# Patient Record
Sex: Male | Born: 1965
Health system: Southern US, Community
[De-identification: ages and names within clinical notes are randomized; demographics above are authoritative.]

## PROBLEM LIST (undated history)

## (undated) ENCOUNTER — Emergency Department (HOSPITAL_COMMUNITY): Admission: EM | Payer: Federal, State, Local not specified - PPO | Source: Home / Self Care

## (undated) DIAGNOSIS — E785 Hyperlipidemia, unspecified: Secondary | ICD-10-CM

## (undated) DIAGNOSIS — G4730 Sleep apnea, unspecified: Secondary | ICD-10-CM

## (undated) DIAGNOSIS — N529 Male erectile dysfunction, unspecified: Secondary | ICD-10-CM

## (undated) DIAGNOSIS — I10 Essential (primary) hypertension: Secondary | ICD-10-CM

## (undated) DIAGNOSIS — M1990 Unspecified osteoarthritis, unspecified site: Secondary | ICD-10-CM

## (undated) HISTORY — PX: BACK SURGERY: SHX140

## (undated) HISTORY — PX: HERNIA REPAIR: SHX51

## (undated) HISTORY — DX: Hyperlipidemia, unspecified: E78.5

## (undated) HISTORY — PX: OTHER SURGICAL HISTORY: SHX169

## (undated) HISTORY — PX: LUMBAR MICRODISCECTOMY: SHX99

## (undated) HISTORY — DX: Sleep apnea, unspecified: G47.30

## (undated) HISTORY — PX: POLYPECTOMY: SHX149

## (undated) HISTORY — PX: COLONOSCOPY: SHX174

## (undated) HISTORY — DX: Male erectile dysfunction, unspecified: N52.9

## (undated) HISTORY — PX: UMBILICAL HERNIA REPAIR: SHX196

## (undated) HISTORY — DX: Essential (primary) hypertension: I10

## (undated) HISTORY — PX: FRACTURE SURGERY: SHX138

---

## 1999-01-31 ENCOUNTER — Emergency Department (HOSPITAL_COMMUNITY): Admission: EM | Admit: 1999-01-31 | Discharge: 1999-01-31 | Payer: Self-pay | Admitting: Emergency Medicine

## 1999-05-27 ENCOUNTER — Encounter: Payer: Self-pay | Admitting: Neurosurgery

## 1999-05-27 ENCOUNTER — Ambulatory Visit (HOSPITAL_COMMUNITY): Admission: RE | Admit: 1999-05-27 | Discharge: 1999-05-27 | Payer: Self-pay | Admitting: Neurosurgery

## 2000-01-10 ENCOUNTER — Emergency Department (HOSPITAL_COMMUNITY): Admission: EM | Admit: 2000-01-10 | Discharge: 2000-01-10 | Payer: Self-pay | Admitting: Internal Medicine

## 2000-04-16 ENCOUNTER — Encounter (INDEPENDENT_AMBULATORY_CARE_PROVIDER_SITE_OTHER): Payer: Self-pay

## 2000-04-16 ENCOUNTER — Other Ambulatory Visit: Admission: RE | Admit: 2000-04-16 | Discharge: 2000-04-16 | Payer: Self-pay | Admitting: Urology

## 2000-05-11 ENCOUNTER — Emergency Department (HOSPITAL_COMMUNITY): Admission: EM | Admit: 2000-05-11 | Discharge: 2000-05-11 | Payer: Self-pay | Admitting: Emergency Medicine

## 2001-07-04 ENCOUNTER — Encounter: Admission: RE | Admit: 2001-07-04 | Discharge: 2001-07-04 | Payer: Self-pay | Admitting: Orthopaedic Surgery

## 2001-07-18 ENCOUNTER — Encounter: Admission: RE | Admit: 2001-07-18 | Discharge: 2001-07-18 | Payer: Self-pay | Admitting: Orthopaedic Surgery

## 2001-09-30 ENCOUNTER — Encounter: Payer: Self-pay | Admitting: Emergency Medicine

## 2001-09-30 ENCOUNTER — Emergency Department (HOSPITAL_COMMUNITY): Admission: EM | Admit: 2001-09-30 | Discharge: 2001-10-01 | Payer: Self-pay | Admitting: Emergency Medicine

## 2003-08-09 ENCOUNTER — Emergency Department (HOSPITAL_COMMUNITY): Admission: AD | Admit: 2003-08-09 | Discharge: 2003-08-09 | Payer: Self-pay | Admitting: Family Medicine

## 2003-08-23 ENCOUNTER — Emergency Department (HOSPITAL_COMMUNITY): Admission: AD | Admit: 2003-08-23 | Discharge: 2003-08-23 | Payer: Self-pay | Admitting: Family Medicine

## 2004-04-04 ENCOUNTER — Inpatient Hospital Stay (HOSPITAL_COMMUNITY): Admission: RE | Admit: 2004-04-04 | Discharge: 2004-04-07 | Payer: Self-pay | Admitting: Orthopaedic Surgery

## 2004-06-01 HISTORY — PX: KNEE ARTHROSCOPY: SHX127

## 2004-09-09 ENCOUNTER — Ambulatory Visit (HOSPITAL_BASED_OUTPATIENT_CLINIC_OR_DEPARTMENT_OTHER): Admission: RE | Admit: 2004-09-09 | Discharge: 2004-09-09 | Payer: Self-pay | Admitting: Otolaryngology

## 2004-09-14 ENCOUNTER — Ambulatory Visit: Payer: Self-pay | Admitting: Internal Medicine

## 2005-03-21 ENCOUNTER — Emergency Department (HOSPITAL_COMMUNITY): Admission: EM | Admit: 2005-03-21 | Discharge: 2005-03-22 | Payer: Self-pay | Admitting: Emergency Medicine

## 2008-10-01 ENCOUNTER — Encounter: Admission: RE | Admit: 2008-10-01 | Discharge: 2008-10-01 | Payer: Self-pay | Admitting: Family Medicine

## 2009-03-04 ENCOUNTER — Ambulatory Visit: Payer: Self-pay | Admitting: Family Medicine

## 2010-06-04 ENCOUNTER — Ambulatory Visit
Admission: RE | Admit: 2010-06-04 | Discharge: 2010-06-04 | Payer: Self-pay | Source: Home / Self Care | Attending: Family Medicine | Admitting: Family Medicine

## 2010-06-22 ENCOUNTER — Encounter: Payer: Self-pay | Admitting: Family Medicine

## 2010-10-17 NOTE — Op Note (Signed)
Corey [name], Corey [name]                ACCOUNT NO.:  [id number]   MEDICAL RECORD NO.:  [id number]          PATIENT TYPE:  INP   LOCATION:  5038                         FACILITY:  [facility]   PHYSICIAN:  Mark C. Ophelia Charter, M.D.    DATE OF BIRTH:  [birth year]   DATE OF PROCEDURE:  04/04/2004  DATE OF DISCHARGE:                                 OPERATIVE REPORT   PREOPERATIVE DIAGNOSIS:  Recurrent left L5-S1 herniated nucleus pulposus  with spondylosis.   POSTOPERATIVE DIAGNOSIS:  Recurrent left L5-S1 herniated nucleus pulposus  with spondylosis.   OPERATIVE PROCEDURES:  L5-S1 recurrent microdiskectomy, bilateral  foraminotomy, laminectomy, and decompression;  left transverse lumbar  interbody fusion with 10-mm spacer, autograft iliac crest, allograft, and  pedicle screw fixation.   SURGEON:  Mark C. Ophelia Charter, M.D.   ASSISTANT:  __________, P.A.-C.   ANESTHESIA:  GOT.   ESTIMATED BLOOD LOSS:  1000 cc.   PROCEDURE:  After the induction of general anesthesia, the patient was  placed prone on chest rolls.  Preoperative Ancef was given at the beginning  of the case and was redosed at the end of the case after five hours of  surgery.  The back was prepped with DuraPrep.  The area was covered with  towels.  A laminectomy sheet was applied after Betadine and a Vi-Drape.  A  midline incision was made incorporating the old scar from the previous HNP.  The patient had a disk space that was labeled S1-S2, and this was then disk  space below the iliac crest on the lateral where the patient had previous  disk herniation.  Subperiosteal dissection was used, and once x-rays were  taken, cross-table lateral, confirming the appropriate level, decompression  was performed. The dura was carefully protected.  There were moderate-to-  severe adhesions in the lateral gutter and anteriorly.  There were disk  fragments present which appeared old and were fibrosed to the dura.  The  dura was separated away.  Bone was  removed at the level of the pedicles  bilaterally.  Nerve root above was identified and followed out the foramina  excising a portion of the facet to expose the lateral disk.  The nerve root  retractor was used.  The operating microscope was used for the visualization  with use of the 8, 9, and then 10-mm spinners, angled curettes, straight  curettes, multiple size pituitaries, wide, with up and downbiters.  Anterior  annulus was intact.  Once the trial was able to be inserted, a 10, and  checked with the cross-table lateral fluoroscopy, the right iliac crest was  exposed through a separate incision, and 10-mm bone was harvested,  tricortical, trimmed down slightly, and then impacted placed anteriorly.  The spacer was then packed with some Symphony bone graft which had been  mixed with patient's autograft from the lamina and partial facet removal.  Once the bone was intermixed, it was packed into the spacer.  The spacer was  then inserted dorsal to the large piece of bone.  Patties were used to  carefully protect the dura, and nerve root was  intact after the spacer had  been introduced.  Hemostasis was maintained.  Pedicle screws were then  inserted checking AP and lateral fluoroscopy for appropriate position.  On  the right lateral gutter, transverse processes were freshened with the  rongeur. Bone on the upsweep coming up to the lateral aspect of the pedicle  was freshened up, and then the log of bone graft was packed and mixed, stuck  down laterally, with the bone chips then packed in behind it tightly with  bone impactor.  After repeat irrigation, care was taken to make sure there  was no bone graft in the midline on the dura.  Several small pieces were  removed.  Fascia was then closed with 0 Vicryl.  A Hemovac was placed  through a separate stab incision draining both the iliac crest wound and  also the midline wound subcutaneously.  Then ,2-0 Vicryl was used in the  subcutaneous  tissue.  Iliac crest fascia was closed with 0 Vicryl and 2-0  Vicryl, and the skin with stapled closure.  Marcaine was infiltrated into  the skin.  A postoperative dressing was applied.  The patient was  neurologically intact in the recovery room.  The patient was hemodynamically  stable.       MCY/MEDQ  D:  04/04/2004  T:  04/05/2004  Job:  [id number]

## 2010-10-17 NOTE — Discharge Summary (Signed)
Corey [name], Corey [name]                ACCOUNT NO.:  [id number]   MEDICAL RECORD NO.:  [id number]          PATIENT TYPE:  INP   LOCATION:  5038                         FACILITY:  [facility]   PHYSICIAN:  Sandrea Matte, P.A.    DATE OF BIRTH:  Feb 12, 1966   DATE OF ADMISSION:  04/04/2004  DATE OF DISCHARGE:  04/07/2004                                 DISCHARGE SUMMARY   ADMISSION DIAGNOSIS:  Recurrent L5-S1 HNP.   HISTORY OF PRESENT ILLNESS:  [name] is a 45 year old male with prolonged  significant history of lower back pain.  He underwent a left L5-S1  microdiscectomy back in July 1998.  He has been well after the surgery until  recently.  He started to experience increasing bilateral lower extremity  pain and paresthesias.  He is status post epidural steroid injections  without any significant relief.  Due to the recurrence of his H&P, an L5-S1  decompression left microdiscectomy and TLIF procedure were talked about and  agreed upon.   SPECIAL PROCEDURE:  The patient underwent an L5-S1 decompression left  microdiscectomy TLIF procedure with pedicle screws, iliac crest bone  grafting and allograft on April 04, 2004.  Surgeon Annell Greening, M.D.,  assistant Sandrea Matte, P.A.-C.  The patient did tolerated this procedure  well.  There were no complications.  Please see Op Note for findings.   HOSPITAL COURSE:  The patient was admitted April 04, 2004, and underwent  an L5-S1 decompression left microdiscectomy and TLIF procedure.  This was  tolerated well.  Please see Op Note for findings.  On April 05, 2004, the  patient denied any weakness down his legs and states that the radicular  symptoms had improved.  The patient was complaining of nausea and vomiting  overnight and was given Zofran.  Labs today were as follows:  Hemoglobin  11.3, hematocrit 33.5.  Physical therapy was started today.  The patient was  helped out of bed to chair.  On April 06, 2004, the patient's nausea had  improved.  The patient states he was feeling a lot more comfortable today  and had been ambulating some with assistance.  Labs were hemoglobin 10.6,  hematocrit 31.3.  Dressing has been changed.  He did have some minimal  drainage from his incision.  His Hemovac drain was discontinued.  The  patient's lumbar corset was applied.  On April 07, 2004, the patient had  progressed very well.  He had been walking in therapy, has done stairs, and  ambulates well with a walker.  Therapy has discharged him from service due  to his success.  The patient states his pain was controlled well.  His  nausea had improved.  His leg pain had also improved.  Last hemoglobin 9.6,  hematocrit 28.6.  The patient was discharged home today in stable condition.   DISCHARGE MEDICATIONS:  1.  OxyContin 20 mg p.o. q.12h.  2.  Tylox 1-2 tablets p.o. q.4-6h. p.r.n.  3.  Phenergan 25 mg p.o. q.6h. p.r.n. nausea.  4.  Colace 100 mg p.o. b.i.d.  5.  Robaxin 500 mg one tablet  p.o. q.6-8h. p.r.n. spasms.   DISCHARGE INSTRUCTIONS:  The patient will be going home with a rolling  walker with 5 inch wheels.  He is to wear his corset while at home, keep his  incision dry.  He is to keep walking daily, avoid any heavy lifting,  bending, stooping and prolonged periods of sitting.  He will follow up with  Dr. Ophelia Charter in one week.   CONDITION ON DISCHARGE:  Stable.   DISPOSITION:  To home.   DISCHARGE DIAGNOSES:  Status post L5-S1decompression left microdiscectomy  and TLIF procedure using pedicle screws, iliac crest bone grafting and  Allograft.       JH/MEDQ  D:  05/19/2004  T:  05/20/2004  Job:  [id number]

## 2010-10-17 NOTE — Procedures (Signed)
[name], Corey [name]                ACCOUNT NO.:  [id number]   MEDICAL RECORD NO.:  [id number]          PATIENT TYPE:  OUT   LOCATION:  SLEEP CENTER                 FACILITY:  [facility]   PHYSICIAN:  Clinton D. Maple Hudson, M.D. DATE OF BIRTH:  13-Dec-1965   DATE OF STUDY:  09/09/2004                              NOCTURNAL POLYSOMNOGRAM   REFERRING PHYSICIAN:  Cristal Deer E. Ezzard Standing, M.D.   INDICATIONS FOR STUDY:  Hypersomnia with sleep apnea. Epworth sleepiness  score 12/24, BMI 30, weight 205 pounds.   SLEEP ARCHITECTURE:  Total sleep time 356 minutes with sleep efficiency of  94%. Stage I was 8%, stage II was 67%, stages III and IV were 7%, REM was  18% of total sleep time. Sleep latency was 9 minutes. REM latency 53  minutes. Awake after sleep onset 14 minutes. Arousal index 21. Medications  at bedtime included Simply Sleep, ibuprofen, and Darvocet.   RESPIRATORY DATA:  Respiratory disturbance index (RDI, AHI) 10.8 obstructive  events per hour indicating mild obstructive sleep apnea/hypopnea syndrome.  This included 2 central apneas, 2 obstructive apneas, and 60 hypopneas.  Events were not positional. REM RDI 28.  He had insufficient early events to  prevent use of CPAP titration by protocol on this study night.   OXYGEN DATA:  Moderate snoring with oxygen desaturation to a nadir of 90%.  Mean oxygen saturation through the study was 98% on room air.   CARDIAC DATA:  Normal sinus rhythm.   MOVEMENT/PARASOMNIA:  A total of limb jerks were recorded of which 15 were  associated with arousal or awakening for periodic limb movement with arousal  index of 2.5 per hour which was increased.   IMPRESSION/RECOMMENDATIONS:  1.  Mild obstructive sleep apnea/hypopnea syndrome, RDI 10.2 per hour with      moderate snoring and oxygen desaturation to 90%.  2.  Mild periodic limb movement with arousal, 2.5 per hour.  3.  CPAP titration can be considered if there is clinical evidence of      daytime  hypersomnia, hypertension, or other symptoms directly      attributable to sleep apnea. Can also consider alternative therapeutic      options.      CDY/MEDQ  D:  09/14/2004 16:11:23  T:  09/14/2004 20:35:07  Job:  [id number]

## 2010-12-25 ENCOUNTER — Other Ambulatory Visit: Payer: Self-pay | Admitting: Family Medicine

## 2010-12-25 NOTE — Telephone Encounter (Signed)
DR.LALONDE ALREADY RENEWED THIS

## 2010-12-25 NOTE — Telephone Encounter (Signed)
Is this ok?

## 2011-01-19 ENCOUNTER — Encounter: Payer: Self-pay | Admitting: Family Medicine

## 2011-01-21 ENCOUNTER — Encounter: Payer: Federal, State, Local not specified - PPO | Admitting: Family Medicine

## 2011-06-25 ENCOUNTER — Other Ambulatory Visit: Payer: Self-pay | Admitting: Family Medicine

## 2011-06-25 NOTE — Telephone Encounter (Signed)
Pt needs to call office and schedule appt for further refills.

## 2011-07-27 ENCOUNTER — Other Ambulatory Visit: Payer: Self-pay | Admitting: Family Medicine

## 2011-07-27 NOTE — Telephone Encounter (Signed)
Is this ok?

## 2011-08-19 ENCOUNTER — Encounter: Payer: Self-pay | Admitting: Family Medicine

## 2011-08-19 ENCOUNTER — Ambulatory Visit (INDEPENDENT_AMBULATORY_CARE_PROVIDER_SITE_OTHER): Payer: Federal, State, Local not specified - PPO | Admitting: Family Medicine

## 2011-08-19 VITALS — BP 120/84 | HR 98 | Ht 69.0 in | Wt 201.0 lb

## 2011-08-19 DIAGNOSIS — Z0000 Encounter for general adult medical examination without abnormal findings: Secondary | ICD-10-CM

## 2011-08-19 DIAGNOSIS — F524 Premature ejaculation: Secondary | ICD-10-CM

## 2011-08-19 LAB — COMPREHENSIVE METABOLIC PANEL
ALT: 24 U/L (ref 0–53)
Alkaline Phosphatase: 58 U/L (ref 39–117)
BUN: 15 mg/dL (ref 6–23)
CO2: 25 mEq/L (ref 19–32)
Calcium: 9.2 mg/dL (ref 8.4–10.5)
Chloride: 103 mEq/L (ref 96–112)
Creat: 1.33 mg/dL (ref 0.50–1.35)
Total Bilirubin: 0.6 mg/dL (ref 0.3–1.2)

## 2011-08-19 LAB — CBC WITH DIFFERENTIAL/PLATELET
Basophils Relative: 0 % (ref 0–1)
Eosinophils Absolute: 0.2 10*3/uL (ref 0.0–0.7)
Eosinophils Relative: 3 % (ref 0–5)
Hemoglobin: 14 g/dL (ref 13.0–17.0)
Lymphocytes Relative: 48 % — ABNORMAL HIGH (ref 12–46)
Lymphs Abs: 3.3 10*3/uL (ref 0.7–4.0)
MCH: 27.7 pg (ref 26.0–34.0)
MCHC: 32 g/dL (ref 30.0–36.0)
MCV: 86.5 fL (ref 78.0–100.0)
Monocytes Absolute: 0.3 10*3/uL (ref 0.1–1.0)
Monocytes Relative: 4 % (ref 3–12)
Neutrophils Relative %: 45 % (ref 43–77)
Platelets: 287 10*3/uL (ref 150–400)
RBC: 5.05 MIL/uL (ref 4.22–5.81)
RDW: 14.2 % (ref 11.5–15.5)
WBC: 7 10*3/uL (ref 4.0–10.5)

## 2011-08-19 LAB — HEMOCCULT GUIAC POC 1CARD (OFFICE)

## 2011-08-19 LAB — LIPID PANEL
Cholesterol: 203 mg/dL — ABNORMAL HIGH (ref 0–200)
Total CHOL/HDL Ratio: 4.3 Ratio
Triglycerides: 81 mg/dL (ref <150)
VLDL: 16 mg/dL (ref 0–40)

## 2011-08-19 MED ORDER — PAROXETINE HCL 20 MG PO TABS: 10.0000 mg | ORAL_TABLET | ORAL | Status: DC

## 2011-08-19 NOTE — Progress Notes (Signed)
  Subjective:    Patient ID: Corey [name], male    DOB: [birth year], 46 y.o.   MRN: [id number]  HPI He is here for complete examination. He does have intermittent difficulty with knee pain but is able to continue to work. Occasionally does swell. He also continues to use Paxil for premature ejaculation. Otherwise he has no particular concerns or complaints. He is not married and presently is dating one person. His social and family history were reviewed.   Review of Systems  Constitutional: Negative.   HENT: Negative.   Eyes: Negative.   Respiratory: Negative.   Cardiovascular: Negative.   Gastrointestinal: Negative.   Genitourinary: Negative.   Musculoskeletal: Negative.   Skin: Negative.   Neurological: Negative.   Hematological: Negative.   Psychiatric/Behavioral: Negative.        Objective:   Physical Exam BP 120/84  Pulse 98  Ht 5\' 9"  (1.753 m)  Wt 201 lb (91.173 kg)  BMI 29.68 kg/m2  General Appearance:    Alert, cooperative, no distress, appears stated age  Head:    Normocephalic, without obvious abnormality, atraumatic  Eyes:    PERRL, conjunctiva/corneas clear, EOM's intact, fundi    benign  Ears:    Normal TM's and external ear canals  Nose:   Nares normal, mucosa normal, no drainage or sinus   tenderness  Throat:   Lips, mucosa, and tongue normal; teeth and gums normal  Neck:   Supple, no lymphadenopathy;  thyroid:  no   enlargement/tenderness/nodules; no carotid   bruit or JVD  Back:    Spine nontender, no curvature, ROM normal, no CVA     tenderness  Lungs:     Clear to auscultation bilaterally without wheezes, rales or     ronchi; respirations unlabored  Chest Wall:    No tenderness or deformity   Heart:    Regular rate and rhythm, S1 and S2 normal, no murmur, rub   or gallop  Breast Exam:    No chest wall tenderness, masses or gynecomastia  Abdomen:     Soft, non-tender, nondistended, normoactive bowel sounds,    no masses, no hepatosplenomegaly    Genitalia:    Normal male external genitalia without lesions.  Testicles without masses.  No inguinal hernias.  Rectal:    Normal sphincter tone, no masses or tenderness; guaiac negative stool.  Prostate smooth, no nodules, not enlarged.  Extremities:   No clubbing, cyanosis or edema  Pulses:   2+ and symmetric all extremities  Skin:   Skin color, texture, turgor normal, no rashes or lesions  Lymph nodes:   Cervical, supraclavicular, and axillary nodes normal  Neurologic:   CNII-XII intact, normal strength, sensation and gait; reflexes 2+ and symmetric throughout          Psych:   Normal mood, affect, hygiene and grooming.           Assessment & Plan:   1. Routine general medical examination at a health care facility  CBC with Differential, Comprehensive metabolic panel, Lipid panel, Hemoccult - 1 Card (office)  2. Premature ejaculation  PARoxetine (PAXIL) 20 MG tablet   Recommend he use Paxil on an as needed basis rather than on a daily basis.

## 2011-08-20 NOTE — Progress Notes (Signed)
Quick Note:  The blood work is normal ______ 

## 2012-08-22 ENCOUNTER — Other Ambulatory Visit: Payer: Self-pay | Admitting: Family Medicine

## 2012-08-26 ENCOUNTER — Ambulatory Visit (INDEPENDENT_AMBULATORY_CARE_PROVIDER_SITE_OTHER): Payer: Federal, State, Local not specified - PPO | Admitting: Family Medicine

## 2012-08-26 ENCOUNTER — Encounter: Payer: Self-pay | Admitting: Family Medicine

## 2012-08-26 VITALS — BP 110/70 | HR 60 | Ht 68.5 in | Wt 197.0 lb

## 2012-08-26 DIAGNOSIS — Z0000 Encounter for general adult medical examination without abnormal findings: Secondary | ICD-10-CM

## 2012-08-26 DIAGNOSIS — F524 Premature ejaculation: Secondary | ICD-10-CM

## 2012-08-26 LAB — POCT URINALYSIS DIPSTICK
Bilirubin, UA: NEGATIVE
Glucose, UA: NEGATIVE
Leukocytes, UA: NEGATIVE
Nitrite, UA: NEGATIVE

## 2012-08-26 LAB — HEMOCCULT GUIAC POC 1CARD (OFFICE)

## 2012-08-26 MED ORDER — PAROXETINE HCL 20 MG PO TABS: ORAL_TABLET | ORAL | Status: DC

## 2012-08-26 NOTE — Progress Notes (Signed)
  Subjective:    Patient ID: Corey [name], male    DOB: 29-Mar-1966, 47 y.o.   MRN: [id number]  HPI He is here for complete examination. He has no particular concerns or complaints. His past medical history is negative except for back surgery. He is having no difficulty with that. His work is going well. He is involved in a relationship but is not sexually active. He does have a history of premature ejaculation and uses Paxil for this.Social and family history were reviewed. He does exercise regularly.   Review of Systems  Constitutional: Negative.   HENT: Negative.   Respiratory: Negative.   Cardiovascular: Negative.   Endocrine: Negative.   Genitourinary: Negative.   Allergic/Immunologic: Negative.   Neurological: Negative.   Psychiatric/Behavioral: Negative.        Objective:   Physical Exam BP 110/70  Pulse 60  Ht 5' 8.5" (1.74 m)  Wt 197 lb (89.359 kg)  BMI 29.51 kg/m2  General Appearance:    Alert, cooperative, no distress, appears stated age  Head:    Normocephalic, without obvious abnormality, atraumatic  Eyes:    PERRL, conjunctiva/corneas clear, EOM's intact, fundi    benign  Ears:    Normal TM's and external ear canals  Nose:   Nares normal, mucosa normal, no drainage or sinus   tenderness  Throat:   Lips, mucosa, and tongue normal; teeth and gums normal  Neck:   Supple, no lymphadenopathy;  thyroid:  no   enlargement/tenderness/nodules; no carotid   bruit or JVD  Back:    Spine nontender, no curvature, ROM normal, no CVA     tenderness  Lungs:     Clear to auscultation bilaterally without wheezes, rales or     ronchi; respirations unlabored  Chest Wall:    No tenderness or deformity   Heart:    Regular rate and rhythm, S1 and S2 normal, no murmur, rub   or gallop  Breast Exam:    No chest wall tenderness, masses or gynecomastia  Abdomen:     Soft, non-tender, nondistended, normoactive bowel sounds,    no masses, no hepatosplenomegaly  Genitalia:    Normal  male external genitalia without lesions.  Testicles without masses.  No inguinal hernias.  Rectal:    Normal sphincter tone, no masses or tenderness; guaiac negative stool.  Prostate smooth, no nodules, not enlarged.  Extremities:   No clubbing, cyanosis or edema  Pulses:   2+ and symmetric all extremities  Skin:   Skin color, texture, turgor normal, no rashes or lesions.surgical scar noted in the lower lumbar area.  Lymph nodes:   Cervical, supraclavicular, and axillary nodes normal  Neurologic:   CNII-XII intact, normal strength, sensation and gait; reflexes 2+ and symmetric throughout          Psych:   Normal mood, affect, hygiene and grooming.          Assessment & Plan:  Routine general medical examination at a health care facility - Plan: POCT Urinalysis Dipstick, Lipid panel, Hemoccult - 1 Card (office)  Premature ejaculation - Plan: PARoxetine (PAXIL) 20 MG tablet encouraged him to continue to take good care of himself. Discussed his height and weight. Discussed the fact that it would probably be good for him to lose a couple of inches in his waist.

## 2012-08-26 NOTE — Progress Notes (Signed)
  Subjective:    Patient ID: Corey [name], male    DOB: 04-24-1966, 47 y.o.   MRN: [id number]  HPI    Review of Systems     Objective:   Physical Exam        Assessment & Plan:  Urine microscopic examination was negative

## 2012-08-27 LAB — LIPID PANEL
Cholesterol: 146 mg/dL (ref 0–200)
HDL: 40 mg/dL (ref >39)
LDL Cholesterol: 100 mg/dL — ABNORMAL HIGH (ref 0–99)
Total CHOL/HDL Ratio: 3.7 Ratio
Triglycerides: 31 mg/dL (ref <150)

## 2012-08-29 NOTE — Progress Notes (Signed)
Quick Note:  CALLED PT HOME # PT INFORMED LIPIDS LOOK GOOD ______

## 2013-08-28 ENCOUNTER — Ambulatory Visit (INDEPENDENT_AMBULATORY_CARE_PROVIDER_SITE_OTHER): Payer: Federal, State, Local not specified - PPO | Admitting: Family Medicine

## 2013-08-28 ENCOUNTER — Encounter: Payer: Self-pay | Admitting: Family Medicine

## 2013-08-28 VITALS — BP 124/80 | HR 68 | Ht 69.0 in | Wt 209.0 lb

## 2013-08-28 DIAGNOSIS — F524 Premature ejaculation: Secondary | ICD-10-CM

## 2013-08-28 DIAGNOSIS — Z209 Contact with and (suspected) exposure to unspecified communicable disease: Secondary | ICD-10-CM

## 2013-08-28 DIAGNOSIS — Z0000 Encounter for general adult medical examination without abnormal findings: Secondary | ICD-10-CM

## 2013-08-28 LAB — POCT URINALYSIS DIPSTICK
Bilirubin, UA: NEGATIVE
Blood, UA: NEGATIVE
GLUCOSE UA: NEGATIVE
KETONES UA: NEGATIVE
Leukocytes, UA: NEGATIVE
NITRITE UA: NEGATIVE
PH UA: 5
Protein, UA: NEGATIVE
Spec Grav, UA: 1.01
Urobilinogen, UA: NEGATIVE

## 2013-08-28 LAB — HEMOCCULT GUIAC POC 1CARD (OFFICE)

## 2013-08-28 LAB — CBC WITH DIFFERENTIAL/PLATELET
BASOS ABS: 0 10*3/uL (ref 0.0–0.1)
Basophils Relative: 1 % (ref 0–1)
Eosinophils Absolute: 0.2 10*3/uL (ref 0.0–0.7)
Eosinophils Relative: 5 % (ref 0–5)
HCT: 42.5 % (ref 39.0–52.0)
Hemoglobin: 13.8 g/dL (ref 13.0–17.0)
Lymphocytes Relative: 41 % (ref 12–46)
Lymphs Abs: 1.9 10*3/uL (ref 0.7–4.0)
MCH: 27.3 pg (ref 26.0–34.0)
MCHC: 32.5 g/dL (ref 30.0–36.0)
MCV: 84 fL (ref 78.0–100.0)
Monocytes Absolute: 0.2 10*3/uL (ref 0.1–1.0)
Monocytes Relative: 5 % (ref 3–12)
NEUTROS ABS: 2.2 10*3/uL (ref 1.7–7.7)
Neutrophils Relative %: 48 % (ref 43–77)
Platelets: 290 10*3/uL (ref 150–400)
RBC: 5.06 MIL/uL (ref 4.22–5.81)
RDW: 15 % (ref 11.5–15.5)
WBC: 4.6 10*3/uL (ref 4.0–10.5)

## 2013-08-28 MED ORDER — PAROXETINE HCL 20 MG PO TABS: ORAL_TABLET | ORAL | Status: DC

## 2013-08-28 NOTE — Progress Notes (Signed)
   Subjective:    Patient ID: Corey [name], male    DOB: 1965-12-22, 48 y.o.   MRN: [id number]  HPI He is here for complete examination. He has no particular concerns or complaints. He would like a refill on his piroxicam for treatment of premature ejaculation. He is dating someone and does use condoms but would like to be tested be safe. He works 6 or 7 days per week and does have a physically active job. He is fasting right now due to religious reasons. His weight was reviewed. Family and social history were reviewed.   Review of Systems  All other systems reviewed and are negative.       Objective:   Physical Exam BP 124/80  Pulse 68  Ht 5\' 9"  (1.753 m)  Wt 209 lb (94.802 kg)  BMI 30.85 kg/m2  General Appearance:    Alert, cooperative, no distress, appears stated age  Head:    Normocephalic, without obvious abnormality, atraumatic  Eyes:    PERRL, conjunctiva/corneas clear, EOM's intact, fundi    benign  Ears:    Normal TM's and external ear canals  Nose:   Nares normal, mucosa normal, no drainage or sinus   tenderness  Throat:   Lips, mucosa, and tongue normal; teeth and gums normal  Neck:   Supple, no lymphadenopathy;  thyroid:  no   enlargement/tenderness/nodules; no carotid   bruit or JVD  Back:    Spine nontender, no curvature, ROM normal, no CVA     tenderness  Lungs:     Clear to auscultation bilaterally without wheezes, rales or     ronchi; respirations unlabored  Chest Wall:    No tenderness or deformity   Heart:    Regular rate and rhythm, S1 and S2 normal, no murmur, rub   or gallop  Breast Exam:    No chest wall tenderness, masses or gynecomastia  Abdomen:     Soft, non-tender, nondistended, normoactive bowel sounds,    no masses, no hepatosplenomegaly  Genitalia:    Normal male external genitalia without lesions.  Testicles without masses.  No inguinal hernias.  Rectal:    Normal sphincter tone, no masses or tenderness; guaiac negative stool.  Prostate  smooth, no nodules, not enlarged.  Extremities:   No clubbing, cyanosis or edema  Pulses:   2+ and symmetric all extremities  Skin:   Skin color, texture, turgor normal, no rashes or lesions  Lymph nodes:   Cervical, supraclavicular, and axillary nodes normal  Neurologic:   CNII-XII intact, normal strength, sensation and gait; reflexes 2+ and symmetric throughout          Psych:   Normal mood, affect, hygiene and grooming.          Assessment & Plan:  Routine general medical examination at a health care facility - Plan: Urinalysis Dipstick, CBC with Differential, Comprehensive metabolic panel, Lipid panel, POCT occult blood stool  Premature ejaculation - Plan: PARoxetine (PAXIL) 20 MG tablet  Contact with or exposure to unspecified communicable disease - Plan: RPR, HIV antibody, GC/chlamydia probe amp, urine I encouraged him to cut back on carbohydrates. His job keeps him physically active.

## 2013-08-28 NOTE — Patient Instructions (Signed)
Cut back on carbs. The easiest way to remember it is if its white, you need to back off

## 2013-08-29 LAB — COMPREHENSIVE METABOLIC PANEL
ALBUMIN: 3.9 g/dL (ref 3.5–5.2)
ALT: 24 U/L (ref 0–53)
AST: 25 U/L (ref 0–37)
Alkaline Phosphatase: 59 U/L (ref 39–117)
BUN: 23 mg/dL (ref 6–23)
CALCIUM: 8.8 mg/dL (ref 8.4–10.5)
CO2: 24 mEq/L (ref 19–32)
Chloride: 104 mEq/L (ref 96–112)
Creat: 1.21 mg/dL (ref 0.50–1.35)
Glucose, Bld: 92 mg/dL (ref 70–99)
POTASSIUM: 4.4 meq/L (ref 3.5–5.3)
Sodium: 139 mEq/L (ref 135–145)
Total Bilirubin: 0.3 mg/dL (ref 0.2–1.2)
Total Protein: 7.1 g/dL (ref 6.0–8.3)

## 2013-08-29 LAB — LIPID PANEL
CHOLESTEROL: 178 mg/dL (ref 0–200)
HDL: 57 mg/dL (ref >39)
LDL Cholesterol: 108 mg/dL — ABNORMAL HIGH (ref 0–99)
Total CHOL/HDL Ratio: 3.1 Ratio
Triglycerides: 66 mg/dL (ref <150)
VLDL: 13 mg/dL (ref 0–40)

## 2013-08-29 LAB — GC/CHLAMYDIA PROBE AMP, URINE
CHLAMYDIA, SWAB/URINE, PCR: NEGATIVE
GC PROBE AMP, URINE: NEGATIVE

## 2013-08-29 LAB — HIV ANTIBODY (ROUTINE TESTING W REFLEX): HIV: NONREACTIVE

## 2013-08-29 LAB — RPR

## 2014-01-16 ENCOUNTER — Telehealth: Payer: Self-pay | Admitting: Internal Medicine

## 2014-01-16 NOTE — Telephone Encounter (Signed)
Faxed over medical records to [facility] on August 14th @ [phone]

## 2014-07-24 ENCOUNTER — Other Ambulatory Visit: Payer: Self-pay | Admitting: Family Medicine

## 2014-07-24 NOTE — Telephone Encounter (Signed)
Is this okay?

## 2014-08-30 ENCOUNTER — Encounter: Payer: Federal, State, Local not specified - PPO | Admitting: Family Medicine

## 2014-09-07 ENCOUNTER — Encounter: Payer: Self-pay | Admitting: Family Medicine

## 2014-09-07 ENCOUNTER — Ambulatory Visit (INDEPENDENT_AMBULATORY_CARE_PROVIDER_SITE_OTHER): Payer: 59 | Admitting: Family Medicine

## 2014-09-07 VITALS — BP 140/90 | HR 81 | Ht 69.0 in | Wt 225.6 lb

## 2014-09-07 DIAGNOSIS — Z0000 Encounter for general adult medical examination without abnormal findings: Secondary | ICD-10-CM | POA: Diagnosis not present

## 2014-09-07 DIAGNOSIS — F524 Premature ejaculation: Secondary | ICD-10-CM | POA: Diagnosis not present

## 2014-09-07 DIAGNOSIS — IMO0001 Reserved for inherently not codable concepts without codable children: Secondary | ICD-10-CM

## 2014-09-07 DIAGNOSIS — E669 Obesity, unspecified: Secondary | ICD-10-CM

## 2014-09-07 DIAGNOSIS — R030 Elevated blood-pressure reading, without diagnosis of hypertension: Secondary | ICD-10-CM

## 2014-09-07 LAB — CBC WITH DIFFERENTIAL/PLATELET
Basophils Absolute: 0.1 10*3/uL (ref 0.0–0.1)
Basophils Relative: 1 % (ref 0–1)
Eosinophils Absolute: 0.2 10*3/uL (ref 0.0–0.7)
Eosinophils Relative: 3 % (ref 0–5)
HEMATOCRIT: 42.5 % (ref 39.0–52.0)
Hemoglobin: 13.9 g/dL (ref 13.0–17.0)
Lymphocytes Relative: 46 % (ref 12–46)
Lymphs Abs: 2.8 10*3/uL (ref 0.7–4.0)
MCH: 27.5 pg (ref 26.0–34.0)
MCHC: 32.7 g/dL (ref 30.0–36.0)
MCV: 84.2 fL (ref 78.0–100.0)
MONO ABS: 0.4 10*3/uL (ref 0.1–1.0)
MPV: 9.1 fL (ref 8.6–12.4)
Monocytes Relative: 7 % (ref 3–12)
NEUTROS ABS: 2.6 10*3/uL (ref 1.7–7.7)
NEUTROS PCT: 43 % (ref 43–77)
PLATELETS: 283 10*3/uL (ref 150–400)
RBC: 5.05 MIL/uL (ref 4.22–5.81)
RDW: 14.5 % (ref 11.5–15.5)
WBC: 6.1 10*3/uL (ref 4.0–10.5)

## 2014-09-07 LAB — COMPREHENSIVE METABOLIC PANEL
ALT: 22 U/L (ref 0–53)
AST: 20 U/L (ref 0–37)
Albumin: 4.2 g/dL (ref 3.5–5.2)
Alkaline Phosphatase: 53 U/L (ref 39–117)
BUN: 12 mg/dL (ref 6–23)
CO2: 29 mEq/L (ref 19–32)
Calcium: 9.5 mg/dL (ref 8.4–10.5)
Chloride: 101 mEq/L (ref 96–112)
Creat: 1.34 mg/dL (ref 0.50–1.35)
Glucose, Bld: 85 mg/dL (ref 70–99)
Potassium: 4 mEq/L (ref 3.5–5.3)
Sodium: 139 mEq/L (ref 135–145)
TOTAL PROTEIN: 7.4 g/dL (ref 6.0–8.3)
Total Bilirubin: 0.5 mg/dL (ref 0.2–1.2)

## 2014-09-07 LAB — LIPID PANEL
Cholesterol: 226 mg/dL — ABNORMAL HIGH (ref 0–200)
HDL: 39 mg/dL — ABNORMAL LOW (ref >=40)
LDL CALC: 152 mg/dL — AB (ref 0–99)
Total CHOL/HDL Ratio: 5.8 Ratio
Triglycerides: 173 mg/dL — ABNORMAL HIGH (ref <150)
VLDL: 35 mg/dL (ref 0–40)

## 2014-09-07 LAB — POCT URINALYSIS DIPSTICK
Bilirubin, UA: NEGATIVE
Blood, UA: NEGATIVE
Glucose, UA: NEGATIVE
Ketones, UA: NEGATIVE
Leukocytes, UA: NEGATIVE
Nitrite, UA: NEGATIVE
Protein, UA: NEGATIVE
Urobilinogen, UA: NEGATIVE
pH, UA: 6

## 2014-09-07 MED ORDER — PAROXETINE HCL 20 MG PO TABS: ORAL_TABLET | ORAL | Status: DC

## 2014-09-07 NOTE — Patient Instructions (Addendum)
150 minutes a week of something physical. Cut back on white food Get down to at least a waist size 36

## 2014-09-07 NOTE — Progress Notes (Signed)
   Subjective:    Patient ID: Corey [name], male    DOB: 1966-04-05, 49 y.o.   MRN: [id number]  HPI He is here for complete examination. He continues to have difficulty with premature ejaculation and would like to go to the full 20 mg dosing of the Paxil. He has noted an increase in his weight. He is now taking care of his mother with the help of a brother. She apparently is dealing with Alzheimer's disease and requires regular care. His weight has suffered because of this. He has no other concerns or complaints. He has no chest pain, abdominal distress, allergy symptoms. Family and social history was reviewed. His immunizations and health maintenance was also reviewed.   Review of Systems  All other systems reviewed and are negative.      Objective:   Physical Exam BP 140/90 mmHg  Pulse 81  Ht 5\' 9"  (1.753 m)  Wt 225 lb 9.6 oz (102.331 kg)  BMI 33.30 kg/m2  General Appearance:    Alert, cooperative, no distress, appears stated age  Head:    Normocephalic, without obvious abnormality, atraumatic  Eyes:    PERRL, conjunctiva/corneas clear, EOM's intact, fundi    benign  Ears:    Normal TM's and external ear canals  Nose:   Nares normal, mucosa normal, no drainage or sinus   tenderness  Throat:   Lips, mucosa, and tongue normal; teeth and gums normal  Neck:   Supple, no lymphadenopathy;  thyroid:  no   enlargement/tenderness/nodules; no carotid   bruit or JVD  Back:    Spine nontender, no curvature, ROM normal, no CVA     tenderness  Lungs:     Clear to auscultation bilaterally without wheezes, rales or     ronchi; respirations unlabored  Chest Wall:    No tenderness or deformity   Heart:    Regular rate and rhythm, S1 and S2 normal, no murmur, rub   or gallop  Breast Exam:    No chest wall tenderness, masses or gynecomastia  Abdomen:     Soft, non-tender, nondistended, normoactive bowel sounds,    no masses, no hepatosplenomegaly     Rectal:  deferred  Extremities:   No  clubbing, cyanosis or edema  Pulses:   2+ and symmetric all extremities  Skin:   Skin color, texture, turgor normal, no rashes or lesions  Lymph nodes:   Cervical, supraclavicular, and axillary nodes normal  Neurologic:   CNII-XII intact, normal strength, sensation and gait; reflexes 2+ and symmetric throughout          Psych:   Normal mood, affect, hygiene and grooming.          Assessment & Plan:  Routine general medical examination at a health care facility - Plan: POCT urinalysis dipstick, CBC with Differential/Platelet, Comprehensive metabolic panel, Lipid panel  Premature ejaculation - Plan: PARoxetine (PAXIL) 20 MG tablet  Obesity  Elevated blood pressure - Plan: CBC with Differential/Platelet, Comprehensive metabolic panel, Lipid panel discussed diet and exercise with him in regard to his blood pressure and his weight. Encouraged him to make further diet and exercise changes. Discussed cutting back on carbohydrates and increasing his physical activity to 150 minutes per week. He will also be given stool cards.

## 2014-09-20 ENCOUNTER — Other Ambulatory Visit (INDEPENDENT_AMBULATORY_CARE_PROVIDER_SITE_OTHER): Payer: 59

## 2014-09-20 DIAGNOSIS — Z0000 Encounter for general adult medical examination without abnormal findings: Secondary | ICD-10-CM

## 2014-09-20 DIAGNOSIS — R195 Other fecal abnormalities: Secondary | ICD-10-CM

## 2014-09-20 LAB — POC HEMOCCULT BLD/STL (HOME/3-CARD/SCREEN)
Card #2 Fecal Occult Blod, POC: NEGATIVE
Card #3 Fecal Occult Blood, POC: NEGATIVE
Fecal Occult Blood, POC: POSITIVE

## 2014-09-21 ENCOUNTER — Encounter: Payer: Self-pay | Admitting: Internal Medicine

## 2014-11-15 ENCOUNTER — Encounter (INDEPENDENT_AMBULATORY_CARE_PROVIDER_SITE_OTHER): Payer: Self-pay

## 2014-11-15 ENCOUNTER — Encounter: Payer: Self-pay | Admitting: Internal Medicine

## 2014-11-15 ENCOUNTER — Ambulatory Visit (INDEPENDENT_AMBULATORY_CARE_PROVIDER_SITE_OTHER): Payer: 59 | Admitting: Internal Medicine

## 2014-11-15 VITALS — BP 110/78 | HR 88 | Ht 67.75 in | Wt 231.0 lb

## 2014-11-15 DIAGNOSIS — R195 Other fecal abnormalities: Secondary | ICD-10-CM | POA: Diagnosis not present

## 2014-11-15 MED ORDER — NA SULFATE-K SULFATE-MG SULF 17.5-3.13-1.6 GM/177ML PO SOLN: ORAL | Status: DC

## 2014-11-15 NOTE — Patient Instructions (Signed)

## 2014-11-15 NOTE — Progress Notes (Signed)
Patient ID: Corey [name], male   DOB: 1966/05/13, 49 y.o.   MRN: [id number] HPI: Corey [name] is a 49 year old male with little past medical history who is seen in consultation at the request of Dr. Redmond School to evaluate heme positive stool. He is here alone today. He reports he is feeling well and denies specific GI complaint. He reports his bowel movements have been regular without diarrhea or constipation. He denies seeing visible blood with wiping or in his stool. No melena. Stable weight. Good appetite. Denies heartburn, dysphagia and odynophagia. No early satiety. Weight is stable to slightly increased. No hepatobiliary complaint. He denies a family history of GI tract malignancy, IBD and liver disease. He works for the [employer].  He had a recent annual physical and FOBT cards were returned one of which was positive  Past Medical History  Diagnosis Date  . Hyperlipidemia   . ED (erectile dysfunction)   . Anxiety     Past Surgical History  Procedure Laterality Date  . Lumbar microdiscectomy    . Knee arthroscopy Left 2006  . Umbilical hernia repair      Outpatient Prescriptions Prior to Visit  Medication Sig Dispense Refill  . Multiple Vitamins-Minerals (MULTIVITAMIN WITH MINERALS) tablet Take 1 tablet by mouth daily.    Marland Kitchen PARoxetine (PAXIL) 20 MG tablet TAKE 0.5 TABLETS (10 MG TOTAL) BY MOUTH EVERY MORNING. 90 tablet 3   No facility-administered medications prior to visit.    Allergies  Allergen Reactions  . Penicillins Other (See Comments)    Makes pt black out    Family History  Problem Relation Age of Onset  . Alzheimer's disease Mother   . Autism Brother   . Stevens-Johnson syndrome Father     History  Substance Use Topics  . Smoking status: Never Smoker   . Smokeless tobacco: Never Used  . Alcohol Use: 1.8 oz/week    3 Glasses of wine per week    ROS: As per history of present illness, otherwise negative  BP 110/78 mmHg  Pulse 88  Ht 5' 7.75" (1.721 m)   Wt 231 lb (104.781 kg)  BMI 35.38 kg/m2 Constitutional: Well-developed and well-nourished. No distress. HEENT: Normocephalic and atraumatic.  Conjunctivae are normal.  No scleral icterus. Neck: Neck supple. Trachea midline. Cardiovascular: Normal rate, regular rhythm and intact distal pulses. No M/R/G Pulmonary/chest: Effort normal and breath sounds normal. No wheezing, rales or rhonchi. Abdominal: Soft, nontender, nondistended. Bowel sounds active throughout. There are no masses palpable. No hepatosplenomegaly. Extremities: no clubbing, cyanosis, or edema Lymphadenopathy: No cervical adenopathy noted. Neurological: Alert and oriented to person place and time. Skin: Skin is warm and dry. No rashes noted. Psychiatric: Normal mood and affect. Behavior is normal.  RELEVANT LABS AND IMAGING: CBC    Component Value Date/Time   WBC 6.1 09/07/2014 0001   RBC 5.05 09/07/2014 0001   HGB 13.9 09/07/2014 0001   HCT 42.5 09/07/2014 0001   PLT 283 09/07/2014 0001   MCV 84.2 09/07/2014 0001   MCH 27.5 09/07/2014 0001   MCHC 32.7 09/07/2014 0001   RDW 14.5 09/07/2014 0001   LYMPHSABS 2.8 09/07/2014 0001   MONOABS 0.4 09/07/2014 0001   EOSABS 0.2 09/07/2014 0001   BASOSABS 0.1 09/07/2014 0001    CMP     Component Value Date/Time   NA 139 09/07/2014 0001   K 4.0 09/07/2014 0001   CL 101 09/07/2014 0001   CO2 29 09/07/2014 0001   GLUCOSE 85 09/07/2014 0001  BUN 12 09/07/2014 0001   CREATININE 1.34 09/07/2014 0001   CALCIUM 9.5 09/07/2014 0001   PROT 7.4 09/07/2014 0001   ALBUMIN 4.2 09/07/2014 0001   AST 20 09/07/2014 0001   ALT 22 09/07/2014 0001   ALKPHOS 53 09/07/2014 0001   BILITOT 0.5 09/07/2014 0001   FOBT +  ASSESSMENT/PLAN: 49 year old male with little past medical history who is seen in consultation at the request of Dr. Redmond School to evaluate heme positive stool.  1. +FOBT -- we discussed the heme positive stool result and I have recommended colonoscopy. We discussed  the risks, benefits and alternatives and he is agreeable to proceed. There is no evidence for anemia, which is reassuring. Further recommendations thereafter    SW:NIOE C Corey [name], [address] [place], [place] [address]

## 2014-12-31 ENCOUNTER — Ambulatory Visit (AMBULATORY_SURGERY_CENTER): Payer: 59 | Admitting: Internal Medicine

## 2014-12-31 ENCOUNTER — Encounter: Payer: Self-pay | Admitting: Internal Medicine

## 2014-12-31 VITALS — BP 116/64 | HR 72 | Temp 98.0°F | Resp 16 | Ht 67.75 in | Wt 231.0 lb

## 2014-12-31 DIAGNOSIS — K635 Polyp of colon: Secondary | ICD-10-CM

## 2014-12-31 DIAGNOSIS — D122 Benign neoplasm of ascending colon: Secondary | ICD-10-CM

## 2014-12-31 DIAGNOSIS — K529 Noninfective gastroenteritis and colitis, unspecified: Secondary | ICD-10-CM

## 2014-12-31 DIAGNOSIS — D125 Benign neoplasm of sigmoid colon: Secondary | ICD-10-CM

## 2014-12-31 DIAGNOSIS — R195 Other fecal abnormalities: Secondary | ICD-10-CM

## 2014-12-31 MED ORDER — SODIUM CHLORIDE 0.9 % IV SOLN: 500.0000 mL | INTRAVENOUS | Status: DC

## 2014-12-31 NOTE — Progress Notes (Signed)
Called to room to assist during endoscopic procedure.  Patient ID and intended procedure confirmed with present staff. Received instructions for my participation in the procedure from the performing physician.  

## 2014-12-31 NOTE — Op Note (Signed)
[facility]  [address] [place], [address]   COLONOSCOPY PROCEDURE REPORT  PATIENT: Corey [name], Corey [name]  MR#: [id number] BIRTHDATE: September 04, 1965 , 48  yrs. old GENDER: male ENDOSCOPIST: Jerene Bears, MD REFERRED AJ:GOTL Redmond School, M.D. PROCEDURE DATE:  12/31/2014 PROCEDURE:   Colonoscopy, diagnostic, Colonoscopy with cold biopsy polypectomy, and Colonoscopy with snare polypectomy First Screening Colonoscopy - Avg.  risk and is 50 yrs.  old or older - No.  Prior Negative Screening - Now for repeat screening. N/A  History of Adenoma - Now for follow-up colonoscopy & has been > or = to 3 yrs.  N/A  Polyps removed today? Yes ASA CLASS:   Class II INDICATIONS:heme-positive stool. MEDICATIONS: Monitored anesthesia care and Propofol 300 mg IV  DESCRIPTION OF PROCEDURE:   After the risks benefits and alternatives of the procedure were thoroughly explained, informed consent was obtained.  The digital rectal exam revealed no rectal mass.   The LB [id number]  endoscope was introduced through the anus and advanced to the cecum, which was identified by both the appendix and ileocecal valve. No adverse events experienced. The quality of the prep was good.  (Suprep was used)  The instrument was then slowly withdrawn as the colon was fully examined. Estimated blood loss is zero unless otherwise noted in this procedure report.  COLON FINDINGS: A sessile polyp measuring 4 mm in size was found in the ascending colon.  A polypectomy was performed with cold forceps.  The resection was complete, the polyp tissue was completely retrieved and sent to histology.   A sessile polyp measuring 6 mm in size was found in the sigmoid colon.  A polypectomy was performed with a cold snare.  The resection was complete, the polyp tissue was completely retrieved and sent to histology.   The examination was otherwise normal.  Retroflexed views revealed internal hemorrhoids. The time to cecum =  2.9 Withdrawal time = 10.3   The scope was withdrawn and the procedure completed. COMPLICATIONS: There were no immediate complications.  ENDOSCOPIC IMPRESSION: 1.   Sessile polyp was found in the ascending colon; polypectomy was performed with cold forceps 2.   Sessile polyp was found in the sigmoid colon; polypectomy was performed with a cold snare 3.   The examination was otherwise normal  RECOMMENDATIONS: 1.  Await pathology results 2.  If the polyps removed today are proven to be adenomatous (pre-cancerous) polyps, you will need a repeat colonoscopy in 5 years.  Otherwise you should continue to follow colorectal cancer screening guidelines for "routine risk" patients with colonoscopy in 10 years.  You will receive a letter within 1-2 weeks with the results of your biopsy as well as final recommendations.  Please call my office if you have not received a letter after 3 weeks.  eSigned:  Jerene Bears, MD 12/31/2014 3:34 PM  cc: Jill Alexanders, MD and The Patient

## 2014-12-31 NOTE — Patient Instructions (Signed)
YOU HAD AN ENDOSCOPIC PROCEDURE TODAY AT THE  ENDOSCOPY CENTER:   Refer to the procedure report that was given to you for any specific questions about what was found during the examination.  If the procedure report does not answer your questions, please call your gastroenterologist to clarify.  If you requested that your care partner not be given the details of your procedure findings, then the procedure report has been included in a sealed envelope for you to review at your convenience later.  YOU SHOULD EXPECT: Some feelings of bloating in the abdomen. Passage of more gas than usual.  Walking can help get rid of the air that was put into your GI tract during the procedure and reduce the bloating. If you had a lower endoscopy (such as a colonoscopy or flexible sigmoidoscopy) you may notice spotting of blood in your stool or on the toilet paper. If you underwent a bowel prep for your procedure, you may not have a normal bowel movement for a few days.  Please Note:  You might notice some irritation and congestion in your nose or some drainage.  This is from the oxygen used during your procedure.  There is no need for concern and it should clear up in a day or so.  SYMPTOMS TO REPORT IMMEDIATELY:   Following lower endoscopy (colonoscopy or flexible sigmoidoscopy):  Excessive amounts of blood in the stool  Significant tenderness or worsening of abdominal pains  Swelling of the abdomen that is new, acute  Fever of 100F or higher   For urgent or emergent issues, a gastroenterologist can be reached at any hour by calling [phone].   DIET: Your first meal following the procedure should be a small meal and then it is ok to progress to your normal diet. Heavy or fried foods are harder to digest and may make you feel nauseous or bloated.  Likewise, meals heavy in dairy and vegetables can increase bloating.  Drink plenty of fluids but you should avoid alcoholic beverages for 24  hours.  ACTIVITY:  You should plan to take it easy for the rest of today and you should NOT DRIVE or use heavy machinery until tomorrow (because of the sedation medicines used during the test).    FOLLOW UP: Our staff will call the number listed on your records the next business day following your procedure to check on you and address any questions or concerns that you may have regarding the information given to you following your procedure. If we do not reach you, we will leave a message.  However, if you are feeling well and you are not experiencing any problems, there is no need to return our call.  We will assume that you have returned to your regular daily activities without incident.  If any biopsies were taken you will be contacted by phone or by letter within the next 1-3 weeks.  Please call us at [phone] if you have not heard about the biopsies in 3 weeks.    SIGNATURES/CONFIDENTIALITY: You and/or your care partner have signed paperwork which will be entered into your electronic medical record.  These signatures attest to the fact that that the information above on your After Visit Summary has been reviewed and is understood.  Full responsibility of the confidentiality of this discharge information lies with you and/or your care-partner.  Read all of the handouts given to you by your recovery room nurse. 

## 2014-12-31 NOTE — Progress Notes (Signed)
Report to PACU, RN, vss, BBS= Clear.  

## 2015-01-01 ENCOUNTER — Telehealth: Payer: Self-pay | Admitting: *Deleted

## 2015-01-01 NOTE — Telephone Encounter (Signed)
  Follow up Call-  Call back number 12/31/2014  Post procedure Call Back phone  # [phone]  Permission to leave phone message Yes     Patient questions:  Do you have a fever, pain , or abdominal swelling? No. Pain Score  0 *  Have you tolerated food without any problems? Yes.    Have you been able to return to your normal activities? Yes.    Do you have any questions about your discharge instructions: Diet   No. Medications  No. Follow up visit  No.  Do you have questions or concerns about your Care? No.  Actions: * If pain score is 4 or above: No action needed, pain <4.

## 2015-01-09 ENCOUNTER — Encounter: Payer: Self-pay | Admitting: Internal Medicine

## 2015-03-19 ENCOUNTER — Telehealth: Payer: Self-pay | Admitting: Family Medicine

## 2015-03-19 DIAGNOSIS — F524 Premature ejaculation: Secondary | ICD-10-CM

## 2015-03-19 MED ORDER — PAROXETINE HCL 20 MG PO TABS: ORAL_TABLET | ORAL | Status: DC

## 2015-03-19 NOTE — Telephone Encounter (Signed)
Pt called and stated that he would like to increase his dose of paxil. He stated that he was on a whole tablet and it was changed to 1/2. He thinks he does a lot better on a full dose and would like to go back to that dose. Pt can be reached at [phone] and uses [facility].

## 2015-03-19 NOTE — Telephone Encounter (Signed)
Let him know that I called in a prescription for a full tablet

## 2015-03-19 NOTE — Telephone Encounter (Signed)
PT INFORMED AND VERBALIZED UNDERSTANDING 

## 2015-09-09 ENCOUNTER — Encounter: Payer: Self-pay | Admitting: Family Medicine

## 2015-09-09 ENCOUNTER — Ambulatory Visit (INDEPENDENT_AMBULATORY_CARE_PROVIDER_SITE_OTHER): Payer: Federal, State, Local not specified - PPO | Admitting: Family Medicine

## 2015-09-09 ENCOUNTER — Other Ambulatory Visit: Payer: Self-pay | Admitting: Family Medicine

## 2015-09-09 VITALS — BP 138/92 | HR 67 | Ht 68.0 in | Wt 223.2 lb

## 2015-09-09 DIAGNOSIS — Z0000 Encounter for general adult medical examination without abnormal findings: Secondary | ICD-10-CM | POA: Diagnosis not present

## 2015-09-09 DIAGNOSIS — F524 Premature ejaculation: Secondary | ICD-10-CM

## 2015-09-09 DIAGNOSIS — E669 Obesity, unspecified: Secondary | ICD-10-CM

## 2015-09-09 DIAGNOSIS — E785 Hyperlipidemia, unspecified: Secondary | ICD-10-CM | POA: Diagnosis not present

## 2015-09-09 LAB — COMPREHENSIVE METABOLIC PANEL
ALT: 34 U/L (ref 9–46)
AST: 23 U/L (ref 10–40)
Albumin: 4.1 g/dL (ref 3.6–5.1)
Alkaline Phosphatase: 56 U/L (ref 40–115)
BILIRUBIN TOTAL: 0.3 mg/dL (ref 0.2–1.2)
BUN: 12 mg/dL (ref 7–25)
CALCIUM: 9.3 mg/dL (ref 8.6–10.3)
CO2: 29 mmol/L (ref 20–31)
Chloride: 101 mmol/L (ref 98–110)
Creat: 1.21 mg/dL (ref 0.60–1.35)
GLUCOSE: 107 mg/dL — AB (ref 65–99)
Potassium: 4.5 mmol/L (ref 3.5–5.3)
SODIUM: 138 mmol/L (ref 135–146)
Total Protein: 7.2 g/dL (ref 6.1–8.1)

## 2015-09-09 LAB — CBC WITH DIFFERENTIAL/PLATELET
Basophils Absolute: 42 cells/uL (ref 0–200)
Basophils Relative: 1 %
EOS PCT: 3 %
Eosinophils Absolute: 126 cells/uL (ref 15–500)
HCT: 43.2 % (ref 38.5–50.0)
Hemoglobin: 14 g/dL (ref 13.2–17.1)
LYMPHS PCT: 44 %
Lymphs Abs: 1848 cells/uL (ref 850–3900)
MCH: 27.8 pg (ref 27.0–33.0)
MCHC: 32.4 g/dL (ref 32.0–36.0)
MCV: 85.7 fL (ref 80.0–100.0)
MONOS PCT: 7 %
MPV: 9.5 fL (ref 7.5–12.5)
Monocytes Absolute: 294 cells/uL (ref 200–950)
NEUTROS ABS: 1890 {cells}/uL (ref 1500–7800)
Neutrophils Relative %: 45 %
PLATELETS: 262 10*3/uL (ref 140–400)
RBC: 5.04 MIL/uL (ref 4.20–5.80)
RDW: 15.2 % — AB (ref 11.0–15.0)
WBC: 4.2 10*3/uL (ref 4.0–10.5)

## 2015-09-09 LAB — LIPID PANEL
Cholesterol: 227 mg/dL — ABNORMAL HIGH (ref 125–200)
HDL: 56 mg/dL (ref >=40)
LDL CALC: 154 mg/dL — AB (ref <130)
Total CHOL/HDL Ratio: 4.1 Ratio (ref <=5.0)
Triglycerides: 86 mg/dL (ref <150)
VLDL: 17 mg/dL (ref <30)

## 2015-09-09 NOTE — Progress Notes (Signed)
   Subjective:    Patient ID: Corey [name], male    DOB: [birth year], 50 y.o.   MRN: [id number]  HPI He is here for complete examination.He does have a previous history of hyperlipidemia and presently is on Paxil for treatment of premature ejaculation. That medication is working well. He has no other concerns or complaints. No allergy symptoms, GI or cardiac complaints. His work keeps him quite physically active and he also works out twice per week. His eating habits are unchanged. He does have 3 grown children. He is single but dating someone regularly. Family and social history as well as health maintenance and immunizations were reviewed.   Review of Systems  All other systems reviewed and are negative.      Objective:   Physical Exam BP 138/92 mmHg  Pulse 67  Ht 5\' 8"  (1.727 m)  Wt 223 lb 3.2 oz (101.243 kg)  BMI 33.95 kg/m2  General Appearance:    Alert, cooperative, no distress, appears stated age  Head:    Normocephalic, without obvious abnormality, atraumatic  Eyes:    PERRL, conjunctiva/corneas clear, EOM's intact, fundi    benign  Ears:    Normal TM's and external ear canals  Nose:   Nares normal, mucosa normal, no drainage or sinus   tenderness  Throat:   Lips, mucosa, and tongue normal; teeth and gums normal  Neck:   Supple, no lymphadenopathy;  thyroid:  no   enlargement/tenderness/nodules; no carotid   bruit or JVD  Back:    Spine nontender, no curvature, ROM normal, no CVA     tenderness  Lungs:     Clear to auscultation bilaterally without wheezes, rales or     ronchi; respirations unlabored  Chest Wall:    No tenderness or deformity   Heart:    Regular rate and rhythm, S1 and S2 normal, no murmur, rub   or gallop  Breast Exam:    No chest wall tenderness, masses or gynecomastia  Abdomen:     Soft, non-tender, nondistended, normoactive bowel sounds,    no masses, no hepatosplenomegaly        Extremities:   No clubbing, cyanosis or edema  Pulses:   2+ and  symmetric all extremities  Skin:   Skin color, texture, turgor normal, no rashes or lesions  Lymph nodes:   Cervical, supraclavicular, and axillary nodes normal  Neurologic:   CNII-XII intact, normal strength, sensation and gait; reflexes 2+ and symmetric throughout          Psych:   Normal mood, affect, hygiene and grooming.          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 He does keep himself quite physically active. Routine blood screening ordered. I will renew his Paxil at his request.

## 2015-09-10 LAB — HEMOGLOBIN A1C
Hgb A1c MFr Bld: 6.1 % — ABNORMAL HIGH (ref <5.7)
Mean Plasma Glucose: 128 mg/dL

## 2016-02-25 ENCOUNTER — Other Ambulatory Visit: Payer: Self-pay | Admitting: Family Medicine

## 2016-02-25 DIAGNOSIS — F524 Premature ejaculation: Secondary | ICD-10-CM

## 2016-02-25 NOTE — Telephone Encounter (Signed)
Is this okay to refill? 

## 2016-05-22 ENCOUNTER — Other Ambulatory Visit: Payer: Self-pay | Admitting: Family Medicine

## 2016-05-22 DIAGNOSIS — F524 Premature ejaculation: Secondary | ICD-10-CM

## 2016-05-22 NOTE — Telephone Encounter (Signed)
Is this okay to refill? 

## 2016-05-22 NOTE — Telephone Encounter (Signed)
He needs an appointment. If he has not run out, keep him on it. Please check the record on that

## 2016-05-26 NOTE — Telephone Encounter (Signed)
Pt has appointment 06/08/16

## 2016-06-08 ENCOUNTER — Encounter: Payer: Self-pay | Admitting: Family Medicine

## 2016-06-08 ENCOUNTER — Ambulatory Visit (INDEPENDENT_AMBULATORY_CARE_PROVIDER_SITE_OTHER): Payer: Federal, State, Local not specified - PPO | Admitting: Family Medicine

## 2016-06-08 VITALS — BP 150/80 | HR 90 | Resp 16 | Ht 69.0 in | Wt 231.0 lb

## 2016-06-08 DIAGNOSIS — E6609 Other obesity due to excess calories: Secondary | ICD-10-CM

## 2016-06-08 DIAGNOSIS — Z6832 Body mass index (BMI) 32.0-32.9, adult: Secondary | ICD-10-CM | POA: Diagnosis not present

## 2016-06-08 DIAGNOSIS — I10 Essential (primary) hypertension: Secondary | ICD-10-CM | POA: Diagnosis not present

## 2016-06-08 NOTE — Progress Notes (Signed)
   Subjective:    Patient ID: Corey [name], male    DOB: 1966/03/25, 51 y.o.   MRN: [id number]  HPI He is here for medication check. He is not interested in the flu shot. He continues to use Paxil to help with premature ejaculation. His work and personal life keep him quite busy. He is also taking care of his mother who is in a nursing facility for Alzheimer's disease. He has no other concerns or complaints.   Review of Systems     Objective:   Physical Exam Alert and in no distress. Tympanic membranes and canals are normal. Pharyngeal area is normal. Neck is supple without adenopathy or thyromegaly. Cardiac exam shows a regular sinus rhythm without murmurs or gallops. Lungs are clear to auscultation.        Assessment & Plan:  Class 1 obesity due to excess calories without serious comorbidity with body mass index (BMI) of 32.0 to 32.9 in adult  Essential hypertension  I discussed his blood pressure with him and would like to put him on medication however he has refused. He would like to work further on making dietary and exercise changes. Recommended 20 minutes of something physical every day on top of his routine normal physical activities. Also discussed cutting back on carbohydrates specifically "white foods". Return here in 2 months.

## 2016-08-21 ENCOUNTER — Other Ambulatory Visit: Payer: Self-pay | Admitting: Family Medicine

## 2016-08-21 DIAGNOSIS — F524 Premature ejaculation: Secondary | ICD-10-CM

## 2016-08-21 NOTE — Telephone Encounter (Signed)
Is this okay to refill? 

## 2016-08-31 ENCOUNTER — Ambulatory Visit (INDEPENDENT_AMBULATORY_CARE_PROVIDER_SITE_OTHER): Payer: Federal, State, Local not specified - PPO | Admitting: Family Medicine

## 2016-08-31 ENCOUNTER — Encounter: Payer: Self-pay | Admitting: Family Medicine

## 2016-08-31 ENCOUNTER — Other Ambulatory Visit: Payer: Self-pay | Admitting: Family Medicine

## 2016-08-31 VITALS — BP 114/76 | HR 77 | Ht 68.0 in | Wt 223.0 lb

## 2016-08-31 DIAGNOSIS — F524 Premature ejaculation: Secondary | ICD-10-CM

## 2016-08-31 DIAGNOSIS — G4733 Obstructive sleep apnea (adult) (pediatric): Secondary | ICD-10-CM

## 2016-08-31 DIAGNOSIS — Z6832 Body mass index (BMI) 32.0-32.9, adult: Secondary | ICD-10-CM

## 2016-08-31 DIAGNOSIS — E785 Hyperlipidemia, unspecified: Secondary | ICD-10-CM | POA: Insufficient documentation

## 2016-08-31 DIAGNOSIS — Z0000 Encounter for general adult medical examination without abnormal findings: Secondary | ICD-10-CM | POA: Diagnosis not present

## 2016-08-31 DIAGNOSIS — E6609 Other obesity due to excess calories: Secondary | ICD-10-CM

## 2016-08-31 LAB — CBC WITH DIFFERENTIAL/PLATELET
BASOS ABS: 49 {cells}/uL (ref 0–200)
BASOS PCT: 1 %
EOS PCT: 4 %
Eosinophils Absolute: 196 cells/uL (ref 15–500)
HCT: 43.5 % (ref 38.5–50.0)
Hemoglobin: 14.1 g/dL (ref 13.2–17.1)
Lymphocytes Relative: 47 %
Lymphs Abs: 2303 cells/uL (ref 850–3900)
MCH: 27.9 pg (ref 27.0–33.0)
MCHC: 32.4 g/dL (ref 32.0–36.0)
MCV: 86 fL (ref 80.0–100.0)
MONOS PCT: 7 %
MPV: 9.2 fL (ref 7.5–12.5)
Monocytes Absolute: 343 cells/uL (ref 200–950)
NEUTROS ABS: 2009 {cells}/uL (ref 1500–7800)
Neutrophils Relative %: 41 %
PLATELETS: 286 10*3/uL (ref 140–400)
RBC: 5.06 MIL/uL (ref 4.20–5.80)
RDW: 14.7 % (ref 11.0–15.0)
WBC: 4.9 10*3/uL (ref 4.0–10.5)

## 2016-08-31 LAB — POCT URINALYSIS DIPSTICK
Bilirubin, UA: NEGATIVE
Blood, UA: NEGATIVE
Glucose, UA: NEGATIVE
Ketones, UA: NEGATIVE
LEUKOCYTES UA: NEGATIVE
NITRITE UA: NEGATIVE
PH UA: 6 (ref 5.0–8.0)
PROTEIN UA: NEGATIVE
Spec Grav, UA: 1.03 (ref 1.030–1.035)
UROBILINOGEN UA: NEGATIVE (ref ?–2.0)

## 2016-08-31 MED ORDER — PAROXETINE HCL 20 MG PO TABS: 20.0000 mg | ORAL_TABLET | Freq: Every morning | ORAL | 3 refills | Status: DC

## 2016-08-31 NOTE — Progress Notes (Signed)
Subjective:    Patient ID: Corey [name], male    DOB: Feb 02, 1966, 51 y.o.   MRN: [id number]  HPI He is here for a complete examination. He does have underlying OSA and has been on CPAP intermittently. He does complain of difficulty with the appliance. He has not had a readout recently. He continues to do well on paroxetine for treatment of his premature ejaculation. He is taking this on a daily basis to help with that. He is involved in a relationship. He also has a previous history of hyperlipidemia but is not on any medication. Also note record about hypertension however his blood pressure is adequate today. His weight is unchanged. Work and home life are going well. Family and social history as well as health maintenance and immunizations were reviewed. His work keeps him quite physically active. He also is working out 2 or 3 times per week. He has had a previous colonoscopy and presumed polyps however the pathology report showed no evidence of polyp but focal colitis. Presently he is having no GI symptoms.   Review of Systems  All other systems reviewed and are negative.      Objective:   Physical Exam BP 114/76   Pulse 77   Ht 5\' 8"  (1.727 m)   Wt 223 lb (101.2 kg)   SpO2 98%   BMI 33.91 kg/m   General Appearance:    Alert, cooperative, no distress, appears stated age  Head:    Normocephalic, without obvious abnormality, atraumatic  Eyes:    PERRL, conjunctiva/corneas clear, EOM's intact, fundi    benign  Ears:    Normal TM's and external ear canals  Nose:   Nares normal, mucosa normal, no drainage or sinus   tenderness  Throat:   Lips, mucosa, and tongue normal; teeth and gums normal  Neck:   Supple, no lymphadenopathy;  thyroid:  no   enlargement/tenderness/nodules; no carotid   bruit or JVD     Lungs:     Clear to auscultation bilaterally without wheezes, rales or     ronchi; respirations unlabored      Heart:    Regular rate and rhythm, S1 and S2 normal, no murmur,  rub   or gallop     Abdomen:     Soft, non-tender, nondistended, normoactive bowel sounds,    no masses, no hepatosplenomegaly  Genitalia:  Deferred   Rectal:   deferred  Extremities:   No clubbing, cyanosis or edema  Pulses:   2+ and symmetric all extremities  Skin:   Skin color, texture, turgor normal, no rashes or lesions  Lymph nodes:   Cervical, supraclavicular, and axillary nodes normal  Neurologic:   CNII-XII intact, normal strength, sensation and gait; reflexes 2+ and symmetric throughout          Psych:   Normal mood, affect, hygiene and grooming.          Assessment & Plan:  Routine general medical examination at a health care facility - Plan: POCT Urinalysis Dipstick, CBC with Differential/Platelet, Comprehensive metabolic panel, Lipid panel  Class 1 obesity due to excess calories without serious comorbidity with body mass index (BMI) of 32.0 to 32.9 in adult  Hyperlipidemia LDL goal <100 - Plan: Lipid panel  Premature ejaculation - Plan: PARoxetine (PAXIL) 20 MG tablet  OSA (obstructive sleep apnea)  cursed him to continue to work out. Also encouraged him to cut back on carbohydrates and make a goal waist size. Discussed actually taking food to  work with him rather than waiting 12 hours to eat. We'll continue him on his Paxil. He is to get a CPAP readout and send it to me. Discussed possibly using sleep meds to help with this. Explained that although the gastroenterologist thought he saw polyps PND and were not but more of a coastal irritation.

## 2016-08-31 NOTE — Patient Instructions (Signed)
Cut back on "white food'. Portion sizes. More frequent but smaller meals. Work on your waist size

## 2016-09-01 LAB — COMPREHENSIVE METABOLIC PANEL
ALT: 25 U/L (ref 9–46)
AST: 22 U/L (ref 10–40)
Albumin: 4.2 g/dL (ref 3.6–5.1)
Alkaline Phosphatase: 53 U/L (ref 40–115)
BUN: 13 mg/dL (ref 7–25)
CHLORIDE: 105 mmol/L (ref 98–110)
CO2: 27 mmol/L (ref 20–31)
CREATININE: 1.3 mg/dL (ref 0.60–1.35)
Calcium: 9.2 mg/dL (ref 8.6–10.3)
GLUCOSE: 118 mg/dL — AB (ref 65–99)
Potassium: 4.8 mmol/L (ref 3.5–5.3)
SODIUM: 142 mmol/L (ref 135–146)
TOTAL PROTEIN: 7.2 g/dL (ref 6.1–8.1)
Total Bilirubin: 0.4 mg/dL (ref 0.2–1.2)

## 2016-09-01 LAB — LIPID PANEL
CHOLESTEROL: 212 mg/dL — AB (ref <200)
HDL: 46 mg/dL (ref >40)
LDL CALC: 148 mg/dL — AB (ref <100)
Total CHOL/HDL Ratio: 4.6 Ratio (ref <5.0)
Triglycerides: 90 mg/dL (ref <150)
VLDL: 18 mg/dL (ref <30)

## 2016-09-02 LAB — HEMOGLOBIN A1C
HEMOGLOBIN A1C: 5.7 % — AB (ref <5.7)
MEAN PLASMA GLUCOSE: 117 mg/dL

## 2016-09-07 ENCOUNTER — Encounter: Payer: Federal, State, Local not specified - PPO | Admitting: Family Medicine

## 2016-11-02 DIAGNOSIS — K080 Exfoliation of teeth due to systemic causes: Secondary | ICD-10-CM | POA: Diagnosis not present

## 2017-09-06 ENCOUNTER — Ambulatory Visit: Payer: Federal, State, Local not specified - PPO | Admitting: Family Medicine

## 2017-09-06 ENCOUNTER — Encounter: Payer: Self-pay | Admitting: Family Medicine

## 2017-09-06 VITALS — BP 128/86 | HR 80 | Temp 98.2°F | Ht 68.0 in | Wt 212.2 lb

## 2017-09-06 DIAGNOSIS — E6609 Other obesity due to excess calories: Secondary | ICD-10-CM | POA: Insufficient documentation

## 2017-09-06 DIAGNOSIS — E785 Hyperlipidemia, unspecified: Secondary | ICD-10-CM | POA: Diagnosis not present

## 2017-09-06 DIAGNOSIS — Z0000 Encounter for general adult medical examination without abnormal findings: Secondary | ICD-10-CM

## 2017-09-06 DIAGNOSIS — G4733 Obstructive sleep apnea (adult) (pediatric): Secondary | ICD-10-CM | POA: Diagnosis not present

## 2017-09-06 DIAGNOSIS — Z6832 Body mass index (BMI) 32.0-32.9, adult: Secondary | ICD-10-CM

## 2017-09-06 DIAGNOSIS — F524 Premature ejaculation: Secondary | ICD-10-CM

## 2017-09-06 LAB — POCT URINALYSIS DIP (PROADVANTAGE DEVICE)
Bilirubin, UA: NEGATIVE
GLUCOSE UA: NEGATIVE mg/dL
Ketones, POC UA: NEGATIVE mg/dL
LEUKOCYTES UA: NEGATIVE
Nitrite, UA: NEGATIVE
PROTEIN UA: NEGATIVE mg/dL
Specific Gravity, Urine: 1.02
Urobilinogen, Ur: 3.5
pH, UA: 6 (ref 5.0–8.0)

## 2017-09-06 MED ORDER — PAROXETINE HCL 20 MG PO TABS: 20.0000 mg | ORAL_TABLET | Freq: Every morning | ORAL | 3 refills | Status: DC

## 2017-09-06 NOTE — Addendum Note (Signed)
Addended by: Elyse Jarvis on: 09/06/2017 09:38 AM   Modules accepted: Orders

## 2017-09-06 NOTE — Progress Notes (Signed)
Subjective:    Patient ID: Corey [name], male    DOB: 02/19/1966, 52 y.o.   MRN: [id number]  HPI He is here for complete examination.  He does have underlying OSA and has been intermittently using his CPAP stating sometimes it causes irritation.  He has not followed up with his DME company to see if they could help with that.  He continues on Paxil which helps his premature ejaculation.  He is on a church related fast through he started and apparently has lost several pounds due to this.  His job keeps him quite physically active and he does exercise on weekends.  Does have a history of hyperlipidemia.  He had a colonoscopy in 2016 for evaluation of GI bleeding and colitis is all they found.  He is to have this repeated in 2021.  Family and social history as well as health maintenance and immunizations was reviewed   Review of Systems  All other systems reviewed and are negative.      Objective:   Physical Exam BP 128/86 (BP Location: Left Arm, Patient Position: Sitting)   Pulse 80   Temp 98.2 F (36.8 C)   Ht 5\' 8"  (1.727 m)   Wt 212 lb 3.2 oz (96.3 kg)   SpO2 98%   BMI 32.26 kg/m   General Appearance:    Alert, cooperative, no distress, appears stated age  Head:    Normocephalic, without obvious abnormality, atraumatic  Eyes:    PERRL, conjunctiva/corneas clear, EOM's intact, fundi    benign  Ears:    Normal TM's and external ear canals  Nose:   Nares normal, mucosa normal, no drainage or sinus   tenderness  Throat:   Lips, mucosa, and tongue normal; teeth and gums normal  Neck:   Supple, no lymphadenopathy;  thyroid:  no   enlargement/tenderness/nodules; no carotid   bruit or JVD     Lungs:     Clear to auscultation bilaterally without wheezes, rales or     ronchi; respirations unlabored      Heart:    Regular rate and rhythm, S1 and S2 normal, no murmur, rub   or gallop     Abdomen:     Soft, non-tender, nondistended, normoactive bowel sounds,    no masses, no  hepatosplenomegaly  Genitalia:  deferred  Rectal:  deferred  Extremities:   No clubbing, cyanosis or edema  Pulses:   2+ and symmetric all extremities  Skin:   Skin color, texture, turgor normal, no rashes or lesions  Lymph nodes:   Cervical, supraclavicular, and axillary nodes normal  Neurologic:   CNII-XII intact, normal strength, sensation and gait; reflexes 2+ and symmetric throughout          Psych:   Normal mood, affect, hygiene and grooming.    Urine microscopic was negative      Assessment & Plan:  Routine general medical examination at a health care facility - Plan: CBC with Differential/Platelet, Comprehensive metabolic panel, Lipid panel  Class 1 obesity due to excess calories without serious comorbidity with body mass index (BMI) of 32.0 to 32.9 in adult  Hyperlipidemia LDL goal <100 - Plan: Lipid panel  Premature ejaculation - Plan: PARoxetine (PAXIL) 20 MG tablet  OSA (obstructive sleep apnea) He keeps himself quite physically active and I therefore discussed dietary modification especially cutting back on carbohydrates.  He remembers me mentioning this in the past.  Recommend he set a goal waist size of somewhere between 34-36 I  also encouraged him to call [facility] and have them work with him with getting a better appliance for his sleep apnea for better compliance.

## 2017-09-07 LAB — COMPREHENSIVE METABOLIC PANEL
ALK PHOS: 65 IU/L (ref 39–117)
ALT: 89 IU/L — ABNORMAL HIGH (ref 0–44)
AST: 43 IU/L — AB (ref 0–40)
Albumin/Globulin Ratio: 1.6 (ref 1.2–2.2)
Albumin: 4.5 g/dL (ref 3.5–5.5)
BUN / CREAT RATIO: 11 (ref 9–20)
BUN: 15 mg/dL (ref 6–24)
CHLORIDE: 100 mmol/L (ref 96–106)
CO2: 26 mmol/L (ref 20–29)
CREATININE: 1.39 mg/dL — AB (ref 0.76–1.27)
Calcium: 8.9 mg/dL (ref 8.7–10.2)
GFR calc Af Amer: 68 mL/min/{1.73_m2} (ref >59)
GFR calc non Af Amer: 59 mL/min/{1.73_m2} — ABNORMAL LOW (ref >59)
GLUCOSE: 104 mg/dL — AB (ref 65–99)
Globulin, Total: 2.8 g/dL (ref 1.5–4.5)
Potassium: 4.9 mmol/L (ref 3.5–5.2)
Sodium: 139 mmol/L (ref 134–144)
Total Protein: 7.3 g/dL (ref 6.0–8.5)

## 2017-09-07 LAB — CBC WITH DIFFERENTIAL/PLATELET
BASOS: 0 %
Basophils Absolute: 0 10*3/uL (ref 0.0–0.2)
EOS (ABSOLUTE): 0.1 10*3/uL (ref 0.0–0.4)
EOS: 3 %
Hematocrit: 42.5 % (ref 37.5–51.0)
Hemoglobin: 13.6 g/dL (ref 13.0–17.7)
IMMATURE GRANULOCYTES: 0 %
Immature Grans (Abs): 0 10*3/uL (ref 0.0–0.1)
LYMPHS: 47 %
Lymphocytes Absolute: 2.2 10*3/uL (ref 0.7–3.1)
MCH: 27.3 pg (ref 26.6–33.0)
MCHC: 32 g/dL (ref 31.5–35.7)
MCV: 85 fL (ref 79–97)
Monocytes Absolute: 0.2 10*3/uL (ref 0.1–0.9)
Monocytes: 5 %
NEUTROS PCT: 45 %
Neutrophils Absolute: 2.1 10*3/uL (ref 1.4–7.0)
PLATELETS: 292 10*3/uL (ref 150–379)
RBC: 4.98 x10E6/uL (ref 4.14–5.80)
RDW: 14.4 % (ref 12.3–15.4)
WBC: 4.7 10*3/uL (ref 3.4–10.8)

## 2017-09-07 LAB — LIPID PANEL
Chol/HDL Ratio: 4.1 ratio (ref 0.0–5.0)
Cholesterol, Total: 192 mg/dL (ref 100–199)
HDL: 47 mg/dL (ref >39)
LDL CALC: 127 mg/dL — AB (ref 0–99)
TRIGLYCERIDES: 88 mg/dL (ref 0–149)
VLDL CHOLESTEROL CAL: 18 mg/dL (ref 5–40)

## 2017-09-08 LAB — HEPATITIS C ANTIBODY: Hep C Virus Ab: <0.1 s/co ratio (ref 0.0–0.9)

## 2017-09-08 LAB — SPECIMEN STATUS REPORT

## 2017-12-23 ENCOUNTER — Ambulatory Visit: Payer: Federal, State, Local not specified - PPO | Admitting: Family Medicine

## 2017-12-23 ENCOUNTER — Encounter: Payer: Self-pay | Admitting: Family Medicine

## 2017-12-23 VITALS — BP 120/68 | HR 83 | Temp 98.1°F | Wt 229.2 lb

## 2017-12-23 DIAGNOSIS — R252 Cramp and spasm: Secondary | ICD-10-CM | POA: Diagnosis not present

## 2017-12-23 NOTE — Progress Notes (Signed)
   Subjective:    Patient ID: Corey [name], male    DOB: [birth year], 52 y.o.   MRN: [id number]  HPI  He complains of a one-month history of bilateral leg cramps.  He does workout regularly but has not changed his workout routine.  He does have these daily.  He has tried NSAIDs as well as did occasionally try codeine with no benefit.  He is on no new nutritional supplements.   Review of Systems     Objective:   Physical Exam Alert and in no distress.  No palpable tenderness to his thighs or calf muscles.  Skin is normal.  Pulses are normal.  Reflexes normal.       Assessment & Plan:  Leg cramps - Plan: CBC with Differential/Platelet, Comprehensive metabolic panel I will do blood work on him but probably end up having him drink more fluids, use Tylenol and may possibly be given a short course of muscle relaxer.

## 2017-12-24 LAB — CBC WITH DIFFERENTIAL/PLATELET
Basophils Absolute: 0 10*3/uL (ref 0.0–0.2)
Basos: 1 %
EOS (ABSOLUTE): 0.2 10*3/uL (ref 0.0–0.4)
EOS: 4 %
HEMATOCRIT: 41.9 % (ref 37.5–51.0)
Hemoglobin: 13.3 g/dL (ref 13.0–17.7)
IMMATURE GRANULOCYTES: 0 %
Immature Grans (Abs): 0 10*3/uL (ref 0.0–0.1)
LYMPHS: 47 %
Lymphocytes Absolute: 2.7 10*3/uL (ref 0.7–3.1)
MCH: 27.7 pg (ref 26.6–33.0)
MCHC: 31.7 g/dL (ref 31.5–35.7)
MCV: 87 fL (ref 79–97)
MONOCYTES: 5 %
Monocytes Absolute: 0.3 10*3/uL (ref 0.1–0.9)
NEUTROS PCT: 43 %
Neutrophils Absolute: 2.4 10*3/uL (ref 1.4–7.0)
Platelets: 290 10*3/uL (ref 150–450)
RBC: 4.81 x10E6/uL (ref 4.14–5.80)
RDW: 14.9 % (ref 12.3–15.4)
WBC: 5.6 10*3/uL (ref 3.4–10.8)

## 2017-12-24 LAB — COMPREHENSIVE METABOLIC PANEL
ALT: 24 IU/L (ref 0–44)
AST: 23 IU/L (ref 0–40)
Albumin/Globulin Ratio: 1.5 (ref 1.2–2.2)
Albumin: 4.3 g/dL (ref 3.5–5.5)
Alkaline Phosphatase: 55 IU/L (ref 39–117)
BUN/Creatinine Ratio: 9 (ref 9–20)
BUN: 12 mg/dL (ref 6–24)
Bilirubin Total: 0.2 mg/dL (ref 0.0–1.2)
CALCIUM: 9.4 mg/dL (ref 8.7–10.2)
CO2: 25 mmol/L (ref 20–29)
CREATININE: 1.4 mg/dL — AB (ref 0.76–1.27)
Chloride: 103 mmol/L (ref 96–106)
GFR calc Af Amer: 68 mL/min/{1.73_m2} (ref >59)
GFR calc non Af Amer: 59 mL/min/{1.73_m2} — ABNORMAL LOW (ref >59)
Globulin, Total: 2.8 g/dL (ref 1.5–4.5)
Glucose: 115 mg/dL — ABNORMAL HIGH (ref 65–99)
Potassium: 4.3 mmol/L (ref 3.5–5.2)
Sodium: 142 mmol/L (ref 134–144)
Total Protein: 7.1 g/dL (ref 6.0–8.5)

## 2018-06-08 DIAGNOSIS — L0203 Carbuncle of face: Secondary | ICD-10-CM | POA: Diagnosis not present

## 2018-08-21 ENCOUNTER — Other Ambulatory Visit: Payer: Self-pay

## 2018-08-21 ENCOUNTER — Ambulatory Visit (HOSPITAL_COMMUNITY)
Admission: EM | Admit: 2018-08-21 | Discharge: 2018-08-21 | Disposition: A | Discharge status: Home / Self Care | Payer: Federal, State, Local not specified - PPO | Attending: Emergency Medicine | Admitting: Emergency Medicine

## 2018-08-21 ENCOUNTER — Encounter (HOSPITAL_COMMUNITY): Payer: Self-pay | Admitting: *Deleted

## 2018-08-21 DIAGNOSIS — S39012A Strain of muscle, fascia and tendon of lower back, initial encounter: Secondary | ICD-10-CM | POA: Diagnosis not present

## 2018-08-21 MED ORDER — CYCLOBENZAPRINE HCL 5 MG PO TABS: 5.0000 mg | ORAL_TABLET | Freq: Two times a day (BID) | ORAL | 0 refills | Status: DC | PRN

## 2018-08-21 MED ORDER — KETOROLAC TROMETHAMINE 30 MG/ML IJ SOLN
INTRAMUSCULAR | Status: AC
  Filled 2018-08-21: qty 1

## 2018-08-21 MED ORDER — KETOROLAC TROMETHAMINE 30 MG/ML IJ SOLN
30.0000 mg | Freq: Once | INTRAMUSCULAR | Status: AC
Start: 2018-08-21 — End: 2018-08-21
  Administered 2018-08-21: 30 mg via INTRAMUSCULAR

## 2018-08-21 MED ORDER — IBUPROFEN 800 MG PO TABS: 800.0000 mg | ORAL_TABLET | Freq: Three times a day (TID) | ORAL | 0 refills | Status: DC | PRN

## 2018-08-21 NOTE — Discharge Instructions (Signed)
We gave you an injection of Toradol today, this should begin working in 30 to 40 minutes Use anti-inflammatories for pain/swelling. You may take up to 800 mg Ibuprofen every 8 hours with food. You may supplement Ibuprofen with Tylenol [id number] mg every 8 hours.  You may use flexeril as needed to help with pain. This is a muscle relaxer and causes sedation- please use only at bedtime or when you will be home and not have to drive/work-begin with 1 tablet, may increase to 2 if needed Alternate ice and heat to back Avoid heavy lifting, but also avoid complete bedrest, please move around and go about daily activities  Please follow-up if developing worsening pain, weakness in legs, issues controlling urination or bowel movements, numbness or tingling, persistent pain

## 2018-08-21 NOTE — ED Provider Notes (Signed)
Angie    CSN: [id number] Arrival date & time: 08/21/18  1248     History   Chief Complaint Chief Complaint  Patient presents with  . Back Pain    HPI Corey [name] is a 53 y.o. male history of sleep apnea, hyperlipidemia, presenting today for evaluation of back pain.  Patient states that he has had left lower back pain for the past 2 days.  States that he woke up with this.  Denies any specific injury.  Does note that he often is doing heavy lifting and pushing and pulling motions while at work, but denies any inciting incident.  Pain waking him at night with a spasming sensation.  Denies numbness or tingling.  Denies radiation into leg.  Denies saddle anesthesia.  Denies loss of bowel or bladder control.  She has tried Advil PM as well as icy hot without relief.  He has had 2 previous back surgeries in his lumbar region including discectomy as well as a "T lift".  Most recent surgery was in 2010.  Denies chest pain or shortness of breath.  HPI  Past Medical History:  Diagnosis Date  . ED (erectile dysfunction)   . Hyperlipidemia   . Sleep apnea    CPAP    Patient Active Problem List   Diagnosis Date Noted  . Class 1 obesity due to excess calories without serious comorbidity with body mass index (BMI) of 32.0 to 32.9 in adult 09/06/2017  . Hyperlipidemia LDL goal <100 08/31/2016  . OSA (obstructive sleep apnea) 08/31/2016  . Premature ejaculation 08/19/2011    Past Surgical History:  Procedure Laterality Date  . ANKLE BONE SURGERY    . BACK SURGERY    . COLLAR BONE SURGEY    . HERNIA REPAIR    . KNEE ARTHROSCOPY Left 2006  . LUMBAR MICRODISCECTOMY    . UMBILICAL HERNIA REPAIR         Home Medications    Prior to Admission medications   Medication Sig Start Date End Date Taking? Authorizing Provider  PARoxetine (PAXIL) 20 MG tablet Take 1 tablet (20 mg total) by mouth every morning. 09/06/17  Yes Denita Lung, MD  cyclobenzaprine (FLEXERIL) 5  MG tablet Take 1-2 tablets (5-10 mg total) by mouth 2 (two) times daily as needed for muscle spasms. 08/21/18   Wieters, Hallie C, PA-C  ibuprofen (ADVIL,MOTRIN) 800 MG tablet Take 1 tablet (800 mg total) by mouth every 8 (eight) hours as needed. 08/21/18   Wieters, Hallie C, PA-C  Multiple Vitamins-Minerals (MULTIVITAMIN WITH MINERALS) tablet Take 1 tablet by mouth daily.    [provider]    Family History Family History  Problem Relation Age of Onset  . Alzheimer's disease Mother   . Stevens-Johnson syndrome Father   . Autism Brother     Social History Social History   Tobacco Use  . Smoking status: Never Smoker  . Smokeless tobacco: Never Used  Substance Use Topics  . Alcohol use: Yes    Alcohol/week: 3.0 standard drinks    Types: 3 Glasses of wine per week  . Drug use: Not Currently    Types: Marijuana     Allergies   Penicillins   Review of Systems Review of Systems  Constitutional: Negative for fatigue and fever.  Eyes: Negative for redness, itching and visual disturbance.  Respiratory: Negative for shortness of breath.   Cardiovascular: Negative for chest pain and leg swelling.  Gastrointestinal: Negative for nausea and vomiting.  Genitourinary: Negative for decreased urine volume and difficulty urinating.  Musculoskeletal: Positive for back pain and myalgias. Negative for arthralgias.  Skin: Negative for color change, rash and wound.  Neurological: Negative for dizziness, syncope, weakness, light-headedness and headaches.     Physical Exam Triage Vital Signs ED Triage Vitals  Enc Vitals Group     BP 08/21/18 1315 (!) 156/93     Pulse Rate 08/21/18 1315 (!) 112     Resp 08/21/18 1315 18     Temp 08/21/18 1315 98.1 F (36.7 C)     Temp Source 08/21/18 1315 Oral     SpO2 08/21/18 1315 100 %     Weight --      Height --      Head Circumference --      Peak Flow --      Pain Score 08/21/18 1329 8     Pain Loc --      Pain Edu? --      Excl.  in [place]? --    No data found.  Updated Vital Signs BP (!) 156/93 (BP Location: Left Arm)   Pulse (!) 112   Temp 98.1 F (36.7 C) (Oral)   Resp 18   SpO2 100%   Visual Acuity Right Eye Distance:   Left Eye Distance:   Bilateral Distance:    Right Eye Near:   Left Eye Near:    Bilateral Near:     Physical Exam Vitals signs and nursing note reviewed.  Constitutional:      Appearance: He is well-developed.  HENT:     Head: Normocephalic and atraumatic.  Eyes:     Conjunctiva/sclera: Conjunctivae normal.  Neck:     Musculoskeletal: Neck supple.  Cardiovascular:     Rate and Rhythm: Normal rate and regular rhythm.     Heart sounds: No murmur.  Pulmonary:     Effort: Pulmonary effort is normal. No respiratory distress.     Breath sounds: Normal breath sounds.  Abdominal:     Palpations: Abdomen is soft.     Tenderness: There is no abdominal tenderness.  Musculoskeletal:     Comments: Nontender to palpation of cervical, thoracic and lumbar spine midline, well-healed surgical scar over lumbar region as well as right lumbar region Nontender to palpation throughout bilateral lumbar musculature Negative straight leg raise bilaterally Strength 5/5 and equal bilaterally at hips and knees, patellar reflex 2+ Pain triggered in left lower back with resisted left hip flexion  Skin:    General: Skin is warm and dry.  Neurological:     Mental Status: He is alert.      UC Treatments / Results  Labs (all labs ordered are listed, but only abnormal results are displayed) Labs Reviewed - No data to display  EKG None  Radiology No results found.  Procedures Procedures (including critical care time)  Medications Ordered in UC Medications  ketorolac (TORADOL) 30 MG/ML injection 30 mg (has no administration in time range)    Initial Impression / Assessment and Plan / UC Course  I have reviewed the triage vital signs and the nursing notes.  Pertinent labs & imaging results  that were available during my care of the patient were reviewed by me and considered in my medical decision making (see chart for details).     Patient likely with lumbar strain/spasm.  No red flags for cauda equina.  Will treat with anti-inflammatories and muscle relaxers.  Does not have any radiation, do not suspect radiculopathy  at this time.  Flexeril and ibuprofen provided to use at home.  Discussed drowsiness regarding Flexeril.  Toradol prior to discharge.  Discussed activity modification.Discussed strict return precautions. Patient verbalized understanding and is agreeable with plan.  Final Clinical Impressions(s) / UC Diagnoses   Final diagnoses:  Strain of lumbar region, initial encounter     Discharge Instructions     We gave you an injection of Toradol today, this should begin working in 30 to 40 minutes Use anti-inflammatories for pain/swelling. You may take up to 800 mg Ibuprofen every 8 hours with food. You may supplement Ibuprofen with Tylenol [phone] mg every 8 hours.  You may use flexeril as needed to help with pain. This is a muscle relaxer and causes sedation- please use only at bedtime or when you will be home and not have to drive/work-begin with 1 tablet, may increase to 2 if needed Alternate ice and heat to back Avoid heavy lifting, but also avoid complete bedrest, please move around and go about daily activities  Please follow-up if developing worsening pain, weakness in legs, issues controlling urination or bowel movements, numbness or tingling, persistent pain   ED Prescriptions    Medication Sig Dispense Auth. Provider   ibuprofen (ADVIL,MOTRIN) 800 MG tablet Take 1 tablet (800 mg total) by mouth every 8 (eight) hours as needed. 30 tablet Wieters, Hallie C, PA-C   cyclobenzaprine (FLEXERIL) 5 MG tablet Take 1-2 tablets (5-10 mg total) by mouth 2 (two) times daily as needed for muscle spasms. 24 tablet Wieters, [place] C, PA-C     Controlled Substance  Prescriptions [place] Controlled Substance Registry consulted? Not Applicable   Janith Lima, [place] 08/21/18 1349

## 2018-08-21 NOTE — ED Triage Notes (Signed)
Reports starting with lower left back pain yesterday morning upon waking.  Denies injury.  Denies any parasthesias or radiation of pain.

## 2018-09-26 ENCOUNTER — Encounter: Payer: Self-pay | Admitting: Family Medicine

## 2018-09-26 ENCOUNTER — Other Ambulatory Visit: Payer: Self-pay

## 2018-09-26 ENCOUNTER — Ambulatory Visit: Payer: Federal, State, Local not specified - PPO | Admitting: Family Medicine

## 2018-09-26 VITALS — BP 130/88 | HR 85 | Temp 98.4°F | Ht 69.0 in | Wt 219.0 lb

## 2018-09-26 DIAGNOSIS — M25462 Effusion, left knee: Secondary | ICD-10-CM

## 2018-09-26 DIAGNOSIS — G4733 Obstructive sleep apnea (adult) (pediatric): Secondary | ICD-10-CM | POA: Diagnosis not present

## 2018-09-26 DIAGNOSIS — Z0000 Encounter for general adult medical examination without abnormal findings: Secondary | ICD-10-CM

## 2018-09-26 DIAGNOSIS — E785 Hyperlipidemia, unspecified: Secondary | ICD-10-CM | POA: Diagnosis not present

## 2018-09-26 DIAGNOSIS — E6609 Other obesity due to excess calories: Secondary | ICD-10-CM

## 2018-09-26 DIAGNOSIS — Z6832 Body mass index (BMI) 32.0-32.9, adult: Secondary | ICD-10-CM | POA: Diagnosis not present

## 2018-09-26 DIAGNOSIS — G479 Sleep disorder, unspecified: Secondary | ICD-10-CM

## 2018-09-26 DIAGNOSIS — M1990 Unspecified osteoarthritis, unspecified site: Secondary | ICD-10-CM | POA: Insufficient documentation

## 2018-09-26 DIAGNOSIS — B351 Tinea unguium: Secondary | ICD-10-CM

## 2018-09-26 DIAGNOSIS — R252 Cramp and spasm: Secondary | ICD-10-CM

## 2018-09-26 DIAGNOSIS — F524 Premature ejaculation: Secondary | ICD-10-CM

## 2018-09-26 DIAGNOSIS — R030 Elevated blood-pressure reading, without diagnosis of hypertension: Secondary | ICD-10-CM

## 2018-09-26 LAB — POCT URINALYSIS DIP (PROADVANTAGE DEVICE)
Bilirubin, UA: NEGATIVE
Blood, UA: NEGATIVE
Glucose, UA: NEGATIVE mg/dL
Leukocytes, UA: NEGATIVE
Nitrite, UA: NEGATIVE
Protein Ur, POC: NEGATIVE mg/dL
Specific Gravity, Urine: 1.02
Urobilinogen, Ur: 3.5
pH, UA: 6 (ref 5.0–8.0)

## 2018-09-26 MED ORDER — PAROXETINE HCL 20 MG PO TABS: 20.0000 mg | ORAL_TABLET | Freq: Every morning | ORAL | 3 refills | Status: DC

## 2018-09-26 MED ORDER — ZOLPIDEM TARTRATE 10 MG PO TABS: 10.0000 mg | ORAL_TABLET | Freq: Every evening | ORAL | 1 refills | Status: DC | PRN

## 2018-09-26 NOTE — Patient Instructions (Signed)
For general pain relief always start with Tylenol which would be to change 4 times per day and you can also add to that either Advil or Aleve.. The maximum dosing for Advil would be 4 tablets 3 times per day or 2 Aleve twice per day

## 2018-09-26 NOTE — Progress Notes (Signed)
   Subjective:    Patient ID: Corey [name], male    DOB: May 23, 1966, 53 y.o.   MRN: [id number]  HPI He is here for complete examination.  He has been working extra hours through his job at [employer].  He did go to the urgent care center and was given Flexeril as well as ibuprofen because of difficulty with aches and pains.  He does complain of left knee effusion and states that he has had trouble with his left knee since a meniscal injury several years ago.  His main complaint is leg cramping as well as back and knee discomfort.  He is also had back surgery.  He did bring up getting an FMLA to help with this.  He continues on Paxil which he is using for premature ejaculation.  Review of his medical record indicates elevated blood sugars as well as lipids.  Presently he is not on any medications for that or for blood pressure.  He does have OSA but presently is not using a CPAP stating difficulty with the mask as well as with sleep.  He also has concerns about the discoloration of his toes.  Family and social history as well as health maintenance and immunizations was reviewed   Review of Systems  All other systems reviewed and are negative.      Objective:   Physical Exam Alert and in no distress. Tympanic membranes and canals are normal. Pharyngeal area is normal. Neck is supple without adenopathy or thyromegaly. Cardiac exam shows a regular sinus rhythm without murmurs or gallops. Lungs are clear to auscultation.  Abdominal exam shows no masses or tenderness.  Left knee exam does show evidence of an effusion.  McMurray's and anterior drawer testing was negative.  Medial and lateral collateral ligaments are intact. Exam of his feet does show thickening and discoloration of several of his nails.      Assessment & Plan:  Routine general medical examination at a health care facility - Plan: POCT Urinalysis DIP (Proadvantage Device), CBC with Differential/Platelet, Comprehensive  metabolic panel, Lipid panel  Hyperlipidemia LDL goal <100 - Plan: Lipid panel  Class 1 obesity due to excess calories without serious comorbidity with body mass index (BMI) of 32.0 to 32.9 in adult - Plan: CBC with Differential/Platelet, Comprehensive metabolic panel, Lipid panel  OSA (obstructive sleep apnea)  Premature ejaculation - Plan: PARoxetine (PAXIL) 20 MG tablet  Effusion of left knee  Leg cramps  Sleep disturbance - Plan: zolpidem (AMBIEN) 10 MG tablet  Elevated blood pressure reading I explained that the blood pressure reading as well as OSA are probably interrelated.  Encouraged him to use the Ambien as well as a CPAP and see what effect this will have on his blood pressure.  Recheck here in 1 month concerning that. Recommend fluids, stretching exercises and using Tylenol and/or ibuprofen for the aches and pains that he is having that are probably work-related.  Explained that I could not get an FMLA since none of this is truly a serious medical illness. I will follow-up in 1 month on his blood pressure as well as his CPAP. Discussed the onychomycosis with him and explained that under the present circumstances, I would not recommend treatment.  He was comfortable with that.

## 2018-09-27 LAB — CBC WITH DIFFERENTIAL/PLATELET
Basophils Absolute: 0.1 10*3/uL (ref 0.0–0.2)
Basos: 1 %
EOS (ABSOLUTE): 0.3 10*3/uL (ref 0.0–0.4)
Eos: 6 %
Hematocrit: 42.8 % (ref 37.5–51.0)
Hemoglobin: 13.8 g/dL (ref 13.0–17.7)
Immature Grans (Abs): 0 10*3/uL (ref 0.0–0.1)
Immature Granulocytes: 0 %
Lymphocytes Absolute: 1.8 10*3/uL (ref 0.7–3.1)
Lymphs: 42 %
MCH: 27.3 pg (ref 26.6–33.0)
MCHC: 32.2 g/dL (ref 31.5–35.7)
MCV: 85 fL (ref 79–97)
Monocytes Absolute: 0.3 10*3/uL (ref 0.1–0.9)
Monocytes: 7 %
Neutrophils Absolute: 1.9 10*3/uL (ref 1.4–7.0)
Neutrophils: 44 %
Platelets: 314 10*3/uL (ref 150–450)
RBC: 5.06 x10E6/uL (ref 4.14–5.80)
RDW: 13.2 % (ref 11.6–15.4)
WBC: 4.3 10*3/uL (ref 3.4–10.8)

## 2018-09-27 LAB — COMPREHENSIVE METABOLIC PANEL
ALT: 25 IU/L (ref 0–44)
AST: 24 IU/L (ref 0–40)
Albumin/Globulin Ratio: 1.3 (ref 1.2–2.2)
Albumin: 4.3 g/dL (ref 4.0–5.0)
Alkaline Phosphatase: 69 IU/L (ref 39–117)
BUN/Creatinine Ratio: 13 (ref 9–20)
BUN: 17 mg/dL (ref 6–24)
Bilirubin Total: 0.2 mg/dL (ref 0.0–1.2)
CO2: 24 mmol/L (ref 20–29)
Calcium: 9.5 mg/dL (ref 8.7–10.2)
Chloride: 101 mmol/L (ref 96–106)
Creatinine, Ser: 1.32 mg/dL — ABNORMAL HIGH (ref 0.76–1.27)
GFR calc Af Amer: 72 mL/min/{1.73_m2} (ref >59)
GFR calc non Af Amer: 62 mL/min/{1.73_m2} (ref >59)
Globulin, Total: 3.3 g/dL (ref 1.5–4.5)
Glucose: 108 mg/dL — ABNORMAL HIGH (ref 65–99)
Potassium: 4.7 mmol/L (ref 3.5–5.2)
Sodium: 141 mmol/L (ref 134–144)
Total Protein: 7.6 g/dL (ref 6.0–8.5)

## 2018-09-27 LAB — LIPID PANEL
Chol/HDL Ratio: 4 ratio (ref 0.0–5.0)
Cholesterol, Total: 206 mg/dL — ABNORMAL HIGH (ref 100–199)
HDL: 52 mg/dL (ref >39)
LDL Calculated: 137 mg/dL — ABNORMAL HIGH (ref 0–99)
Triglycerides: 86 mg/dL (ref 0–149)
VLDL Cholesterol Cal: 17 mg/dL (ref 5–40)

## 2018-10-31 ENCOUNTER — Other Ambulatory Visit: Payer: Self-pay

## 2018-10-31 ENCOUNTER — Ambulatory Visit
Admission: RE | Admit: 2018-10-31 | Discharge: 2018-10-31 | Disposition: A | Discharge status: Home / Self Care | Payer: Federal, State, Local not specified - PPO | Source: Ambulatory Visit | Attending: Family Medicine | Admitting: Family Medicine

## 2018-10-31 ENCOUNTER — Ambulatory Visit: Payer: Federal, State, Local not specified - PPO | Admitting: Family Medicine

## 2018-10-31 ENCOUNTER — Encounter: Payer: Self-pay | Admitting: Family Medicine

## 2018-10-31 VITALS — BP 128/88 | HR 78 | Temp 97.6°F | Wt 217.9 lb

## 2018-10-31 DIAGNOSIS — G4733 Obstructive sleep apnea (adult) (pediatric): Secondary | ICD-10-CM | POA: Diagnosis not present

## 2018-10-31 DIAGNOSIS — R030 Elevated blood-pressure reading, without diagnosis of hypertension: Secondary | ICD-10-CM | POA: Diagnosis not present

## 2018-10-31 DIAGNOSIS — M25462 Effusion, left knee: Secondary | ICD-10-CM | POA: Diagnosis not present

## 2018-10-31 DIAGNOSIS — M1712 Unilateral primary osteoarthritis, left knee: Secondary | ICD-10-CM | POA: Diagnosis not present

## 2018-10-31 MED ORDER — LIDOCAINE-EPINEPHRINE (PF) 1 %-1:200000 IJ SOLN
10.0000 mL | Freq: Once | INTRAMUSCULAR | Status: AC
  Administered 2018-10-31: 10 mL via INTRADERMAL

## 2018-10-31 MED ORDER — TRIAMCINOLONE ACETONIDE 40 MG/ML IJ SUSP
40.0000 mg | Freq: Once | INTRAMUSCULAR | Status: AC
  Administered 2018-10-31: 40 mg via INTRAMUSCULAR

## 2018-10-31 NOTE — Addendum Note (Signed)
Addended by: Elyse Jarvis on: 10/31/2018 01:20 PM   Modules accepted: Orders

## 2018-10-31 NOTE — Progress Notes (Signed)
   Subjective:    Patient ID: DECKARD STUBER, male    DOB: 01/04/1966, 53 y.o.   MRN: [id number]  HPI He is here for recheck.  He is now using his CPAP and getting good results with that with more energy and waking up refreshed. He has a long history of difficulty with left knee pain including surgery as well as apparently having injections in the past.  He has been using anti-inflammatory regularly without good results.  And he complains of swelling and pain.   Review of Systems     Objective:   Physical Exam Alert and in no distress.  Blood pressure is recorded.  Knee exam does show an effusion with pain with McMurray's testing but not localized.  Negative anterior drawer.  Medial and lateral collateral ligaments intact.       Assessment & Plan:  Effusion of left knee - Plan: DG Knee Complete 4 Views Left  OSA (obstructive sleep apnea)  Elevated blood pressure reading I discussed options concerning his knee.  We have decided to go ahead and give him an injection and also order x-rays.  Hopefully the injection will last for several months however explained that if it did not, referral to orthopedics would certainly be in order. Then discussed elevated blood pressure in regard to physical activity which is what is keeping quite physically active and also cutting back on carbohydrates to help with his weight.  Recheck blood pressure in 3 months.

## 2019-01-31 ENCOUNTER — Ambulatory Visit (INDEPENDENT_AMBULATORY_CARE_PROVIDER_SITE_OTHER): Payer: Federal, State, Local not specified - PPO | Admitting: Family Medicine

## 2019-01-31 ENCOUNTER — Encounter: Payer: Self-pay | Admitting: Family Medicine

## 2019-01-31 ENCOUNTER — Other Ambulatory Visit: Payer: Self-pay

## 2019-01-31 VITALS — BP 140/94 | HR 84 | Temp 98.4°F | Wt 213.8 lb

## 2019-01-31 DIAGNOSIS — M25462 Effusion, left knee: Secondary | ICD-10-CM

## 2019-01-31 DIAGNOSIS — I10 Essential (primary) hypertension: Secondary | ICD-10-CM

## 2019-01-31 MED ORDER — AMLODIPINE BESYLATE 5 MG PO TABS: 5.0000 mg | ORAL_TABLET | Freq: Every day | ORAL | 3 refills | Status: DC

## 2019-01-31 NOTE — Progress Notes (Signed)
   Subjective:    Patient ID: Corey [name], male    DOB: 23-Apr-1966, 53 y.o.   MRN: [id number]  HPI He is here for a recheck.  He did have elevated blood pressure in the past and today's visit is for that as well as for his knee pain.  He does continue to have difficulty with some discomfort in his knee as well as some swelling.  He is not at this point willing to have anything further done for his knee.   Review of Systems     Objective:   Physical Exam  Alert and in no distress.  Blood pressure is recorded.      Assessment & Plan:  Essential hypertension - Plan: amLODipine (NORVASC) 5 MG tablet  Effusion of left knee I will place him on amlodipine and have him return here in 1 month.  He gets a blood pressure cuff, he should bring it in.  Also discussed the need for him to make diet and exercise changes.  Discussed cutting back on carbohydrates as well as physical activity of 20 minutes daily or 150 minutes a week of something physical.  Did discuss the fact that if he does a good job with his weight loss and exercise, we can possibly consider stopping the blood pressure medication.

## 2019-02-27 ENCOUNTER — Ambulatory Visit: Payer: Federal, State, Local not specified - PPO | Admitting: Family Medicine

## 2019-02-27 ENCOUNTER — Other Ambulatory Visit: Payer: Self-pay

## 2019-02-27 ENCOUNTER — Encounter: Payer: Self-pay | Admitting: Family Medicine

## 2019-02-27 VITALS — BP 120/82 | HR 86 | Temp 97.5°F | Wt 207.0 lb

## 2019-02-27 DIAGNOSIS — Z6379 Other stressful life events affecting family and household: Secondary | ICD-10-CM | POA: Diagnosis not present

## 2019-02-27 DIAGNOSIS — L723 Sebaceous cyst: Secondary | ICD-10-CM

## 2019-02-27 DIAGNOSIS — R030 Elevated blood-pressure reading, without diagnosis of hypertension: Secondary | ICD-10-CM

## 2019-02-27 MED ORDER — LIDOCAINE-EPINEPHRINE 2 %-1:100000 IJ SOLN
1.7000 mL | Freq: Once | INTRAMUSCULAR | Status: AC
  Administered 2019-02-27: 1.7 mL via INTRADERMAL

## 2019-02-27 NOTE — Patient Instructions (Addendum)
Call family and children services at [phone] Maudie Mercury will work with you on getting your blood pressure cuff checked Check your blood pressure weekly for the next month and then send me the information

## 2019-02-27 NOTE — Progress Notes (Signed)
   Subjective:    Patient ID: Corey [name], male    DOB: [birth year], 53 y.o.   MRN: [id number]  HPI He is here for a blood pressure check.  He did not start taking the Norvasc and did make changes in his diet and exercise.  He also has a lesion on his anterior chest that is draining foul-smelling material if he would like further evaluated. He also has been under a lot of stress.  He is now taking care of his grandchildren.  Apparently the mother is in a drug rehab program and we gone for at least 45 days.   Review of Systems     Objective:   Physical Exam Alert and in no distress with appropriate affect.  Blood pressure is recorded.  He has a 3 cm round smooth lesion present on his left anterior chest.  No drainage is noted.  It is not erythematous or warm.      Assessment & Plan:  Elevated blood pressure reading  Sebaceous cyst - Plan: lidocaine-EPINEPHrine (XYLOCAINE W/EPI) 2 %-1:100000 (with pres) injection 1.7 mL  Stressful life event affecting family He would like the lesion on his chest removed since it is draining foul-smelling material. It was injected with xylocaine and epi. A 2 cm incision was made. The cyst material was expressed and the sac was removed in toto.  A Steri-Strip was applied. He is to bring in his blood pressure cuff to measure it against ours and keep me informed as to his blood pressure.    I then spent time discussing the children he needs taking care of.  They are 2, 12 and 15.  Recommended that he call family and children services to have them help with their care since he technically is not truly their grandfather nor does he have legal custody of them at the present time.  Apparently the daughter did give a letter to that effect to him.  Over 25%, greater than 50% spent in counseling and coordination of care.

## 2019-04-17 ENCOUNTER — Other Ambulatory Visit: Payer: Self-pay

## 2019-04-17 ENCOUNTER — Ambulatory Visit: Payer: Federal, State, Local not specified - PPO | Admitting: Podiatry

## 2019-04-17 ENCOUNTER — Encounter: Payer: Self-pay | Admitting: Podiatry

## 2019-04-17 VITALS — BP 142/80 | HR 99 | Resp 16

## 2019-04-17 DIAGNOSIS — B351 Tinea unguium: Secondary | ICD-10-CM

## 2019-04-17 NOTE — Patient Instructions (Addendum)
Onychomycosis/Fungal Toenails  WHAT IS IT? An infection that lies within the keratin of your nail plate that is caused by a fungus.  WHY ME? Fungal infections affect all ages, sexes, races, and creeds.  There may be many factors that predispose you to a fungal infection such as age, coexisting medical conditions such as diabetes, or an autoimmune disease; stress, medications, fatigue, genetics, etc.  Bottom line: fungus thrives in a warm, moist environment and your shoes offer such a location.  IS IT CONTAGIOUS? Theoretically, yes.  You do not want to share shoes, nail clippers or files with someone who has fungal toenails.  Walking around barefoot in the same room or sleeping in the same bed is unlikely to transfer the organism.  It is important to realize, however, that fungus can spread easily from one nail to the next on the same foot.  HOW DO WE TREAT THIS?  There are several ways to treat this condition.  Treatment may depend on many factors such as age, medications, pregnancy, liver and kidney conditions, etc.  It is best to ask your doctor which options are available to you.  1. No treatment.   Unlike many other medical concerns, you can live with this condition.  However for many people this can be a painful condition and may lead to ingrown toenails or a bacterial infection.  It is recommended that you keep the nails cut short to help reduce the amount of fungal nail. 2. Topical treatment.  These range from herbal remedies to prescription strength nail lacquers.  About 40-50% effective, topicals require twice daily application for approximately 9 to 12 months or until an entirely new nail has grown out.  The most effective topicals are medical grade medications available through physicians offices. 3. Oral antifungal medications.  With an 80-90% cure rate, the most common oral medication requires 3 to 4 months of therapy and stays in your system for a year as the new nail grows out.  Oral  antifungal medications do require blood work to make sure it is a safe drug for you.  A liver function panel will be performed prior to starting the medication and after the first month of treatment.  It is important to have the blood work performed to avoid any harmful side effects.  In general, this medication safe but blood work is required. 4. Laser Therapy.  This treatment is performed by applying a specialized laser to the affected nail plate.  This therapy is noninvasive, fast, and non-painful.  It is not covered by insurance and is therefore, out of pocket.  The results have been very good with a 80-95% cure rate.  The [facility] is the only practice in the area to offer this therapy. 5. Permanent Nail Avulsion.  Removing the entire nail so that a new nail will not grow back.  Terbinafine oral granules What is this medicine? TERBINAFINE (TER bin a feen) is an antifungal medicine. It is used to treat certain kinds of fungal or yeast infections. This medicine may be used for other purposes; ask your health care provider or pharmacist if you have questions. COMMON BRAND NAME(S): Lamisil What should I tell my health care provider before I take this medicine? They need to know if you have any of these conditions:  drink alcoholic beverages  kidney disease  liver disease  an unusual or allergic reaction to Terbinafine, other medicines, foods, dyes, or preservatives  pregnant or trying to get pregnant  breast-feeding How  should I use this medicine? Take this medicine by mouth. Follow the directions on the prescription label. Hold packet with cut line on top. Shake packet gently to settle contents. Tear packet open along cut line, or use scissors to cut across line. Carefully pour the entire contents of packet onto a spoonful of a soft food, such as pudding or other soft, non-acidic food such as mashed potatoes (do NOT use applesauce or a fruit-based food). If two packets are required  for each dose, you may either sprinkle the content of both packets on one spoonful of non-acidic food, or sprinkle the contents of both packets on two spoonfuls of non-acidic food. Make sure that no granules remain in the packet. Swallow the mxiture of the food and granules without chewing. Take your medicine at regular intervals. Do not take it more often than directed. Take all of your medicine as directed even if you think you are better. Do not skip doses or stop your medicine early. Contact your pediatrician or health care professional regarding the use of this medicine in children. While this medicine may be prescribed for children as young as 4 years for selected conditions, precautions do apply. Overdosage: If you think you have taken too much of this medicine contact a poison control center or emergency room at once. NOTE: This medicine is only for you. Do not share this medicine with others. What if I miss a dose? If you miss a dose, take it as soon as you can. If it is almost time for your next dose, take only that dose. Do not take double or extra doses. What may interact with this medicine? Do not take this medicine with any of the following medications:  thioridazine This medicine may also interact with the following medications:  beta-blockers  caffeine  cimetidine  cyclosporine  MAOIs like Carbex, Eldepryl, Marplan, Nardil, and Parnate  medicines for fungal infections like fluconazole and ketoconazole  medicines for irregular heartbeat like amiodarone, flecainide and propafenone  rifampin  SSRIs like citalopram, escitalopram, fluoxetine, fluvoxamine, paroxetine and sertraline  tricyclic antidepressants like amitriptyline, clomipramine, desipramine, imipramine, nortriptyline, and others  warfarin This list may not describe all possible interactions. Give your health care provider a list of all the medicines, herbs, non-prescription drugs, or dietary supplements you  use. Also tell them if you smoke, drink alcohol, or use illegal drugs. Some items may interact with your medicine. What should I watch for while using this medicine? Your doctor may monitor your liver function. Tell your doctor right away if you have nausea or vomiting, loss of appetite, stomach pain on your right upper side, yellow skin, dark urine, light stools, or are over tired. This medicine may cause serious skin reactions. They can happen weeks to months after starting the medicine. Contact your health care provider right away if you notice fevers or flu-like symptoms with a rash. The rash may be red or purple and then turn into blisters or peeling of the skin. Or, you might notice a red rash with swelling of the face, lips or lymph nodes in your neck or under your arms. You need to take this medicine for 6 weeks or longer to cure the fungal infection. Take your medicine regularly for as long as your doctor or health care provider tells you to. What side effects may I notice from receiving this medicine? Side effects that you should report to your doctor or health care professional as soon as possible:  allergic reactions like skin  rash or hives, swelling of the face, lips, or tongue  change in vision  dark urine  fever or infection  general ill feeling or flu-like symptoms  light-colored stools  loss of appetite, nausea  rash, fever, and swollen lymph nodes  redness, blistering, peeling or loosening of the skin, including inside the mouth  right upper belly pain  unusually weak or tired  yellowing of the eyes or skin Side effects that usually do not require medical attention (report to your doctor or health care professional if they continue or are bothersome):  changes in taste  diarrhea  hair loss  muscle or joint pain  stomach upset This list may not describe all possible side effects. Call your doctor for medical advice about side effects. You may report side  effects to FDA at 1-800-FDA-1088. Where should I keep my medicine? Keep out of the reach of children. Store at room temperature between 15 and 30 degrees C (59 and 86 degrees F). Throw away any unused medicine after the expiration date. NOTE: This sheet is a summary. It may not cover all possible information. If you have questions about this medicine, talk to your doctor, pharmacist, or health care provider.  2020 Elsevier/Gold Standard (2018-08-26 15:35:11)

## 2019-04-24 NOTE — Progress Notes (Signed)
Subjective:   Patient ID: Corey [name], male   DOB: 53 y.o.   MRN: [id number]   HPI 53 year old male presents the office with concerns of his toes becoming thickened discolored and has noticed dark discoloration to most of his toenails.  He has no pain in the nails he denies any redness or drainage or any swelling.  He said no recent treatment.  He has no other concerns today.   Review of Systems  All other systems reviewed and are negative.  Past Medical History:  Diagnosis Date  . ED (erectile dysfunction)   . Hyperlipidemia   . Sleep apnea    CPAP    Past Surgical History:  Procedure Laterality Date  . ANKLE BONE SURGERY    . BACK SURGERY    . COLLAR BONE SURGEY    . HERNIA REPAIR    . KNEE ARTHROSCOPY Left 2006  . LUMBAR MICRODISCECTOMY    . UMBILICAL HERNIA REPAIR       Current Outpatient Medications:  .  ibuprofen (ADVIL,MOTRIN) 800 MG tablet, Take 1 tablet (800 mg total) by mouth every 8 (eight) hours as needed. (Patient not taking: Reported on 09/26/2018), Disp: 30 tablet, Rfl: 0 .  Multiple Vitamins-Minerals (MULTIVITAMIN WITH MINERALS) tablet, Take 1 tablet by mouth daily., Disp: , Rfl:  .  PARoxetine (PAXIL) 20 MG tablet, Take 1 tablet (20 mg total) by mouth every morning., Disp: 90 tablet, Rfl: 3  Allergies  Allergen Reactions  . Penicillins Other (See Comments)    Makes pt black out         Objective:  Physical Exam  General: AAO x3, NAD  Dermatological: Nails appear to be hypertrophic, dystrophic with yellow-brown discoloration is also dark discoloration.  Most notable digits of bilateral first, fifth on the right fourth digit.  There is no open lesions.  Vascular: Dorsalis Pedis artery and Posterior Tibial artery pedal pulses are 2/4 bilateral with immedate capillary fill time. There is no pain with calf compression, swelling, warmth, erythema.   Neruologic: Grossly intact via light touch bilateral. Protective threshold with Semmes Wienstein  monofilament intact to all pedal sites bilateral.   Musculoskeletal: No gross boney pedal deformities bilateral. No pain, crepitus, or limitation noted with foot and ankle range of motion bilateral. Muscular strength 5/5 in all groups tested bilateral.  Gait: Unassisted, Nonantalgic.       Assessment:   Onychodystrophy, likely onychomycosis     Plan:  -Treatment options discussed including all alternatives, risks, and complications -Etiology of symptoms were discussed -Today debrided the nails and send it for culture, biopsy to [facility] labs.  We discussed treatment options for nail fungus most likely needs to do oral medication given the thickening the nails.  Discussed Lamisil discussed side effects medication.  He will consider his options and I will let him know when the results of the biopsy are returned.  Trula Slade DPM

## 2019-04-24 NOTE — Addendum Note (Signed)
Addended by: Cranford Mon R on: 04/24/2019 08:43 AM   Modules accepted: Orders

## 2019-05-22 ENCOUNTER — Ambulatory Visit: Payer: Federal, State, Local not specified - PPO | Admitting: Podiatry

## 2019-05-22 ENCOUNTER — Other Ambulatory Visit: Payer: Self-pay

## 2019-05-22 DIAGNOSIS — B351 Tinea unguium: Secondary | ICD-10-CM | POA: Diagnosis not present

## 2019-05-22 DIAGNOSIS — Z79899 Other long term (current) drug therapy: Secondary | ICD-10-CM | POA: Diagnosis not present

## 2019-05-22 NOTE — Patient Instructions (Signed)
Terbinafine oral granules What is this medicine? TERBINAFINE (TER bin a feen) is an antifungal medicine. It is used to treat certain kinds of fungal or yeast infections. This medicine may be used for other purposes; ask your health care provider or pharmacist if you have questions. COMMON BRAND NAME(S): Lamisil What should I tell my health care provider before I take this medicine? They need to know if you have any of these conditions:  drink alcoholic beverages  kidney disease  liver disease  an unusual or allergic reaction to Terbinafine, other medicines, foods, dyes, or preservatives  pregnant or trying to get pregnant  breast-feeding How should I use this medicine? Take this medicine by mouth. Follow the directions on the prescription label. Hold packet with cut line on top. Shake packet gently to settle contents. Tear packet open along cut line, or use scissors to cut across line. Carefully pour the entire contents of packet onto a spoonful of a soft food, such as pudding or other soft, non-acidic food such as mashed potatoes (do NOT use applesauce or a fruit-based food). If two packets are required for each dose, you may either sprinkle the content of both packets on one spoonful of non-acidic food, or sprinkle the contents of both packets on two spoonfuls of non-acidic food. Make sure that no granules remain in the packet. Swallow the mxiture of the food and granules without chewing. Take your medicine at regular intervals. Do not take it more often than directed. Take all of your medicine as directed even if you think you are better. Do not skip doses or stop your medicine early. Contact your pediatrician or health care professional regarding the use of this medicine in children. While this medicine may be prescribed for children as young as 4 years for selected conditions, precautions do apply. Overdosage: If you think you have taken too much of this medicine contact a poison control  center or emergency room at once. NOTE: This medicine is only for you. Do not share this medicine with others. What if I miss a dose? If you miss a dose, take it as soon as you can. If it is almost time for your next dose, take only that dose. Do not take double or extra doses. What may interact with this medicine? Do not take this medicine with any of the following medications:  thioridazine This medicine may also interact with the following medications:  beta-blockers  caffeine  cimetidine  cyclosporine  MAOIs like Carbex, Eldepryl, Marplan, Nardil, and Parnate  medicines for fungal infections like fluconazole and ketoconazole  medicines for irregular heartbeat like amiodarone, flecainide and propafenone  rifampin  SSRIs like citalopram, escitalopram, fluoxetine, fluvoxamine, paroxetine and sertraline  tricyclic antidepressants like amitriptyline, clomipramine, desipramine, imipramine, nortriptyline, and others  warfarin This list may not describe all possible interactions. Give your health care provider a list of all the medicines, herbs, non-prescription drugs, or dietary supplements you use. Also tell them if you smoke, drink alcohol, or use illegal drugs. Some items may interact with your medicine. What should I watch for while using this medicine? Your doctor may monitor your liver function. Tell your doctor right away if you have nausea or vomiting, loss of appetite, stomach pain on your right upper side, yellow skin, dark urine, light stools, or are over tired. This medicine may cause serious skin reactions. They can happen weeks to months after starting the medicine. Contact your health care provider right away if you notice fevers or flu-like symptoms   with a rash. The rash may be red or purple and then turn into blisters or peeling of the skin. Or, you might notice a red rash with swelling of the face, lips or lymph nodes in your neck or under your arms. You need to take  this medicine for 6 weeks or longer to cure the fungal infection. Take your medicine regularly for as long as your doctor or health care provider tells you to. What side effects may I notice from receiving this medicine? Side effects that you should report to your doctor or health care professional as soon as possible:  allergic reactions like skin rash or hives, swelling of the face, lips, or tongue  change in vision  dark urine  fever or infection  general ill feeling or flu-like symptoms  light-colored stools  loss of appetite, nausea  rash, fever, and swollen lymph nodes  redness, blistering, peeling or loosening of the skin, including inside the mouth  right upper belly pain  unusually weak or tired  yellowing of the eyes or skin Side effects that usually do not require medical attention (report to your doctor or health care professional if they continue or are bothersome):  changes in taste  diarrhea  hair loss  muscle or joint pain  stomach upset This list may not describe all possible side effects. Call your doctor for medical advice about side effects. You may report side effects to FDA at 1-800-FDA-1088. Where should I keep my medicine? Keep out of the reach of children. Store at room temperature between 15 and 30 degrees C (59 and 86 degrees F). Throw away any unused medicine after the expiration date. NOTE: This sheet is a summary. It may not cover all possible information. If you have questions about this medicine, talk to your doctor, pharmacist, or health care provider.  2020 Elsevier/Gold Standard (2018-08-26 15:35:11)  

## 2019-05-29 DIAGNOSIS — Z79899 Other long term (current) drug therapy: Secondary | ICD-10-CM | POA: Diagnosis not present

## 2019-05-29 NOTE — Progress Notes (Signed)
Subjective: 53 year old male presents the office for evaluation of nail fungus and discuss treatment options.  He states this is been ongoing for several years he has no pain.Denies any systemic complaints such as fevers, chills, nausea, vomiting. No acute changes since last appointment, and no other complaints at this time.   Objective: AAO x3, NAD DP/PT pulses palpable bilaterally, CRT less than 3 seconds Nails are hypertrophic, dystrophic with yellow to brown discoloration.  There is also dark discoloration on bilateral first, fifth digits on the right fourth digit.  There is no extension of hyperpigmentation surrounding skin.  No signs of bacterial infection. No open lesions or pre-ulcerative lesions.  No pain with calf compression, swelling, warmth, erythema  Assessment: Onychomycosis with hyperpigmented nails.  Plan: -All treatment options discussed with the patient including all alternatives, risks, complications.  -Discussed treatment for toenail fungus.  We will start with Lamisil after discussing options he wishes to proceed with this.  We will check a CBC and LFT.  Once this comes back I will order the medication for him peer discussed side effects monitor closely. -Melanin pigment he has a multiple nails.  The specimen previously had all the nails combined.  I did individually today send the nails for biopsy to see if the melanin pigment was more on unspecified toenail.  Discussed he may need further biopsy but will start with this. -Patient encouraged to call the office with any questions, concerns, change in symptoms.   Trula Slade DPM

## 2019-05-30 ENCOUNTER — Other Ambulatory Visit: Payer: Self-pay | Admitting: Podiatry

## 2019-05-30 LAB — CBC WITH DIFFERENTIAL/PLATELET
Absolute Monocytes: 343 cells/uL (ref 200–950)
Basophils Absolute: 42 cells/uL (ref 0–200)
Basophils Relative: 0.8 %
Eosinophils Absolute: 182 cells/uL (ref 15–500)
Eosinophils Relative: 3.5 %
HCT: 45.1 % (ref 38.5–50.0)
Hemoglobin: 14.6 g/dL (ref 13.2–17.1)
Lymphs Abs: 2205 cells/uL (ref 850–3900)
MCH: 27.3 pg (ref 27.0–33.0)
MCHC: 32.4 g/dL (ref 32.0–36.0)
MCV: 84.5 fL (ref 80.0–100.0)
MPV: 9.8 fL (ref 7.5–12.5)
Monocytes Relative: 6.6 %
Neutro Abs: 2428 cells/uL (ref 1500–7800)
Neutrophils Relative %: 46.7 %
Platelets: 299 10*3/uL (ref 140–400)
RBC: 5.34 10*6/uL (ref 4.20–5.80)
RDW: 13.2 % (ref 11.0–15.0)
Total Lymphocyte: 42.4 %
WBC: 5.2 10*3/uL (ref 3.8–10.8)

## 2019-05-30 LAB — HEPATIC FUNCTION PANEL
AG Ratio: 1.5 (calc) (ref 1.0–2.5)
ALT: 18 U/L (ref 9–46)
AST: 18 U/L (ref 10–35)
Albumin: 4.4 g/dL (ref 3.6–5.1)
Alkaline phosphatase (APISO): 55 U/L (ref 35–144)
Bilirubin, Direct: 0.1 mg/dL (ref 0.0–0.2)
Globulin: 2.9 g/dL (calc) (ref 1.9–3.7)
Indirect Bilirubin: 0.2 mg/dL (calc) (ref 0.2–1.2)
Total Bilirubin: 0.3 mg/dL (ref 0.2–1.2)
Total Protein: 7.3 g/dL (ref 6.1–8.1)

## 2019-05-30 MED ORDER — TERBINAFINE HCL 250 MG PO TABS: 250.0000 mg | ORAL_TABLET | Freq: Every day | ORAL | 0 refills | Status: DC

## 2019-05-30 NOTE — Progress Notes (Signed)
I called and spoke with the patient and relayed the message per Dr Jacqualyn Posey that the blood work is normal and to get the RX from the pharmacy and recheck the blood work in 6 weeks. Corey [name]

## 2019-06-21 DIAGNOSIS — G4733 Obstructive sleep apnea (adult) (pediatric): Secondary | ICD-10-CM | POA: Diagnosis not present

## 2019-06-29 DIAGNOSIS — G4733 Obstructive sleep apnea (adult) (pediatric): Secondary | ICD-10-CM | POA: Diagnosis not present

## 2019-07-14 DIAGNOSIS — G4733 Obstructive sleep apnea (adult) (pediatric): Secondary | ICD-10-CM | POA: Diagnosis not present

## 2019-08-07 ENCOUNTER — Other Ambulatory Visit: Payer: Self-pay

## 2019-08-07 ENCOUNTER — Ambulatory Visit: Payer: Federal, State, Local not specified - PPO | Admitting: Podiatry

## 2019-08-07 ENCOUNTER — Encounter: Payer: Self-pay | Admitting: Podiatry

## 2019-08-07 VITALS — Temp 96.8°F | Resp 16

## 2019-08-07 DIAGNOSIS — Z79899 Other long term (current) drug therapy: Secondary | ICD-10-CM

## 2019-08-07 DIAGNOSIS — B351 Tinea unguium: Secondary | ICD-10-CM | POA: Diagnosis not present

## 2019-08-07 MED ORDER — TERBINAFINE HCL 250 MG PO TABS: 250.0000 mg | ORAL_TABLET | Freq: Every day | ORAL | 0 refills | Status: DC

## 2019-08-07 NOTE — Progress Notes (Signed)
Subjective: 54 year old male presents the office today for follow-up evaluation of nail fungus.  He states he is doing well any side effects of the Lamisil.  Not seen drastic improvement but he has noticed the color to the nails have become lighter.  No pain to the nails currently.Denies any systemic complaints such as fevers, chills, nausea, vomiting. No acute changes since last appointment, and no other complaints at this time.   Objective: AAO x3, NAD DP/PT pulses palpable bilaterally, CRT less than 3 seconds Overall majority of nails are still hypertrophic, dystrophic with the discoloration.  There is clearing on the proximal nail borders however.  There is no extension of any hyperpigmentation into the surrounding skin. No pain with calf compression, swelling, warmth, erythema  Assessment: Onychomycosis, currently on Lamisil  Plan: -All treatment options discussed with the patient including all alternatives, risks, complications.  -There is mild improvement.  Continue Lamisil.  We will recheck a CBC and LFT.  If normal will continue Lamisil for additional 30 days.  Continue to monitor any side effects.  If no improvement discussed itraconazole. -Patient encouraged to call the office with any questions, concerns, change in symptoms.   Trula Slade DPM

## 2019-08-08 ENCOUNTER — Telehealth: Payer: Self-pay | Admitting: *Deleted

## 2019-08-08 DIAGNOSIS — Z79899 Other long term (current) drug therapy: Secondary | ICD-10-CM

## 2019-08-08 LAB — CBC WITH DIFFERENTIAL/PLATELET
Absolute Monocytes: 300 cells/uL (ref 200–950)
Basophils Absolute: 31 cells/uL (ref 0–200)
Basophils Relative: 0.8 %
Eosinophils Absolute: 148 cells/uL (ref 15–500)
Eosinophils Relative: 3.8 %
HCT: 40.7 % (ref 38.5–50.0)
Hemoglobin: 13.2 g/dL (ref 13.2–17.1)
Lymphs Abs: 1860 cells/uL (ref 850–3900)
MCH: 27.6 pg (ref 27.0–33.0)
MCHC: 32.4 g/dL (ref 32.0–36.0)
MCV: 85.1 fL (ref 80.0–100.0)
MPV: 9.1 fL (ref 7.5–12.5)
Monocytes Relative: 7.7 %
Neutro Abs: 1560 cells/uL (ref 1500–7800)
Neutrophils Relative %: 40 %
Platelets: 316 10*3/uL (ref 140–400)
RBC: 4.78 10*6/uL (ref 4.20–5.80)
RDW: 13 % (ref 11.0–15.0)
Total Lymphocyte: 47.7 %
WBC: 3.9 10*3/uL (ref 3.8–10.8)

## 2019-08-08 LAB — HEPATIC FUNCTION PANEL
AG Ratio: 1.5 (calc) (ref 1.0–2.5)
ALT: 38 U/L (ref 9–46)
AST: 36 U/L — ABNORMAL HIGH (ref 10–35)
Albumin: 4.2 g/dL (ref 3.6–5.1)
Alkaline phosphatase (APISO): 53 U/L (ref 35–144)
Bilirubin, Direct: 0.1 mg/dL (ref 0.0–0.2)
Globulin: 2.8 g/dL (calc) (ref 1.9–3.7)
Indirect Bilirubin: 0.2 mg/dL (calc) (ref 0.2–1.2)
Total Bilirubin: 0.3 mg/dL (ref 0.2–1.2)
Total Protein: 7 g/dL (ref 6.1–8.1)

## 2019-08-08 NOTE — Telephone Encounter (Signed)
I informed pt of Dr. Leigh Aurora review of results and orders. Mailed a copy of the LFT orders to pt as a reminder to perform in 2 weeks.

## 2019-08-08 NOTE — Telephone Encounter (Signed)
-----   Message from Trula Slade, DPM sent at 08/08/2019  8:05 AM EST ----- Val- let him know that one of the liver enzymes are very minimally elevated. I would stop the Lamisil for now and recheck the LFT in about 2 weeks. Can you please let him know and order the labs. Thank you.

## 2019-08-11 DIAGNOSIS — G4733 Obstructive sleep apnea (adult) (pediatric): Secondary | ICD-10-CM | POA: Diagnosis not present

## 2019-09-11 DIAGNOSIS — G4733 Obstructive sleep apnea (adult) (pediatric): Secondary | ICD-10-CM | POA: Diagnosis not present

## 2019-09-18 DIAGNOSIS — G4733 Obstructive sleep apnea (adult) (pediatric): Secondary | ICD-10-CM | POA: Diagnosis not present

## 2019-10-02 ENCOUNTER — Other Ambulatory Visit: Payer: Self-pay

## 2019-10-02 ENCOUNTER — Encounter: Payer: Self-pay | Admitting: Family Medicine

## 2019-10-02 ENCOUNTER — Ambulatory Visit: Payer: Federal, State, Local not specified - PPO | Admitting: Family Medicine

## 2019-10-02 VITALS — BP 140/94 | HR 85 | Temp 96.8°F | Wt 221.0 lb

## 2019-10-02 DIAGNOSIS — Z6832 Body mass index (BMI) 32.0-32.9, adult: Secondary | ICD-10-CM

## 2019-10-02 DIAGNOSIS — E785 Hyperlipidemia, unspecified: Secondary | ICD-10-CM

## 2019-10-02 DIAGNOSIS — F524 Premature ejaculation: Secondary | ICD-10-CM | POA: Diagnosis not present

## 2019-10-02 DIAGNOSIS — B351 Tinea unguium: Secondary | ICD-10-CM

## 2019-10-02 DIAGNOSIS — Z9989 Dependence on other enabling machines and devices: Secondary | ICD-10-CM

## 2019-10-02 DIAGNOSIS — R030 Elevated blood-pressure reading, without diagnosis of hypertension: Secondary | ICD-10-CM

## 2019-10-02 DIAGNOSIS — M1990 Unspecified osteoarthritis, unspecified site: Secondary | ICD-10-CM

## 2019-10-02 DIAGNOSIS — Z0000 Encounter for general adult medical examination without abnormal findings: Secondary | ICD-10-CM | POA: Diagnosis not present

## 2019-10-02 DIAGNOSIS — G4733 Obstructive sleep apnea (adult) (pediatric): Secondary | ICD-10-CM | POA: Diagnosis not present

## 2019-10-02 DIAGNOSIS — E6609 Other obesity due to excess calories: Secondary | ICD-10-CM

## 2019-10-02 LAB — CBC WITH DIFFERENTIAL/PLATELET
Basophils Absolute: 0 10*3/uL (ref 0.0–0.2)
Basos: 1 %
EOS (ABSOLUTE): 0.2 10*3/uL (ref 0.0–0.4)
Eos: 4 %
Hematocrit: 40.6 % (ref 37.5–51.0)
Hemoglobin: 13.5 g/dL (ref 13.0–17.7)
Immature Grans (Abs): 0 10*3/uL (ref 0.0–0.1)
Immature Granulocytes: 0 %
Lymphocytes Absolute: 2.2 10*3/uL (ref 0.7–3.1)
Lymphs: 47 %
MCH: 28.2 pg (ref 26.6–33.0)
MCHC: 33.3 g/dL (ref 31.5–35.7)
MCV: 85 fL (ref 79–97)
Monocytes Absolute: 0.3 10*3/uL (ref 0.1–0.9)
Monocytes: 7 %
Neutrophils Absolute: 1.9 10*3/uL (ref 1.4–7.0)
Neutrophils: 41 %
Platelets: 278 10*3/uL (ref 150–450)
RBC: 4.79 x10E6/uL (ref 4.14–5.80)
RDW: 13.2 % (ref 11.6–15.4)
WBC: 4.6 10*3/uL (ref 3.4–10.8)

## 2019-10-02 LAB — LIPID PANEL
Chol/HDL Ratio: 4.2 ratio (ref 0.0–5.0)
Cholesterol, Total: 205 mg/dL — ABNORMAL HIGH (ref 100–199)
HDL: 49 mg/dL (ref >39)
LDL Chol Calc (NIH): 141 mg/dL — ABNORMAL HIGH (ref 0–99)
Triglycerides: 83 mg/dL (ref 0–149)
VLDL Cholesterol Cal: 15 mg/dL (ref 5–40)

## 2019-10-02 LAB — POCT URINALYSIS DIP (PROADVANTAGE DEVICE)
Bilirubin, UA: NEGATIVE
Blood, UA: NEGATIVE
Glucose, UA: NEGATIVE mg/dL
Ketones, POC UA: NEGATIVE mg/dL
Leukocytes, UA: NEGATIVE
Nitrite, UA: NEGATIVE
Protein Ur, POC: NEGATIVE mg/dL
Specific Gravity, Urine: 1.02
Urobilinogen, Ur: 0.2
pH, UA: 6 (ref 5.0–8.0)

## 2019-10-02 LAB — COMPREHENSIVE METABOLIC PANEL
ALT: 24 IU/L (ref 0–44)
AST: 21 IU/L (ref 0–40)
Albumin/Globulin Ratio: 1.6 (ref 1.2–2.2)
Albumin: 4.2 g/dL (ref 3.8–4.9)
Alkaline Phosphatase: 66 IU/L (ref 39–117)
BUN/Creatinine Ratio: 9 (ref 9–20)
BUN: 12 mg/dL (ref 6–24)
Bilirubin Total: <0.2 mg/dL (ref 0.0–1.2)
CO2: 22 mmol/L (ref 20–29)
Calcium: 9.1 mg/dL (ref 8.7–10.2)
Chloride: 102 mmol/L (ref 96–106)
Creatinine, Ser: 1.33 mg/dL — ABNORMAL HIGH (ref 0.76–1.27)
GFR calc Af Amer: 71 mL/min/{1.73_m2} (ref >59)
GFR calc non Af Amer: 61 mL/min/{1.73_m2} (ref >59)
Globulin, Total: 2.7 g/dL (ref 1.5–4.5)
Glucose: 112 mg/dL — ABNORMAL HIGH (ref 65–99)
Potassium: 4.1 mmol/L (ref 3.5–5.2)
Sodium: 139 mmol/L (ref 134–144)
Total Protein: 6.9 g/dL (ref 6.0–8.5)

## 2019-10-02 MED ORDER — PAROXETINE HCL 20 MG PO TABS: 20.0000 mg | ORAL_TABLET | Freq: Every morning | ORAL | 3 refills | Status: DC

## 2019-10-02 NOTE — Progress Notes (Signed)
   Subjective:    Patient ID: Corey [name], male    DOB: [birth year], 54 y.o.   MRN: [id number]  HPI He is here for complete examination. He continues have difficulty with left knee pain but rarely uses medication for that. He continues on Paxil to help with premature ejaculation. He has a long history of OSA and recently started back on CPAP and seems to be tolerating this fairly well. He does have a history of onychomycosis and is seeing podiatry for this. He was given Lamisil but apparently ran into some trouble with that. Record also shows elevated lipid panel. He states that he plans to get more physically active. He has no other concerns or complaints. Family and social history as well as health maintenance and immunizations was reviewed   Review of Systems  All other systems reviewed and are negative.      Objective:   Physical Exam Alert and in no distress. Tympanic membranes and canals are normal. Pharyngeal area is normal. Neck is supple without adenopathy or thyromegaly. Cardiac exam shows a regular sinus rhythm without murmurs or gallops. Lungs are clear to auscultation. Abdominal exam shows no masses or tenderness.      Assessment & Plan:  Routine general medical examination at a health care facility - Plan: Lipid panel, CBC with Differential/Platelet, Comprehensive metabolic panel, POCT Urinalysis DIP (Proadvantage Device)  Premature ejaculation - Plan: PARoxetine (PAXIL) 20 MG tablet  OSA on CPAP  Onychomycosis  Hyperlipidemia LDL goal <100 - Plan: Lipid panel  Class 1 obesity due to excess calories without serious comorbidity with body mass index (BMI) of 32.0 to 32.9 in adult  Arthritis  Elevated blood pressure reading Discussed the blood pressure with him. Since he is now on CPAP and plans to get more physically active, we will continue to watch this. He states that he did have a reading recently about 130/80. He will continue on Paxil. I then discussed the  treatment of arthritis and reviewed his x-rays with him. Since he seems to be tolerating the rather significant arthritic changes, no intervention is really needed.

## 2019-10-09 ENCOUNTER — Ambulatory Visit: Payer: Federal, State, Local not specified - PPO | Admitting: Podiatry

## 2019-10-09 ENCOUNTER — Encounter: Payer: Self-pay | Admitting: Podiatry

## 2019-10-09 ENCOUNTER — Other Ambulatory Visit: Payer: Self-pay

## 2019-10-09 DIAGNOSIS — Z79899 Other long term (current) drug therapy: Secondary | ICD-10-CM | POA: Diagnosis not present

## 2019-10-09 MED ORDER — ITRACONAZOLE 100 MG PO CAPS: 200.0000 mg | ORAL_CAPSULE | Freq: Every day | ORAL | 2 refills | Status: DC

## 2019-10-09 NOTE — Patient Instructions (Signed)
Itraconazole capsules and tablets °What is this medicine? °ITRACONAZOLE (i tra KO na zole) is an antifungal medicine. It is used to treat certain kinds of fungal or yeast infections. °This medicine may be used for other purposes; ask your health care provider or pharmacist if you have questions. °COMMON BRAND NAME(S): ONMEL, Sporanox, TOLSURA °What should I tell my health care provider before I take this medicine? °They need to know if you have any of these conditions: °· heart disease °· history of irregular heartbeat °· immune system problems °· kidney disease °· liver disease °· lung or breathing disease °· an unusual or allergic reaction to itraconazole, or other antifungal medicines, foods, dyes or preservatives °· pregnant or trying to get pregnant °· breast-feeding °How should I use this medicine? °Take this medicine by mouth with a full glass of water. Follow the directions on the prescription label. Take this medicine with food. Avoid taking antacids, H2-blockers, or proton pump inhibitors within 2 hours of taking this medicine. It is best to separate these medicines by 2 hours. Take your medicine at regular intervals. Do not take your medicine more often than directed. Take all of your medicine as directed even if you think you are better. Do not skip doses or stop your medicine early. °Talk to your pediatrician regarding the use of this medicine in children. Special care may be needed. °Overdosage: If you think you have taken too much of this medicine contact a poison control center or emergency room at once. °NOTE: This medicine is only for you. Do not share this medicine with others. °What if I miss a dose? °If you miss a dose, take it as soon as you can. If it is almost time for your next dose, take only that dose. Do not take double or extra doses. °What may interact with this medicine? °Do not take this medicine with any of the following medications: °· alfuzosin °· alprazolam °· avanafil °· certain  medicines for blood pressure like felodipine, nisoldipine °· certain medicines for cholesterol like cerivastatin, lovastatin, simvastatin, lomitapide °· certain medicines for the heart like disopyramide, dofetilide, dronedarone, eplerenone, ivabradine, quinidine, ranolazine °· cisapride °· colchicine (if you have liver or kidney problems) °· conivaptan °· ergot alkaloids like dihydroergotamine, ergonovine, ergotamine, methylergonovine °· irinotecan °· isavuconazole °· lurasidone °· methadone °· midazolam °· naloxegol °· nevirapine °· other medicines that prolong the QT interval (cause an abnormal heart rhythm) °· pimozide °· red yeast rice °· sirolimus °· thioridazine °· ticagrelor °· triazolam °This medicine may also interact with the following medications: °· aliskiren °· amlodipine °· antacids °· antiviral medicines for HIV or AIDS °· aprepitant °· atorvastatin °· buprenorphine °· certain antibiotics like ciprofloxacin, clarithromycin, erythromycin °· certain medicines for bladder problems like fesoterodine, solifenacin, tolterodine °· certain medicines for cancer like axitinib, bortezomib, busulfan, dabrafenib, dasatinib, docetaxel, erlotinib, gefitinib, ibrutinib, imatinib, ixabepilone, lapatinib, nilotinib, ponatinib, sunitinib, trabectedin, trimetrexate, vinca alkaloids °· certain medicines for depression, anxiety, or psychotic disturbances like aripiprazole, buspirone, diazepam, haloperidol, perospirone, quetiapine, risperidone °· certain medicines for erectile dysfunction like vardenafil, sildenafil, tadalafil °· certain medicines for pain like alfentanil, fentanyl, oxycodone, sufentanil °· certain medicines for stomach problems like cimetidine, famotidine, omeprazole, lansoprazole °· certain medicines that treat or prevent blood clots like warfarin, dabigatran, rivaroxaban °· certain medicines for seizures like carbamazepine, phenytoin °· certain medicines for tuberculosis like isoniazid, INH, rifabutin,  rifampin, rifapentine °· cilostazol °· cinacalcet °· cyclosporine °· digoxin °· eletriptan °· everolimus °· halofantrine °· isradipine °· meloxicam °· nadolol °·   nifedipine °· other medicines for fungal infections °· praziquantel °· ramelteon °· repaglinide °· salmeterol °· saxagliptin °· steroid medicines like budesonide, ciclesonide, dexamethasone, fluticasone, methylprednisolone °· tacrolimus °· tamsulosin °· tolvaptan °· verapamil °· ziprasidone °This list may not describe all possible interactions. Give your health care provider a list of all the medicines, herbs, non-prescription drugs, or dietary supplements you use. Also tell them if you smoke, drink alcohol, or use illegal drugs. Some items may interact with your medicine. °What should I watch for while using this medicine? °Tell your doctor or healthcare professional if your symptoms do not start to get better or if they get worse. °You may get drowsy or dizzy. Do not drive, use machinery, or do anything that needs mental alertness until you know how the medicine affects you. Do not stand or sit up quickly, especially if you are an older patient. This reduces the risk of dizzy or fainting spells. °What side effects may I notice from receiving this medicine? °Side effects that you should report to your doctor or health care professional as soon as possible: °· allergic reactions like skin rash, itching or hives, swelling of the face, lips, or tongue °· changes in hearing °· pain, tingling, numbness in the hands or feet °· signs and symptoms of heart failure like breathing problems; fast, irregular heartbeat; sudden weight gain; swelling of the ankles, feet; unusually weak or tired °· signs and symptoms of liver injury like dark yellow or brown urine; general ill feeling or flu-like symptoms; light-colored stools; loss of appetite; right upper belly pain; yellowing of the eyes or skin °Side effects that usually do not require medical attention (report to  your doctor or health care professional if they continue or are bothersome): °· blurred vision °· diarrhea °· dizziness °· headache °· nausea, vomiting °This list may not describe all possible side effects. Call your doctor for medical advice about side effects. You may report side effects to FDA at 1-800-FDA-1088. °Where should I keep my medicine? °Keep out of the reach of children. °Store at room temperature between 15 and 25 degrees C (59 and 77 degrees F). Protect from light and moisture. Throw away any unused medicine after the expiration date. °NOTE: This sheet is a summary. It may not cover all possible information. If you have questions about this medicine, talk to your doctor, pharmacist, or health care provider. °© 2020 [employer] (2018-05-09 13:38:38) ° ° °

## 2019-10-10 NOTE — Progress Notes (Signed)
Subjective: 54 year old male presents the office for violation of nail fungus.  We stopped the Lamisil previously due to slight increase in liver enzymes.  These are returned to normal.  Overall the nails are getting somewhat better but they are still discolored but the color has improved. Denies any systemic complaints such as fevers, chills, nausea, vomiting. No acute changes since last appointment, and no other complaints at this time.   Last a1c 5.9 Objective: AAO x3, NAD DP/PT pulses palpable bilaterally, CRT less than 3 seconds There does appear to be clearing along the proximal nail folds of the hallux toenails but the nails are hypertrophic with yellow discoloration.  Distal dark discoloration majority the nails but overall the color does appear to be improved to all the nails.  There is no pain, redness or drainage or signs of infection. No edema, erythema, increase in warmth to bilateral lower extremities.  No open lesions or pre-ulcerative lesions.  No pain with calf compression, swelling, warmth, erythema  Assessment: Onychomycosis  Plan: -All treatment options discussed with the patient including all alternatives, risks, complications.  -Repeat blood work reveals normal liver enzymes.  His tentative switch to terconazole I discussed side effects with medication.  We will recheck a CBC, CMP in 1 month.  Lab requisitions given to him today.  Monitoring side effects and stop the medication should any occur. -Patient encouraged to call the office with any questions, concerns, change in symptoms.   Return in about 3 months (around 01/09/2020).  Trula Slade DPM

## 2019-10-11 DIAGNOSIS — G4733 Obstructive sleep apnea (adult) (pediatric): Secondary | ICD-10-CM | POA: Diagnosis not present

## 2019-10-12 DIAGNOSIS — G4733 Obstructive sleep apnea (adult) (pediatric): Secondary | ICD-10-CM | POA: Diagnosis not present

## 2019-10-18 LAB — SPECIMEN STATUS REPORT

## 2019-10-18 LAB — HGB A1C W/O EAG: Hgb A1c MFr Bld: 5.9 % — ABNORMAL HIGH (ref 4.8–5.6)

## 2019-11-04 ENCOUNTER — Other Ambulatory Visit: Payer: Self-pay | Admitting: Podiatry

## 2019-11-11 DIAGNOSIS — G4733 Obstructive sleep apnea (adult) (pediatric): Secondary | ICD-10-CM | POA: Diagnosis not present

## 2019-12-11 DIAGNOSIS — G4733 Obstructive sleep apnea (adult) (pediatric): Secondary | ICD-10-CM | POA: Diagnosis not present

## 2020-01-11 DIAGNOSIS — G4733 Obstructive sleep apnea (adult) (pediatric): Secondary | ICD-10-CM | POA: Diagnosis not present

## 2020-01-15 ENCOUNTER — Encounter: Payer: Self-pay | Admitting: Podiatry

## 2020-01-15 ENCOUNTER — Other Ambulatory Visit: Payer: Self-pay

## 2020-01-15 ENCOUNTER — Ambulatory Visit: Payer: Federal, State, Local not specified - PPO | Admitting: Podiatry

## 2020-01-15 DIAGNOSIS — B351 Tinea unguium: Secondary | ICD-10-CM | POA: Diagnosis not present

## 2020-01-15 NOTE — Progress Notes (Signed)
Subjective: 54 year old male presents the office today for Evaluation of nail fungus.  Since I last saw him he was not able to get the itraconazole due to insurance issues and therefore did not get repeat blood work.  He has been getting routine pedicures as well as using tea tree oil which she states been helpful.  Denies any pain in the nails denies any redness or drainage Denies any systemic complaints such as fevers, chills, nausea, vomiting. No acute changes since last appointment, and no other complaints at this time.   Objective: AAO x3, NAD DP/PT pulses palpable bilaterally, CRT less than 3 seconds Overall nails are much improved.  Nails are mildly hypertrophic and dystrophic with brown discoloration.  There is some hyperpigmentation to the majority of the toenails there is no extension into any surrounding skin.  Overall the color is much improved compared to what it has been previously.  No pain with calf compression, swelling, warmth, erythema  Assessment: Onychomycosis, improving   Plan: -All treatment options discussed with the patient including all alternatives, risks, complications.  -Overall doing better.  He wants to hold off any oral medications at this point.  Continue pedicures and we also discussed a biotin supplement for the nails.  Continue topical treatments. -Patient encouraged to call the office with any questions, concerns, change in symptoms.   Trula Slade DPM

## 2020-02-11 DIAGNOSIS — G4733 Obstructive sleep apnea (adult) (pediatric): Secondary | ICD-10-CM | POA: Diagnosis not present

## 2020-03-05 DIAGNOSIS — T6591XA Toxic effect of unspecified substance, accidental (unintentional), initial encounter: Secondary | ICD-10-CM | POA: Diagnosis not present

## 2020-03-12 DIAGNOSIS — G4733 Obstructive sleep apnea (adult) (pediatric): Secondary | ICD-10-CM | POA: Diagnosis not present

## 2020-04-04 DIAGNOSIS — G4733 Obstructive sleep apnea (adult) (pediatric): Secondary | ICD-10-CM | POA: Diagnosis not present

## 2020-07-22 ENCOUNTER — Ambulatory Visit (AMBULATORY_SURGERY_CENTER): Payer: Federal, State, Local not specified - PPO | Admitting: *Deleted

## 2020-07-22 ENCOUNTER — Other Ambulatory Visit: Payer: Self-pay

## 2020-07-22 VITALS — Ht 69.0 in | Wt 225.0 lb

## 2020-07-22 DIAGNOSIS — Z86010 Personal history of colonic polyps: Secondary | ICD-10-CM

## 2020-07-22 DIAGNOSIS — K5190 Ulcerative colitis, unspecified, without complications: Secondary | ICD-10-CM

## 2020-07-22 MED ORDER — SUPREP BOWEL PREP KIT 17.5-3.13-1.6 GM/177ML PO SOLN: 1.0000 | Freq: Once | ORAL | 0 refills | Status: AC

## 2020-07-22 NOTE — Progress Notes (Signed)
No egg or soy allergy known to patient  No issues with past sedation with any surgeries or procedures No intubation problems in the past  No FH of Malignant Hyperthermia No diet pills per patient No home 02 use per patient  No blood thinners per patient  Pt denies issues with constipation  No A fib or A flutter  EMMI video to pt or via [place] 19 guidelines implemented in PV today with Pt and RN  Pt is fully vaccinated  for [employer] given to pt in PV today , Code to Pharmacy and  NO PA's for preps discussed with pt In PV today  Discussed with pt there will be an out-of-pocket cost for prep and that varies from $0 to 70 dollars   Due to the COVID-19 pandemic we are asking patients to follow certain guidelines.  Pt aware of COVID protocols and [facility] guidelines   Pt verified name, DOB, address and insurance during PV today. Pt mailed instruction packet to included paper to complete and mail back to [facility] with addressed and stamped envelope, Emmi video, copy of consent form to read and not return, and instructions. Suprep coupon mailed in packet. PV completed over the phone. Pt encouraged to call with questions or issues  My chart instructions to pt as well

## 2020-08-05 ENCOUNTER — Encounter: Payer: Self-pay | Admitting: Internal Medicine

## 2020-08-05 ENCOUNTER — Other Ambulatory Visit: Payer: Self-pay

## 2020-08-05 ENCOUNTER — Ambulatory Visit (AMBULATORY_SURGERY_CENTER): Payer: Federal, State, Local not specified - PPO | Admitting: Internal Medicine

## 2020-08-05 VITALS — BP 121/77 | HR 75 | Temp 97.1°F | Resp 12 | Ht 69.0 in | Wt 225.0 lb

## 2020-08-05 DIAGNOSIS — D120 Benign neoplasm of cecum: Secondary | ICD-10-CM

## 2020-08-05 DIAGNOSIS — Z86010 Personal history of colonic polyps: Secondary | ICD-10-CM

## 2020-08-05 DIAGNOSIS — K635 Polyp of colon: Secondary | ICD-10-CM | POA: Diagnosis not present

## 2020-08-05 DIAGNOSIS — Z1211 Encounter for screening for malignant neoplasm of colon: Secondary | ICD-10-CM | POA: Diagnosis not present

## 2020-08-05 MED ORDER — SODIUM CHLORIDE 0.9 % IV SOLN: 500.0000 mL | Freq: Once | INTRAVENOUS | Status: DC

## 2020-08-05 NOTE — Progress Notes (Signed)
VS-SM  Pt's states no medical or surgical changes since previsit or office visit.  

## 2020-08-05 NOTE — Patient Instructions (Signed)
Handouts given:  Polyps, diverticulosis Resume previous diet Continue current medications Await pathology results YOU HAD AN ENDOSCOPIC PROCEDURE TODAY AT [place]:   Refer to the procedure report that was given to you for any specific questions about what was found during the examination.  If the procedure report does not answer your questions, please call your gastroenterologist to clarify.  If you requested that your care partner not be given the details of your procedure findings, then the procedure report has been included in a sealed envelope for you to review at your convenience later.  YOU SHOULD EXPECT: Some feelings of bloating in the abdomen. Passage of more gas than usual.  Walking can help get rid of the air that was put into your GI tract during the procedure and reduce the bloating. If you had a lower endoscopy (such as a colonoscopy or flexible sigmoidoscopy) you may notice spotting of blood in your stool or on the toilet paper. If you underwent a bowel prep for your procedure, you may not have a normal bowel movement for a few days.  Please Note:  You might notice some irritation and congestion in your nose or some drainage.  This is from the oxygen used during your procedure.  There is no need for concern and it should clear up in a day or so.  SYMPTOMS TO REPORT IMMEDIATELY:   Following lower endoscopy (colonoscopy or flexible sigmoidoscopy):  Excessive amounts of blood in the stool  Significant tenderness or worsening of abdominal pains  Swelling of the abdomen that is new, acute  Fever of 100F or higher  For urgent or emergent issues, a gastroenterologist can be reached at any hour by calling [phone]. Do not use MyChart messaging for urgent concerns.   DIET:  We do recommend a small meal at first, but then you may proceed to your regular diet.  Drink plenty of fluids but you should avoid alcoholic beverages for 24 hours.  ACTIVITY:  You should  plan to take it easy for the rest of today and you should NOT DRIVE or use heavy machinery until tomorrow (because of the sedation medicines used during the test).    FOLLOW UP: Our staff will call the number listed on your records 48-72 hours following your procedure to check on you and address any questions or concerns that you may have regarding the information given to you following your procedure. If we do not reach you, we will leave a message.  We will attempt to reach you two times.  During this call, we will ask if you have developed any symptoms of COVID 19. If you develop any symptoms (ie: fever, flu-like symptoms, shortness of breath, cough etc.) before then, please call [phone].  If you test positive for Covid 19 in the 2 weeks post procedure, please call and report this information to [place].    If any biopsies were taken you will be contacted by phone or by letter within the next 1-3 weeks.  Please call us at [phone] if you have not heard about the biopsies in 3 weeks.   SIGNATURES/CONFIDENTIALITY: You and/or your care partner have signed paperwork which will be entered into your electronic medical record.  These signatures attest to the fact that that the information above on your After Visit Summary has been reviewed and is understood.  Full responsibility of the confidentiality of this discharge information lies with you and/or your care-partner.

## 2020-08-05 NOTE — Op Note (Signed)
[facility] Patient Name: Corey [name] Procedure Date: 08/05/2020 1:57 PM MRN: [id number] Endoscopist: Jerene Bears , MD Age: 55 Referring MD:  Date of Birth: [birth year] Gender: Male Account #: [id number] Procedure:                Colonoscopy Indications:              High risk colon cancer surveillance: Personal                            history of colonic polyps, Last colonoscopy: August                            2016 Medicines:                Monitored Anesthesia Care Procedure:                Pre-Anesthesia Assessment:                           - Prior to the procedure, a History and Physical                            was performed, and patient medications and                            allergies were reviewed. The patient's tolerance of                            previous anesthesia was also reviewed. The risks                            and benefits of the procedure and the sedation                            options and risks were discussed with the patient.                            All questions were answered, and informed consent                            was obtained. Prior Anticoagulants: The patient has                            taken no previous anticoagulant or antiplatelet                            agents. ASA Grade Assessment: II - A patient with                            mild systemic disease. After reviewing the risks                            and benefits, the patient was deemed in  satisfactory condition to undergo the procedure.                           After obtaining informed consent, the colonoscope                            was passed under direct vision. Throughout the                            procedure, the patient's blood pressure, pulse, and                            oxygen saturations were monitored continuously. The                            Olympus CF-HQ190 [id number]) Colonoscope was                             introduced through the anus and advanced to the                            cecum, identified by the appendiceal orifice,                            ileocecal valve and palpation. The colonoscopy was                            performed without difficulty. The patient tolerated                            the procedure well. The quality of the bowel                            preparation was good. The ileocecal valve,                            appendiceal orifice, and rectum were photographed. Scope In: 2:11:57 PM Scope Out: 2:25:37 PM Scope Withdrawal Time: 0 hours 11 minutes 1 second  Total Procedure Duration: 0 hours 13 minutes 40 seconds  Findings:                 The digital rectal exam was normal.                           A 4 mm polyp was found in the cecum. The polyp was                            sessile. The polyp was removed with a cold biopsy                            forceps. Resection and retrieval were complete.                           A few small-mouthed diverticula were found in the  ascending colon.                           The exam was otherwise without abnormality on                            direct and retroflexion views. Complications:            No immediate complications. Estimated Blood Loss:     Estimated blood loss was minimal. Impression:               - One 4 mm polyp in the cecum, removed with a cold                            biopsy forceps. Resected and retrieved.                           - Diverticulosis in the ascending colon.                           - The examination was otherwise normal on direct                            and retroflexion views. Recommendation:           - Patient has a contact number available for                            emergencies. The signs and symptoms of potential                            delayed complications were discussed with the                            patient. Return to  normal activities tomorrow.                            Written discharge instructions were provided to the                            patient.                           - Resume previous diet.                           - Continue present medications.                           - Await pathology results.                           - Repeat colonoscopy is recommended. The                            colonoscopy date will be determined after pathology  results from today's exam become available for                            review. Jerene Bears, MD 08/05/2020 2:39:31 PM This report has been signed electronically.

## 2020-08-05 NOTE — Progress Notes (Signed)
Called to room to assist during endoscopic procedure.  Patient ID and intended procedure confirmed with present staff. Received instructions for my participation in the procedure from the performing physician.  

## 2020-08-07 ENCOUNTER — Telehealth: Payer: Self-pay | Admitting: *Deleted

## 2020-08-07 NOTE — Telephone Encounter (Signed)
  Follow up Call-  Call back number 08/05/2020  Post procedure Call Back phone  # [phone]  Permission to leave phone message Yes  Some recent data might be hidden     Patient questions:  Do you have a fever, pain , or abdominal swelling? No. Pain Score  0 *  Have you tolerated food without any problems? Yes.    Have you been able to return to your normal activities? Yes.    Do you have any questions about your discharge instructions: Diet   No. Medications  No. Follow up visit  No.  Do you have questions or concerns about your Care? No.  Actions: * If pain score is 4 or above: No action needed, pain <4.

## 2020-08-20 ENCOUNTER — Encounter: Payer: Self-pay | Admitting: Internal Medicine

## 2020-09-15 ENCOUNTER — Other Ambulatory Visit: Payer: Self-pay | Admitting: Family Medicine

## 2020-09-15 DIAGNOSIS — F524 Premature ejaculation: Secondary | ICD-10-CM

## 2020-09-16 NOTE — Telephone Encounter (Signed)
It is time for a follow-up appointment.

## 2020-09-16 NOTE — Telephone Encounter (Signed)
Is this okay to refill? 

## 2020-10-01 DIAGNOSIS — G4733 Obstructive sleep apnea (adult) (pediatric): Secondary | ICD-10-CM | POA: Diagnosis not present

## 2020-10-07 ENCOUNTER — Ambulatory Visit: Payer: Federal, State, Local not specified - PPO | Admitting: Family Medicine

## 2020-10-07 ENCOUNTER — Other Ambulatory Visit: Payer: Self-pay

## 2020-10-07 ENCOUNTER — Encounter: Payer: Self-pay | Admitting: Family Medicine

## 2020-10-07 VITALS — BP 128/82 | HR 92 | Temp 97.2°F | Ht 67.75 in | Wt 233.2 lb

## 2020-10-07 DIAGNOSIS — E785 Hyperlipidemia, unspecified: Secondary | ICD-10-CM

## 2020-10-07 DIAGNOSIS — G4733 Obstructive sleep apnea (adult) (pediatric): Secondary | ICD-10-CM

## 2020-10-07 DIAGNOSIS — Z6832 Body mass index (BMI) 32.0-32.9, adult: Secondary | ICD-10-CM

## 2020-10-07 DIAGNOSIS — Z23 Encounter for immunization: Secondary | ICD-10-CM | POA: Diagnosis not present

## 2020-10-07 DIAGNOSIS — Z0000 Encounter for general adult medical examination without abnormal findings: Secondary | ICD-10-CM | POA: Diagnosis not present

## 2020-10-07 DIAGNOSIS — E6609 Other obesity due to excess calories: Secondary | ICD-10-CM | POA: Diagnosis not present

## 2020-10-07 DIAGNOSIS — Z9989 Dependence on other enabling machines and devices: Secondary | ICD-10-CM

## 2020-10-07 DIAGNOSIS — F524 Premature ejaculation: Secondary | ICD-10-CM

## 2020-10-07 DIAGNOSIS — M1990 Unspecified osteoarthritis, unspecified site: Secondary | ICD-10-CM | POA: Diagnosis not present

## 2020-10-07 MED ORDER — PAROXETINE HCL 20 MG PO TABS: ORAL_TABLET | ORAL | 3 refills | Status: DC

## 2020-10-07 NOTE — Progress Notes (Signed)
   Subjective:    Patient ID: Corey [name], male    DOB: 1965/09/28, 55 y.o.   MRN: [id number]  HPI He is here for complete examination.  He has 2 main concerns.  He has left knee arthritis and continued difficulty with this.  He is now ready to have a replacement.  He has seen Dr. Lorin Mercy in the past.  He also has a lesion on his left elbow that when he leans on it it causes some difficulty.  He continues on paroxetine to help with premature ejaculation.  He does have OSA and continues to do quite nicely on CPAP and getting good readout on it.  He rarely uses a sleep med.  Review of medical record indicates the need for Tdap.  He has had a colonoscopy which was negative.  His work and home life are going well.  Family and social history as well as health maintenance immunization was otherwise reviewed.   Review of Systems  All other systems reviewed and are negative.      Objective:   Physical Exam Alert and in no distress. Tympanic membranes and canals are normal. Pharyngeal area is normal. Neck is supple without adenopathy or thyromegaly. Cardiac exam shows a regular sinus rhythm without murmurs or gallops. Lungs are clear to auscultation. Exam of the left elbow does show a 1 cm tender lesion that is in the skin.      Assessment & Plan:  Routine general medical examination at a health care facility - Plan: CBC with Differential/Platelet, Comprehensive metabolic panel, Lipid panel  Hyperlipidemia LDL goal <100 - Plan: Lipid panel  OSA on CPAP  Class 1 obesity due to excess calories without serious comorbidity with body mass index (BMI) of 32.0 to 32.9 in adult - Plan: CBC with Differential/Platelet, Comprehensive metabolic panel, Lipid panel  Arthritis - Plan: Ambulatory referral to Orthopedic Surgery  Premature ejaculation - Plan: PARoxetine (PAXIL) 20 MG tablet  Need for Tdap vaccination - Plan: Tdap vaccine greater than or equal to 7yo IM He will continue on his present  medication regimen.  Continue on CPAP.  Discussed weight loss with him in regard to potentially getting off the CPAP and also benefiting general health and wellbeing.  Recommend cutting back on carbohydrates.

## 2020-10-08 LAB — LIPID PANEL
Chol/HDL Ratio: 5.4 ratio — ABNORMAL HIGH (ref 0.0–5.0)
Cholesterol, Total: 284 mg/dL — ABNORMAL HIGH (ref 100–199)
HDL: 53 mg/dL (ref >39)
LDL Chol Calc (NIH): 205 mg/dL — ABNORMAL HIGH (ref 0–99)
Triglycerides: 142 mg/dL (ref 0–149)
VLDL Cholesterol Cal: 26 mg/dL (ref 5–40)

## 2020-10-08 LAB — COMPREHENSIVE METABOLIC PANEL
ALT: 28 IU/L (ref 0–44)
AST: 20 IU/L (ref 0–40)
Albumin/Globulin Ratio: 1.6 (ref 1.2–2.2)
Albumin: 4.6 g/dL (ref 3.8–4.9)
Alkaline Phosphatase: 79 IU/L (ref 44–121)
BUN/Creatinine Ratio: 8 — ABNORMAL LOW (ref 9–20)
BUN: 11 mg/dL (ref 6–24)
Bilirubin Total: 0.2 mg/dL (ref 0.0–1.2)
CO2: 24 mmol/L (ref 20–29)
Calcium: 9.5 mg/dL (ref 8.7–10.2)
Chloride: 102 mmol/L (ref 96–106)
Creatinine, Ser: 1.36 mg/dL — ABNORMAL HIGH (ref 0.76–1.27)
Globulin, Total: 2.8 g/dL (ref 1.5–4.5)
Glucose: 121 mg/dL — ABNORMAL HIGH (ref 65–99)
Potassium: 4.8 mmol/L (ref 3.5–5.2)
Sodium: 142 mmol/L (ref 134–144)
Total Protein: 7.4 g/dL (ref 6.0–8.5)
eGFR: 63 mL/min/{1.73_m2} (ref >59)

## 2020-10-08 LAB — CBC WITH DIFFERENTIAL/PLATELET
Basophils Absolute: 0 10*3/uL (ref 0.0–0.2)
Basos: 1 %
EOS (ABSOLUTE): 0.2 10*3/uL (ref 0.0–0.4)
Eos: 3 %
Hematocrit: 43.3 % (ref 37.5–51.0)
Hemoglobin: 14.4 g/dL (ref 13.0–17.7)
Immature Grans (Abs): 0 10*3/uL (ref 0.0–0.1)
Immature Granulocytes: 0 %
Lymphocytes Absolute: 2.3 10*3/uL (ref 0.7–3.1)
Lymphs: 42 %
MCH: 27.9 pg (ref 26.6–33.0)
MCHC: 33.3 g/dL (ref 31.5–35.7)
MCV: 84 fL (ref 79–97)
Monocytes Absolute: 0.4 10*3/uL (ref 0.1–0.9)
Monocytes: 7 %
Neutrophils Absolute: 2.6 10*3/uL (ref 1.4–7.0)
Neutrophils: 47 %
Platelets: 258 10*3/uL (ref 150–450)
RBC: 5.16 x10E6/uL (ref 4.14–5.80)
RDW: 13.1 % (ref 11.6–15.4)
WBC: 5.5 10*3/uL (ref 3.4–10.8)

## 2020-10-09 LAB — SPECIMEN STATUS REPORT

## 2020-10-09 LAB — HGB A1C W/O EAG: Hgb A1c MFr Bld: 6.2 % — ABNORMAL HIGH (ref 4.8–5.6)

## 2020-10-16 ENCOUNTER — Ambulatory Visit: Payer: Self-pay

## 2020-10-16 ENCOUNTER — Ambulatory Visit: Payer: Federal, State, Local not specified - PPO | Admitting: Orthopaedic Surgery

## 2020-10-16 ENCOUNTER — Other Ambulatory Visit: Payer: Self-pay

## 2020-10-16 ENCOUNTER — Encounter: Payer: Self-pay | Admitting: Orthopaedic Surgery

## 2020-10-16 VITALS — BP 133/89 | HR 85 | Ht 68.0 in | Wt 223.0 lb

## 2020-10-16 DIAGNOSIS — M1712 Unilateral primary osteoarthritis, left knee: Secondary | ICD-10-CM

## 2020-10-16 DIAGNOSIS — M25562 Pain in left knee: Secondary | ICD-10-CM | POA: Diagnosis not present

## 2020-10-16 DIAGNOSIS — G8929 Other chronic pain: Secondary | ICD-10-CM

## 2020-10-16 NOTE — Progress Notes (Signed)
Office Visit Note   Patient: Corey [name]           Date of Birth: [birth year]           MRN: [id number] Visit Date: 10/16/2020              Requested by: Denita Lung, MD [place],  [place] [address] PCP: Denita Lung, MD   Assessment & Plan: Visit Diagnoses:  1. Chronic pain of left knee   2. Unilateral primary osteoarthritis, left knee     Plan: Patient's had previous anti-inflammatories topical Voltaren gel rest activity modification, intra-articular cortisone injections without relief of his progressive left knee osteoarthritis with bone-on-bone changes.  He has some subluxation medially of the femur on the tibia with varus less than 10 degrees.  He like to proceed with total knee arthroplasty.  We discussed overnight stay, home health therapy, outpatient therapy, pain, spinal anesthesia, knee block, Exparel, Marcaine.  Questions were elicited and answered he understands and request to proceed.  Follow-Up Instructions: No follow-ups on file.   Orders:  Orders Placed This Encounter  Procedures  . XR KNEE 3 VIEW LEFT   No orders of the defined types were placed in this encounter.     Procedures: No procedures performed   Clinical Data: No additional findings.   Subjective: Chief Complaint  Patient presents with  . 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  Previous x-rays 10/31/2018 showed bone-on-bone changes medial compartment.  He has been using CBD ointment, ibuprofen regularly and therapeutic dosages and also Voltaren gel.  His knee has been grabbing catching swelling and giving out.  He states he is ready for knee arthroplasty.  He has no diabetes no hypertension.  Pain bothers him with community ambulation keeps him awake at night.  Review of Systems positive for obesity BMI 33, hyperlipidemia and previous knee injuries left hand when he was younger.   Objective: Vital  Signs: BP 133/89   Pulse 85   Ht 5\' 8"  (1.727 m)   Wt 223 lb (101.2 kg)   BMI 33.91 kg/m   Physical Exam Constitutional:      Appearance: He is well-developed.  HENT:     Head: Normocephalic and atraumatic.  Eyes:     Pupils: Pupils are equal, round, and reactive to light.  Neck:     Thyroid: No thyromegaly.     Trachea: No tracheal deviation.  Cardiovascular:     Rate and Rhythm: Normal rate.  Pulmonary:     Effort: Pulmonary effort is normal.     Breath sounds: No wheezing.  Abdominal:     General: Bowel sounds are normal.     Palpations: Abdomen is soft.  Skin:    General: Skin is warm and dry.     Capillary Refill: Capillary refill takes less than 2 seconds.  Neurological:     Mental Status: He is alert and oriented to person, place, and time.  Psychiatric:        Behavior: Behavior normal.        Thought Content: Thought content normal.        Judgment: Judgment normal.     Ortho Exam patient has 10 degrees lack full extension left knee flexion 100 degrees.  Mild varus deformity in standing position.  Palpable medial osteophytes crepitus 3+ knee effusion.  Distal pulses are palpable.  Negative logroll of the  hips.  Anterior tib EHL is intact no sciatic notch tenderness.  Specialty Comments:  No specialty comments available.  Imaging: Standing AP both knees lateral left knee sunrise patella x-rays demonstrates severe left knee tricompartmental arthritis with 8 degrees varus medial compartment bone-on-bone changes.  Mild changes right knee spurring and some joint narrowing.  Impression: Moderate to severe left knee osteoarthritis, mild right knee arthritis.   PMFS History: Patient Active Problem List   Diagnosis Date Noted  . Unilateral primary osteoarthritis, left knee 10/17/2020  . Arthritis 09/26/2018  . Onychomycosis 09/26/2018  . Class 1 obesity due to excess calories without serious comorbidity with body mass index (BMI) of 32.0 to 32.9 in adult  09/06/2017  . Hyperlipidemia LDL goal <100 08/31/2016  . OSA on CPAP 08/31/2016  . Premature ejaculation 08/19/2011   Past Medical History:  Diagnosis Date  . ED (erectile dysfunction)   . Hyperlipidemia   . Hypertension   . Sleep apnea    CPAP - not using lately due to recall     Family History  Problem Relation Age of Onset  . Alzheimer's disease Mother   . Stevens-Johnson syndrome Father   . Autism Brother   . Colon cancer Other   . Colon polyps Neg Hx   . Esophageal cancer Neg Hx   . Rectal cancer Neg Hx   . Stomach cancer Neg Hx     Past Surgical History:  Procedure Laterality Date  . ANKLE BONE SURGERY    . BACK SURGERY    . COLLAR BONE SURGEY    . COLONOSCOPY    . HERNIA REPAIR    . KNEE ARTHROSCOPY Left 2006  . LUMBAR MICRODISCECTOMY    . POLYPECTOMY    . UMBILICAL HERNIA REPAIR     Social History   Occupational History  . Occupation: Engineer, agricultural  Tobacco Use  . Smoking status: Never Smoker  . Smokeless tobacco: Never Used  Vaping Use  . Vaping Use: Never used  Substance and Sexual Activity  . Alcohol use: Yes    Alcohol/week: 3.0 standard drinks    Types: 3 Glasses of wine per week  . Drug use: Not Currently    Types: Marijuana  . Sexual activity: Yes

## 2020-10-17 DIAGNOSIS — M1712 Unilateral primary osteoarthritis, left knee: Secondary | ICD-10-CM | POA: Insufficient documentation

## 2020-10-18 ENCOUNTER — Other Ambulatory Visit: Payer: Self-pay

## 2020-10-21 ENCOUNTER — Ambulatory Visit: Payer: Federal, State, Local not specified - PPO | Admitting: Family Medicine

## 2020-10-21 ENCOUNTER — Other Ambulatory Visit: Payer: Self-pay | Admitting: Family Medicine

## 2020-10-21 ENCOUNTER — Encounter: Payer: Self-pay | Admitting: Family Medicine

## 2020-10-21 ENCOUNTER — Other Ambulatory Visit: Payer: Self-pay

## 2020-10-21 VITALS — BP 144/84 | HR 104 | Temp 97.0°F | Wt 231.8 lb

## 2020-10-21 DIAGNOSIS — D2112 Benign neoplasm of connective and other soft tissue of left upper limb, including shoulder: Secondary | ICD-10-CM | POA: Diagnosis not present

## 2020-10-21 DIAGNOSIS — M799 Soft tissue disorder, unspecified: Secondary | ICD-10-CM

## 2020-10-21 MED ORDER — LIDOCAINE-EPINEPHRINE 2 %-1:100000 IJ SOLN: 1.7000 mL | Freq: Once | INTRAMUSCULAR | Status: DC

## 2020-10-21 NOTE — Progress Notes (Signed)
   Subjective:    Patient ID: Corey [name], male    DOB: 04-08-1966, 55 y.o.   MRN: [id number]  HPI He is here for removal of a lesion on his left elbow.  He has been there for several years and is now causing more pain every time he leans on his elbow.   Review of Systems     Objective:   Physical Exam A 1 cm round smooth painful lesion is noted on the left elbow.       Assessment & Plan:  Soft tissue lesion of elbow region The lesion was injected with Xylocaine and epinephrine.  A vertical incision was made of approximately centimeter and a half.  The lesion was identified and extruded without difficulty.  The lesion was approximately half a centimeter in size round smooth and whitish in nature.  It was sent for pathology.  The lesion was closed with a Steri-Strip.

## 2020-10-30 ENCOUNTER — Telehealth: Payer: Self-pay | Admitting: Surgery

## 2020-10-30 NOTE — Telephone Encounter (Signed)
Received $25.00 cash,medical records release form and disability paperwork from patient/Forwarding to [employer] today  

## 2020-10-31 ENCOUNTER — Ambulatory Visit: Payer: Federal, State, Local not specified - PPO | Admitting: Surgery

## 2020-10-31 ENCOUNTER — Encounter: Payer: Self-pay | Admitting: Surgery

## 2020-10-31 VITALS — BP 149/94 | HR 94

## 2020-10-31 DIAGNOSIS — M1712 Unilateral primary osteoarthritis, left knee: Secondary | ICD-10-CM

## 2020-10-31 DIAGNOSIS — M25562 Pain in left knee: Secondary | ICD-10-CM

## 2020-10-31 DIAGNOSIS — G8929 Other chronic pain: Secondary | ICD-10-CM

## 2020-10-31 NOTE — Progress Notes (Signed)
55 year old black male with history of end-stage DJD left knee and pain comes in for preop evaluation.  States that knee symptoms unchanged from previous visit.  He is wanting to proceed with left total knee replacement as scheduled.  Today history and physical performed.  Review of systems positive for history of sleep apnea with CPAP use.  Surgical procedure discussed.  All questions answered and he wishes to proceed.  I did advise patient to bring his CPAP equipment with him to the hospital.

## 2020-11-05 NOTE — Pre-Procedure Instructions (Signed)
Surgical Instructions:    Your procedure is scheduled on Monday, June 13th (07:30 AM- 09:27 AM).  Report to [facility] Main Entrance "A" at 05:30 A.M., then check in with the Admitting office.  Call this number if you have any questions prior to, or have any problems the morning of surgery:  [phone]    Remember:  Do not eat after midnight the night before your surgery.  You may drink clear liquids until 04:30 AM the morning of your surgery.   Clear liquids allowed are: Water, Non-Citrus Juices (without pulp), Carbonated Beverages, Clear Tea, Black Coffee Only, and Gatorade.   Enhanced Recovery after Surgery for Orthopedics Enhanced Recovery after Surgery is a protocol used to improve the stress on your body and your recovery after surgery.  Patient Instructions  . The day of surgery (if you do NOT have diabetes):  o Drink ONE (1) Pre-Surgery Clear Ensure by 04:30 AM the morning of surgery.   o This drink was given to you during your hospital pre-op appointment visit. o Nothing else to drink after completing the Pre-Surgery Clear Ensure.         If you have questions, please contact your surgeon's office.      Take these medicines the morning of surgery with A SIP OF WATER: PARoxetine (PAXIL)   As of today, STOP taking any Aspirin (unless otherwise instructed by your surgeon), NSAIDs such as diclofenac Sodium (VOLTAREN) GEL, Aleve, Naproxen, Ibuprofen, Motrin, Advil, Goody's, BC's, all herbal medications, CBD Balm, fish oil, and all vitamins.             Special instructions:   [place]- Preparing For Surgery  Before surgery, you can play an important role. Because skin is not sterile, your skin needs to be as free of germs as possible. You can reduce the number of germs on your skin by washing with CHG (chlorahexidine gluconate) Soap before surgery.  CHG is an antiseptic cleaner which kills germs and bonds with the skin to continue killing germs even after washing.     Oral Hygiene is also important to reduce your risk of infection.  Remember - BRUSH YOUR TEETH THE MORNING OF SURGERY WITH YOUR REGULAR TOOTHPASTE  Please do not use if you have an allergy to CHG or antibacterial soaps. If your skin becomes reddened/irritated stop using the CHG.  Do not shave (including legs and underarms) for at least 48 hours prior to first CHG shower. It is OK to shave your face.  Please follow these instructions carefully.   1. Shower the NIGHT BEFORE SURGERY and the MORNING OF SURGERY  2. If you chose to wash your hair, wash your hair first as usual with your normal shampoo.  3. After you shampoo, rinse your hair and body thoroughly to remove the shampoo.  4. Wash Face and genitals (private parts) with your normal soap.   5. Use CHG Soap as you would any other liquid soap. You can apply CHG directly to the skin and wash gently with a scrungie or a clean washcloth.   6. Apply the CHG Soap to your body ONLY FROM THE NECK DOWN.  Do not use on open wounds or open sores. Avoid contact with your eyes, ears, mouth and genitals (private parts). Wash Face and genitals (private parts)  with your normal soap.   7. Wash thoroughly, paying special attention to the area where your surgery will be performed.  8. Thoroughly rinse your body with warm water from the neck  down.  9. DO NOT shower/wash with your normal soap after using and rinsing off the CHG Soap.  10. Pat yourself dry with a CLEAN TOWEL.  11. Wear CLEAN PAJAMAS to bed the night before surgery.  12. Place CLEAN SHEETS on your bed the night before your surgery.  13. DO NOT SLEEP WITH PETS.   Day of Surgery: SHOWER with CHG soap. Brush your teeth WITH YOUR REGULAR TOOTHPASTE. Wear Clean/Comfortable clothing the morning of surgery. Do not apply any deodorants/lotions.   Do not wear jewelry. Men may shave face and neck. Do not bring valuables to the hospital. [facility] is not responsible for any  belongings or valuables.   Do NOT Smoke (Tobacco/Vaping) or drink Alcohol 24 hours prior to your procedure.  If you use a CPAP at night, you may bring all equipment for your overnight stay.   Contacts, glasses, or dentures may not be worn into surgery, please bring cases for these belongings.   For patients admitted to the hospital, discharge time will be determined by your treatment team.   Patients discharged the day of surgery will not be allowed to drive home, and someone needs to stay with them for 24 hours.    Please read over the following fact sheets that you were given.

## 2020-11-06 ENCOUNTER — Encounter (HOSPITAL_COMMUNITY)
Admission: RE | Admit: 2020-11-06 | Discharge: 2020-11-06 | Disposition: A | Discharge status: Home / Self Care | Payer: Federal, State, Local not specified - PPO | Source: Ambulatory Visit | Attending: Orthopaedic Surgery | Admitting: Orthopaedic Surgery

## 2020-11-06 ENCOUNTER — Encounter (HOSPITAL_COMMUNITY): Payer: Self-pay

## 2020-11-06 ENCOUNTER — Other Ambulatory Visit: Payer: Self-pay

## 2020-11-06 DIAGNOSIS — Z01818 Encounter for other preprocedural examination: Secondary | ICD-10-CM | POA: Diagnosis not present

## 2020-11-06 HISTORY — DX: Unspecified osteoarthritis, unspecified site: M19.90

## 2020-11-06 LAB — URINALYSIS, COMPLETE (UACMP) WITH MICROSCOPIC
Bacteria, UA: NONE SEEN
Bilirubin Urine: NEGATIVE
Glucose, UA: NEGATIVE mg/dL
Hgb urine dipstick: NEGATIVE
Ketones, ur: NEGATIVE mg/dL
Leukocytes,Ua: NEGATIVE
Nitrite: NEGATIVE
Protein, ur: NEGATIVE mg/dL
Specific Gravity, Urine: 1.013 (ref 1.005–1.030)
pH: 6 (ref 5.0–8.0)

## 2020-11-06 LAB — COMPREHENSIVE METABOLIC PANEL
ALT: 27 U/L (ref 0–44)
AST: 30 U/L (ref 15–41)
Albumin: 4 g/dL (ref 3.5–5.0)
Alkaline Phosphatase: 53 U/L (ref 38–126)
Anion gap: 13 (ref 5–15)
BUN: 8 mg/dL (ref 6–20)
CO2: 23 mmol/L (ref 22–32)
Calcium: 9.1 mg/dL (ref 8.9–10.3)
Chloride: 101 mmol/L (ref 98–111)
Creatinine, Ser: 1.36 mg/dL — ABNORMAL HIGH (ref 0.61–1.24)
GFR, Estimated: >60 mL/min (ref >60)
Glucose, Bld: 89 mg/dL (ref 70–99)
Potassium: 4.2 mmol/L (ref 3.5–5.1)
Sodium: 137 mmol/L (ref 135–145)
Total Bilirubin: 1 mg/dL (ref 0.3–1.2)
Total Protein: 7.1 g/dL (ref 6.5–8.1)

## 2020-11-06 LAB — CBC
HCT: 42.2 % (ref 39.0–52.0)
Hemoglobin: 13.4 g/dL (ref 13.0–17.0)
MCH: 27.7 pg (ref 26.0–34.0)
MCHC: 31.8 g/dL (ref 30.0–36.0)
MCV: 87.2 fL (ref 80.0–100.0)
Platelets: 265 10*3/uL (ref 150–400)
RBC: 4.84 MIL/uL (ref 4.22–5.81)
RDW: 13.4 % (ref 11.5–15.5)
WBC: 5.5 10*3/uL (ref 4.0–10.5)
nRBC: 0 % (ref 0.0–0.2)

## 2020-11-06 LAB — SURGICAL PCR SCREEN
MRSA, PCR: NEGATIVE
Staphylococcus aureus: NEGATIVE

## 2020-11-06 NOTE — Progress Notes (Signed)
PCP - Denita Lung, MD Cardiologist - Denies  PPM/ICD - Denies  Chest x-ray - N/A EKG - 11/06/20 Stress Test - Denies ECHO - Denies Cardiac Cath - Denies  Sleep Study - Yes, positive for OSA CPAP - ~ Every other night  Pt denies being diabetic.  Blood Thinner Instructions: N/A Aspirin Instructions: N/A  ERAS Protcol - Yes PRE-SURGERY Ensure or G2- Ensure given  COVID TEST- 11/08/20 @ 1405   Anesthesia review: No  Patient denies shortness of breath, fever, cough and chest pain at PAT appointment   All instructions explained to the patient, with a verbal understanding of the material. Patient agrees to go over the instructions while at home for a better understanding. Patient also instructed to self quarantine after being tested for COVID-19. The opportunity to ask questions was provided.

## 2020-11-08 ENCOUNTER — Other Ambulatory Visit (HOSPITAL_COMMUNITY)
Admission: RE | Admit: 2020-11-08 | Discharge: 2020-11-08 | Disposition: A | Discharge status: Home / Self Care | Payer: Federal, State, Local not specified - PPO | Source: Ambulatory Visit | Attending: Orthopaedic Surgery | Admitting: Orthopaedic Surgery

## 2020-11-08 DIAGNOSIS — D219 Benign neoplasm of connective and other soft tissue, unspecified: Secondary | ICD-10-CM | POA: Insufficient documentation

## 2020-11-08 DIAGNOSIS — Z01812 Encounter for preprocedural laboratory examination: Secondary | ICD-10-CM | POA: Insufficient documentation

## 2020-11-08 DIAGNOSIS — Z20822 Contact with and (suspected) exposure to covid-19: Secondary | ICD-10-CM | POA: Diagnosis not present

## 2020-11-08 LAB — SARS CORONAVIRUS 2 (TAT 6-24 HRS): SARS Coronavirus 2: NEGATIVE

## 2020-11-10 NOTE — Anesthesia Preprocedure Evaluation (Addendum)
Anesthesia Evaluation  Patient identified by MRN, date of birth, ID band Patient awake    Reviewed: Allergy & Precautions, NPO status , Patient's Chart, lab work & pertinent test results  Airway Mallampati: II  TM Distance: >3 FB Neck ROM: Full    Dental no notable dental hx. (+) Teeth Intact, Dental Advisory Given   Pulmonary sleep apnea and Continuous Positive Airway Pressure Ventilation ,    Pulmonary exam normal breath sounds clear to auscultation       Cardiovascular Exercise Tolerance: Good hypertension, Normal cardiovascular exam Rhythm:Regular Rate:Normal  11/06/2020 EKG SR R 87    Neuro/Psych negative neurological ROS  negative psych ROS   GI/Hepatic negative GI ROS, Neg liver ROS,   Endo/Other  negative endocrine ROS  Renal/GU negative Renal ROSK+ 4.2 Cr 1.36     Musculoskeletal  (+) Arthritis , Osteoarthritis,    Abdominal (+) + obese,   Peds  Hematology Hgb 13.4 Plt 265   Anesthesia Other Findings All: PCN  Reproductive/Obstetrics                            Anesthesia Physical Anesthesia Plan  ASA: 2  Anesthesia Plan: Spinal   Post-op Pain Management:  Regional for Post-op pain   Induction:   PONV Risk Score and Plan: Treatment may vary due to age or medical condition, Ondansetron and Midazolam  Airway Management Planned: Natural Airway and Nasal Cannula  Additional Equipment: None  Intra-op Plan:   Post-operative Plan:   Informed Consent: I have reviewed the patients History and Physical, chart, labs and discussed the procedure including the risks, benefits and alternatives for the proposed anesthesia with the patient or authorized representative who has indicated his/her understanding and acceptance.     Dental advisory given  Plan Discussed with:   Anesthesia Plan Comments: (Spinal w L adductor canal)       Anesthesia Quick Evaluation

## 2020-11-10 NOTE — H&P (Signed)
TOTAL KNEE ADMISSION H&P  Patient is being admitted for left total knee arthroplasty.  Subjective:  Chief Complaint:left knee pain.   55 year old black male with history of end-stage DJD left knee and pain comes in for preop evaluation.  States that knee symptoms unchanged from previous visit.  He is wanting to proceed with left total knee replacement as scheduled.  Today history and physical performed.  Review of systems positive for history of sleep apnea with CPAP use.  Surgical procedure discussed.  All questions answered and he wishes to proceed.  I did advise patient to bring his CPAP equipment with him to the hospital. HPI: Corey [name], 55 y.o. male, has a history of pain and functional disability in the left knee due to arthritis and has failed non-surgical conservative treatments for greater than 12 weeks to includeNSAID's and/or analgesics, corticosteriod injections, weight reduction as appropriate, and activity modification.  Onset of symptoms was gradual, starting 10 years ago with gradually worsening course since that time. Patient currently rates pain in the left knee(s) at 10 out of 10 with activity. Patient has night pain, worsening of pain with activity and weight bearing, pain that interferes with activities of daily living, pain with passive range of motion, crepitus, and joint swelling.  Patient has evidence of subchondral cysts, subchondral sclerosis, periarticular osteophytes, and joint space narrowing by imaging studies. There is no active infection.  Patient Active Problem List   Diagnosis Date Noted   Leiomyoma 11/08/2020   Unilateral primary osteoarthritis, left knee 10/17/2020   Arthritis 09/26/2018   Onychomycosis 09/26/2018   Class 1 obesity due to excess calories without serious comorbidity with body mass index (BMI) of 32.0 to 32.9 in adult 09/06/2017   Hyperlipidemia LDL goal <100 08/31/2016   OSA on CPAP 08/31/2016   Premature ejaculation 08/19/2011   Past  Medical History:  Diagnosis Date   Arthritis    knee, left   ED (erectile dysfunction)    Hyperlipidemia    Hypertension    Sleep apnea    CPAP - not using lately due to recall     Past Surgical History:  Procedure Laterality Date   ANKLE BONE SURGERY     BACK SURGERY     COLLAR BONE SURGEY     COLONOSCOPY     FRACTURE SURGERY     HERNIA REPAIR     KNEE ARTHROSCOPY Left 2006   LUMBAR MICRODISCECTOMY     POLYPECTOMY     UMBILICAL HERNIA REPAIR      Current Facility-Administered Medications  Medication Dose Route Frequency Provider Last Rate Last Admin   lidocaine-EPINEPHrine (XYLOCAINE W/EPI) 2 %-1:100000 (with pres) injection 1.7 mL  1.7 mL Intradermal Once Denita Lung, MD       Current Outpatient Medications  Medication Sig Dispense Refill Last Dose   diclofenac Sodium (VOLTAREN) 1 % GEL Apply 1 application topically 4 (four) times daily as needed (pain).      Multiple Vitamins-Minerals (MULTIVITAMIN WITH MINERALS) tablet Take 2 tablets by mouth daily.      OVER THE COUNTER MEDICATION Apply 1 application topically daily as needed (pain). CBD Balm      PARoxetine (PAXIL) 20 MG tablet TAKE 1 TABLET BY MOUTH EVERY DAY IN THE MORNING (Patient taking differently: Take 20 mg by mouth daily. TAKE 1 TABLET BY MOUTH EVERY DAY IN THE MORNING) 90 tablet 3    Allergies  Allergen Reactions   Penicillins Other (See Comments)    Makes pt black out  Social History   Tobacco Use   Smoking status: Never   Smokeless tobacco: Never  Substance Use Topics   Alcohol use: Yes    Alcohol/week: 3.0 standard drinks    Types: 3 Glasses of wine per week    Comment: occasional    Family History  Problem Relation Age of Onset   Alzheimer's disease Mother    Stevens-Johnson syndrome Father    Autism Brother    Colon cancer Other    Colon polyps Neg Hx    Esophageal cancer Neg Hx    Rectal cancer Neg Hx    Stomach cancer Neg Hx      Review of Systems  Constitutional:  Positive  for activity change.  HENT: Negative.    Respiratory:  Positive for apnea.   Cardiovascular: Negative.   Gastrointestinal: Negative.   Genitourinary: Negative.   Musculoskeletal:  Positive for gait problem and joint swelling.   Objective:  Physical Exam Constitutional:      Appearance: Normal appearance.  HENT:     Head: Normocephalic and atraumatic.  Eyes:     Extraocular Movements: Extraocular movements intact.     Pupils: Pupils are equal, round, and reactive to light.  Cardiovascular:     Rate and Rhythm: Regular rhythm.     Heart sounds: Normal heart sounds.  Pulmonary:     Effort: Pulmonary effort is normal.     Breath sounds: Normal breath sounds.  Abdominal:     General: Bowel sounds are normal. There is no distension.  Musculoskeletal:        General: Tenderness present.     Cervical back: Normal range of motion.  Neurological:     General: No focal deficit present.     Mental Status: He is alert and oriented to person, place, and time.  Psychiatric:        Mood and Affect: Mood normal.    Vital signs in last 24 hours:    Labs:   Estimated body mass index is 34.17 kg/m as calculated from the following:   Height as of 11/06/20: 5\' 9"  (1.753 m).   Weight as of 11/06/20: 105 kg.   Imaging Review Plain radiographs demonstrate moderate degenerative joint disease of the left knee(s). The overall alignment ismild varus. The bone quality appears to be good for age and reported activity level.      Assessment/Plan:  End stage arthritis, left knee   The patient history, physical examination, clinical judgment of the provider and imaging studies are consistent with end stage degenerative joint disease of the left knee(s) and total knee arthroplasty is deemed medically necessary. The treatment options including medical management, injection therapy arthroscopy and arthroplasty were discussed at length. The risks and benefits of total knee arthroplasty were  presented and reviewed. The risks due to aseptic loosening, infection, stiffness, patella tracking problems, thromboembolic complications and other imponderables were discussed. The patient acknowledged the explanation, agreed to proceed with the plan and consent was signed. Patient is being admitted for inpatient treatment for surgery, pain control, PT, OT, prophylactic antibiotics, VTE prophylaxis, progressive ambulation and ADL's and discharge planning. The patient is planning to be discharged home with home health services     Patient's anticipated LOS is less than 2 midnights, meeting these requirements: - Younger than 12 - Lives within 1 hour of care - Has a competent adult at home to recover with post-op recover - NO history of  - Chronic pain requiring opiods  - Diabetes  -  Coronary Artery Disease  - Heart failure  - Heart attack  - Stroke  - DVT/VTE  - Cardiac arrhythmia  - Respiratory Failure/COPD  - Renal failure  - Anemia  - Advanced Liver disease

## 2020-11-11 ENCOUNTER — Observation Stay (HOSPITAL_COMMUNITY): Payer: Federal, State, Local not specified - PPO

## 2020-11-11 ENCOUNTER — Ambulatory Visit (HOSPITAL_COMMUNITY): Payer: Federal, State, Local not specified - PPO | Admitting: Anesthesiology

## 2020-11-11 ENCOUNTER — Other Ambulatory Visit: Payer: Self-pay

## 2020-11-11 ENCOUNTER — Observation Stay (HOSPITAL_COMMUNITY)
Admission: RE | Admit: 2020-11-11 | Discharge: 2020-11-13 | Disposition: A | Discharge status: Home / Self Care | Payer: Federal, State, Local not specified - PPO | Attending: Orthopaedic Surgery | Admitting: Orthopaedic Surgery

## 2020-11-11 ENCOUNTER — Ambulatory Visit (HOSPITAL_COMMUNITY): Payer: Federal, State, Local not specified - PPO | Admitting: Physician Assistant

## 2020-11-11 ENCOUNTER — Encounter (HOSPITAL_COMMUNITY): Payer: Self-pay | Admitting: Orthopaedic Surgery

## 2020-11-11 ENCOUNTER — Encounter (HOSPITAL_COMMUNITY)
Admission: RE | Disposition: A | Discharge status: Home / Self Care | Payer: Self-pay | Source: Home / Self Care | Attending: Orthopaedic Surgery

## 2020-11-11 DIAGNOSIS — Z09 Encounter for follow-up examination after completed treatment for conditions other than malignant neoplasm: Secondary | ICD-10-CM

## 2020-11-11 DIAGNOSIS — G4733 Obstructive sleep apnea (adult) (pediatric): Secondary | ICD-10-CM | POA: Diagnosis not present

## 2020-11-11 DIAGNOSIS — E785 Hyperlipidemia, unspecified: Secondary | ICD-10-CM | POA: Diagnosis not present

## 2020-11-11 DIAGNOSIS — Z96652 Presence of left artificial knee joint: Secondary | ICD-10-CM

## 2020-11-11 DIAGNOSIS — I10 Essential (primary) hypertension: Secondary | ICD-10-CM | POA: Diagnosis not present

## 2020-11-11 DIAGNOSIS — F524 Premature ejaculation: Secondary | ICD-10-CM

## 2020-11-11 DIAGNOSIS — M1712 Unilateral primary osteoarthritis, left knee: Secondary | ICD-10-CM | POA: Diagnosis not present

## 2020-11-11 DIAGNOSIS — G8918 Other acute postprocedural pain: Secondary | ICD-10-CM | POA: Diagnosis not present

## 2020-11-11 DIAGNOSIS — Z9989 Dependence on other enabling machines and devices: Secondary | ICD-10-CM | POA: Diagnosis not present

## 2020-11-11 HISTORY — PX: TOTAL KNEE ARTHROPLASTY: SHX125

## 2020-11-11 SURGERY — ARTHROPLASTY, KNEE, TOTAL
Anesthesia: Spinal | Site: Knee | Laterality: Left

## 2020-11-11 MED ORDER — METHOCARBAMOL 1000 MG/10ML IJ SOLN
500.0000 mg | Freq: Four times a day (QID) | INTRAVENOUS | Status: DC | PRN
  Filled 2020-11-11: qty 5

## 2020-11-11 MED ORDER — MIDAZOLAM HCL 2 MG/2ML IJ SOLN
INTRAMUSCULAR | Status: AC
  Filled 2020-11-11: qty 2

## 2020-11-11 MED ORDER — VANCOMYCIN HCL IN DEXTROSE 1-5 GM/200ML-% IV SOLN
1000.0000 mg | INTRAVENOUS | Status: AC
  Administered 2020-11-11: 1000 mg via INTRAVENOUS
  Filled 2020-11-11: qty 200

## 2020-11-11 MED ORDER — ACETAMINOPHEN 10 MG/ML IV SOLN
1000.0000 mg | Freq: Once | INTRAVENOUS | Status: DC | PRN
  Administered 2020-11-11: 1000 mg via INTRAVENOUS

## 2020-11-11 MED ORDER — OXYCODONE HCL 5 MG PO TABS: 5.0000 mg | ORAL_TABLET | Freq: Once | ORAL | Status: DC | PRN

## 2020-11-11 MED ORDER — HYDROMORPHONE HCL 1 MG/ML IJ SOLN
0.5000 mg | INTRAMUSCULAR | Status: DC | PRN
Start: 2020-11-11 — End: 2020-11-13
  Administered 2020-11-11 – 2020-11-12 (×3): 0.5 mg via INTRAVENOUS
  Filled 2020-11-11 (×3): qty 0.5

## 2020-11-11 MED ORDER — SODIUM CHLORIDE 0.9 % IV SOLN: INTRAVENOUS | Status: DC

## 2020-11-11 MED ORDER — BUPIVACAINE HCL (PF) 0.25 % IJ SOLN
INTRAMUSCULAR | Status: DC | PRN
  Administered 2020-11-11: 20 mL

## 2020-11-11 MED ORDER — CHLORHEXIDINE GLUCONATE 0.12 % MT SOLN
OROMUCOSAL | Status: AC
  Administered 2020-11-11: 15 mL via OROMUCOSAL
  Filled 2020-11-11: qty 15

## 2020-11-11 MED ORDER — PHENYLEPHRINE 40 MCG/ML (10ML) SYRINGE FOR IV PUSH (FOR BLOOD PRESSURE SUPPORT)
PREFILLED_SYRINGE | INTRAVENOUS | Status: AC
  Filled 2020-11-11: qty 10

## 2020-11-11 MED ORDER — LACTATED RINGERS IV SOLN: INTRAVENOUS | Status: DC

## 2020-11-11 MED ORDER — OXYCODONE HCL 5 MG PO TABS
5.0000 mg | ORAL_TABLET | ORAL | Status: DC | PRN
  Administered 2020-11-11 – 2020-11-13 (×7): 5 mg via ORAL
  Filled 2020-11-11 (×7): qty 1

## 2020-11-11 MED ORDER — ORAL CARE MOUTH RINSE: 15.0000 mL | Freq: Once | OROMUCOSAL | Status: AC

## 2020-11-11 MED ORDER — ACETAMINOPHEN 10 MG/ML IV SOLN
INTRAVENOUS | Status: AC
  Filled 2020-11-11: qty 100

## 2020-11-11 MED ORDER — HYDROMORPHONE HCL 1 MG/ML IJ SOLN
INTRAMUSCULAR | Status: AC
  Filled 2020-11-11: qty 1

## 2020-11-11 MED ORDER — ASPIRIN EC 325 MG PO TBEC
325.0000 mg | DELAYED_RELEASE_TABLET | Freq: Every day | ORAL | Status: DC
  Administered 2020-11-12 – 2020-11-13 (×2): 325 mg via ORAL
  Filled 2020-11-11 (×2): qty 1

## 2020-11-11 MED ORDER — PHENOL 1.4 % MT LIQD: 1.0000 | OROMUCOSAL | Status: DC | PRN

## 2020-11-11 MED ORDER — HYDROMORPHONE HCL 1 MG/ML IJ SOLN
0.2500 mg | INTRAMUSCULAR | Status: DC | PRN
  Administered 2020-11-11: 0.25 mg via INTRAVENOUS

## 2020-11-11 MED ORDER — BUPIVACAINE LIPOSOME 1.3 % IJ SUSP
INTRAMUSCULAR | Status: AC
  Filled 2020-11-11: qty 20

## 2020-11-11 MED ORDER — ONDANSETRON HCL 4 MG/2ML IJ SOLN: 4.0000 mg | Freq: Once | INTRAMUSCULAR | Status: DC | PRN

## 2020-11-11 MED ORDER — FENTANYL CITRATE (PF) 250 MCG/5ML IJ SOLN
INTRAMUSCULAR | Status: AC
  Filled 2020-11-11: qty 5

## 2020-11-11 MED ORDER — PHENYLEPHRINE 40 MCG/ML (10ML) SYRINGE FOR IV PUSH (FOR BLOOD PRESSURE SUPPORT)
PREFILLED_SYRINGE | INTRAVENOUS | Status: DC | PRN
  Administered 2020-11-11 (×5): 80 ug via INTRAVENOUS

## 2020-11-11 MED ORDER — CLONIDINE HCL (ANALGESIA) 100 MCG/ML EP SOLN
EPIDURAL | Status: DC | PRN
  Administered 2020-11-11: 100 ug

## 2020-11-11 MED ORDER — ACETAMINOPHEN 325 MG PO TABS
325.0000 mg | ORAL_TABLET | Freq: Four times a day (QID) | ORAL | Status: DC | PRN
  Administered 2020-11-12: 650 mg via ORAL
  Filled 2020-11-11: qty 2

## 2020-11-11 MED ORDER — CHLORHEXIDINE GLUCONATE 0.12 % MT SOLN: 15.0000 mL | Freq: Once | OROMUCOSAL | Status: AC

## 2020-11-11 MED ORDER — ALBUMIN HUMAN 5 % IV SOLN
INTRAVENOUS | Status: AC
  Filled 2020-11-11: qty 250

## 2020-11-11 MED ORDER — POLYETHYLENE GLYCOL 3350 17 G PO PACK: 17.0000 g | PACK | Freq: Every day | ORAL | Status: DC | PRN

## 2020-11-11 MED ORDER — PROPOFOL 500 MG/50ML IV EMUL
INTRAVENOUS | Status: DC | PRN
  Administered 2020-11-11: 100 ug/kg/min via INTRAVENOUS

## 2020-11-11 MED ORDER — MIDAZOLAM HCL 5 MG/5ML IJ SOLN
INTRAMUSCULAR | Status: DC | PRN
  Administered 2020-11-11: 2 mg via INTRAVENOUS

## 2020-11-11 MED ORDER — OXYCODONE HCL 5 MG/5ML PO SOLN: 5.0000 mg | Freq: Once | ORAL | Status: DC | PRN

## 2020-11-11 MED ORDER — BUPIVACAINE IN DEXTROSE 0.75-8.25 % IT SOLN
INTRATHECAL | Status: DC | PRN
  Administered 2020-11-11: 15 mg via INTRATHECAL

## 2020-11-11 MED ORDER — MENTHOL 3 MG MT LOZG: 1.0000 | LOZENGE | OROMUCOSAL | Status: DC | PRN

## 2020-11-11 MED ORDER — METOCLOPRAMIDE HCL 5 MG/ML IJ SOLN
5.0000 mg | Freq: Three times a day (TID) | INTRAMUSCULAR | Status: DC | PRN
Start: 2020-11-11 — End: 2020-11-13

## 2020-11-11 MED ORDER — LACTATED RINGERS IV BOLUS
500.0000 mL | Freq: Once | INTRAVENOUS | Status: AC
  Administered 2020-11-11: 500 mL via INTRAVENOUS

## 2020-11-11 MED ORDER — PAROXETINE HCL 20 MG PO TABS
20.0000 mg | ORAL_TABLET | Freq: Every day | ORAL | Status: DC
  Administered 2020-11-11 – 2020-11-13 (×3): 20 mg via ORAL
  Filled 2020-11-11 (×3): qty 1

## 2020-11-11 MED ORDER — BUPIVACAINE LIPOSOME 1.3 % IJ SUSP
INTRAMUSCULAR | Status: DC | PRN
  Administered 2020-11-11: 20 mL

## 2020-11-11 MED ORDER — METOCLOPRAMIDE HCL 5 MG PO TABS: 5.0000 mg | ORAL_TABLET | Freq: Three times a day (TID) | ORAL | Status: DC | PRN

## 2020-11-11 MED ORDER — ALBUMIN HUMAN 5 % IV SOLN
12.5000 g | Freq: Once | INTRAVENOUS | Status: AC
  Administered 2020-11-11: 12.5 g via INTRAVENOUS

## 2020-11-11 MED ORDER — METHOCARBAMOL 500 MG PO TABS
500.0000 mg | ORAL_TABLET | Freq: Four times a day (QID) | ORAL | Status: DC | PRN
  Administered 2020-11-11: 500 mg via ORAL
  Filled 2020-11-11: qty 1

## 2020-11-11 MED ORDER — DOCUSATE SODIUM 100 MG PO CAPS
100.0000 mg | ORAL_CAPSULE | Freq: Two times a day (BID) | ORAL | Status: DC
  Administered 2020-11-11 – 2020-11-13 (×4): 100 mg via ORAL
  Filled 2020-11-11 (×4): qty 1

## 2020-11-11 MED ORDER — SODIUM CHLORIDE 0.9 % IR SOLN
Status: DC | PRN
  Administered 2020-11-11: 3000 mL

## 2020-11-11 MED ORDER — ROPIVACAINE HCL 7.5 MG/ML IJ SOLN
INTRAMUSCULAR | Status: DC | PRN
  Administered 2020-11-11: 20 mL via PERINEURAL

## 2020-11-11 MED ORDER — AMISULPRIDE (ANTIEMETIC) 5 MG/2ML IV SOLN: 10.0000 mg | Freq: Once | INTRAVENOUS | Status: DC | PRN

## 2020-11-11 MED ORDER — FENTANYL CITRATE (PF) 250 MCG/5ML IJ SOLN
INTRAMUSCULAR | Status: DC | PRN
  Administered 2020-11-11: 100 ug via INTRAVENOUS

## 2020-11-11 MED ORDER — ONDANSETRON HCL 4 MG PO TABS: 4.0000 mg | ORAL_TABLET | Freq: Four times a day (QID) | ORAL | Status: DC | PRN

## 2020-11-11 MED ORDER — BUPIVACAINE HCL (PF) 0.25 % IJ SOLN
INTRAMUSCULAR | Status: AC
  Filled 2020-11-11: qty 30

## 2020-11-11 MED ORDER — ONDANSETRON HCL 4 MG/2ML IJ SOLN: 4.0000 mg | Freq: Four times a day (QID) | INTRAMUSCULAR | Status: DC | PRN

## 2020-11-11 SURGICAL SUPPLY — 81 items
APL SKNCLS STERI-STRIP NONHPOA (GAUZE/BANDAGES/DRESSINGS) ×1
ATTUNE MED DOME PAT 41 KNEE (Knees) ×1 IMPLANT
ATTUNE PS FEM LT SZ 7 CEM KNEE (Femur) ×1 IMPLANT
ATTUNE PSRP INSR SZ7 5 KNEE (Insert) ×1 IMPLANT
BANDAGE ESMARK 6X9 LF (GAUZE/BANDAGES/DRESSINGS) ×1 IMPLANT
BASE TIBIAL ROT PLAT SZ 7 KNEE (Knees) IMPLANT
BENZOIN TINCTURE PRP APPL 2/3 (GAUZE/BANDAGES/DRESSINGS) ×2 IMPLANT
BLADE SAGITTAL 25.0X1.19X90 (BLADE) ×2 IMPLANT
BLADE SAW SGTL 13X75X1.27 (BLADE) ×2 IMPLANT
BNDG CMPR 9X6 STRL LF SNTH (GAUZE/BANDAGES/DRESSINGS) ×1
BNDG CMPR MED 10X6 ELC LF (GAUZE/BANDAGES/DRESSINGS) ×1
BNDG ELASTIC 4X5.8 VLCR STR LF (GAUZE/BANDAGES/DRESSINGS) ×2 IMPLANT
BNDG ELASTIC 6X10 VLCR STRL LF (GAUZE/BANDAGES/DRESSINGS) ×1 IMPLANT
BNDG ESMARK 6X9 LF (GAUZE/BANDAGES/DRESSINGS) ×2
BOWL SMART MIX CTS (DISPOSABLE) ×2 IMPLANT
BSPLAT TIB 7 CMNT ROT PLAT STR (Knees) ×1 IMPLANT
CEMENT HV SMART SET (Cement) ×4 IMPLANT
COVER SURGICAL LIGHT HANDLE (MISCELLANEOUS) ×2 IMPLANT
COVER WAND RF STERILE (DRAPES) ×2 IMPLANT
CUFF TOURN SGL QUICK 34 (TOURNIQUET CUFF) ×2
CUFF TOURN SGL QUICK 42 (TOURNIQUET CUFF) IMPLANT
CUFF TRNQT CYL 34X4.125X (TOURNIQUET CUFF) ×1 IMPLANT
DRAPE ORTHO SPLIT 77X108 STRL (DRAPES) ×4
DRAPE SURG ORHT 6 SPLT 77X108 (DRAPES) ×2 IMPLANT
DRAPE U-SHAPE 47X51 STRL (DRAPES) ×2 IMPLANT
DRSG MEPILEX BORDER 4X12 (GAUZE/BANDAGES/DRESSINGS) ×1 IMPLANT
DRSG PAD ABDOMINAL 8X10 ST (GAUZE/BANDAGES/DRESSINGS) ×2 IMPLANT
DURAPREP 26ML APPLICATOR (WOUND CARE) ×4 IMPLANT
ELECT REM PT RETURN 9FT ADLT (ELECTROSURGICAL) ×2
ELECTRODE REM PT RTRN 9FT ADLT (ELECTROSURGICAL) ×1 IMPLANT
EVACUATOR 1/8 PVC DRAIN (DRAIN) IMPLANT
FACESHIELD WRAPAROUND (MASK) ×4 IMPLANT
FACESHIELD WRAPAROUND OR TEAM (MASK) ×2 IMPLANT
GAUZE SPONGE 4X4 12PLY STRL (GAUZE/BANDAGES/DRESSINGS) ×2 IMPLANT
GAUZE XEROFORM 5X9 LF (GAUZE/BANDAGES/DRESSINGS) ×2 IMPLANT
GLOVE ORTHO TXT STRL SZ7.5 (GLOVE) ×4 IMPLANT
GLOVE SRG 8 PF TXTR STRL LF DI (GLOVE) ×2 IMPLANT
GLOVE SURG UNDER POLY LF SZ8 (GLOVE) ×4
GOWN STRL REUS W/ TWL LRG LVL3 (GOWN DISPOSABLE) ×1 IMPLANT
GOWN STRL REUS W/ TWL XL LVL3 (GOWN DISPOSABLE) ×1 IMPLANT
GOWN STRL REUS W/TWL 2XL LVL3 (GOWN DISPOSABLE) ×2 IMPLANT
GOWN STRL REUS W/TWL LRG LVL3 (GOWN DISPOSABLE) ×2
GOWN STRL REUS W/TWL XL LVL3 (GOWN DISPOSABLE) ×2
HANDPIECE INTERPULSE COAX TIP (DISPOSABLE) ×2
IMMOBILIZER KNEE 22 UNIV (SOFTGOODS) ×2 IMPLANT
KIT BASIN OR (CUSTOM PROCEDURE TRAY) ×2 IMPLANT
KIT TURNOVER KIT B (KITS) ×2 IMPLANT
MANIFOLD NEPTUNE II (INSTRUMENTS) ×2 IMPLANT
MARKER SKIN DUAL TIP RULER LAB (MISCELLANEOUS) ×2 IMPLANT
NDL 18GX1X1/2 (RX/OR ONLY) (NEEDLE) ×1 IMPLANT
NDL HYPO 25GX1X1/2 BEV (NEEDLE) ×1 IMPLANT
NEEDLE 18GX1X1/2 (RX/OR ONLY) (NEEDLE) ×2 IMPLANT
NEEDLE HYPO 25GX1X1/2 BEV (NEEDLE) ×2 IMPLANT
NS IRRIG 1000ML POUR BTL (IV SOLUTION) ×2 IMPLANT
PACK TOTAL JOINT (CUSTOM PROCEDURE TRAY) ×2 IMPLANT
PAD ABD 8X10 STRL (GAUZE/BANDAGES/DRESSINGS) ×1 IMPLANT
PAD ARMBOARD 7.5X6 YLW CONV (MISCELLANEOUS) ×4 IMPLANT
PAD CAST 4YDX4 CTTN HI CHSV (CAST SUPPLIES) ×1 IMPLANT
PADDING CAST ABS 6INX4YD NS (CAST SUPPLIES) ×1
PADDING CAST ABS COTTON 6X4 NS (CAST SUPPLIES) IMPLANT
PADDING CAST COTTON 4X4 STRL (CAST SUPPLIES) ×2
PADDING CAST COTTON 6X4 STRL (CAST SUPPLIES) ×2 IMPLANT
PIN DRILL FIX HALF THREAD (BIT) ×1 IMPLANT
PIN STEINMAN FIXATION KNEE (PIN) ×1 IMPLANT
SET HNDPC FAN SPRY TIP SCT (DISPOSABLE) ×1 IMPLANT
STAPLER VISISTAT 35W (STAPLE) IMPLANT
SUCTION FRAZIER HANDLE 10FR (MISCELLANEOUS) ×2
SUCTION TUBE FRAZIER 10FR DISP (MISCELLANEOUS) ×1 IMPLANT
SUT VIC AB 0 CT1 27 (SUTURE) ×2
SUT VIC AB 0 CT1 27XBRD ANBCTR (SUTURE) ×1 IMPLANT
SUT VIC AB 1 CTX 36 (SUTURE) ×4
SUT VIC AB 1 CTX36XBRD ANBCTR (SUTURE) ×2 IMPLANT
SUT VIC AB 2-0 CT1 27 (SUTURE) ×4
SUT VIC AB 2-0 CT1 TAPERPNT 27 (SUTURE) ×2 IMPLANT
SUT VIC AB 3-0 X1 27 (SUTURE) ×2 IMPLANT
SYR 50ML LL SCALE MARK (SYRINGE) ×2 IMPLANT
SYR CONTROL 10ML LL (SYRINGE) ×2 IMPLANT
TIBIAL BASE ROT PLAT SZ 7 KNEE (Knees) ×2 IMPLANT
TOWEL GREEN STERILE (TOWEL DISPOSABLE) ×2 IMPLANT
TOWEL GREEN STERILE FF (TOWEL DISPOSABLE) ×2 IMPLANT
TRAY CATH 16FR W/PLASTIC CATH (SET/KITS/TRAYS/PACK) IMPLANT

## 2020-11-11 NOTE — Evaluation (Signed)
Physical Therapy Evaluation Patient Details Name: Corey [name] MRN: [id number] DOB: 1965-09-30 Today's Date: 11/11/2020   History of Present Illness  Pt adm 6/13 for Lt TKR. PMH - osteoarthritis, HTN, back surgery.  Clinical Impression  Pt admitted with above diagnosis and presents to PT with functional limitations due to deficits listed below (See PT problem list). Pt needs skilled PT to maximize independence. Pt plans to return home with assist of significant other.      Follow Up Recommendations Follow surgeon's recommendation for DC plan and follow-up therapies    Equipment Recommendations  3in1 (PT)    Recommendations for Other Services       Precautions / Restrictions Precautions Precautions: Knee      Mobility  Bed Mobility Overal bed mobility: Needs Assistance Bed Mobility: Supine to Sit     Supine to sit: Min assist     General bed mobility comments: Assist to bring LLE off of bed    Transfers Overall transfer level: Needs assistance Equipment used: Rolling walker (2 wheeled) Transfers: Sit to/from Stand Sit to Stand: Min assist         General transfer comment: Assist for stability to rise. Verbal cues for hand placement  Ambulation/Gait Ambulation/Gait assistance: Min guard Gait Distance (Feet): 40 Feet Assistive device: Rolling walker (2 wheeled) Gait Pattern/deviations: Step-through pattern;Decreased stance time - right Gait velocity: decr Gait velocity interpretation: <1.31 ft/sec, indicative of household ambulator General Gait Details: Assist for safety.  Stairs            Wheelchair Mobility    Modified Rankin (Stroke Patients Only)       Balance Overall balance assessment: No apparent balance deficits (not formally assessed)                                           Pertinent Vitals/Pain Pain Assessment: 0-10 Pain Score: 8  Pain Location: lt knee Pain Descriptors / Indicators:  Grimacing;Guarding;Aching Pain Intervention(s): Monitored during session;Repositioned    Home Living Family/patient expects to be discharged to:: Private residence Living Arrangements: Alone Available Help at Discharge: Family (significant other) Type of Home: House Home Access: Stairs to enter Entrance Stairs-Rails: None Entrance Stairs-Number of Steps: 2 Home Layout: One level Home Equipment: Environmental consultant - 2 wheels;Cane - single point      Prior Function Level of Independence: Independent         Comments: Works in a Education officer, community        Extremity/Trunk Assessment   Upper Extremity Assessment Upper Extremity Assessment: Overall WFL for tasks assessed    Lower Extremity Assessment Lower Extremity Assessment: LLE deficits/detail LLE Deficits / Details: Poor quad. Knee ROM 0-65 active assist in sitting       Communication   Communication: No difficulties  Cognition Arousal/Alertness: Awake/alert Behavior During Therapy: WFL for tasks assessed/performed Overall Cognitive Status: Within Functional Limits for tasks assessed                                        General Comments General comments (skin integrity, edema, etc.): VSS on RA    Exercises Total Joint Exercises Quad Sets: AAROM;Left;5 reps;Supine Long Arc Quad: AAROM;Left;5 reps;Seated Knee Flexion: AAROM;Left;5 reps;Seated Goniometric ROM: 0-65   Assessment/Plan    PT Assessment  Patient needs continued PT services  PT Problem List Decreased strength;Decreased range of motion;Decreased mobility;Pain       PT Treatment Interventions DME instruction;Gait training;Stair training;Functional mobility training;Therapeutic activities;Therapeutic exercise;Patient/family education    PT Goals (Current goals can be found in the Care Plan section)  Acute Rehab PT Goals Patient Stated Goal: return home PT Goal Formulation: With patient Time For Goal Achievement:  11/15/20 Potential to Achieve Goals: Good    Frequency 7X/week   Barriers to discharge Inaccessible home environment stairs to enter    Co-evaluation               AM-PAC PT "6 Clicks" Mobility  Outcome Measure Help needed turning from your back to your side while in a flat bed without using bedrails?: A Little Help needed moving from lying on your back to sitting on the side of a flat bed without using bedrails?: A Little Help needed moving to and from a bed to a chair (including a wheelchair)?: A Little Help needed standing up from a chair using your arms (e.g., wheelchair or bedside chair)?: A Little Help needed to walk in hospital room?: A Little Help needed climbing 3-5 steps with a railing? : A Little 6 Click Score: 18    End of Session Equipment Utilized During Treatment: Gait belt Activity Tolerance: Patient tolerated treatment well Patient left: in chair;with call bell/phone within reach;with chair alarm set;with family/visitor present Nurse Communication: Mobility status PT Visit Diagnosis: Other abnormalities of gait and mobility (R26.89);Pain Pain - Right/Left: Left Pain - part of body: Knee    Time: 1701-1733 PT Time Calculation (min) (ACUTE ONLY): 32 min   Charges:   PT Evaluation $PT Eval Low Complexity: 1 Low PT Treatments $Gait Training: 8-22 mins        [place] Pager [phone] [place] 11/11/2020, 5:54 PM

## 2020-11-11 NOTE — Discharge Instructions (Addendum)
INSTRUCTIONS AFTER JOINT REPLACEMENT   Remove items at home which could result in a fall. This includes throw rugs or furniture in walking pathways ICE to the affected joint every three hours while awake for 30 minutes at a time, for at least the first 3-5 days, and then as needed for pain and swelling.  Continue to use ice for pain and swelling. You may notice swelling that will progress down to the foot and ankle.  This is normal after surgery.  Elevate your leg when you are not up walking on it.   Continue to use the breathing machine you got in the hospital (incentive spirometer) which will help keep your temperature down.  It is common for your temperature to cycle up and down following surgery, especially at night when you are not up moving around and exerting yourself.  The breathing machine keeps your lungs expanded and your temperature down.   DIET:  As you were doing prior to hospitalization, we recommend a well-balanced diet.  DRESSING / WOUND CARE / SHOWERING  Okay to shower 1 days postop and get incision wet.  No tub soaking.  Do not apply any creams or ointments to incision.  Your dressing is water proof and you can leave it on until you see Dr. Lorin Mercy next week.   ACTIVITY  Increase activity slowly as tolerated, but follow the weight bearing instructions below.   No driving for 6 weeks or until further direction given by your physician.  You cannot drive while taking narcotics.  No lifting or carrying greater than 10 lbs. until further directed by your surgeon. Avoid periods of inactivity such as sitting longer than an hour when not asleep. This helps prevent blood clots.  You may return to work once you are authorized by your doctor.     WEIGHT BEARING   Weight bearing as tolerated with assist device (walker, cane, etc) as directed, use it as long as suggested by your surgeon or therapist, typically at least 4-6 weeks.   EXERCISES  Results after joint replacement surgery  are often greatly improved when you follow the exercise, range of motion and muscle strengthening exercises prescribed by your doctor. Safety measures are also important to protect the joint from further injury. Any time any of these exercises cause you to have increased pain or swelling, decrease what you are doing until you are comfortable again and then slowly increase them. If you have problems or questions, call your caregiver or physical therapist for advice.   Rehabilitation is important following a joint replacement. After just a few days of immobilization, the muscles of the leg can become weakened and shrink (atrophy).  These exercises are designed to build up the tone and strength of the thigh and leg muscles and to improve motion. Often times heat used for twenty to thirty minutes before working out will loosen up your tissues and help with improving the range of motion but do not use heat for the first two weeks following surgery (sometimes heat can increase post-operative swelling).   These exercises can be done on a training (exercise) mat, on the floor, on a table or on a bed. Use whatever works the best and is most comfortable for you.    Use music or television while you are exercising so that the exercises are a pleasant break in your day. This will make your life better with the exercises acting as a break in your routine that you can look forward to.  Perform all exercises about fifteen times, three times per day or as directed.  You should exercise both the operative leg and the other leg as well.  Exercises include:   Quad Sets - Tighten up the muscle on the front of the thigh (Quad) and hold for 5-10 seconds.   Straight Leg Raises - With your knee straight (if you were given a brace, keep it on), lift the leg to 60 degrees, hold for 3 seconds, and slowly lower the leg.  Perform this exercise against resistance later as your leg gets stronger.  Leg Slides: Lying on your back, slowly  slide your foot toward your buttocks, bending your knee up off the floor (only go as far as is comfortable). Then slowly slide your foot back down until your leg is flat on the floor again.  Angel Wings: Lying on your back spread your legs to the side as far apart as you can without causing discomfort.  Hamstring Strength:  Lying on your back, push your heel against the floor with your leg straight by tightening up the muscles of your buttocks.  Repeat, but this time bend your knee to a comfortable angle, and push your heel against the floor.  You may put a pillow under the heel to make it more comfortable if necessary.   A rehabilitation program following joint replacement surgery can speed recovery and prevent re-injury in the future due to weakened muscles. Contact your doctor or a physical therapist for more information on knee rehabilitation.    CONSTIPATION  Constipation is defined medically as fewer than three stools per week and severe constipation as less than one stool per week.  Even if you have a regular bowel pattern at home, your normal regimen is likely to be disrupted due to multiple reasons following surgery.  Combination of anesthesia, postoperative narcotics, change in appetite and fluid intake all can affect your bowels.   YOU MUST use at least one of the following options; they are listed in order of increasing strength to get the job done.  They are all available over the counter, and you may need to use some, POSSIBLY even all of these options:    Drink plenty of fluids (prune juice may be helpful) and high fiber foods Colace 100 mg by mouth twice a day  Senokot for constipation as directed and as needed Dulcolax (bisacodyl), take with full glass of water  Miralax (polyethylene glycol) once or twice a day as needed.  If you have tried all these things and are unable to have a bowel movement in the first 3-4 days after surgery call either your surgeon or your primary doctor.     If you experience loose stools or diarrhea, hold the medications until you stool forms back up.  If your symptoms do not get better within 1 week or if they get worse, check with your doctor.  If you experience "the worst abdominal pain ever" or develop nausea or vomiting, please contact the office immediately for further recommendations for treatment.   ITCHING:  If you experience itching with your medications, try taking only a single pain pill, or even half a pain pill at a time.  You can also use Benadryl over the counter for itching or also to help with sleep.   TED HOSE STOCKINGS:  Use stockings on both legs until for at least 2 weeks or as directed by physician office. They may be removed at night for sleeping.  MEDICATIONS:  See  your medication summary on the "After Visit Summary" that nursing will review with you.  You may have some home medications which will be placed on hold until you complete the course of blood thinner medication.  It is important for you to complete the blood thinner medication as prescribed.  PRECAUTIONS:  If you experience chest pain or shortness of breath - call 911 immediately for transfer to the hospital emergency department.   If you develop a fever greater that 101 F, purulent drainage from wound, increased redness or drainage from wound, foul odor from the wound/dressing, or calf pain - CONTACT YOUR SURGEON.                                                   FOLLOW-UP APPOINTMENTS:  If you do not already have a post-op appointment, please call the office for an appointment to be seen by your surgeon.  Guidelines for how soon to be seen are listed in your "After Visit Summary", but are typically between 1-4 weeks after surgery.  OTHER INSTRUCTIONS:   Knee Replacement:  Do not place pillow under knee, focus on keeping the knee straight while resting. CPM instructions: 0-90 degrees, 2 hours in the morning, 2 hours in the afternoon, and 2 hours in the evening.  Place foam block, curve side up under heel at all times except when in CPM or when walking.  DO NOT modify, tear, cut, or change the foam block in any way.  POST-OPERATIVE OPIOID TAPER INSTRUCTIONS: It is important to wean off of your opioid medication as soon as possible. If you do not need pain medication after your surgery it is ok to stop day one. Opioids include: Codeine, Hydrocodone(Norco, Vicodin), Oxycodone(Percocet, oxycontin) and hydromorphone amongst others.  Long term and even short term use of opiods can cause: Increased pain response Dependence Constipation Depression Respiratory depression And more.  Withdrawal symptoms can include Flu like symptoms Nausea, vomiting And more Techniques to manage these symptoms Hydrate well Eat regular healthy meals Stay active Use relaxation techniques(deep breathing, meditating, yoga) Do Not substitute Alcohol to help with tapering If you have been on opioids for less than two weeks and do not have pain than it is ok to stop all together.  Plan to wean off of opioids This plan should start within one week post op of your joint replacement. Maintain the same interval or time between taking each dose and first decrease the dose.  Cut the total daily intake of opioids by one tablet each day Next start to increase the time between doses. The last dose that should be eliminated is the evening dose.   MAKE SURE YOU:  Understand these instructions.  Get help right away if you are not doing well or get worse.    Thank you for letting us be a part of your medical care team.  It is a privilege we respect greatly.  We hope these instructions will help you stay on track for a fast and full recovery!     Dental Antibiotics:  In most cases prophylactic antibiotics for Dental procdeures after total joint surgery are not necessary.  Exceptions are as follows:  1. History of prior total joint infection  2. Severely immunocompromised  (Organ Transplant, cancer chemotherapy, Rheumatoid biologic meds such as [place])  3. Poorly controlled diabetes (A1C &gt; 8.0,  blood glucose over 200)  If you have one of these conditions, contact your surgeon for an antibiotic prescription, prior to your dental procedure.

## 2020-11-11 NOTE — Anesthesia Procedure Notes (Addendum)
Spinal  Patient location during procedure: OR Start time: 11/11/2020 7:33 AM End time: 11/11/2020 7:38 AM Reason for block: surgical anesthesia Staffing Anesthesiologist: Barnet Glasgow, MD Preanesthetic Checklist Completed: patient identified, IV checked, site marked, risks and benefits discussed, surgical consent, monitors and equipment checked, pre-op evaluation and timeout performed Spinal Block Patient position: sitting Prep: DuraPrep Patient monitoring: heart rate, cardiac monitor, continuous pulse ox and blood pressure Approach: midline Location: L2-3 Injection technique: single-shot Needle Needle type: Sprotte  Needle gauge: 24 G Needle length: 9 cm Needle insertion depth: 7 cm Assessment Sensory level: T4 Events: CSF return Additional Notes 1 Atempt patient tolerated procedure well

## 2020-11-11 NOTE — Op Note (Signed)
Preop diagnosis: Left knee primary osteoarthritis  Postop diagnosis: Same  Procedure: Left total knee arthroplasty, cemented.  Anesthesia: Preoperative abductor block plus spinal plus Exparel and Marcaine 20+20 = 40 cc.  Surgeon: Rodell Perna MD  Assistant: Benjiman Core, PA-C medically necessary and present for all all bone cuts cementing and position of the prosthesis as well as closure.  Tourniquet time 59 minutes x 300.  ImplantsImplants   CEMENT HV SMART SET - [id number]  Inventory Item: CEMENT HV SMART SET Serial no.:  Model/Cat no.: [id number]  Implant name: CEMENT HV SMART SET - [id number] Laterality: Left Area: Knee  Manufacturer: [employer] Date of Manufacture:    Action: Implanted Number Used: 2   Device Identifier:  Device Identifier Type:     ATTUNE PS FEM LT SZ 7 CEM KNEE - [id number]  Inventory Item: ATTUNE PS FEM LT SZ 7 CEM KNEE Serial no.:  Model/Cat no.: [id number]  Implant name: ATTUNE PS FEM LT SZ 7 CEM KNEE - [id number] Laterality: Left Area: Knee  Manufacturer: [employer] Date of Manufacture:    Action: Implanted Number Used: 1   Device Identifier:  Device Identifier Type:     TIBIAL BASE ROT PLAT SZ 7 KNEE - [id number]  Inventory Item: TIBIAL BASE ROT PLAT SZ 7 KNEE Serial no.:  Model/Cat no.: [id number]  Implant name: TIBIAL BASE ROT PLAT SZ 7 KNEE - [id number] Laterality: Left Area: Knee  Manufacturer: [employer] Date of Manufacture:    Action: Implanted Number Used: 1   Device Identifier:  Device Identifier Type:     ATTUNE PSRP INSR SZ7 5MM KNEE - [id number]  Inventory Item: ATTUNE PSRP INSR SZ7 5MM KNEE Serial no.:  Model/Cat no.: [id number]  Implant name: ATTUNE PSRP INSR SZ7 5MM KNEE - [id number] Laterality: Left Area: Knee  Manufacturer: [employer] Date of Manufacture:    Action: Implanted Number Used: 1   Device Identifier:  Device Identifier Type:     ATTUNE MED DOME PAT 41MM KNEE - [id number]  Inventory Item: ATTUNE  MED DOME PAT 41MM KNEE Serial no.:  Model/Cat no.: [id number]  Implant name: ATTUNE MED DOME PAT 41MM KNEE - [id number] Laterality: Left Area: Knee  Manufacturer: [employer] Date of Manufacture:    Action: Implanted Number Used: 1   Device Identifier:  Device Identifier Type:    Procedure: After induction of spinal anesthesia lateral post heel bump proximal thigh tourniquet prepping with DuraPrep preoperative vancomycin due to patient's penicillin allergy.  He was prepped from the tourniquet the tip of toes usual extremity sheets drapes impervious stockinette Coban sterile skin marker Betadine Steri-Drape sealing the skin was applied.  Timeout procedure was completed.  Leg with wrapped with Esmarch tourniquet inflated.  Midline incision was made.  Medial parapatellar incision was made.  Prepatellar fat pad was thinned.  Large spurs were removed there is tricompartmental degenerative arthritis with bone-on-bone changes worse in the medial compartment and patellofemoral compartment.  10 mm resected off the patella intramedullary hole drilled in the femur and initially 10 was cut we came back and took an additional 2 mm after the first past with the saw showed an adequate bone resection.  We pulled out the angled pin backed up 2 mm and then made repeat cuts.  10 was taken off of the tibia.  Patient had old ACL tear with close down notch PCL was still present.  This had increased the slope of the tibia and resection was made on the tibia taken 10 to make sure  we took bone posteriorly in the tibial plateau.  Spacer block 6 allowed full extension.  Chamfer cuts were made on the femur tibial cuts was made with the sizing for a #7.  Box cut made on the femur trials with full extension with a 5 mm Attune rotating poly-.  Patella was a 41 and 3 holes were drilled for 3 peg patella as well as lug knots in the femur.  Trials removed pulse lavage.  Vacuum mixing of the cement.  Tibia was cemented followed by femur  placement of poly and then the patella held with a clamp till cement was hardened.  While cement was setting up Exparel Marcaine was injected.  Cement was hardened 15 minutes tourniquet deflated hemostasis obtained tourniquet time was 59 minutes.  Standard layered closure was performed with #1 interrupted Vicryl 2 on subtenons tissue skin staple closure postop dressing knee immobilizer.  Patient tolerated procedure well transferred recovery in stable condition.

## 2020-11-11 NOTE — Progress Notes (Signed)
Orthopedic Tech Progress Note Patient Details:  Corey [name] [birth year] [id number]  CPM Left Knee CPM Left Knee: On Left Knee Flexion (Degrees): 0 Left Knee Extension (Degrees): 90 Additional Comments: fresh ice  Post Interventions Patient Tolerated: Well Instructions Provided: Care of device  Janit Pagan 11/11/2020, 11:11 AM

## 2020-11-11 NOTE — Transfer of Care (Signed)
Immediate Anesthesia Transfer of Care Note  Patient: Corey [name]  Procedure(s) Performed: LEFT TOTAL KNEE ARTHROPLASTY (Left: Knee)  Patient Location: PACU  Anesthesia Type:MAC and Spinal  Level of Consciousness: awake and alert   Airway & Oxygen Therapy: Patient Spontanous Breathing and Patient connected to nasal cannula oxygen  Post-op Assessment: Report given to RN and Post -op Vital signs reviewed and stable  Post vital signs: Reviewed and stable  Last Vitals:  Vitals Value Taken Time  BP 101/70 11/11/20 0949  Temp    Pulse 80 11/11/20 0951  Resp 10 11/11/20 0951  SpO2 98 % 11/11/20 0951  Vitals shown include unvalidated device data.  Last Pain:  Vitals:   11/11/20 0628  TempSrc:   PainSc: 5       Patients Stated Pain Goal: 5 (89/38/10 1751)  Complications: No notable events documented.

## 2020-11-11 NOTE — Progress Notes (Signed)
Called and notified Jeneen Rinks, [place] about vagal episodes-HR dropping to the 30s-40s BP 70s, patient quickly recovered from both episodes and anesthesia was made aware, order for cardiac telemetry for 24 hrs.  Rowe Pavy, RN

## 2020-11-11 NOTE — Anesthesia Procedure Notes (Addendum)
Procedure Name: MAC Date/Time: 11/11/2020 7:49 AM Performed by: Lieutenant Diego, CRNA Pre-anesthesia Checklist: Patient identified, Emergency Drugs available, Suction available, Patient being monitored and Timeout performed Patient Re-evaluated:Patient Re-evaluated prior to induction Oxygen Delivery Method: Nasal cannula Preoxygenation: Pre-oxygenation with 100% oxygen Induction Type: IV induction

## 2020-11-11 NOTE — Addendum Note (Signed)
Addendum  created 11/11/20 1253 by Barnet Glasgow, MD   Intraprocedure Meds edited

## 2020-11-11 NOTE — Interval H&P Note (Signed)
History and Physical Interval Note:  11/11/2020 7:25 AM  Corey [name]  has presented today for surgery, with the diagnosis of left knee osteoarthritis.  The various methods of treatment have been discussed with the patient and family. After consideration of risks, benefits and other options for treatment, the patient has consented to  Procedure(s): LEFT TOTAL KNEE ARTHROPLASTY (Left) as a surgical intervention.  The patient's history has been reviewed, patient examined, no change in status, stable for surgery.  I have reviewed the patient's chart and labs.  Questions were answered to the patient's satisfaction.     Marybelle Killings

## 2020-11-11 NOTE — Anesthesia Postprocedure Evaluation (Signed)
Anesthesia Post Note  Patient: Corey [name]  Procedure(s) Performed: LEFT TOTAL KNEE ARTHROPLASTY (Left: Knee)     Patient location during evaluation: Nursing Unit Anesthesia Type: Spinal Level of consciousness: oriented and awake and alert Pain management: pain level controlled Vital Signs Assessment: post-procedure vital signs reviewed and stable Respiratory status: spontaneous breathing and respiratory function stable Cardiovascular status: blood pressure returned to baseline and stable Postop Assessment: no headache, no backache, no apparent nausea or vomiting and patient able to bend at knees Anesthetic complications: no   No notable events documented.  Last Vitals:  Vitals:   11/11/20 1205 11/11/20 1220  BP: 104/77 134/88  Pulse: 66 70  Resp: 15 13  Temp:    SpO2: 100% 100%    Last Pain:  Vitals:   11/11/20 1220  TempSrc:   PainSc: 0-No pain    LLE Motor Response: No movement due to regional block (11/11/20 1220) LLE Sensation: Decreased (11/11/20 1220) RLE Motor Response: No movement to painful stimulus;Responds to commands (11/11/20 1220) RLE Sensation: Decreased (11/11/20 1220) L Sensory Level: L4-Anterior knee, lower leg (11/11/20 1220) R Sensory Level: S1-Sole of foot, small toes (11/11/20 1220)  Barnet Glasgow

## 2020-11-11 NOTE — Anesthesia Procedure Notes (Addendum)
Anesthesia Regional Block: Adductor canal block   Pre-Anesthetic Checklist: , timeout performed,  Correct Patient, Correct Site, Correct Laterality,  Correct Procedure, Correct Position, site marked,  Risks and benefits discussed,  Surgical consent,  Pre-op evaluation,  At surgeon's request and post-op pain management  Laterality: Lower and Left  Prep: chloraprep       Needles:  Injection technique: Single-shot  Needle Type: Echogenic Needle     Needle Length: 9cm  Needle Gauge: 22     Additional Needles:   Procedures:,,,, ultrasound used (permanent image in chart),,    Narrative:  Start time: 11/11/2020 6:48 AM End time: 11/11/2020 6:55 AM Injection made incrementally with aspirations every 5 mL.  Performed by: Personally  Anesthesiologist: Barnet Glasgow, MD  Additional Notes: Block assessed prior to surgery. Pt tolerated procedure well.

## 2020-11-11 NOTE — Progress Notes (Signed)
Patient has home CPAP set up at bedside.Patient states he does not need assistance from RT at this time. Family member at bedside.

## 2020-11-12 ENCOUNTER — Telehealth: Payer: Self-pay | Admitting: Radiology

## 2020-11-12 ENCOUNTER — Encounter (HOSPITAL_COMMUNITY): Payer: Self-pay | Admitting: Orthopaedic Surgery

## 2020-11-12 DIAGNOSIS — I10 Essential (primary) hypertension: Secondary | ICD-10-CM | POA: Diagnosis not present

## 2020-11-12 DIAGNOSIS — R531 Weakness: Secondary | ICD-10-CM | POA: Diagnosis not present

## 2020-11-12 DIAGNOSIS — G4733 Obstructive sleep apnea (adult) (pediatric): Secondary | ICD-10-CM | POA: Diagnosis not present

## 2020-11-12 DIAGNOSIS — M1712 Unilateral primary osteoarthritis, left knee: Secondary | ICD-10-CM | POA: Diagnosis not present

## 2020-11-12 LAB — BASIC METABOLIC PANEL
Anion gap: 9 (ref 5–15)
BUN: 10 mg/dL (ref 6–20)
CO2: 25 mmol/L (ref 22–32)
Calcium: 8.4 mg/dL — ABNORMAL LOW (ref 8.9–10.3)
Chloride: 99 mmol/L (ref 98–111)
Creatinine, Ser: 1.24 mg/dL (ref 0.61–1.24)
GFR, Estimated: >60 mL/min (ref >60)
Glucose, Bld: 159 mg/dL — ABNORMAL HIGH (ref 70–99)
Potassium: 3.8 mmol/L (ref 3.5–5.1)
Sodium: 133 mmol/L — ABNORMAL LOW (ref 135–145)

## 2020-11-12 LAB — CBC
HCT: 34.2 % — ABNORMAL LOW (ref 39.0–52.0)
Hemoglobin: 11 g/dL — ABNORMAL LOW (ref 13.0–17.0)
MCH: 27.7 pg (ref 26.0–34.0)
MCHC: 32.2 g/dL (ref 30.0–36.0)
MCV: 86.1 fL (ref 80.0–100.0)
Platelets: 248 10*3/uL (ref 150–400)
RBC: 3.97 MIL/uL — ABNORMAL LOW (ref 4.22–5.81)
RDW: 14 % (ref 11.5–15.5)
WBC: 10.9 10*3/uL — ABNORMAL HIGH (ref 4.0–10.5)
nRBC: 0 % (ref 0.0–0.2)

## 2020-11-12 MED ORDER — ASPIRIN EC 325 MG PO TBEC: 325.0000 mg | DELAYED_RELEASE_TABLET | Freq: Every day | ORAL | 0 refills | Status: DC

## 2020-11-12 MED ORDER — METHOCARBAMOL 500 MG PO TABS: 500.0000 mg | ORAL_TABLET | Freq: Four times a day (QID) | ORAL | 0 refills | Status: DC | PRN

## 2020-11-12 MED ORDER — PAROXETINE HCL 20 MG PO TABS: 20.0000 mg | ORAL_TABLET | Freq: Every day | ORAL | Status: DC

## 2020-11-12 MED ORDER — OXYCODONE-ACETAMINOPHEN 5-325 MG PO TABS: 1.0000 | ORAL_TABLET | ORAL | 0 refills | Status: DC | PRN

## 2020-11-12 NOTE — Progress Notes (Addendum)
Patient called pharmacy to see if he could fill his pain medication. Pharmacy said that insurance needed pre authorization before they could fill the prescription. Patient does not want to go home without pain medicine. Tired to call doctor but could not get him on the phone. Reported this to oncoming night nurse.

## 2020-11-12 NOTE — Telephone Encounter (Signed)
Received request for prior authorization for Oxycodone 5/[facility] from [facility].  Information entered on Cover My Meds  Corey [name] (Key: [id number]) - [id number] oxyCODONE-Acetaminophen 5-325MG  tablets     Status: PA Request  Created: June 14th, 2022 [phone]  Sent: June 14th, 2022  Awaiting response.

## 2020-11-12 NOTE — Progress Notes (Signed)
Physical Therapy Treatment Patient Details Name: Corey [name] MRN: [id number] DOB: [birth year] Today's Date: 11/12/2020    History of Present Illness Pt adm 6/13 for Lt TKR. PMH - osteoarthritis, HTN, back surgery.    PT Comments    Pt with slow progress with mobility. Quad set fair and pt with limited knee flexion. Will see again later today and try stairs with pt as he has a couple of stairs to enter his home.    Follow Up Recommendations  Follow surgeon's recommendation for DC plan and follow-up therapies     Equipment Recommendations  3in1 (PT)    Recommendations for Other Services       Precautions / Restrictions Precautions Precautions: Knee    Mobility  Bed Mobility Overal bed mobility: Needs Assistance Bed Mobility: Supine to Sit     Supine to sit: Min assist     General bed mobility comments: Assist to bring LLE off of bed    Transfers Overall transfer level: Needs assistance Equipment used: Rolling walker (2 wheeled) Transfers: Sit to/from Stand Sit to Stand: Min assist         General transfer comment: Assist for stability to rise. Verbal cues for hand placement  Ambulation/Gait Ambulation/Gait assistance: Min guard Gait Distance (Feet): 150 Feet Assistive device: Rolling walker (2 wheeled) Gait Pattern/deviations: Step-through pattern;Decreased stance time - left;Antalgic Gait velocity: decr Gait velocity interpretation: <1.31 ft/sec, indicative of household ambulator General Gait Details: Assist for safety. Pt with heavy reliance on UE's   Stairs             Wheelchair Mobility    Modified Rankin (Stroke Patients Only)       Balance Overall balance assessment: No apparent balance deficits (not formally assessed)                                          Cognition Arousal/Alertness: Awake/alert Behavior During Therapy: WFL for tasks assessed/performed Overall Cognitive Status: Within Functional Limits for  tasks assessed                                        Exercises Total Joint Exercises Ankle Circles/Pumps: AROM;Left;10 reps;Supine Quad Sets: Left;Supine;AROM;10 reps Heel Slides: AAROM;Left;10 reps;Supine Straight Leg Raises: AAROM;Left;10 reps;Supine Knee Flexion: AAROM;Left;5 reps;Seated Goniometric ROM: 0-60    General Comments        Pertinent Vitals/Pain Pain Assessment: 0-10 Pain Score: 7  Pain Location: lt knee Pain Descriptors / Indicators: Grimacing;Guarding;Aching Pain Intervention(s): Monitored during session;Limited activity within patient's tolerance;Premedicated before session    Home Living                      Prior Function            PT Goals (current goals can now be found in the care plan section) Acute Rehab PT Goals Patient Stated Goal: return home Progress towards PT goals: Progressing toward goals    Frequency    7X/week      PT Plan Current plan remains appropriate    Co-evaluation              AM-PAC PT "6 Clicks" Mobility   Outcome Measure  Help needed turning from your back to your side while in a flat bed without using bedrails?:  A Little Help needed moving from lying on your back to sitting on the side of a flat bed without using bedrails?: A Little Help needed moving to and from a bed to a chair (including a wheelchair)?: A Little Help needed standing up from a chair using your arms (e.g., wheelchair or bedside chair)?: A Little Help needed to walk in hospital room?: A Little Help needed climbing 3-5 steps with a railing? : A Little 6 Click Score: 18    End of Session Equipment Utilized During Treatment: Gait belt Activity Tolerance: Patient limited by pain Patient left: in chair;with call bell/phone within reach Nurse Communication: Mobility status PT Visit Diagnosis: Other abnormalities of gait and mobility (R26.89);Pain Pain - Right/Left: Left Pain - part of body: Knee     Time:  0901-0929 PT Time Calculation (min) (ACUTE ONLY): 28 min  Charges:  $Gait Training: 8-22 mins $Therapeutic Exercise: 8-22 mins                     [place] Pager [phone] Office [place] 11/12/2020, 9:38 AM

## 2020-11-12 NOTE — TOC Transition Note (Signed)
Transition of Care [facility]) - CM/SW Discharge Note   Patient Details  Name: MARKEZ DOWLAND MRN: [id number] Date of Birth: [birth year]  [facility] CM/SW Contact:  Milinda Antis, [place] Phone Number: 11/12/2020, 5:25 PM   Clinical Narrative:     Patient will DC to: home with home health Anticipated DC date: 11/12/2020    Per MD patient ready for DC to home with home health.  CSW spoke with the patient and verified address, phone number, and PCP.  The patient reported that he has [employer] and does not have an issue with affording medications.  CSW inquired about agency choice for DME and HH.  The patient reported that he did not have an agency choice, just whoever could provide the service.  The patient reported that he has a rolling walker and will only need a 3n1.  CSW spoke with Ramond Marrow at [facility] and they have agreed to provide [facility] services.  CSW spoke with Mardene Celeste at [place] and they will be providing the DME.   CSW called and informed patient of the above information.  The equipment was being delivered as CSW was speaking with the patient.    CSW will sign off for now as social work intervention is no longer needed. Please consult [place] again if new needs arise.    Final next level of care: [place] Barriers to Discharge: No Barriers Identified   Patient Goals and CMS Choice     Choice offered to / list presented to : Patient  Discharge Placement                       Discharge Plan and Services                DME Arranged: 3-N-1 DME Agency: [employer] Date DME Agency Contacted: 11/12/20 Time DME Agency Contacted: [id number] Representative spoke with at DME Agency: [place]: PT [place]: [place] ([place]) Date [place]: 11/12/20 Time [place]: [place] Representative spoke with at [place]: [place] ([place]) Interventions     Readmission Risk  Interventions No flowsheet data found.

## 2020-11-12 NOTE — Progress Notes (Signed)
Physical Therapy Treatment Patient Details Name: Corey [name] MRN: [id number] DOB: [birth year] Today's Date: 11/12/2020    History of Present Illness Pt adm 6/13 for Lt TKR. PMH - osteoarthritis, HTN, back surgery.    PT Comments    Pt making steady progress. Still requiring supervision to min guard assist for mobility. Reports significant other will be able to assist. Pt with limited knee flexion. Placed back in CPM when returned to bed.    Follow Up Recommendations  Follow surgeon's recommendation for DC plan and follow-up therapies     Equipment Recommendations  3in1 (PT)    Recommendations for Other Services       Precautions / Restrictions Precautions Precautions: Knee    Mobility  Bed Mobility Overal bed mobility: Needs Assistance Bed Mobility: Sit to Supine       Sit to supine: Supervision   General bed mobility comments: Verbal cues to use RLE to assist LLE back up into the bed    Transfers Overall transfer level: Needs assistance Equipment used: Rolling walker (2 wheeled) Transfers: Sit to/from Stand Sit to Stand: Min guard         General transfer comment: Verbal cues for hand placement  Ambulation/Gait Ambulation/Gait assistance: Supervision Gait Distance (Feet): 200 Feet Assistive device: Rolling walker (2 wheeled) Gait Pattern/deviations: Step-through pattern;Decreased stance time - left;Antalgic Gait velocity: decr Gait velocity interpretation: <1.31 ft/sec, indicative of household ambulator General Gait Details: Assist for safety.   Stairs Stairs: Yes Stairs assistance: +2 safety/equipment;Min assist Stair Management: With walker;Step to pattern;Backwards;Forwards Number of Stairs: 2 (2 up and down x 2) General stair comments: Assisted up/down frontwards with walker on first trial. On second trial assisted up backwards and down forwards. Pt felt better going up backwards. Verbal cues for technique. Handout given.   Wheelchair  Mobility    Modified Rankin (Stroke Patients Only)       Balance Overall balance assessment: No apparent balance deficits (not formally assessed)                                          Cognition Arousal/Alertness: Awake/alert Behavior During Therapy: WFL for tasks assessed/performed Overall Cognitive Status: Within Functional Limits for tasks assessed                                        Exercises      General Comments        Pertinent Vitals/Pain Pain Assessment: Faces Faces Pain Scale: Hurts even more Pain Location: lt knee Pain Descriptors / Indicators: Grimacing;Guarding;Aching Pain Intervention(s): Monitored during session;Limited activity within patient's tolerance;Repositioned    Home Living                      Prior Function            PT Goals (current goals can now be found in the care plan section) Acute Rehab PT Goals Patient Stated Goal: return home Progress towards PT goals: Progressing toward goals    Frequency    7X/week      PT Plan Current plan remains appropriate    Co-evaluation              AM-PAC PT "6 Clicks" Mobility   Outcome Measure  Help needed turning from your back  to your side while in a flat bed without using bedrails?: A Little Help needed moving from lying on your back to sitting on the side of a flat bed without using bedrails?: A Little Help needed moving to and from a bed to a chair (including a wheelchair)?: A Little Help needed standing up from a chair using your arms (e.g., wheelchair or bedside chair)?: A Little Help needed to walk in hospital room?: A Little Help needed climbing 3-5 steps with a railing? : A Little 6 Click Score: 18    End of Session Equipment Utilized During Treatment: Gait belt Activity Tolerance: Patient limited by pain Patient left: in chair;with call bell/phone within reach Nurse Communication: Mobility status PT Visit Diagnosis:  Other abnormalities of gait and mobility (R26.89);Pain Pain - Right/Left: Left Pain - part of body: Knee     Time: 1415-1450 PT Time Calculation (min) (ACUTE ONLY): 35 min  Charges:  $Gait Training: 23-37 mins                     [place] Pager [phone] Office [place] 11/12/2020, 3:07 PM

## 2020-11-12 NOTE — Progress Notes (Addendum)
Subjective: 1 Day Post-Op Procedure(s) (LRB): LEFT TOTAL KNEE ARTHROPLASTY (Left) Patient reports pain as moderate and severe.  Patient reports pain is 10 out of 10.  He is listening to radio and watching TV and conversant.  We discussed the pain scale.  He states he had to take 1 pain pill and states maybe his pain is only a 7.  He has been out of bed to the chair walked in the room.  Objective: Vital signs in last 24 hours: Temp:  [97 F (36.1 C)-99.2 F (37.3 C)] 98.5 F (36.9 C) (06/14 0522) Pulse Rate:  [40-112] 107 (06/14 0523) Resp:  [9-22] 16 (06/14 0523) BP: (77-146)/(50-91) 146/75 (06/14 0522) SpO2:  [98 %-100 %] 98 % (06/14 0522)  Intake/Output from previous day: 06/13 0701 - 06/14 0700 In: 2218.5 [I.V.:1219.8; IV Piggyback:998.7] Out: 1600 [Urine:1550; Blood:50] Intake/Output this shift: No intake/output data recorded.  Recent Labs    11/12/20 0251  HGB 11.0*   Recent Labs    11/12/20 0251  WBC 10.9*  RBC 3.97*  HCT 34.2*  PLT 248   Recent Labs    11/12/20 0251  NA 133*  K 3.8  CL 99  CO2 25  BUN 10  CREATININE 1.24  GLUCOSE 159*  CALCIUM 8.4*   No results for input(s): LABPT, INR in the last 72 hours.  Neurologically intact   Assessment/Plan: 1 Day Post-Op Procedure(s) (LRB): LEFT TOTAL KNEE ARTHROPLASTY (Left) Up with therapy, we will see how he does today with therapy hopefully might be able to be discharged this evening.  Physical therapy this AM.    Patient's anticipated LOS is less than 2 midnights, meeting these requirements: - Younger than 31 - Lives within 1 hour of care - Has a competent adult at home to recover with post-op recover - NO history of  - Chronic pain requiring opiods  - Diabetes  - Coronary Artery Disease  - Heart failure  - Heart attack  - Stroke  - DVT/VTE  - Cardiac arrhythmia  - Respiratory Failure/COPD  - Renal failure  - Anemia  - Advanced Liver disease     Marybelle Killings 11/12/2020, 7:34 AM

## 2020-11-12 NOTE — Progress Notes (Signed)
Pharmacy pain medication was pre-authorized this afternoon and discharge was filled out for patient to be discharged this afternoon. I have confirmed that Rx is available and at the pharmacy by the PharmD at his pharmacy and can pick it up anytime .  Patient was not discharged.    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  Likely hospital will not be reimbursed for procedure with procedure and dx not matching. Hospital Epic team will have to fix tomorrow.  I discussed pain medication with pt by phone and he can get it in AM. I did not discussed someone had improperly placed admission diagnosis.

## 2020-11-12 NOTE — Progress Notes (Signed)
Patient has home CPAP at bedside. No assistance needed from RT at this time.

## 2020-11-13 ENCOUNTER — Other Ambulatory Visit (HOSPITAL_COMMUNITY): Payer: Self-pay

## 2020-11-13 DIAGNOSIS — I10 Essential (primary) hypertension: Secondary | ICD-10-CM | POA: Diagnosis not present

## 2020-11-13 DIAGNOSIS — M1712 Unilateral primary osteoarthritis, left knee: Secondary | ICD-10-CM | POA: Diagnosis not present

## 2020-11-13 LAB — CBC
HCT: 33.4 % — ABNORMAL LOW (ref 39.0–52.0)
Hemoglobin: 10.8 g/dL — ABNORMAL LOW (ref 13.0–17.0)
MCH: 27.6 pg (ref 26.0–34.0)
MCHC: 32.3 g/dL (ref 30.0–36.0)
MCV: 85.2 fL (ref 80.0–100.0)
Platelets: 232 10*3/uL (ref 150–400)
RBC: 3.92 MIL/uL — ABNORMAL LOW (ref 4.22–5.81)
RDW: 14 % (ref 11.5–15.5)
WBC: 11.1 10*3/uL — ABNORMAL HIGH (ref 4.0–10.5)
nRBC: 0 % (ref 0.0–0.2)

## 2020-11-13 NOTE — Progress Notes (Signed)
Physical Therapy Treatment Patient Details Name: Corey [name] MRN: [id number] DOB: 14-Jan-1966 Today's Date: 11/13/2020    History of Present Illness Pt adm 6/13 for Lt TKR. PMH - osteoarthritis, HTN, back surgery.    PT Comments    Pt was seen for mobility today and was able to walk a hallway length as well as review exercises.  Pt is still struggling with getting LLE postured to sit with his flexion limitation, and will recommend he continue on as MD orders for follow up of therapy and assistance.  Has apparently been home with HHPT to follow him, to use ice and to complete his HEP as instructed.  Follow with acute PT goals as stay requires.  Follow Up Recommendations  Follow surgeon's recommendation for DC plan and follow-up therapies     Equipment Recommendations  3in1 (PT)    Recommendations for Other Services Rehab consult     Precautions / Restrictions Precautions Precautions: Knee Precaution Booklet Issued: Yes (comment) Restrictions Weight Bearing Restrictions: Yes Other Position/Activity Restrictions: WBAT RLE    Mobility  Bed Mobility               General bed mobility comments: up in chair when PT arrives    Transfers Overall transfer level: Needs assistance Equipment used: Rolling walker (2 wheeled) Transfers: Sit to/from Stand Sit to Stand: Min assist         General transfer comment: reminded of sequence including hand placement and posture of L knee both to sit and stand  Ambulation/Gait Ambulation/Gait assistance: Supervision Gait Distance (Feet): 150 Feet Assistive device: Rolling walker (2 wheeled) Gait Pattern/deviations: Step-through pattern;Decreased stance time - left Gait velocity: decr Gait velocity interpretation: <1.31 ft/sec, indicative of household ambulator General Gait Details: Pt was assisted to walk on the hall with one standing rest, and then back to room   Stairs             Wheelchair Mobility    Modified  Rankin (Stroke Patients Only)       Balance Overall balance assessment: Needs assistance Sitting-balance support: Feet supported Sitting balance-Leahy Scale: Good     Standing balance support: Bilateral upper extremity supported;During functional activity Standing balance-Leahy Scale: Fair                              Cognition Arousal/Alertness: Awake/alert Behavior During Therapy: WFL for tasks assessed/performed Overall Cognitive Status: Within Functional Limits for tasks assessed                                        Exercises Total Joint Exercises Ankle Circles/Pumps: AROM;Left Quad Sets: AROM;Both;10 reps Heel Slides: AAROM;10 reps Hip ABduction/ADduction: AAROM;10 reps Straight Leg Raises: AAROM;10 reps Goniometric ROM: 0/66    General Comments General comments (skin integrity, edema, etc.): pt was assisted to get to the chair from walk, able to assist with ex's and with safety of gait in choosing limits      Pertinent Vitals/Pain Pain Assessment: Faces Faces Pain Scale: Hurts even more Pain Location: L knee Pain Descriptors / Indicators: Guarding;Grimacing Pain Intervention(s): Limited activity within patient's tolerance;Monitored during session;Premedicated before session;Repositioned    Home Living                      Prior Function  PT Goals (current goals can now be found in the care plan section) Acute Rehab PT Goals Patient Stated Goal: return home Progress towards PT goals: Progressing toward goals    Frequency    7X/week      PT Plan Current plan remains appropriate    Co-evaluation              AM-PAC PT "6 Clicks" Mobility   Outcome Measure  Help needed turning from your back to your side while in a flat bed without using bedrails?: A Little Help needed moving from lying on your back to sitting on the side of a flat bed without using bedrails?: A Little Help needed moving to  and from a bed to a chair (including a wheelchair)?: A Little       6 Click Score: 9    End of Session Equipment Utilized During Treatment: Gait belt Activity Tolerance: Patient limited by pain Patient left: in chair;with call bell/phone within reach Nurse Communication: Mobility status PT Visit Diagnosis: Other abnormalities of gait and mobility (R26.89);Pain Pain - Right/Left: Left Pain - part of body: Knee     Time: 6789-3810 PT Time Calculation (min) (ACUTE ONLY): 28 min  Charges:  $Gait Training: 8-22 mins $Therapeutic Exercise: 8-22 mins   Ramond Dial 11/13/2020, 3:47 PM  Mee Hives, PT MS Acute Rehab Dept. Number: [place]

## 2020-11-13 NOTE — Progress Notes (Addendum)
Patient ID: Corey [name], male   DOB: 07/08/1965, 55 y.o.   MRN: [id number] Patient's postop medications were sent to [facility]  He is not getting his medications from the transition of care pharmacy or from the main pharmacy in the hospital since  if he ever needed a renewal he would have to come back to the hospital rather than use the local pharmacy that he always uses..  Reviewing the AVS ,it list the pharmacy and where the medications are present and I have specifically told the patient his [facility] are [facility] and I last night confirmed with the pharmacist  that they are ready.  Patient is ready for discharge as he was last night.

## 2020-11-13 NOTE — Telephone Encounter (Signed)
I received denial for medication through Cover My Meds. I called and spoke with Nunzio Cory at [employer] [phone].  She states that it was denied due to patient not having prior 7 day fill of Oxycodone/Acet.  She also explained that the medication has been "paid out" due to #50 still being within the limits that they require. Insurance approved the initial rx, however, a prior Josem Kaufmann will have to be done if patient requires refill. Per Nunzio Cory, on this prior auth, question regarding prior 7 day fill of Oxycodone/Acet will be answered "yes" and authorization should be approved.

## 2020-11-13 NOTE — Discharge Planning (Signed)
Discharge instructions given to patient. Patient verbalizes understanding. No complaints at the current time. Awaiting pick up.

## 2020-11-13 NOTE — Progress Notes (Signed)
Subjective: 2 Days Post-Op Procedure(s) (LRB): LEFT TOTAL KNEE ARTHROPLASTY (Left) Patient reports pain as mild.    Objective: Vital signs in last 24 hours: Temp:  [98.4 F (36.9 C)-99.6 F (37.6 C)] 98.4 F (36.9 C) (06/15 0600) Pulse Rate:  [90-100] 94 (06/15 0600) Resp:  [14-17] 14 (06/15 0600) BP: (146-152)/(69-92) 146/92 (06/15 0600) SpO2:  [97 %-100 %] 97 % (06/15 0600)  Intake/Output from previous day: 06/14 0701 - 06/15 0700 In: 480 [P.O.:480] Out: 250 [Urine:250] Intake/Output this shift: No intake/output data recorded.  Recent Labs    11/12/20 0251 11/13/20 0458  HGB 11.0* 10.8*   Recent Labs    11/12/20 0251 11/13/20 0458  WBC 10.9* 11.1*  RBC 3.97* 3.92*  HCT 34.2* 33.4*  PLT 248 232   Recent Labs    11/12/20 0251  NA 133*  K 3.8  CL 99  CO2 25  BUN 10  CREATININE 1.24  GLUCOSE 159*  CALCIUM 8.4*   No results for input(s): LABPT, INR in the last 72 hours.  Neurologically intact   Assessment/Plan: 2 Days Post-Op Procedure(s) (LRB): LEFT TOTAL KNEE ARTHROPLASTY (Left) Discharge home with home health    Patient's anticipated LOS is less than 2 midnights, meeting these requirements: - Younger than 26 - Lives within 1 hour of care - Has a competent adult at home to recover with post-op recover - NO history of  - Chronic pain requiring opiods  - Diabetes  - Coronary Artery Disease  - Heart failure  - Heart attack  - Stroke  - DVT/VTE  - Cardiac arrhythmia  - Respiratory Failure/COPD  - Renal failure  - Anemia  - Advanced Liver disease     Corey [name] 11/13/2020, 7:42 AM

## 2020-11-14 ENCOUNTER — Telehealth: Payer: Self-pay

## 2020-11-14 DIAGNOSIS — Z9181 History of falling: Secondary | ICD-10-CM | POA: Diagnosis not present

## 2020-11-14 DIAGNOSIS — Z471 Aftercare following joint replacement surgery: Secondary | ICD-10-CM | POA: Diagnosis not present

## 2020-11-14 DIAGNOSIS — E785 Hyperlipidemia, unspecified: Secondary | ICD-10-CM | POA: Diagnosis not present

## 2020-11-14 DIAGNOSIS — Z6833 Body mass index (BMI) 33.0-33.9, adult: Secondary | ICD-10-CM | POA: Diagnosis not present

## 2020-11-14 DIAGNOSIS — Z96652 Presence of left artificial knee joint: Secondary | ICD-10-CM | POA: Diagnosis not present

## 2020-11-14 DIAGNOSIS — Z7982 Long term (current) use of aspirin: Secondary | ICD-10-CM | POA: Diagnosis not present

## 2020-11-14 DIAGNOSIS — Z79891 Long term (current) use of opiate analgesic: Secondary | ICD-10-CM | POA: Diagnosis not present

## 2020-11-14 DIAGNOSIS — G4733 Obstructive sleep apnea (adult) (pediatric): Secondary | ICD-10-CM | POA: Diagnosis not present

## 2020-11-14 DIAGNOSIS — E669 Obesity, unspecified: Secondary | ICD-10-CM | POA: Diagnosis not present

## 2020-11-14 DIAGNOSIS — I10 Essential (primary) hypertension: Secondary | ICD-10-CM | POA: Diagnosis not present

## 2020-11-14 NOTE — Telephone Encounter (Signed)
Ok for orders? 

## 2020-11-14 NOTE — Telephone Encounter (Signed)
Home health physical therapy called requesting verbal orders for pt  2 times a week for 1 week 3 times a week for 1 week  2 times a week for 1 week   Surgical dressing got wet in the shower today. Cecile Hearing took it off and he has a dry dressing she stated everything looks fine

## 2020-11-15 NOTE — Telephone Encounter (Signed)
I left voicemail for [place] advising.

## 2020-11-16 DIAGNOSIS — Z471 Aftercare following joint replacement surgery: Secondary | ICD-10-CM | POA: Diagnosis not present

## 2020-11-16 DIAGNOSIS — G4733 Obstructive sleep apnea (adult) (pediatric): Secondary | ICD-10-CM | POA: Diagnosis not present

## 2020-11-16 DIAGNOSIS — E669 Obesity, unspecified: Secondary | ICD-10-CM | POA: Diagnosis not present

## 2020-11-16 DIAGNOSIS — Z6833 Body mass index (BMI) 33.0-33.9, adult: Secondary | ICD-10-CM | POA: Diagnosis not present

## 2020-11-16 DIAGNOSIS — I10 Essential (primary) hypertension: Secondary | ICD-10-CM | POA: Diagnosis not present

## 2020-11-16 DIAGNOSIS — Z9181 History of falling: Secondary | ICD-10-CM | POA: Diagnosis not present

## 2020-11-16 DIAGNOSIS — E785 Hyperlipidemia, unspecified: Secondary | ICD-10-CM | POA: Diagnosis not present

## 2020-11-16 DIAGNOSIS — Z79891 Long term (current) use of opiate analgesic: Secondary | ICD-10-CM | POA: Diagnosis not present

## 2020-11-16 DIAGNOSIS — Z7982 Long term (current) use of aspirin: Secondary | ICD-10-CM | POA: Diagnosis not present

## 2020-11-16 DIAGNOSIS — Z96652 Presence of left artificial knee joint: Secondary | ICD-10-CM | POA: Diagnosis not present

## 2020-11-18 DIAGNOSIS — G4733 Obstructive sleep apnea (adult) (pediatric): Secondary | ICD-10-CM | POA: Diagnosis not present

## 2020-11-18 DIAGNOSIS — Z9181 History of falling: Secondary | ICD-10-CM | POA: Diagnosis not present

## 2020-11-18 DIAGNOSIS — E669 Obesity, unspecified: Secondary | ICD-10-CM | POA: Diagnosis not present

## 2020-11-18 DIAGNOSIS — Z96652 Presence of left artificial knee joint: Secondary | ICD-10-CM | POA: Diagnosis not present

## 2020-11-18 DIAGNOSIS — I10 Essential (primary) hypertension: Secondary | ICD-10-CM | POA: Diagnosis not present

## 2020-11-18 DIAGNOSIS — Z7982 Long term (current) use of aspirin: Secondary | ICD-10-CM | POA: Diagnosis not present

## 2020-11-18 DIAGNOSIS — E785 Hyperlipidemia, unspecified: Secondary | ICD-10-CM | POA: Diagnosis not present

## 2020-11-18 DIAGNOSIS — Z6833 Body mass index (BMI) 33.0-33.9, adult: Secondary | ICD-10-CM | POA: Diagnosis not present

## 2020-11-18 DIAGNOSIS — Z79891 Long term (current) use of opiate analgesic: Secondary | ICD-10-CM | POA: Diagnosis not present

## 2020-11-18 DIAGNOSIS — Z471 Aftercare following joint replacement surgery: Secondary | ICD-10-CM | POA: Diagnosis not present

## 2020-11-18 NOTE — Discharge Summary (Signed)
Patient ID: Corey [name] MRN: [id number] DOB/AGE: June 23, 1965 55 y.o.  Admit date: 11/11/2020 Discharge date: 6/15/022  Admission Diagnoses:  Active Problems:   H/O total knee replacement, left   Discharge Diagnoses:  Active Problems:   H/O total knee replacement, left  status post Procedure(s): LEFT TOTAL KNEE ARTHROPLASTY  Past Medical History:  Diagnosis Date   Arthritis    knee, left   ED (erectile dysfunction)    Hyperlipidemia    Hypertension    Sleep apnea    CPAP - not using lately due to recall     Surgeries: Procedure(s): LEFT TOTAL KNEE ARTHROPLASTY on 11/11/2020   Consultants:   Discharged Condition: Improved  Hospital Course: Corey [name] is an 55 y.o. male who was admitted 11/11/2020 for operative treatment of left knee djd. Patient failed conservative treatments (please see the history and physical for the specifics) and had severe unremitting pain that affects sleep, daily activities and work/hobbies. After pre-op clearance, the patient was taken to the operating room on 11/11/2020 and underwent  Procedure(s): LEFT TOTAL KNEE ARTHROPLASTY.    Patient was given perioperative antibiotics:  Anti-infectives (From admission, onward)    Start     Dose/Rate Route Frequency Ordered Stop   11/11/20 0600  vancomycin (VANCOCIN) IVPB 1000 mg/200 mL premix        1,000 mg 200 mL/hr over 60 Minutes Intravenous On call to O.R. 11/11/20 0543 11/11/20 0800        Patient was given sequential compression devices and early ambulation to prevent DVT.   Patient benefited maximally from hospital stay and there were no complications. At the time of discharge, the patient was urinating/moving their bowels without difficulty, tolerating a regular diet, pain is controlled with oral pain medications and they have been cleared by PT/OT.   Recent vital signs: No data found.   Recent laboratory studies: No results for input(s): WBC, HGB, HCT, PLT, NA, K, CL, CO2, BUN,  CREATININE, GLUCOSE, INR, CALCIUM in the last 72 hours.  Invalid input(s): PT, 2   Discharge Medications:   Allergies as of 11/13/2020       Reactions   Penicillins Other (See Comments)   Makes pt black out        Medication List     STOP taking these medications    diclofenac Sodium 1 % Gel Commonly known as: VOLTAREN   OVER THE COUNTER MEDICATION       TAKE these medications    aspirin EC 325 MG tablet Take 1 tablet (325 mg total) by mouth daily. MUST TAKE AT LEAST 4 WEEKS POSTOP FOR DVT PROPHYLAXIS   aspirin EC 325 MG tablet Take 1 tablet (325 mg total) by mouth daily. Must take at least 4 weeks postop for DVT prophylaxis   methocarbamol 500 MG tablet Commonly known as: Robaxin Take 1 tablet (500 mg total) by mouth every 6 (six) hours as needed for muscle spasms.   methocarbamol 500 MG tablet Commonly known as: Robaxin Take 1 tablet (500 mg total) by mouth every 6 (six) hours as needed for muscle spasms.   multivitamin with minerals tablet Take 2 tablets by mouth daily.   oxyCODONE-acetaminophen 5-325 MG tablet Commonly known as: PERCOCET/ROXICET Take 1 tablet by mouth every 4 (four) hours as needed for severe pain.   PARoxetine 20 MG tablet Commonly known as: PAXIL Take 1 tablet (20 mg total) by mouth daily. TAKE 1 TABLET BY MOUTH EVERY DAY IN THE MORNING  Diagnostic Studies: DG Knee Left Port  Result Date: 11/11/2020 CLINICAL DATA:  Status post left knee replacement. EXAM: PORTABLE LEFT KNEE - 1-2 VIEW COMPARISON:  Plain films left knee 10/16/2020. FINDINGS: New left total knee arthroplasty is in place. No hardware complication or acute bony abnormality is seen. Surgical staples noted. IMPRESSION: Status post left knee replacement.  No acute abnormality. Electronically Signed   By: Inge Rise M.D.   On: 11/11/2020 10:40       Follow-up Information     Marybelle Killings, MD Follow up in 1 week(s).   Specialty: Orthopedic Surgery Contact  information: [place] [address] [place], [place] [facility] Follow up.   Why: [employer] will be providing the durable medical equipment.  Contact [employer] at the number above with any questions regarding equipment. Contact information: [address] [place] [address] [place], [place] [facility] Follow up.   Why: [facility] will be providing Home health services.  The agency will contact you within 48 hours of discharge to schedule. Contact information: [place] [address] [phone]                 Discharge Plan:  discharge to home  Disposition:     Signed: Benjiman Core  11/18/2020, 4:56 PM

## 2020-11-20 DIAGNOSIS — E785 Hyperlipidemia, unspecified: Secondary | ICD-10-CM | POA: Diagnosis not present

## 2020-11-20 DIAGNOSIS — Z6833 Body mass index (BMI) 33.0-33.9, adult: Secondary | ICD-10-CM | POA: Diagnosis not present

## 2020-11-20 DIAGNOSIS — Z79891 Long term (current) use of opiate analgesic: Secondary | ICD-10-CM | POA: Diagnosis not present

## 2020-11-20 DIAGNOSIS — Z7982 Long term (current) use of aspirin: Secondary | ICD-10-CM | POA: Diagnosis not present

## 2020-11-20 DIAGNOSIS — Z96652 Presence of left artificial knee joint: Secondary | ICD-10-CM | POA: Diagnosis not present

## 2020-11-20 DIAGNOSIS — G4733 Obstructive sleep apnea (adult) (pediatric): Secondary | ICD-10-CM | POA: Diagnosis not present

## 2020-11-20 DIAGNOSIS — Z471 Aftercare following joint replacement surgery: Secondary | ICD-10-CM | POA: Diagnosis not present

## 2020-11-20 DIAGNOSIS — Z9181 History of falling: Secondary | ICD-10-CM | POA: Diagnosis not present

## 2020-11-20 DIAGNOSIS — E669 Obesity, unspecified: Secondary | ICD-10-CM | POA: Diagnosis not present

## 2020-11-20 DIAGNOSIS — I10 Essential (primary) hypertension: Secondary | ICD-10-CM | POA: Diagnosis not present

## 2020-11-21 ENCOUNTER — Ambulatory Visit (INDEPENDENT_AMBULATORY_CARE_PROVIDER_SITE_OTHER): Payer: Federal, State, Local not specified - PPO | Admitting: Surgery

## 2020-11-21 ENCOUNTER — Encounter: Payer: Self-pay | Admitting: Surgery

## 2020-11-21 ENCOUNTER — Other Ambulatory Visit: Payer: Self-pay

## 2020-11-21 DIAGNOSIS — Z96652 Presence of left artificial knee joint: Secondary | ICD-10-CM

## 2020-11-22 ENCOUNTER — Encounter: Payer: Self-pay | Admitting: Surgery

## 2020-11-22 DIAGNOSIS — G4733 Obstructive sleep apnea (adult) (pediatric): Secondary | ICD-10-CM | POA: Diagnosis not present

## 2020-11-22 DIAGNOSIS — E785 Hyperlipidemia, unspecified: Secondary | ICD-10-CM | POA: Diagnosis not present

## 2020-11-22 DIAGNOSIS — I10 Essential (primary) hypertension: Secondary | ICD-10-CM | POA: Diagnosis not present

## 2020-11-22 DIAGNOSIS — Z79891 Long term (current) use of opiate analgesic: Secondary | ICD-10-CM | POA: Diagnosis not present

## 2020-11-22 DIAGNOSIS — Z471 Aftercare following joint replacement surgery: Secondary | ICD-10-CM | POA: Diagnosis not present

## 2020-11-22 DIAGNOSIS — E669 Obesity, unspecified: Secondary | ICD-10-CM | POA: Diagnosis not present

## 2020-11-22 DIAGNOSIS — Z96652 Presence of left artificial knee joint: Secondary | ICD-10-CM | POA: Diagnosis not present

## 2020-11-22 DIAGNOSIS — Z6833 Body mass index (BMI) 33.0-33.9, adult: Secondary | ICD-10-CM | POA: Diagnosis not present

## 2020-11-22 DIAGNOSIS — Z9181 History of falling: Secondary | ICD-10-CM | POA: Diagnosis not present

## 2020-11-22 DIAGNOSIS — Z7982 Long term (current) use of aspirin: Secondary | ICD-10-CM | POA: Diagnosis not present

## 2020-11-22 NOTE — Progress Notes (Signed)
55 year old black male who is 10 days status post left total knee replacement returns.  States that he is doing well.  Has swelling in the knee which she feels is making it harder for him to bend.  Continues work with home health PT.  Does not need refill of medications.   Exam Very pleasant black male alert and oriented in no acute distress.  Knee range of motion about 0 to 75 degrees.  Does have knee swelling.  No drainage or signs of infection.  Staples intact.  Calf nontender.   Plan I gave patient a prescription to get TED hose at [facility].  Advised him to elevate his foot is much as possible to help decrease pain and swelling.  Stressed him the importance of working on his knee flexion.  If he is slow moving we did discuss the possibility of manipulation when he is around 8 to 10 weeks postop.

## 2020-11-25 DIAGNOSIS — Z471 Aftercare following joint replacement surgery: Secondary | ICD-10-CM | POA: Diagnosis not present

## 2020-11-25 DIAGNOSIS — Z9181 History of falling: Secondary | ICD-10-CM | POA: Diagnosis not present

## 2020-11-25 DIAGNOSIS — Z79891 Long term (current) use of opiate analgesic: Secondary | ICD-10-CM | POA: Diagnosis not present

## 2020-11-25 DIAGNOSIS — I10 Essential (primary) hypertension: Secondary | ICD-10-CM | POA: Diagnosis not present

## 2020-11-25 DIAGNOSIS — Z7982 Long term (current) use of aspirin: Secondary | ICD-10-CM | POA: Diagnosis not present

## 2020-11-25 DIAGNOSIS — G4733 Obstructive sleep apnea (adult) (pediatric): Secondary | ICD-10-CM | POA: Diagnosis not present

## 2020-11-25 DIAGNOSIS — Z96652 Presence of left artificial knee joint: Secondary | ICD-10-CM | POA: Diagnosis not present

## 2020-11-25 DIAGNOSIS — E785 Hyperlipidemia, unspecified: Secondary | ICD-10-CM | POA: Diagnosis not present

## 2020-11-25 DIAGNOSIS — Z6833 Body mass index (BMI) 33.0-33.9, adult: Secondary | ICD-10-CM | POA: Diagnosis not present

## 2020-11-25 DIAGNOSIS — E669 Obesity, unspecified: Secondary | ICD-10-CM | POA: Diagnosis not present

## 2020-11-26 ENCOUNTER — Encounter: Payer: Self-pay | Admitting: Family Medicine

## 2020-11-28 ENCOUNTER — Ambulatory Visit (INDEPENDENT_AMBULATORY_CARE_PROVIDER_SITE_OTHER): Payer: Federal, State, Local not specified - PPO | Admitting: Surgery

## 2020-11-28 ENCOUNTER — Encounter: Payer: Self-pay | Admitting: Surgery

## 2020-11-28 ENCOUNTER — Telehealth: Payer: Self-pay | Admitting: Orthopaedic Surgery

## 2020-11-28 VITALS — BP 133/88 | HR 98 | Ht 69.0 in | Wt 225.0 lb

## 2020-11-28 DIAGNOSIS — E785 Hyperlipidemia, unspecified: Secondary | ICD-10-CM | POA: Diagnosis not present

## 2020-11-28 DIAGNOSIS — G4733 Obstructive sleep apnea (adult) (pediatric): Secondary | ICD-10-CM | POA: Diagnosis not present

## 2020-11-28 DIAGNOSIS — Z6833 Body mass index (BMI) 33.0-33.9, adult: Secondary | ICD-10-CM | POA: Diagnosis not present

## 2020-11-28 DIAGNOSIS — Z79891 Long term (current) use of opiate analgesic: Secondary | ICD-10-CM | POA: Diagnosis not present

## 2020-11-28 DIAGNOSIS — Z96652 Presence of left artificial knee joint: Secondary | ICD-10-CM | POA: Diagnosis not present

## 2020-11-28 DIAGNOSIS — I10 Essential (primary) hypertension: Secondary | ICD-10-CM | POA: Diagnosis not present

## 2020-11-28 DIAGNOSIS — E669 Obesity, unspecified: Secondary | ICD-10-CM | POA: Diagnosis not present

## 2020-11-28 DIAGNOSIS — Z7982 Long term (current) use of aspirin: Secondary | ICD-10-CM | POA: Diagnosis not present

## 2020-11-28 DIAGNOSIS — Z471 Aftercare following joint replacement surgery: Secondary | ICD-10-CM | POA: Diagnosis not present

## 2020-11-28 DIAGNOSIS — Z9181 History of falling: Secondary | ICD-10-CM | POA: Diagnosis not present

## 2020-11-28 NOTE — Telephone Encounter (Signed)
FYI  James-sending to you as patient is following up with you this afternoon.

## 2020-11-28 NOTE — Telephone Encounter (Signed)
Corey [name] with home health PT called just wanting to make Dr. Lorin Mercy aware the pt has been discharged and is clear to start out therapy.   If any questionsRachel Moulds CB# [phone]

## 2020-11-29 NOTE — Progress Notes (Signed)
54 year old black male who is 2-week status post left total knee replacement returns for wound check and staple removal.  States that his knee is doing well.  He finished home health PT and is ready to start outpatient therapy.  States that he has been driving and did so to the clinic today.  Exam Pleasant black male alert and oriented in no acute distress.  Wound looks good.  Staples removed and Steri-Strips applied.  Incision line well without signs infection.  Range of motion about 5 to 70 degrees.  Calf is nontender.  Plan Did referral for outpatient therapy here in our office.  I stressed to patient the importance of working hard with physical therapy and home exercise program.  I do have some concern and where his range of motion is that currently.  I advised him that if he is moving slow at 8 to 10 weeks and not making any progress that it may come down to him needing left knee manipulation under anesthesia.  He will follow-up in 4 weeks with Dr. Lorin Mercy for recheck.  I advised patient that he absolutely should not be driving until he is cleared possibly at 6 weeks postop.  Patient is also taking narcotic medication and I advised that he could be charged for DUI/DWI.  States that he now voices understanding.  Patient's wife also present today for this visit.

## 2020-12-03 ENCOUNTER — Ambulatory Visit: Payer: Federal, State, Local not specified - PPO | Admitting: Physical Therapy

## 2020-12-03 ENCOUNTER — Other Ambulatory Visit: Payer: Self-pay

## 2020-12-03 ENCOUNTER — Encounter: Payer: Self-pay | Admitting: Physical Therapy

## 2020-12-03 DIAGNOSIS — M25562 Pain in left knee: Secondary | ICD-10-CM | POA: Diagnosis not present

## 2020-12-03 DIAGNOSIS — M25662 Stiffness of left knee, not elsewhere classified: Secondary | ICD-10-CM | POA: Diagnosis not present

## 2020-12-03 DIAGNOSIS — R262 Difficulty in walking, not elsewhere classified: Secondary | ICD-10-CM | POA: Diagnosis not present

## 2020-12-03 DIAGNOSIS — R600 Localized edema: Secondary | ICD-10-CM | POA: Diagnosis not present

## 2020-12-03 NOTE — Patient Instructions (Signed)
Access Code: [email or web address] URL: [email or web address] Date: 12/03/2020 Prepared by: Kearney Hard  Exercises Sit to Stand - 3-4 x daily - 7 x weekly - 2 sets - 10 reps Supine Active Straight Leg Raise - 3-4 x daily - 7 x weekly - 2 sets - 10 reps Supine Heel Slide with Strap - 3-4 x daily - 7 x weekly - 2 sets - 10 reps Seated Knee Flexion AAROM - 3-4 x daily - 7 x weekly - 2 sets - 10 reps - 5-10 second hold Prone Knee Extension Hang - 3-4 x daily - 7 x weekly - 5 minutes, progressing to 10 minutes hold

## 2020-12-03 NOTE — Therapy (Signed)
[facility] Therapy [address] [place], [place], [address] Phone: [phone]   Fax:  [phone]  Physical Therapy Treatment  Patient Details  Name: Corey [name] MRN: [id number] Date of Birth: September 18, 1965 Referring Provider (PT): Benjiman Core, PA-C   Encounter Date: 12/03/2020   PT End of Session - 12/03/20 1029     Visit Number 1    Number of Visits 24    Date for PT Re-Evaluation 02/28/21    Authorization Type [employer]    Progress Note Due on Visit 10    PT Start Time 1018    PT Stop Time 1058    PT Time Calculation (min) 40 min    Activity Tolerance Patient limited by pain    Behavior During Therapy [facility] for tasks assessed/performed             Past Medical History:  Diagnosis Date   Arthritis    knee, left   ED (erectile dysfunction)    Hyperlipidemia    Hypertension    Sleep apnea    CPAP - not using lately due to recall     Past Surgical History:  Procedure Laterality Date   ANKLE [place] ARTHROSCOPY Left 2006   LUMBAR MICRODISCECTOMY     POLYPECTOMY     TOTAL KNEE ARTHROPLASTY Left 11/11/2020   Procedure: LEFT TOTAL KNEE ARTHROPLASTY;  Surgeon: Marybelle Killings, MD;  Location: [facility];  Service: Orthopedics;  Laterality: Left;   UMBILICAL HERNIA REPAIR      There were no vitals filed for this visit.   Subjective Assessment - 12/03/20 1024     Subjective Pt arriving s/p left TKA on 11/11/2020. Pt arriving today reporting 3/10 pain in left knee. Pt wearing thigh high compression stocking. Pt amb with straight cane with antalgic gait.    Pertinent History back surgery, HTN, OA,    Limitations Walking;House hold activities;Lifting    Diagnostic tests X-ray    Patient Stated Goals I want to get back to 100%, work out again    Currently in Pain? Yes    Pain Score 3     Pain Location Knee    Pain Orientation Left     Pain Descriptors / Indicators Aching;Sore;Tightness    Pain Type Surgical pain;Acute pain    Pain Onset 1 to 4 weeks ago    Pain Frequency Constant    Aggravating Factors  bending, sleeping    Pain Relieving Factors resting, propping leg, ice, pain meds    Effect of Pain on Daily Activities difficulty sleeping, difficulty walking and household chorese and ADL's.                [facility] PT Assessment - 12/03/20 0001       Assessment   Medical Diagnosis Z96.652 left TKA    Referring Provider (PT) Benjiman Core, PA-C    Onset Date/Surgical Date 11/11/20    Hand Dominance Left    Prior Therapy HHPT      Precautions   Precautions None      Restrictions   Weight Bearing Restrictions No      Balance Screen   Has the patient fallen in the past 6 months No    Is the patient reluctant to leave their home because of a fear of  falling?  No      Home Environment   Living Environment Private residence    Living Arrangements Alone    Type of [place] to enter    [place] Two level      Prior Function   Level of Independence Independent    Vocation Full time employment    Vocation Requirements [employer]    Leisure work out, read      Cognition   Overall Cognitive Status Within Functional Limits for tasks assessed      Observation/Other Assessments   Focus on Therapeutic Outcomes (FOTO)  33 (predicted 55)      Posture/Postural Control   Posture/Postural Control Postural limitations    Postural Limitations Rounded Shoulders;Forward head      ROM / Strength   AROM / PROM / Strength AROM;PROM;Strength      AROM   Overall AROM  Deficits    AROM Assessment Site Knee    Right/Left Knee Right;Left    Right Knee Extension 0    Right Knee Flexion 130    Left Knee Extension -10    Left Knee Flexion 65      PROM   PROM Assessment Site Knee    Right/Left Knee Left    Left Knee Extension -8    Left Knee Flexion 70      Strength   Overall Strength  Comments bilateral hips grossly 5/5    Strength Assessment Site Knee    Right/Left Knee Right;Left    Right Knee Flexion 5/5    Right Knee Extension 5/5    Left Knee Flexion 4-/5    Left Knee Extension 3/5      Palpation   Palpation comment TTP: lateral knee numbness noted, tenderness along medial and lateral joint lines      Transfers   Five time sit to stand comments  19 seconds UE support      Ambulation/Gait   Gait Comments amb with st cane with antalgic gait with decreased knee flexion in left LE, decreased heel strike and decreased full weight shifting to left                           [facility] Adult PT Treatment/Exercise - 12/03/20 0001       Exercises   Exercises Knee/Hip      Knee/Hip Exercises: Seated   Other Seated Knee/Hip Exercises tailgait position AAROM using right LE to push knee into flexion x 5 holding 5-10 seconds      Knee/Hip Exercises: Supine   Quad Sets Strengthening;Left;10 reps    Heel Slides AROM;Left;10 reps    Bridges Both;5 reps    Straight Leg Raises Strengthening;Left;5 reps    Straight Leg Raises Limitations extensor lag noted      Modalities   Modalities Vasopneumatic      Vasopneumatic   Number Minutes Vasopneumatic  10 minutes    Vasopnuematic Location  Knee    Vasopneumatic Pressure Low    Vasopneumatic Temperature  34                    PT Education - 12/03/20 1028     Education Details PT POC, HEP    Person(s) Educated Patient    Methods Explanation;Demonstration;Handout;Verbal cues;Tactile cues    Comprehension Verbalized understanding;Returned demonstration              PT Short Term Goals -  12/03/20 1031       PT SHORT TERM GOAL #1   Title Pt will be independent in his initial HEP.    Time 4    Period Weeks    Status New    Target Date 01/03/21      PT SHORT TERM GOAL #2   Title pt will improve his 5 time sit to stand to </= 14 seconds with no UE support    Time 4    Period Weeks     Status New    Target Date 01/03/21               PT Long Term Goals - 12/03/20 1259       PT LONG TERM GOAL #1   Title Pt will be independent in his advanced HEP.    Time 12    Period Weeks    Status New    Target Date 02/28/21      PT LONG TERM GOAL #2   Title Pt will be able to amb community surfaces with no device safely for >/= 1000 feet.    Time 12    Period Weeks    Status New    Target Date 02/28/21      PT LONG TERM GOAL #3   Title Pt will improve his left LE strength to >/= 4+/5 to improve function    Time 12    Period Weeks    Status New    Target Date 02/28/21      PT LONG TERM GOAL #4   Title Pt will improve his left knee ROM 0-120 degrees actively.    Time 12    Period Weeks    Status New    Target Date 02/28/21      PT LONG TERM GOAL #5   Title Pt will be able to navigate 1 flight of steps with single hand rail with pain </= 2/10.    Time 12    Period Weeks    Status New    Target Date 02/28/21      Additional Long Term Goals   Additional Long Term Goals Yes      PT LONG TERM GOAL #6   Title Pt will improve his FOTO to >/= 55.    Time 12    Period Weeks    Status New    Target Date 02/28/21                   Plan - 12/03/20 1424     Clinical Impression Statement Pt presenting today s/p left TKA on 11/11/2020. Pt reporting 3/10 left knee pain and stiffness. Pt stated he received HHPT and has been doing his exercises several times daily. PT was instructed in new HEP and instructed to ice and elevate to help with swelling. Pt wearing thigh high compression stocking.  Pt still presenting with limited AROM and PROM and weakness noted. Pt with extensor lag with left straight leg raise. Skilled PT needed to address pt's impairments with below interventions.    Personal Factors and Comorbidities Comorbidity 3+    Comorbidities back surgery, HTN, OA    Examination-Activity Limitations Lift;Transfers;Stairs;Stand;Squat     Examination-Participation Restrictions Other;Community Activity    Stability/Clinical Decision Making Stable/Uncomplicated    Clinical Decision Making Low    Rehab Potential Excellent    PT Frequency 2x / week    PT Duration 12 weeks    PT Treatment/Interventions ADLs/Self Care Home Management;Cryotherapy;Electrical Stimulation;Gait  training;Stair training;Functional mobility training;Therapeutic activities;Therapeutic exercise;Balance training;Neuromuscular re-education;Patient/family education;Manual techniques;Passive range of motion;Taping;Vasopneumatic Device    PT Next Visit Plan bike vs Nustep, Leg Press, funcitonal squats/sit to stand, LE ROM/strengthening    PT Home Exercise Plan Access Code: [email or web address]    [email or web address] and Agree with Plan of Care Patient             Patient will benefit from skilled therapeutic intervention in order to improve the following deficits and impairments:  Pain, Difficulty walking, Decreased range of motion, Decreased mobility, Decreased strength, Increased edema, Decreased balance, Impaired flexibility, Decreased activity tolerance  Visit Diagnosis: Acute pain of left knee  Stiffness of left knee, not elsewhere classified  Difficulty in walking, not elsewhere classified  Localized edema     Problem List Patient Active Problem List   Diagnosis Date Noted   H/O total knee replacement, left 11/11/2020   Leiomyoma 11/08/2020   Unilateral primary osteoarthritis, left knee 10/17/2020   Arthritis 09/26/2018   Onychomycosis 09/26/2018   Class 1 obesity due to excess calories without serious comorbidity with body mass index (BMI) of 32.0 to 32.9 in adult 09/06/2017   Hyperlipidemia LDL goal <100 08/31/2016   OSA on CPAP 08/31/2016   Premature ejaculation 08/19/2011    Oretha Caprice, PT, MPT 12/03/2020, 2:40 PM  [facility] [address] [place], [place], [address] Phone: [phone]   Fax:   [phone]  Name: Corey [name] MRN: [id number] Date of Birth: 1965/10/31

## 2020-12-05 ENCOUNTER — Other Ambulatory Visit: Payer: Self-pay | Admitting: Surgery

## 2020-12-05 NOTE — Telephone Encounter (Signed)
Please advise 

## 2020-12-06 ENCOUNTER — Telehealth: Payer: Self-pay | Admitting: Orthopaedic Surgery

## 2020-12-06 ENCOUNTER — Other Ambulatory Visit: Payer: Self-pay | Admitting: Orthopaedic Surgery

## 2020-12-06 MED ORDER — TRAMADOL HCL 50 MG PO TABS: 50.0000 mg | ORAL_TABLET | Freq: Four times a day (QID) | ORAL | 0 refills | Status: DC | PRN

## 2020-12-06 NOTE — Telephone Encounter (Signed)
Pt called asking for a refill of his percocet 5-325 mg rx; and he stated if Dr.Yates can't send that in, anything for pain will be fine. Pt would also like a CB when something has been called in.   [phone]

## 2020-12-06 NOTE — Telephone Encounter (Signed)
Please advise 

## 2020-12-09 DIAGNOSIS — G4733 Obstructive sleep apnea (adult) (pediatric): Secondary | ICD-10-CM | POA: Diagnosis not present

## 2020-12-11 ENCOUNTER — Encounter: Payer: Self-pay | Admitting: Rehabilitative and Restorative Service Providers"

## 2020-12-11 ENCOUNTER — Other Ambulatory Visit: Payer: Self-pay

## 2020-12-11 ENCOUNTER — Ambulatory Visit: Payer: Federal, State, Local not specified - PPO | Admitting: Rehabilitative and Restorative Service Providers"

## 2020-12-11 DIAGNOSIS — R262 Difficulty in walking, not elsewhere classified: Secondary | ICD-10-CM

## 2020-12-11 DIAGNOSIS — M6281 Muscle weakness (generalized): Secondary | ICD-10-CM | POA: Diagnosis not present

## 2020-12-11 DIAGNOSIS — R600 Localized edema: Secondary | ICD-10-CM | POA: Diagnosis not present

## 2020-12-11 DIAGNOSIS — M25662 Stiffness of left knee, not elsewhere classified: Secondary | ICD-10-CM

## 2020-12-11 NOTE — Therapy (Signed)
[facility] [address] [place], [place], [address] Phone: [phone]   Fax:  [phone]  Physical Therapy Treatment  Patient Details  Name: Corey [name] MRN: [id number] Date of Birth: [birth year] Referring Provider (PT): Corey Core, PA-C   Encounter Date: 12/11/2020   PT End of Session - 12/11/20 1417     Visit Number 2    Number of Visits 24    Date for PT Re-Evaluation 02/28/21    Authorization Type BCBS    Progress Note Due on Visit 10    PT Start Time 1100    PT Stop Time 1149    PT Time Calculation (min) 49 min    Activity Tolerance Patient limited by pain;Patient tolerated treatment well    Behavior During Therapy WFL for tasks assessed/performed             Past Medical History:  Diagnosis Date   Arthritis    knee, left   ED (erectile dysfunction)    Hyperlipidemia    Hypertension    Sleep apnea    CPAP - not using lately due to recall     Past Surgical History:  Procedure Laterality Date   ANKLE BONE SURGERY     BACK SURGERY     COLLAR BONE SURGEY     COLONOSCOPY     FRACTURE SURGERY     HERNIA REPAIR     KNEE ARTHROSCOPY Left 2006   LUMBAR MICRODISCECTOMY     POLYPECTOMY     TOTAL KNEE ARTHROPLASTY Left 11/11/2020   Procedure: LEFT TOTAL KNEE ARTHROPLASTY;  Surgeon: Corey Killings, MD;  Location: [place];  Service: Orthopedics;  Laterality: Left;   UMBILICAL HERNIA REPAIR      There were no vitals filed for this visit.   Subjective Assessment - 12/11/20 1113     Subjective Corey [name] is using Tramadol PRN.  He is sleeping about 2 hours uninterrupted before his knee wakes him up.  He reports good early HEP compliance.    Pertinent History back surgery, HTN, OA,    Limitations Walking;House hold activities;Lifting    How long can you stand comfortably? 30 minutes    Diagnostic tests X-ray    Patient Stated Goals I want to get back to 100%, work out again    Currently in Pain? Yes    Pain Score 3     Pain  Location Knee    Pain Orientation Left    Pain Descriptors / Indicators Aching;Sore;Tightness    Pain Type Surgical pain;Chronic pain    Pain Radiating Towards NA    Pain Onset More than a month ago    Pain Frequency Intermittent    Aggravating Factors  Limited knee AROM.  Sleep and stairs are difficult.    Pain Relieving Factors Exercises and Tramadol PRN.    Effect of Pain on Daily Activities Affects sleep and WB function.  AROM limited.    Multiple Pain Sites No                [facility] PT Assessment - 12/11/20 0001       ROM / Strength   AROM / PROM / Strength AROM      AROM   Overall AROM  Deficits    AROM Assessment Site Knee    Right/Left Knee Left    Left Knee Extension -10    Left Knee Flexion 73  [place] Adult PT Treatment/Exercise - 12/11/20 0001       Exercises   Exercises Knee/Hip      Knee/Hip Exercises: Aerobic   Recumbent Bike Seat 8 for 8 minutes AAROM      Knee/Hip Exercises: Machines for Strengthening   Total Gym Leg Press 75# L leg only 25X full extension to as much flexion as possible      Knee/Hip Exercises: Seated   Long Arc Quad --    Long Arc [employer] Limitations --    Other Seated Knee/Hip Exercises Knee flexion AAROM (R pushes L into flexion) 10X 10 seconds and tailgate knee flexion 3 minutes      Knee/Hip Exercises: Supine   Quad Sets Strengthening;Left;10 reps    Quad Sets Limitations 2 sets 5 second hold (toes back, press knee down, tighten thigh)    Heel Slides AROM;Left;10 reps    Heel Slides Limitations 10 seconds with a belt      Modalities   Modalities Vasopneumatic      Vasopneumatic   Number Minutes Vasopneumatic  10 minutes    Vasopnuematic Location  Knee    Vasopneumatic Pressure Low    Vasopneumatic Temperature  34                    PT Education - 12/11/20 1416     Education Details Review HEP with emphasis on AROM (particularly extension).    Person(s) Educated  Patient    Methods Explanation;Demonstration;Tactile cues;Verbal cues    Comprehension Verbalized understanding;Tactile cues required;Returned demonstration;Need further instruction;Verbal cues required              PT Short Term Goals - 12/11/20 1416       PT SHORT TERM GOAL #1   Title Pt will be independent in his initial HEP.    Time 4    Period Weeks    Status On-going    Target Date 01/03/21      PT SHORT TERM GOAL #2   Title pt will improve his 5 time sit to stand to </= 14 seconds with no UE support    Time 4    Period Weeks    Status On-going    Target Date 01/03/21               PT Long Term Goals - 12/03/20 1259       PT LONG TERM GOAL #1   Title Pt will be independent in his advanced HEP.    Time 12    Period Weeks    Status New    Target Date 02/28/21      PT LONG TERM GOAL #2   Title Pt will be able to amb community surfaces with no device safely for >/= 1000 feet.    Time 12    Period Weeks    Status New    Target Date 02/28/21      PT LONG TERM GOAL #3   Title Pt will improve his left LE strength to >/= 4+/5 to improve function    Time 12    Period Weeks    Status New    Target Date 02/28/21      PT LONG TERM GOAL #4   Title Pt will improve his left knee ROM 0-120 degrees actively.    Time 12    Period Weeks    Status New    Target Date 02/28/21      PT LONG TERM GOAL #5  Title Pt will be able to navigate 1 flight of steps with single hand rail with pain </= 2/10.    Time 12    Period Weeks    Status New    Target Date 02/28/21      Additional Long Term Goals   Additional Long Term Goals Yes      PT LONG TERM GOAL #6   Title Pt will improve his FOTO to >/= 55.    Time 12    Period Weeks    Status New    Target Date 02/28/21                   Plan - 12/11/20 1417     Clinical Impression Statement Less is giving excellent effort with his post-surgical PT.  We had a discussion on the importance of getting  AROM early, particularly extension.  Recommended at least 100 quadriceps sets/day along with his current flexion AROM and quadriceps strength activities.  Continue AROM, strength and edema control work to meet LTGs.    Personal Factors and Comorbidities Comorbidity 3+    Comorbidities back surgery, HTN, OA    Examination-Activity Limitations Lift;Transfers;Stairs;Stand;Squat    Examination-Participation Restrictions Other;Community Activity    Stability/Clinical Decision Making Stable/Uncomplicated    Rehab Potential Excellent    PT Frequency 2x / week    PT Duration 12 weeks    PT Treatment/Interventions ADLs/Self Care Home Management;Cryotherapy;Electrical Stimulation;Gait training;Stair training;Functional mobility training;Therapeutic activities;Therapeutic exercise;Balance training;Neuromuscular re-education;Patient/family education;Manual techniques;Passive range of motion;Taping;Vasopneumatic Device    PT Next Visit Plan bike vs Nustep, Leg Press, funcitonal squats/sit to stand, LE ROM/strengthening    PT Home Exercise Plan Access Code: [email or web address]    [email or web address] and Agree with Plan of Care Patient             Patient will benefit from skilled therapeutic intervention in order to improve the following deficits and impairments:  Pain, Difficulty walking, Decreased range of motion, Decreased mobility, Decreased strength, Increased edema, Decreased balance, Impaired flexibility, Decreased activity tolerance  Visit Diagnosis: Difficulty walking  Muscle weakness (generalized)  Localized edema  Stiffness of left knee, not elsewhere classified     Problem List Patient Active Problem List   Diagnosis Date Noted   H/O total knee replacement, left 11/11/2020   Leiomyoma 11/08/2020   Unilateral primary osteoarthritis, left knee 10/17/2020   Arthritis 09/26/2018   Onychomycosis 09/26/2018   Class 1 obesity due to excess calories without serious comorbidity with body mass index (BMI)  of 32.0 to 32.9 in adult 09/06/2017   Hyperlipidemia LDL goal <100 08/31/2016   OSA on CPAP 08/31/2016   Premature ejaculation 08/19/2011    Farley Ly PT, MPT 12/11/2020, 2:20 PM  [facility] Therapy [address] [place], [place], [address] Phone: [phone]   Fax:  [phone]  Name: Corey [name] MRN: [id number] Date of Birth: [birth year]

## 2020-12-11 NOTE — Patient Instructions (Signed)
Increase quadriceps sets to at least 100/day to reduce edema, improve extension AROM and strength.

## 2020-12-17 ENCOUNTER — Encounter: Payer: Self-pay | Admitting: Physical Therapy

## 2020-12-17 ENCOUNTER — Ambulatory Visit: Payer: Federal, State, Local not specified - PPO | Admitting: Physical Therapy

## 2020-12-17 ENCOUNTER — Other Ambulatory Visit: Payer: Self-pay

## 2020-12-17 DIAGNOSIS — R600 Localized edema: Secondary | ICD-10-CM

## 2020-12-17 DIAGNOSIS — R262 Difficulty in walking, not elsewhere classified: Secondary | ICD-10-CM | POA: Diagnosis not present

## 2020-12-17 DIAGNOSIS — M6281 Muscle weakness (generalized): Secondary | ICD-10-CM | POA: Diagnosis not present

## 2020-12-17 DIAGNOSIS — M25562 Pain in left knee: Secondary | ICD-10-CM

## 2020-12-17 DIAGNOSIS — M25662 Stiffness of left knee, not elsewhere classified: Secondary | ICD-10-CM | POA: Diagnosis not present

## 2020-12-17 NOTE — Therapy (Signed)
[facility] [address] [place], [place], [address] Phone: [phone]   Fax:  [phone]  Physical Therapy Treatment  Patient Details  Name: Corey [name] MRN: [id number] Date of Birth: [birth year] Referring Provider (PT): Benjiman Core, PA-C   Encounter Date: 12/17/2020   PT End of Session - 12/17/20 0852     Visit Number 3    Number of Visits 24    Date for PT Re-Evaluation 02/28/21    Authorization Type BCBS    Progress Note Due on Visit 10    PT Start Time 0840    PT Stop Time 0925    PT Time Calculation (min) 45 min    Activity Tolerance Patient limited by pain;Patient tolerated treatment well    Behavior During Therapy WFL for tasks assessed/performed             Past Medical History:  Diagnosis Date   Arthritis    knee, left   ED (erectile dysfunction)    Hyperlipidemia    Hypertension    Sleep apnea    CPAP - not using lately due to recall     Past Surgical History:  Procedure Laterality Date   ANKLE BONE SURGERY     BACK SURGERY     COLLAR BONE SURGEY     COLONOSCOPY     FRACTURE SURGERY     HERNIA REPAIR     KNEE ARTHROSCOPY Left 2006   LUMBAR MICRODISCECTOMY     POLYPECTOMY     TOTAL KNEE ARTHROPLASTY Left 11/11/2020   Procedure: LEFT TOTAL KNEE ARTHROPLASTY;  Surgeon: Marybelle Killings, MD;  Location: [place];  Service: Orthopedics;  Laterality: Left;   UMBILICAL HERNIA REPAIR      There were no vitals filed for this visit.   Subjective Assessment - 12/17/20 0850     Subjective Pt arriving today reporting 4/10 pain in his left knee. Pt reporting compliance in his HEP. Pt still reporting not sleeping well due to pain and unable to get comfortable.    Pertinent History back surgery, HTN, OA,    How long can you stand comfortably? 30 minutes    Diagnostic tests X-ray    Patient Stated Goals I want to get back to 100%, work out again    Currently in Pain? Yes    Pain Score 4     Pain Location Knee    Pain  Orientation Left    Pain Descriptors / Indicators Aching;Sore;Tightness    Pain Type Surgical pain    Pain Onset More than a month ago    Pain Frequency Constant                [facility] PT Assessment - 12/17/20 0001       Assessment   Medical Diagnosis Z96.652 left TKA    Referring Provider (PT) Benjiman Core, PA-C    Onset Date/Surgical Date 11/11/20      ROM / Strength   AROM / PROM / Strength AROM      AROM   Overall AROM  Deficits    Overall AROM Comments in supine    AROM Assessment Site Knee    Right/Left Knee Left    Left Knee Extension -12    Left Knee Flexion 80      PROM   Overall PROM Comments in supine    PROM Assessment Site Knee    Right/Left Knee Left    Left Knee Extension -10    Left Knee  Flexion 85                           [facility] Adult PT Treatment/Exercise - 12/17/20 0001       Exercises   Exercises Knee/Hip      Knee/Hip Exercises: Aerobic   Recumbent Bike seat 8 x 8 minutes, rocking back and forth unable to complete full revolutions      Knee/Hip Exercises: Machines for Strengthening   Total Gym Leg Press 75# L LE only 3x10      Knee/Hip Exercises: Standing   Other Standing Knee Exercises TRX squats 2x10      Knee/Hip Exercises: Seated   Long Arc Quad Strengthening;Left;2 sets;10 reps;Weights    Long Arc Quad Weight 3 lbs.      Knee/Hip Exercises: Supine   Quad Sets Strengthening;Left;10 reps    Quad Sets Limitations 2 sets holding 5 seconds    Heel Slides AROM;Left;10 reps    Heel Slides Limitations 10 seconds with a belt    Straight Leg Raises Strengthening;Left;2 sets;10 reps      Modalities   Modalities Vasopneumatic      Vasopneumatic   Number Minutes Vasopneumatic  10 minutes    Vasopnuematic Location  Knee    Vasopneumatic Pressure Low    Vasopneumatic Temperature  34      Manual Therapy   Manual therapy comments Contract/Relax x 5, PROM knee flexion/extension                      PT  Short Term Goals - 12/17/20 0855       PT SHORT TERM GOAL #1   Title Pt will be independent in his initial HEP.    Status On-going      PT SHORT TERM GOAL #2   Title pt will improve his 5 time sit to stand to </= 14 seconds with no UE support    Status On-going               PT Long Term Goals - 12/03/20 1259       PT LONG TERM GOAL #1   Title Pt will be independent in his advanced HEP.    Time 12    Period Weeks    Status New    Target Date 02/28/21      PT LONG TERM GOAL #2   Title Pt will be able to amb community surfaces with no device safely for >/= 1000 feet.    Time 12    Period Weeks    Status New    Target Date 02/28/21      PT LONG TERM GOAL #3   Title Pt will improve his left LE strength to >/= 4+/5 to improve function    Time 12    Period Weeks    Status New    Target Date 02/28/21      PT LONG TERM GOAL #4   Title Pt will improve his left knee ROM 0-120 degrees actively.    Time 12    Period Weeks    Status New    Target Date 02/28/21      PT LONG TERM GOAL #5   Title Pt will be able to navigate 1 flight of steps with single hand rail with pain </= 2/10.    Time 12    Period Weeks    Status New    Target Date 02/28/21  Additional Long Term Goals   Additional Long Term Goals Yes      PT LONG TERM GOAL #6   Title Pt will improve his FOTO to >/= 55.    Time 12    Period Weeks    Status New    Target Date 02/28/21                   Plan - 12/17/20 0853     Clinical Impression Statement Pt arriving today reproting 4/10 pain in his left knee. Pt tolerating exercises well, increased pain with end range knee flexion. Focusing on ROM and quad strengthening. Pt's active left knee ROM today 12-80 degrees. Passive ROM measured 10-85 degrees. Pt able to perform left SLR without extensor lag noted when performing quad set prior to lift.  Continue skilled PT to maximize function.    Personal Factors and Comorbidities Comorbidity 3+     Comorbidities back surgery, HTN, OA    Examination-Activity Limitations Lift;Transfers;Stairs;Stand;Squat    Examination-Participation Restrictions Other;Community Activity    Stability/Clinical Decision Making Stable/Uncomplicated    Rehab Potential Excellent    PT Frequency 2x / week    PT Duration 12 weeks    PT Treatment/Interventions ADLs/Self Care Home Management;Cryotherapy;Electrical Stimulation;Gait training;Stair training;Functional mobility training;Therapeutic activities;Therapeutic exercise;Balance training;Neuromuscular re-education;Patient/family education;Manual techniques;Passive range of motion;Taping;Vasopneumatic Device    PT Next Visit Plan bike, Leg Press, funcitonal squats/sit to stand, LE ROM/strengthening    PT Home Exercise Plan Access Code: [email or web address]    [email or web address] and Agree with Plan of Care Patient             Patient will benefit from skilled therapeutic intervention in order to improve the following deficits and impairments:  Pain, Difficulty walking, Decreased range of motion, Decreased mobility, Decreased strength, Increased edema, Decreased balance, Impaired flexibility, Decreased activity tolerance  Visit Diagnosis: Difficulty walking  Stiffness of left knee, not elsewhere classified  Muscle weakness (generalized)  Acute pain of left knee  Difficulty in walking, not elsewhere classified  Localized edema     Problem List Patient Active Problem List   Diagnosis Date Noted   H/O total knee replacement, left 11/11/2020   Leiomyoma 11/08/2020   Unilateral primary osteoarthritis, left knee 10/17/2020   Arthritis 09/26/2018   Onychomycosis 09/26/2018   Class 1 obesity due to excess calories without serious comorbidity with body mass index (BMI) of 32.0 to 32.9 in adult 09/06/2017   Hyperlipidemia LDL goal <100 08/31/2016   OSA on CPAP 08/31/2016   Premature ejaculation 08/19/2011    Oretha Caprice, PT, MPT 12/17/2020, 9:23 AM  [facility] [address] [place], [place], [address] Phone: [phone]   Fax:  [phone]  Name: Corey [name] MRN: [id number] Date of Birth: 11-09-1965

## 2020-12-20 ENCOUNTER — Other Ambulatory Visit: Payer: Self-pay

## 2020-12-20 ENCOUNTER — Ambulatory Visit: Payer: Federal, State, Local not specified - PPO | Admitting: Rehabilitative and Restorative Service Providers"

## 2020-12-20 ENCOUNTER — Encounter: Payer: Self-pay | Admitting: Rehabilitative and Restorative Service Providers"

## 2020-12-20 DIAGNOSIS — M6281 Muscle weakness (generalized): Secondary | ICD-10-CM

## 2020-12-20 DIAGNOSIS — R262 Difficulty in walking, not elsewhere classified: Secondary | ICD-10-CM | POA: Diagnosis not present

## 2020-12-20 DIAGNOSIS — M25662 Stiffness of left knee, not elsewhere classified: Secondary | ICD-10-CM | POA: Diagnosis not present

## 2020-12-20 DIAGNOSIS — R600 Localized edema: Secondary | ICD-10-CM | POA: Diagnosis not present

## 2020-12-20 NOTE — Patient Instructions (Signed)
Access Code: [email or web address] URL: [email or web address] Date: 12/20/2020 Prepared by: Vista Mink  Exercises Sit to Stand - 3-4 x daily - 7 x weekly - 2 sets - 10 reps Supine Active Straight Leg Raise - 3-4 x daily - 7 x weekly - 2 sets - 10 reps Supine Heel Slide with Strap - 3-4 x daily - 7 x weekly - 2 sets - 10 reps Seated Knee Flexion AAROM - 3-4 x daily - 7 x weekly - 2 sets - 10 reps - 5-10 second hold Prone Knee Extension Hang - 3-4 x daily - 7 x weekly - 3-5 minutes, progressing to 10 minutes hold Supine Quadricep Sets - 2-3 x daily - 7 x weekly - 2-3 sets - 10 reps - 5 second hold

## 2020-12-20 NOTE — Therapy (Signed)
[facility] [address] [place], [place], [address] Phone: [phone]   Fax:  [phone]  Physical Therapy Treatment  Patient Details  Name: Corey [name] MRN: [id number] Date of Birth: [birth year] Referring Provider (PT): Corey Core, PA-C   Encounter Date: 12/20/2020   PT End of Session - 12/20/20 1116     Visit Number 4    Number of Visits 24    Date for PT Re-Evaluation 02/28/21    Authorization Type BCBS    Progress Note Due on Visit 10    PT Start Time 0805    PT Stop Time 0900    PT Time Calculation (min) 55 min    Activity Tolerance Patient tolerated treatment well    Behavior During Therapy [name]             Past Medical History:  Diagnosis Date   Arthritis    knee, left   ED (erectile dysfunction)    Hyperlipidemia    Hypertension    Sleep apnea    CPAP - not using lately due to recall     Past Surgical History:  Procedure Laterality Date   ANKLE [place] ARTHROSCOPY Left 2006   LUMBAR MICRODISCECTOMY     POLYPECTOMY     TOTAL KNEE ARTHROPLASTY Left 11/11/2020   Procedure: LEFT TOTAL KNEE ARTHROPLASTY;  Surgeon: Corey Killings, MD;  Location: [place];  Service: Orthopedics;  Laterality: Left;   UMBILICAL HERNIA REPAIR      There were no vitals filed for this visit.   Subjective Assessment - 12/20/20 0809     Subjective Corey [name] reports 1X/day HEP compliance.  I reminded him 3-5X/day HEP compliance is recommended.    Pertinent History back surgery, HTN, OA,    Limitations Walking;House hold activities;Lifting    How long can you stand comfortably? 1 hour (was 30 minutes)    Diagnostic tests X-ray    Patient Stated Goals I want to get back to 100%, work out again    Currently in Pain? Yes    Pain Score 3     Pain Location Knee    Pain Orientation Left    Pain  Descriptors / Indicators Aching;Sore;Tightness    Pain Type Surgical pain    Pain Radiating Towards NA    Pain Onset More than a month ago    Pain Frequency Constant    Aggravating Factors  Limited knee AROM.  Sleep is interrupted and stairs are difficult.    Pain Relieving Factors Exercises and Tramadol before bed.    Effect of Pain on Daily Activities Affects sleep and WB function.  AROM limited.    Multiple Pain Sites No                [facility] PT Assessment - 12/20/20 0001       ROM / Strength   AROM / PROM / Strength AROM      AROM   Overall AROM  Deficits    AROM Assessment Site Knee    Right/Left Knee Left    Left Knee Extension -10    Left Knee Flexion 80  [place] [facility] PT Treatment/Exercise - 12/20/20 0001       Exercises   Exercises Knee/Hip      Knee/Hip Exercises: Stretches   Other Knee/Hip Stretches Prone knee extension stretch with rolled up towels above knees and 1# weight on L heel 3 minutes    Other Knee/Hip Stretches Seated knee extension stretch with self-overpressure above knee joint 10X 10 seconds      Knee/Hip Exercises: Aerobic   Recumbent Bike Seat 8 for 8 minutes AAROM      Knee/Hip Exercises: Machines for Strengthening   Total Gym Leg Press 75# L leg only 25X full extension to as much flexion as possible      Knee/Hip Exercises: Seated   Other Seated Knee/Hip Exercises Knee flexion AAROM (R pushes L into flexion) 10X 10 seconds and tailgate knee flexion 3 minutes      Knee/Hip Exercises: Supine   Quad Sets Strengthening;Left;10 reps    Quad Sets Limitations 2 sets 5 seconds (toes back, press down and tighten thigh)    Heel Slides AROM;Left;10 reps    Heel Slides Limitations 10 seconds with a belt      Modalities   Modalities Vasopneumatic      Vasopneumatic   Number Minutes Vasopneumatic  10 minutes    Vasopnuematic Location  Knee    Vasopneumatic Pressure Medium    Vasopneumatic Temperature   34                    PT Education - 12/20/20 1114     Education Details Review HEP with emphasis on AROM (extension and flexion).  Encouraged 3-5X/day HEP compliance (currently 1X/day).    Person(s) Educated Patient    Methods Explanation;Tactile cues;Demonstration;Verbal cues;Handout    Comprehension Verbal cues required;Returned demonstration;Need further instruction;Tactile cues required;Verbalized understanding              PT Short Term Goals - 12/20/20 1115       PT SHORT TERM GOAL #1   Title Pt will be independent in his initial HEP.    Status Achieved      PT SHORT TERM GOAL #2   Title pt will improve his 5 time sit to stand to </= 14 seconds with no UE support    Status On-going               PT Long Term Goals - 12/20/20 1115       PT LONG TERM GOAL #1   Title Pt will be independent in his advanced HEP.    Time 12    Period Weeks    Status On-going    Target Date 02/28/21      PT LONG TERM GOAL #2   Title Pt will be able to amb community surfaces with no device safely for >/= 1000 feet.    Time 12    Period Weeks    Status Achieved      PT LONG TERM GOAL #3   Title Pt will improve his left LE strength to >/= 4+/5 to improve function    Time 12    Period Weeks    Status On-going    Target Date 02/28/21      PT LONG TERM GOAL #4   Title Pt will improve his left knee ROM 0-120 degrees actively.    Baseline -10 to 80 degrees on 12/20/2020    Time 12    Period Weeks    Status On-going    Target  Date 02/28/21      PT LONG TERM GOAL #5   Title Pt will be able to navigate 1 flight of steps with single hand rail with pain </= 2/10.    Time 12    Period Weeks    Status On-going    Target Date 02/28/21      PT LONG TERM GOAL #6   Title Pt will improve his FOTO to >/= 55.    Time 12    Period Weeks    Status On-going    Target Date 02/28/21                   Plan - 12/20/20 1117     Clinical Impression Statement  Corey [name] reports 1X/day HEP compliance.  I strongly recommended 3-5X/day HEP compliance due to his -10 to 80 degree L knee AROM.  He reports he will increase his HEP compliance.  AROM is a big early emphasis along with quadriceps strength.  Corey [name] still has time to meet LTGs in regards to AROM but will need to increase HEP compliance to get there along with consistent attendance in PT.    Personal Factors and Comorbidities Comorbidity 3+    Comorbidities back surgery, HTN, OA    Examination-Activity Limitations Lift;Transfers;Stairs;Stand;Squat    Examination-Participation Restrictions Other;Community Activity    Stability/Clinical Decision Making Stable/Uncomplicated    Rehab Potential Excellent    PT Frequency 2x / week    PT Duration 12 weeks    PT Treatment/Interventions ADLs/Self Care Home Management;Cryotherapy;Electrical Stimulation;Gait training;Stair training;Functional mobility training;Therapeutic activities;Therapeutic exercise;Balance training;Neuromuscular re-education;Patient/family education;Manual techniques;Passive range of motion;Taping;Vasopneumatic Device    PT Next Visit Plan AROM emphasis    PT Home Exercise Plan Access Code: [email or web address]    Consulted and Agree with Plan of Care Patient             Patient will benefit from skilled therapeutic intervention in order to improve the following deficits and impairments:  Pain, Difficulty walking, Decreased range of motion, Decreased mobility, Decreased strength, Increased edema, Decreased balance, Impaired flexibility, Decreased activity tolerance  Visit Diagnosis: Difficulty in walking, not elsewhere classified  Muscle weakness (generalized)  Localized edema  Stiffness of left knee, not elsewhere classified     Problem List Patient Active Problem List   Diagnosis Date Noted   H/O total knee replacement, left 11/11/2020   Leiomyoma 11/08/2020   Unilateral primary osteoarthritis, left knee 10/17/2020   Arthritis  09/26/2018   Onychomycosis 09/26/2018   Class 1 obesity due to excess calories without serious comorbidity with body mass index (BMI) of 32.0 to 32.9 in adult 09/06/2017   Hyperlipidemia LDL goal <100 08/31/2016   OSA on CPAP 08/31/2016   Premature ejaculation 08/19/2011    Farley Ly PT, MPT 12/20/2020, 11:21 AM  [place] [address] [place], [place], [address] Phone: [phone]   Fax:  [phone]  Name: Corey [name] MRN: [id number] Date of Birth: December 20, 1965

## 2020-12-24 ENCOUNTER — Encounter: Payer: Self-pay | Admitting: Physical Therapy

## 2020-12-24 ENCOUNTER — Other Ambulatory Visit: Payer: Self-pay

## 2020-12-24 ENCOUNTER — Ambulatory Visit: Payer: Federal, State, Local not specified - PPO | Admitting: Physical Therapy

## 2020-12-24 DIAGNOSIS — M25662 Stiffness of left knee, not elsewhere classified: Secondary | ICD-10-CM | POA: Diagnosis not present

## 2020-12-24 DIAGNOSIS — R262 Difficulty in walking, not elsewhere classified: Secondary | ICD-10-CM | POA: Diagnosis not present

## 2020-12-24 DIAGNOSIS — R600 Localized edema: Secondary | ICD-10-CM

## 2020-12-24 DIAGNOSIS — M6281 Muscle weakness (generalized): Secondary | ICD-10-CM | POA: Diagnosis not present

## 2020-12-24 DIAGNOSIS — M25562 Pain in left knee: Secondary | ICD-10-CM

## 2020-12-24 NOTE — Therapy (Signed)
[facility] [address] [place], [place], [address] Phone: [phone]   Fax:  [phone]  Physical Therapy Treatment  Patient Details  Name: CONSUELO BODKINS MRN: [id number] Date of Birth: 01/28/1966 Referring Provider (PT): Benjiman Core PA-C   Encounter Date: 12/24/2020   PT End of Session - 12/24/20 0912     Visit Number 5    Number of Visits 24    Date for PT Re-Evaluation 02/28/21    Authorization Type BCBS    Progress Note Due on Visit 10    PT Start Time 0845    PT Stop Time 0935    PT Time Calculation (min) 50 min    Activity Tolerance Patient tolerated treatment well    Behavior During Therapy [facility] for tasks assessed/performed             Past Medical History:  Diagnosis Date   Arthritis    knee, left   ED (erectile dysfunction)    Hyperlipidemia    Hypertension    Sleep apnea    CPAP - not using lately due to recall     Past Surgical History:  Procedure Laterality Date   ANKLE [place] ARTHROSCOPY Left 2006   LUMBAR MICRODISCECTOMY     POLYPECTOMY     TOTAL KNEE ARTHROPLASTY Left 11/11/2020   Procedure: LEFT TOTAL KNEE ARTHROPLASTY;  Surgeon: Marybelle Killings, MD;  Location: [place];  Service: Orthopedics;  Laterality: Left;   UMBILICAL HERNIA REPAIR      There were no vitals filed for this visit.   Subjective Assessment - 12/24/20 0911     Subjective Pt arriving to therapy reporting 4-5/10 pain in left knee.    Patient Stated Goals I want to get back to 100%, work out again    Currently in Pain? Yes    Pain Score 5     Pain Location Knee    Pain Orientation Left    Pain Descriptors / Indicators Aching;Sore;Tightness    Pain Type Surgical pain    Pain Onset More than a month ago                [facility] PT Assessment - 12/24/20 0001       Assessment   Medical Diagnosis Z96.652 Left TKA     Referring Provider (PT) Benjiman Core PA-C    Onset Date/Surgical Date 11/11/20      ROM / Strength   AROM / PROM / Strength AROM      AROM   Overall AROM  Deficits    AROM Assessment Site Knee    Right/Left Knee Left    Left Knee Extension -8    Left Knee Flexion 85      PROM   Overall PROM Comments in supine    PROM Assessment Site Knee    Right/Left Knee Left    Left Knee Extension -6    Left Knee Flexion 90                           [facility] Adult PT Treatment/Exercise - 12/24/20 0001       Exercises   Exercises Knee/Hip      Knee/Hip Exercises: Aerobic   Recumbent Bike UBE Beckey Rutter combo: seat at  13: pt able to make full revolutions after beginning with rocking back and forth x 8 minutes      Knee/Hip Exercises: Machines for Strengthening   Total Gym Leg Press bilateral LE's: 125# 3x10 Left LE only: 81# 3x10      Knee/Hip Exercises: Standing   Lateral Step Up Both;20 reps;Hand Hold: 0;Step Height: 6"    Forward Step Up 20 reps;Hand Hold: 1;Step Height: 6"    Other Standing Knee Exercises TRX squats 2x10      Knee/Hip Exercises: Seated   Long Arc Quad Strengthening;Left;2 sets;10 reps;Weights    Long Arc Quad Weight 4 lbs.    Other Seated Knee/Hip Exercises seated SLR 2x10      Modalities   Modalities Vasopneumatic      Vasopneumatic   Number Minutes Vasopneumatic  10 minutes    Vasopnuematic Location  Knee    Vasopneumatic Pressure Medium    Vasopneumatic Temperature  34      Manual Therapy   Manual therapy comments PROM flexion and extension                      PT Short Term Goals - 12/24/20 0933       PT SHORT TERM GOAL #1   Title Pt will be independent in his initial HEP.    Status Achieved      PT SHORT TERM GOAL #2   Title pt will improve his 5 time sit to stand to </= 14 seconds with no UE support    Status On-going               PT Long Term Goals - 12/24/20 0934       PT LONG TERM GOAL #1   Title Pt will  be independent in his advanced HEP.    Status On-going      PT LONG TERM GOAL #2   Title Pt will be able to amb community surfaces with no device safely for >/= 1000 feet.    Status Achieved      PT LONG TERM GOAL #3   Title Pt will improve his left LE strength to >/= 4+/5 to improve function    Status On-going                   Plan - 12/24/20 0913     Clinical Impression Statement Pt arriving reporting 4-5/10 pain in his left knee today. Pt reporting compliance and knowlege of his HEP.   Skilled PT interventions performed to progress pt's functional mobility toward goals set. Recommending strength progression, ROM, and functional mobility. Pt's AROM arc today was 8-85 degrees, passive ROM arc 6-90 degrees.    Personal Factors and Comorbidities Comorbidity 3+    Comorbidities back surgery, HTN, OA    Examination-Activity Limitations Lift;Transfers;Stairs;Stand;Squat    Examination-Participation Restrictions Other;Community Activity    Stability/Clinical Decision Making Stable/Uncomplicated    Rehab Potential Excellent    PT Frequency 2x / week    PT Duration 12 weeks    PT Treatment/Interventions ADLs/Self Care Home Management;Cryotherapy;Electrical Stimulation;Gait training;Stair training;Functional mobility training;Therapeutic activities;Therapeutic exercise;Balance training;Neuromuscular re-education;Patient/family education;Manual techniques;Passive range of motion;Taping;Vasopneumatic Device    PT Next Visit Plan progres AROM, strengtheing and overall functional mobillty/balance    PT Home Exercise Plan Access Code: [email or web address]    Consulted and Agree with Plan of Care Patient             Patient will benefit from skilled therapeutic intervention in order  to improve the following deficits and impairments:  Pain, Difficulty walking, Decreased range of motion, Decreased mobility, Decreased strength, Increased edema, Decreased balance, Impaired flexibility, Decreased  activity tolerance  Visit Diagnosis: Difficulty in walking, not elsewhere classified  Muscle weakness (generalized)  Localized edema  Stiffness of left knee, not elsewhere classified  Difficulty walking  Acute pain of left knee     Problem List Patient Active Problem List   Diagnosis Date Noted   H/O total knee replacement, left 11/11/2020   Leiomyoma 11/08/2020   Unilateral primary osteoarthritis, left knee 10/17/2020   Arthritis 09/26/2018   Onychomycosis 09/26/2018   Class 1 obesity due to excess calories without serious comorbidity with body mass index (BMI) of 32.0 to 32.9 in adult 09/06/2017   Hyperlipidemia LDL goal <100 08/31/2016   OSA on CPAP 08/31/2016   Premature ejaculation 08/19/2011    Oretha Caprice, PT, MPT 12/24/2020, 9:40 AM  [facility] [address] [place], [place], [address] Phone: [phone]   Fax:  [phone]  Name: EDSEL BURUCA MRN: [id number] Date of Birth: 06/06/1965

## 2020-12-25 ENCOUNTER — Ambulatory Visit (INDEPENDENT_AMBULATORY_CARE_PROVIDER_SITE_OTHER): Payer: Federal, State, Local not specified - PPO

## 2020-12-25 ENCOUNTER — Encounter: Payer: Self-pay | Admitting: Orthopaedic Surgery

## 2020-12-25 ENCOUNTER — Ambulatory Visit (INDEPENDENT_AMBULATORY_CARE_PROVIDER_SITE_OTHER): Payer: Federal, State, Local not specified - PPO | Admitting: Orthopaedic Surgery

## 2020-12-25 VITALS — BP 125/80 | HR 105 | Ht 68.75 in | Wt 220.0 lb

## 2020-12-25 DIAGNOSIS — Z96652 Presence of left artificial knee joint: Secondary | ICD-10-CM | POA: Diagnosis not present

## 2020-12-25 NOTE — Progress Notes (Signed)
Post-Op Visit Note   Patient: Corey [name]           Date of Birth: Jul 27, 1965           MRN: [id number] Visit Date: 12/25/2020 PCP: Denita Lung, MD   Assessment & Plan: Postop total knee arthroplasty left knee.  He has had continued supracondylar fusion noted.  He is walking with a slight knee limp.  He lacks 3 degrees to 5 degrees reaching full extension but has good flexion and good quad strength. Has not had a fall since surgery and x-rays comparison to 11/11/2020 images showed nondisplaced supracondylar fracture line which is filling in with callus formation nondisplaced.  We discussed he may have a little bit extra swelling for period of time till this resolves.  He will work on prone positioning getting the last few degrees of extension.  His flexion is good his quad strength is good he is making good progress.  Work slip given no work x2 months recheck 7 weeks. Chief Complaint:  Chief Complaint  Patient presents with   Left Knee - Follow-up    11/11/2020 Left TKA   Visit Diagnoses:  1. S/P TKR (total knee replacement), left     Plan: Return in 7 weeks repeat AP lateral left knee x-rays on return. Follow-Up Instructions: Return in about 7 weeks (around 02/12/2021).   Orders:  Orders Placed This Encounter  Procedures   XR Knee 1-2 Views Left   No orders of the defined types were placed in this encounter.   Imaging: XR Knee 1-2 Views Left  Result Date: 12/25/2020 AP lateral left knee x-rays are obtained and reviewed.  This shows well-positioned total knee arthroplasty without loosening or subsidence.  Nondisplaced medial femoral condyle nondisplaced fracture noted with some slight early callus formation supracondylar region medially.  Review of 11/11/2020 images showed a nondisplaced fracture. Impression: Post on the arthroplasty with nondisplaced medial supracondylar fracture with interval healing.   PMFS History: Patient Active Problem List   Diagnosis Date Noted    H/O total knee replacement, left 11/11/2020   Leiomyoma 11/08/2020   Unilateral primary osteoarthritis, left knee 10/17/2020   Arthritis 09/26/2018   Onychomycosis 09/26/2018   Class 1 obesity due to excess calories without serious comorbidity with body mass index (BMI) of 32.0 to 32.9 in adult 09/06/2017   Hyperlipidemia LDL goal <100 08/31/2016   OSA on CPAP 08/31/2016   Premature ejaculation 08/19/2011   Past Medical History:  Diagnosis Date   Arthritis    knee, left   ED (erectile dysfunction)    Hyperlipidemia    Hypertension    Sleep apnea    CPAP - not using lately due to recall     Family History  Problem Relation Age of Onset   Alzheimer's disease Mother    Stevens-Johnson syndrome Father    Autism Brother    Colon cancer Other    Colon polyps Neg Hx    Esophageal cancer Neg Hx    Rectal cancer Neg Hx    Stomach cancer Neg Hx     Past Surgical History:  Procedure Laterality Date   ANKLE BONE SURGERY     BACK SURGERY     COLLAR BONE SURGEY     COLONOSCOPY     FRACTURE SURGERY     HERNIA REPAIR     KNEE ARTHROSCOPY Left 2006   LUMBAR MICRODISCECTOMY     POLYPECTOMY     TOTAL KNEE ARTHROPLASTY Left 11/11/2020  Procedure: LEFT TOTAL KNEE ARTHROPLASTY;  Surgeon: Marybelle Killings, MD;  Location: [place];  Service: Orthopedics;  Laterality: Left;   UMBILICAL HERNIA REPAIR     Social History   Occupational History   Occupation: Engineer, agricultural  Tobacco Use   Smoking status: Never   Smokeless tobacco: Never  Vaping Use   Vaping Use: Never used  Substance and Sexual Activity   Alcohol use: Yes    Alcohol/week: 3.0 standard drinks    Types: 3 Glasses of wine per week    Comment: occasional   Drug use: Not Currently    Types: Marijuana   Sexual activity: Yes

## 2020-12-26 ENCOUNTER — Other Ambulatory Visit: Payer: Self-pay

## 2020-12-26 ENCOUNTER — Ambulatory Visit: Payer: Federal, State, Local not specified - PPO | Admitting: Rehabilitative and Restorative Service Providers"

## 2020-12-26 ENCOUNTER — Encounter: Payer: Self-pay | Admitting: Rehabilitative and Restorative Service Providers"

## 2020-12-26 DIAGNOSIS — R262 Difficulty in walking, not elsewhere classified: Secondary | ICD-10-CM

## 2020-12-26 DIAGNOSIS — M25662 Stiffness of left knee, not elsewhere classified: Secondary | ICD-10-CM | POA: Diagnosis not present

## 2020-12-26 DIAGNOSIS — R600 Localized edema: Secondary | ICD-10-CM | POA: Diagnosis not present

## 2020-12-26 DIAGNOSIS — M6281 Muscle weakness (generalized): Secondary | ICD-10-CM

## 2020-12-26 NOTE — Patient Instructions (Signed)
Continued emphasis on extension AROM and strength along with flexion AROM 2X/Day

## 2020-12-26 NOTE — Therapy (Signed)
[facility] Physical Therapy [address] [place], [place], [address] Phone: [phone]   Fax:  [phone]  Physical Therapy Treatment  Patient Details  Name: Corey [name] MRN: [id number] Date of Birth: 1965-11-16 Referring Provider (PT): Benjiman Core PA-C   Encounter Date: 12/26/2020   PT End of Session - 12/26/20 0923     Visit Number 6    Number of Visits 24    Date for PT Re-Evaluation 02/28/21    Authorization Type BCBS    Progress Note Due on Visit 10    PT Start Time 0847    PT Stop Time 0938    PT Time Calculation (min) 51 min    Activity Tolerance Patient tolerated treatment well    Behavior During Therapy [facility] for tasks assessed/performed             Past Medical History:  Diagnosis Date   Arthritis    knee, left   ED (erectile dysfunction)    Hyperlipidemia    Hypertension    Sleep apnea    CPAP - not using lately due to recall     Past Surgical History:  Procedure Laterality Date   ANKLE [place] ARTHROSCOPY Left 2006   LUMBAR MICRODISCECTOMY     POLYPECTOMY     TOTAL KNEE ARTHROPLASTY Left 11/11/2020   Procedure: LEFT TOTAL KNEE ARTHROPLASTY;  Surgeon: Marybelle Killings, MD;  Location: [facility];  Service: Orthopedics;  Laterality: Left;   UMBILICAL HERNIA REPAIR      There were no vitals filed for this visit.   Subjective Assessment - 12/26/20 0853     Subjective Treyvin is taking Tramadol 2X/day.  He is sleeping about 4 hours uninterrupted.  2X/day HEP compliance.    Pertinent History Back surgery, HTN, OA,    Limitations Walking;House hold activities;Lifting    How long can you stand comfortably? 1-1.5 hour (was 30 minutes)    Diagnostic tests X-ray    Patient Stated Goals I want to get back to 100%, work out again    Currently in Pain? Yes    Pain Score 1     Pain Location Knee    Pain Orientation Left     Pain Descriptors / Indicators Tightness    Pain Type Surgical pain;Chronic pain    Pain Radiating Towards NA    Pain Onset More than a month ago    Pain Frequency Rarely    Aggravating Factors  Limited knee AROM affects most ADLs.    Pain Relieving Factors Exercises, change of position and ice.    Effect of Pain on Daily Activities Affects all WB function.    Multiple Pain Sites No                [facility] PT Assessment - 12/26/20 0001       ROM / Strength   AROM / PROM / Strength AROM      AROM   Overall AROM  Deficits    AROM Assessment Site Knee    Right/Left Knee Left    Left Knee Extension -8    Left Knee Flexion 86                           [facility] Adult PT  Treatment/Exercise - 12/26/20 0001       Exercises   Exercises Knee/Hip      Knee/Hip Exercises: Stretches   Other Knee/Hip Stretches Prone knee extension stretch with rolled up towels above knees and 2# weight on L heel 2 minutes    Other Knee/Hip Stretches Seated knee extension stretch with self-overpressure above knee joint 10X 10 seconds      Knee/Hip Exercises: Aerobic   Recumbent Bike Seat 9 for 8 minutes AAROM      Knee/Hip Exercises: Machines for Strengthening   Total Gym Leg Press 75# L leg only 25X full extension to as much flexion as possible with slow eccentrics and 5 second pause at extension and flexion      Knee/Hip Exercises: Seated   Other Seated Knee/Hip Exercises Knee flexion AAROM (R pushes L into flexion) 10X 10 seconds and tailgate knee flexion 1 minute      Knee/Hip Exercises: Supine   Quad Sets Strengthening;Left;10 reps    Quad Sets Limitations 2 sets 5 seconds    Heel Slides AROM;Left;10 reps    Heel Slides Limitations 10 seconds with a belt      Modalities   Modalities Vasopneumatic      Vasopneumatic   Number Minutes Vasopneumatic  10 minutes    Vasopnuematic Location  Knee    Vasopneumatic Pressure Medium    Vasopneumatic Temperature  34                     PT Education - 12/26/20 [id number]     Education Details Reviewed HEP with emphasis on extension and flexion AROM (minimum 2X/day compliance).    Person(s) Educated Patient    Methods Explanation;Demonstration;Tactile cues;Verbal cues    Comprehension Verbalized understanding;Need further instruction;Returned demonstration;Tactile cues required;Verbal cues required              PT Short Term Goals - 12/24/20 0933       PT SHORT TERM GOAL #1   Title Pt will be independent in his initial HEP.    Status Achieved      PT SHORT TERM GOAL #2   Title pt will improve his 5 time sit to stand to </= 14 seconds with no UE support    Status On-going               PT Long Term Goals - 12/24/20 0934       PT LONG TERM GOAL #1   Title Pt will be independent in his advanced HEP.    Status On-going      PT LONG TERM GOAL #2   Title Pt will be able to amb community surfaces with no device safely for >/= 1000 feet.    Status Achieved      PT LONG TERM GOAL #3   Title Pt will improve his left LE strength to >/= 4+/5 to improve function    Status On-going                   Plan - 12/26/20 0924     Clinical Impression Statement Viraaj is doing well as far as pain, stiffness is most limiting.  Although AROM has improved from evaluation, extension and flexion AROM remain limited and remain the focus of continued HEP compliance (2X/day minimum recommended).  Continue AROM emphasis with quadriceps strength, balance and gait activities as time and progress allow.    Personal Factors and Comorbidities Comorbidity 3+    Comorbidities back surgery, HTN,  OA    Examination-Activity Limitations Lift;Transfers;Stairs;Stand;Squat    Examination-Participation Restrictions Other;Community Activity    Stability/Clinical Decision Making Stable/Uncomplicated    Rehab Potential Excellent    PT Frequency 2x / week    PT Duration 12 weeks    PT Treatment/Interventions ADLs/Self  Care Home Management;Cryotherapy;Electrical Stimulation;Gait training;Stair training;Functional mobility training;Therapeutic activities;Therapeutic exercise;Balance training;Neuromuscular re-education;Patient/family education;Manual techniques;Passive range of motion;Taping;Vasopneumatic Device    PT Next Visit Plan Extension and flexion AROM emphasis    PT Home Exercise Plan Access Code: [email or web address]    Consulted and Agree with Plan of Care Patient             Patient will benefit from skilled therapeutic intervention in order to improve the following deficits and impairments:  Pain, Difficulty walking, Decreased range of motion, Decreased mobility, Decreased strength, Increased edema, Decreased balance, Impaired flexibility, Decreased activity tolerance  Visit Diagnosis: Difficulty in walking, not elsewhere classified  Muscle weakness (generalized)  Localized edema  Stiffness of left knee, not elsewhere classified     Problem List Patient Active Problem List   Diagnosis Date Noted   H/O total knee replacement, left 11/11/2020   Leiomyoma 11/08/2020   Unilateral primary osteoarthritis, left knee 10/17/2020   Arthritis 09/26/2018   Onychomycosis 09/26/2018   Class 1 obesity due to excess calories without serious comorbidity with body mass index (BMI) of 32.0 to 32.9 in adult 09/06/2017   Hyperlipidemia LDL goal <100 08/31/2016   OSA on CPAP 08/31/2016   Premature ejaculation 08/19/2011    Farley Ly PT, MPT 12/26/2020, 11:45 AM  [facility] Physical Therapy [address] [place], [place], [address] Phone: [phone]   Fax:  [phone]  Name: KAIRO RITTER MRN: [id number] Date of Birth: 14-Jan-1966

## 2020-12-30 ENCOUNTER — Telehealth: Payer: Federal, State, Local not specified - PPO | Admitting: Family Medicine

## 2020-12-30 ENCOUNTER — Encounter: Payer: Self-pay | Admitting: Family Medicine

## 2020-12-30 ENCOUNTER — Other Ambulatory Visit: Payer: Self-pay

## 2020-12-30 VITALS — Temp 98.9°F | Wt 220.0 lb

## 2020-12-30 DIAGNOSIS — U071 COVID-19: Secondary | ICD-10-CM

## 2020-12-30 DIAGNOSIS — G4733 Obstructive sleep apnea (adult) (pediatric): Secondary | ICD-10-CM | POA: Diagnosis not present

## 2020-12-30 NOTE — Progress Notes (Signed)
   Subjective:    Patient ID: Corey [name], male    DOB: Mar 08, 1966, 55 y.o.   MRN: [id number]  HPI Documentation for virtual audio and video telecommunications through [place] encounter: The patient was located at home. 2 patient identifiers used.  The provider was located in the office. The patient did consent to this visit and is aware of possible charges through their insurance for this visit. The other persons participating in this telemedicine service were none. Time spent on call was 5 minutes and in review of previous records >15 minutes total for counseling and coordination of care. This virtual service is not related to other E/M service within previous 7 days.  He states that last Monday he developed dizziness and a slight sore throat followed by a slight cough and did have 1 episode of chills but since then he has started to feel much better.  He did test himself yesterday and was positive for COVID.  Review of Systems     Objective:   Physical Exam Alert and in no distress otherwise not examined.  He does not appear toxic or tachypneic.       Assessment & Plan:  COVID-19 I explained that since he is now past the 5 days of major concern and getting better, no further intervention needed.  Recommend that he wear a mask for the next 5 days.  Explained that doing further testing at the present time would be counterproductive.  I explained that the test can sometimes remain positive for a long period of time but does not indicate infection.  He was comfortable with that.

## 2020-12-31 ENCOUNTER — Encounter: Payer: Federal, State, Local not specified - PPO | Admitting: Physical Therapy

## 2020-12-31 ENCOUNTER — Other Ambulatory Visit: Payer: Self-pay | Admitting: Orthopaedic Surgery

## 2020-12-31 NOTE — Telephone Encounter (Signed)
Please advise 

## 2020-12-31 NOTE — Telephone Encounter (Signed)
Aspirin for one month. finished

## 2021-01-02 ENCOUNTER — Encounter: Payer: Federal, State, Local not specified - PPO | Admitting: Rehabilitative and Restorative Service Providers"

## 2021-01-07 ENCOUNTER — Encounter: Payer: Self-pay | Admitting: Physical Therapy

## 2021-01-07 ENCOUNTER — Other Ambulatory Visit: Payer: Self-pay

## 2021-01-07 ENCOUNTER — Ambulatory Visit: Payer: Federal, State, Local not specified - PPO | Admitting: Physical Therapy

## 2021-01-07 DIAGNOSIS — M25562 Pain in left knee: Secondary | ICD-10-CM

## 2021-01-07 DIAGNOSIS — R262 Difficulty in walking, not elsewhere classified: Secondary | ICD-10-CM | POA: Diagnosis not present

## 2021-01-07 DIAGNOSIS — R600 Localized edema: Secondary | ICD-10-CM

## 2021-01-07 DIAGNOSIS — M25662 Stiffness of left knee, not elsewhere classified: Secondary | ICD-10-CM

## 2021-01-07 DIAGNOSIS — M6281 Muscle weakness (generalized): Secondary | ICD-10-CM | POA: Diagnosis not present

## 2021-01-07 NOTE — Therapy (Signed)
[facility] Physical Therapy [address] [place], [place], [address] Phone: [phone]   Fax:  [phone]  Physical Therapy Treatment  Patient Details  Name: Corey [name] MRN: [id number] Date of Birth: [birth year] Referring Provider (PT): Benjiman Core PA-C   Encounter Date: 01/07/2021   PT End of Session - 01/07/21 0847     Visit Number 7    Number of Visits 24    Date for PT Re-Evaluation 02/28/21    Authorization Type BCBS    Progress Note Due on Visit 10    PT Start Time 0842    PT Stop Time 0925    PT Time Calculation (min) 43 min    Activity Tolerance Patient tolerated treatment well    Behavior During Therapy [facility] for tasks assessed/performed             Past Medical History:  Diagnosis Date   Arthritis    knee, left   ED (erectile dysfunction)    Hyperlipidemia    Hypertension    Sleep apnea    CPAP - not using lately due to recall     Past Surgical History:  Procedure Laterality Date   ANKLE [place] ARTHROSCOPY Left 2006   LUMBAR MICRODISCECTOMY     POLYPECTOMY     TOTAL KNEE ARTHROPLASTY Left 11/11/2020   Procedure: LEFT TOTAL KNEE ARTHROPLASTY;  Surgeon: Marybelle Killings, MD;  Location: [facility];  Service: Orthopedics;  Laterality: Left;   UMBILICAL HERNIA REPAIR      There were no vitals filed for this visit.   Subjective Assessment - 01/07/21 0840     Subjective Pt arriving to visit today frustrated with continued swelling. Pt reporting sleeping has improved some.    Pertinent History Back surgery, HTN, OA,    Limitations Walking;House hold activities;Lifting    How long can you stand comfortably? 1-1.5 hour (was 30 minutes)    Diagnostic tests X-ray    Patient Stated Goals I want to get back to 100%, work out again    Currently in Pain? Yes    Pain Score 1     Pain Location Knee    Pain Orientation Left     Pain Descriptors / Indicators Tightness;Sore    Pain Type Surgical pain    Pain Onset More than a month ago                [facility] PT Assessment - 01/07/21 0001       ROM / Strength   AROM / PROM / Strength AROM      AROM   AROM Assessment Site Knee    Right/Left Knee Left    Left Knee Extension -6    Left Knee Flexion 90                           [facility] Adult PT Treatment/Exercise - 01/07/21 0001       Therapeutic Activites    Therapeutic Activities Other Therapeutic Activities    Other Therapeutic Activities up and down clinic steps x 3 first time with single hand rail and progressing to no rail with step over step pattern (6 inch step)      Exercises   Exercises Knee/Hip  Knee/Hip Exercises: Aerobic   Recumbent Bike Seat 9 for 7 minutes AAROM      Knee/Hip Exercises: Machines for Strengthening   Cybex Knee Extension Left LE only: 15# 3x10    Cybex Knee Flexion Left LE only: 35# 2x15    Total Gym Leg Press left LE only 100# 2x10, bilateral LE"s 150# x 20    Other Machine TM: 2.0-2.3 mph x 6 minutes with no UE support, progressing with heel to toe gait pattern.      Knee/Hip Exercises: Standing   Other Standing Knee Exercises TRX squats 3x10      Manual Therapy   Manual therapy comments PROM: flexion/extension                      PT Short Term Goals - 01/07/21 0859       PT SHORT TERM GOAL #1   Title Pt will be independent in his initial HEP.    Status Achieved      PT SHORT TERM GOAL #2   Title pt will improve his 5 time sit to stand to </= 14 seconds with no UE support    Baseline 11 seconds no UE support    Status Achieved               PT Long Term Goals - 01/07/21 0906       PT LONG TERM GOAL #1   Title Pt will be independent in his advanced HEP.    Status On-going      PT LONG TERM GOAL #2   Title Pt will be able to amb community surfaces with no device safely for >/= 1000 feet.    Status Achieved       PT LONG TERM GOAL #3   Title Pt will improve his left LE strength to >/= 4+/5 to improve function    Status On-going      PT LONG TERM GOAL #4   Title Pt will improve his left knee ROM 0-120 degrees actively.    Status On-going      PT LONG TERM GOAL #5   Title Pt will be able to navigate 1 flight of steps with single hand rail with pain </= 2/10.    Baseline up and down clinic steps without hand rail with step over step pattern slowly with pain at 1/10    Status Achieved      PT LONG TERM GOAL #6   Title Pt will improve his FOTO to >/= 55.    Status On-going                   Plan - 01/07/21 0850     Clinical Impression Statement Pt tolerating strengthening and functional exercises today as well as dynamic single leg stance. Pt is making progress with overall function however still concerned with swelling. Pt instructed to continue to elevate and ice as needed. Pt able to amb up and down steps in clinic without handrail with step over step gait pattern slowly for caution with little pain reported. Next visit progress to 8 inch steps.  Pt reporting compliance in his HEP. Continue skilled PT to maximize function.    Personal Factors and Comorbidities Comorbidity 3+    Comorbidities back surgery, HTN, OA    Examination-Activity Limitations Lift;Transfers;Stairs;Stand;Squat    Examination-Participation Restrictions Other;Community Activity    Stability/Clinical Decision Making Stable/Uncomplicated    Rehab Potential Excellent    PT Frequency 2x / week  PT Duration 12 weeks    PT Treatment/Interventions ADLs/Self Care Home Management;Cryotherapy;Electrical Stimulation;Gait training;Stair training;Functional mobility training;Therapeutic activities;Therapeutic exercise;Balance training;Neuromuscular re-education;Patient/family education;Manual techniques;Passive range of motion;Taping;Vasopneumatic Device    PT Next Visit Plan 8 inch steps, sinle leg stance activities,  machines for strengthening, functional mobility.    PT Home Exercise Plan Access Code: [email or web address] and Agree with Plan of Care Patient             Patient will benefit from skilled therapeutic intervention in order to improve the following deficits and impairments:  Pain, Difficulty walking, Decreased range of motion, Decreased mobility, Decreased strength, Increased edema, Decreased balance, Impaired flexibility, Decreased activity tolerance  Visit Diagnosis: Difficulty in walking, not elsewhere classified  Muscle weakness (generalized)  Localized edema  Stiffness of left knee, not elsewhere classified  Difficulty walking  Acute pain of left knee     Problem List Patient Active Problem List   Diagnosis Date Noted   H/O total knee replacement, left 11/11/2020   Leiomyoma 11/08/2020   Unilateral primary osteoarthritis, left knee 10/17/2020   Arthritis 09/26/2018   Onychomycosis 09/26/2018   Class 1 obesity due to excess calories without serious comorbidity with body mass index (BMI) of 32.0 to 32.9 in adult 09/06/2017   Hyperlipidemia LDL goal <100 08/31/2016   OSA on CPAP 08/31/2016   Premature ejaculation 08/19/2011    Oretha Caprice, PT, MPT 01/07/2021, 9:30 [facility] [address] [place], [place], [address] Phone: [phone]   Fax:  [phone]  Name: KANDEN WILMOTT MRN: [id number] Date of Birth: 03-26-1966

## 2021-01-09 ENCOUNTER — Other Ambulatory Visit: Payer: Self-pay

## 2021-01-09 ENCOUNTER — Encounter: Payer: Self-pay | Admitting: Rehabilitative and Restorative Service Providers"

## 2021-01-09 ENCOUNTER — Ambulatory Visit: Payer: Federal, State, Local not specified - PPO | Admitting: Rehabilitative and Restorative Service Providers"

## 2021-01-09 DIAGNOSIS — R600 Localized edema: Secondary | ICD-10-CM | POA: Diagnosis not present

## 2021-01-09 DIAGNOSIS — M6281 Muscle weakness (generalized): Secondary | ICD-10-CM

## 2021-01-09 DIAGNOSIS — R262 Difficulty in walking, not elsewhere classified: Secondary | ICD-10-CM

## 2021-01-09 DIAGNOSIS — M25662 Stiffness of left knee, not elsewhere classified: Secondary | ICD-10-CM

## 2021-01-09 NOTE — Patient Instructions (Signed)
Focus on 100 quadriceps sets/day, prone knee extension stretch and AAROM knee flexion (R pushes L into flexion)

## 2021-01-09 NOTE — Therapy (Signed)
[facility] Physical Therapy [address] [place], [place], [address] Phone: [phone]   Fax:  [phone]  Physical Therapy Treatment  Patient Details  Name: Corey [name] MRN: [id number] Date of Birth: 01-25-1966 Referring Provider (PT): Benjiman Core PA-C   Encounter Date: 01/09/2021   PT End of Session - 01/09/21 0928     Visit Number 8    Number of Visits 24    Date for PT Re-Evaluation 02/28/21    Authorization Type BCBS    Progress Note Due on Visit 10    PT Start Time 0845    PT Stop Time 0935    PT Time Calculation (min) 50 min    Activity Tolerance Patient tolerated treatment well;No increased pain    Behavior During Therapy [facility] for tasks assessed/performed             Past Medical History:  Diagnosis Date   Arthritis    knee, left   ED (erectile dysfunction)    Hyperlipidemia    Hypertension    Sleep apnea    CPAP - not using lately due to recall     Past Surgical History:  Procedure Laterality Date   ANKLE BONE SURGERY     BACK SURGERY     COLLAR BONE SURGEY     COLONOSCOPY     FRACTURE SURGERY     HERNIA REPAIR     KNEE ARTHROSCOPY Left 2006   LUMBAR MICRODISCECTOMY     POLYPECTOMY     TOTAL KNEE ARTHROPLASTY Left 11/11/2020   Procedure: LEFT TOTAL KNEE ARTHROPLASTY;  Surgeon: Marybelle Killings, MD;  Location: [facility];  Service: Orthopedics;  Laterality: Left;   UMBILICAL HERNIA REPAIR      There were no vitals filed for this visit.   Subjective Assessment - 01/09/21 0848     Subjective Corey [name] is down to 2 Tramadol a day for pain management.  He is now sleeping 5-6 hours uninterrupted (was 4 hours last week).    Pertinent History Back surgery, HTN, OA,    Limitations Walking;House hold activities;Lifting    How long can you sit comfortably? Stiffness when he changes position    How long can you stand comfortably? 1-1.5 hour (was 30 minutes)    How long can you walk comfortably? Grocery store, short errands.    Diagnostic  tests X-ray    Patient Stated Goals I want to get back to 100%, work out again    Currently in Pain? No/denies    Pain Score 0-No pain    Pain Location Knee    Pain Orientation Left    Pain Descriptors / Indicators Tightness;Sore    Pain Type Surgical pain    Pain Radiating Towards NA    Pain Onset More than a month ago    Pain Frequency Rarely    Aggravating Factors  Too much WB results in increased edema.  Stiffness limits end range AROM.    Pain Relieving Factors Exercises, change of position and ice.    Effect of Pain on Daily Activities Out of work and limited WB function.    Multiple Pain Sites No                [facility] PT Assessment - 01/09/21 0001       ROM / Strength   AROM / PROM / Strength AROM      AROM   AROM Assessment Site Knee    Right/Left Knee Left    Left Knee  Extension -4    Left Knee Flexion 97                           [facility] Adult PT Treatment/Exercise - 01/09/21 0001       Neuro Re-ed    Neuro Re-ed Details  Tandem balance, eyes open/head turn/eyes closed and dynamic heel to toe eyes open/head turning/eyes closed      Exercises   Exercises Knee/Hip      Knee/Hip Exercises: Stretches   Other Knee/Hip Stretches Prone knee extension stretch with rolled up towels above knees and 4# weight on L heel 3 minutes      Knee/Hip Exercises: Aerobic   Recumbent Bike Seat 11 for 8 minutes      Knee/Hip Exercises: Machines for Strengthening   Total Gym Leg Press 87# L leg only 25X full extension to as much flexion as possible with slow eccentrics and 5 second pause at extension and flexion      Knee/Hip Exercises: Seated   Other Seated Knee/Hip Exercises Knee flexion AAROM (R pushes L into flexion) 10X 10 seconds and tailgate knee flexion 1 minute      Knee/Hip Exercises: Supine   Quad Sets Strengthening;Left;10 reps    Quad Sets Limitations 2 sets 5 seconds      Modalities   Modalities Vasopneumatic      Vasopneumatic   Number  Minutes Vasopneumatic  10 minutes    Vasopnuematic Location  Knee    Vasopneumatic Pressure Medium    Vasopneumatic Temperature  34                    PT Education - 01/09/21 0927     Education Details Reviewed HEP and progressed balance (static and dynamic)    Person(s) Educated Patient    Methods Explanation;Demonstration;Tactile cues;Verbal cues    Comprehension Verbal cues required;Returned demonstration;Need further instruction;Verbalized understanding;Tactile cues required              PT Short Term Goals - 01/07/21 0859       PT SHORT TERM GOAL #1   Title Pt will be independent in his initial HEP.    Status Achieved      PT SHORT TERM GOAL #2   Title pt will improve his 5 time sit to stand to </= 14 seconds with no UE support    Baseline 11 seconds no UE support    Status Achieved               PT Long Term Goals - 01/07/21 0906       PT LONG TERM GOAL #1   Title Pt will be independent in his advanced HEP.    Status On-going      PT LONG TERM GOAL #2   Title Pt will be able to amb community surfaces with no device safely for >/= 1000 feet.    Status Achieved      PT LONG TERM GOAL #3   Title Pt will improve his left LE strength to >/= 4+/5 to improve function    Status On-going      PT LONG TERM GOAL #4   Title Pt will improve his left knee ROM 0-120 degrees actively.    Status On-going      PT LONG TERM GOAL #5   Title Pt will be able to navigate 1 flight of steps with single hand rail with pain </= 2/10.  Baseline up and down clinic steps without hand rail with step over step pattern slowly with pain at 1/10    Status Achieved      PT LONG TERM GOAL #6   Title Pt will improve his FOTO to >/= 55.    Status On-going                   Plan - 01/09/21 0930     Clinical Impression Statement Kincaid is giving great effort and making rapid gains with his AROM over the past week.  Returning full knee extension AROM,  progressing flexion AROM, managing edema and improving quadriceps strength remain high priorities.    Personal Factors and Comorbidities Comorbidity 3+    Comorbidities back surgery, HTN, OA    Examination-Activity Limitations Lift;Transfers;Stairs;Stand;Squat    Examination-Participation Restrictions Other;Community Activity    Stability/Clinical Decision Making Stable/Uncomplicated    Rehab Potential Excellent    PT Frequency 2x / week    PT Duration 12 weeks    PT Treatment/Interventions ADLs/Self Care Home Management;Cryotherapy;Electrical Stimulation;Gait training;Stair training;Functional mobility training;Therapeutic activities;Therapeutic exercise;Balance training;Neuromuscular re-education;Patient/family education;Manual techniques;Passive range of motion;Taping;Vasopneumatic Device    PT Next Visit Plan Continue to work on full AROM (particularly extension), balance and stairs    PT Home Exercise Plan Access Code: [email or web address]    Consulted and Agree with Plan of Care Patient             Patient will benefit from skilled therapeutic intervention in order to improve the following deficits and impairments:  Pain, Difficulty walking, Decreased range of motion, Decreased mobility, Decreased strength, Increased edema, Decreased balance, Impaired flexibility, Decreased activity tolerance  Visit Diagnosis: Difficulty in walking, not elsewhere classified  Muscle weakness (generalized)  Localized edema  Stiffness of left knee, not elsewhere classified     Problem List Patient Active Problem List   Diagnosis Date Noted   H/O total knee replacement, left 11/11/2020   Leiomyoma 11/08/2020   Unilateral primary osteoarthritis, left knee 10/17/2020   Arthritis 09/26/2018   Onychomycosis 09/26/2018   Class 1 obesity due to excess calories without serious comorbidity with body mass index (BMI) of 32.0 to 32.9 in adult 09/06/2017   Hyperlipidemia LDL goal <100 08/31/2016   OSA on CPAP  08/31/2016   Premature ejaculation 08/19/2011    Farley Ly PT, MPT 01/09/2021, 9:33 AM  [facility] [address] [place], [place], [address] Phone: [phone]   Fax:  [phone]  Name: CHAYAN DUSKEY MRN: [id number] Date of Birth: 03/06/1966

## 2021-01-12 ENCOUNTER — Other Ambulatory Visit: Payer: Self-pay

## 2021-01-12 ENCOUNTER — Inpatient Hospital Stay (HOSPITAL_COMMUNITY)
Admission: EM | Admit: 2021-01-12 | Discharge: 2021-01-14 | DRG: 331 | Disposition: A | Discharge status: Home / Self Care | Payer: Federal, State, Local not specified - PPO | Attending: Surgery | Admitting: Surgery

## 2021-01-12 ENCOUNTER — Encounter (HOSPITAL_COMMUNITY): Payer: Self-pay

## 2021-01-12 ENCOUNTER — Emergency Department (HOSPITAL_COMMUNITY): Payer: Federal, State, Local not specified - PPO

## 2021-01-12 DIAGNOSIS — K3532 Acute appendicitis with perforation and localized peritonitis, without abscess: Principal | ICD-10-CM | POA: Diagnosis present

## 2021-01-12 DIAGNOSIS — E669 Obesity, unspecified: Secondary | ICD-10-CM | POA: Diagnosis present

## 2021-01-12 DIAGNOSIS — K3580 Unspecified acute appendicitis: Secondary | ICD-10-CM | POA: Diagnosis present

## 2021-01-12 DIAGNOSIS — Z96652 Presence of left artificial knee joint: Secondary | ICD-10-CM | POA: Diagnosis present

## 2021-01-12 DIAGNOSIS — E785 Hyperlipidemia, unspecified: Secondary | ICD-10-CM | POA: Diagnosis not present

## 2021-01-12 DIAGNOSIS — I10 Essential (primary) hypertension: Secondary | ICD-10-CM | POA: Diagnosis not present

## 2021-01-12 DIAGNOSIS — Z20822 Contact with and (suspected) exposure to covid-19: Secondary | ICD-10-CM | POA: Diagnosis present

## 2021-01-12 DIAGNOSIS — Z6833 Body mass index (BMI) 33.0-33.9, adult: Secondary | ICD-10-CM | POA: Diagnosis not present

## 2021-01-12 DIAGNOSIS — R109 Unspecified abdominal pain: Secondary | ICD-10-CM | POA: Diagnosis not present

## 2021-01-12 DIAGNOSIS — Z79899 Other long term (current) drug therapy: Secondary | ICD-10-CM | POA: Diagnosis not present

## 2021-01-12 DIAGNOSIS — E6609 Other obesity due to excess calories: Secondary | ICD-10-CM

## 2021-01-12 DIAGNOSIS — G4733 Obstructive sleep apnea (adult) (pediatric): Secondary | ICD-10-CM

## 2021-01-12 DIAGNOSIS — Z9119 Patient's noncompliance with other medical treatment and regimen: Secondary | ICD-10-CM | POA: Diagnosis not present

## 2021-01-12 DIAGNOSIS — Z9049 Acquired absence of other specified parts of digestive tract: Secondary | ICD-10-CM

## 2021-01-12 DIAGNOSIS — Z800 Family history of malignant neoplasm of digestive organs: Secondary | ICD-10-CM | POA: Diagnosis not present

## 2021-01-12 DIAGNOSIS — K760 Fatty (change of) liver, not elsewhere classified: Secondary | ICD-10-CM | POA: Diagnosis not present

## 2021-01-12 DIAGNOSIS — K353 Acute appendicitis with localized peritonitis, without perforation or gangrene: Secondary | ICD-10-CM | POA: Diagnosis present

## 2021-01-12 DIAGNOSIS — G4730 Sleep apnea, unspecified: Secondary | ICD-10-CM | POA: Diagnosis not present

## 2021-01-12 DIAGNOSIS — Z7982 Long term (current) use of aspirin: Secondary | ICD-10-CM | POA: Diagnosis not present

## 2021-01-12 DIAGNOSIS — Z6832 Body mass index (BMI) 32.0-32.9, adult: Secondary | ICD-10-CM

## 2021-01-12 LAB — COMPREHENSIVE METABOLIC PANEL
ALT: 25 U/L (ref 0–44)
AST: 22 U/L (ref 15–41)
Albumin: 3.9 g/dL (ref 3.5–5.0)
Alkaline Phosphatase: 66 U/L (ref 38–126)
Anion gap: 13 (ref 5–15)
BUN: 12 mg/dL (ref 6–20)
CO2: 26 mmol/L (ref 22–32)
Calcium: 9.2 mg/dL (ref 8.9–10.3)
Chloride: 98 mmol/L (ref 98–111)
Creatinine, Ser: 1.17 mg/dL (ref 0.61–1.24)
GFR, Estimated: >60 mL/min (ref >60)
Glucose, Bld: 142 mg/dL — ABNORMAL HIGH (ref 70–99)
Potassium: 4 mmol/L (ref 3.5–5.1)
Sodium: 137 mmol/L (ref 135–145)
Total Bilirubin: 0.7 mg/dL (ref 0.3–1.2)
Total Protein: 7.8 g/dL (ref 6.5–8.1)

## 2021-01-12 LAB — URINALYSIS, ROUTINE W REFLEX MICROSCOPIC
Bilirubin Urine: NEGATIVE
Glucose, UA: NEGATIVE mg/dL
Ketones, ur: NEGATIVE mg/dL
Leukocytes,Ua: NEGATIVE
Nitrite: NEGATIVE
Specific Gravity, Urine: 1.02 (ref 1.005–1.030)
pH: 7 (ref 5.0–8.0)

## 2021-01-12 LAB — CBC WITH DIFFERENTIAL/PLATELET
Abs Immature Granulocytes: 0.13 10*3/uL — ABNORMAL HIGH (ref 0.00–0.07)
Basophils Absolute: 0 10*3/uL (ref 0.0–0.1)
Basophils Relative: 0 %
Eosinophils Absolute: 0 10*3/uL (ref 0.0–0.5)
Eosinophils Relative: 0 %
HCT: 39.7 % (ref 39.0–52.0)
Hemoglobin: 12.6 g/dL — ABNORMAL LOW (ref 13.0–17.0)
Immature Granulocytes: 1 %
Lymphocytes Relative: 6 %
Lymphs Abs: 1.1 10*3/uL (ref 0.7–4.0)
MCH: 27.7 pg (ref 26.0–34.0)
MCHC: 31.7 g/dL (ref 30.0–36.0)
MCV: 87.3 fL (ref 80.0–100.0)
Monocytes Absolute: 0.9 10*3/uL (ref 0.1–1.0)
Monocytes Relative: 6 %
Neutro Abs: 14.7 10*3/uL — ABNORMAL HIGH (ref 1.7–7.7)
Neutrophils Relative %: 87 %
Platelets: 279 10*3/uL (ref 150–400)
RBC: 4.55 MIL/uL (ref 4.22–5.81)
RDW: 14.6 % (ref 11.5–15.5)
WBC: 16.8 10*3/uL — ABNORMAL HIGH (ref 4.0–10.5)
nRBC: 0 % (ref 0.0–0.2)

## 2021-01-12 LAB — LIPASE, BLOOD: Lipase: 31 U/L (ref 11–51)

## 2021-01-12 LAB — URINALYSIS, MICROSCOPIC (REFLEX)

## 2021-01-12 LAB — RESP PANEL BY RT-PCR (FLU A&B, COVID) ARPGX2
Influenza A by PCR: NEGATIVE
Influenza B by PCR: NEGATIVE
SARS Coronavirus 2 by RT PCR: NEGATIVE

## 2021-01-12 MED ORDER — SODIUM CHLORIDE 0.9 % IV SOLN
2.0000 g | Freq: Once | INTRAVENOUS | Status: AC
  Administered 2021-01-12: 2 g via INTRAVENOUS
  Filled 2021-01-12: qty 2

## 2021-01-12 MED ORDER — FENTANYL CITRATE (PF) 100 MCG/2ML IJ SOLN
50.0000 ug | Freq: Once | INTRAMUSCULAR | Status: AC
  Administered 2021-01-12: 50 ug via INTRAVENOUS
  Filled 2021-01-12: qty 2

## 2021-01-12 MED ORDER — METRONIDAZOLE 500 MG/100ML IV SOLN
500.0000 mg | Freq: Once | INTRAVENOUS | Status: AC
  Administered 2021-01-12: 500 mg via INTRAVENOUS
  Filled 2021-01-12: qty 100

## 2021-01-12 MED ORDER — SODIUM CHLORIDE 0.9 % IV BOLUS
1000.0000 mL | Freq: Once | INTRAVENOUS | Status: AC
  Administered 2021-01-12: 1000 mL via INTRAVENOUS

## 2021-01-12 MED ORDER — IOHEXOL 350 MG/ML SOLN
80.0000 mL | Freq: Once | INTRAVENOUS | Status: AC | PRN
  Administered 2021-01-12: 80 mL via INTRAVENOUS

## 2021-01-12 NOTE — ED Notes (Signed)
PA-C at the bedside.  ?

## 2021-01-12 NOTE — ED Notes (Signed)
Patient transported to CT 

## 2021-01-12 NOTE — ED Triage Notes (Signed)
Pt presents to the ED for right flank pain radiating into his right lower abdomen which began yesterday. Pt reports accompanying sx of urinary frequency and nausea. Pt reports constipation as well and has not had a bowel movement in one week. He denies any known hx of kidney stones.

## 2021-01-12 NOTE — ED Provider Notes (Signed)
Emergency Medicine Provider Triage Evaluation Note  Corey [name] , a 55 y.o. male  was evaluated in triage.  Pt complains of right-sided abdominal pain onset yesterday, associated with nausea and dry heaves.  Additionally patient reports no bowel movement in the past 1 week.  Review of Systems  Positive: Abdominal pain, nausea, constipation Negative: Fever, chills, hematuria, dysuria, testicular pain/swelling, fall/injury  Physical Exam  BP (!) 151/94 (BP Location: Left Arm)   Pulse (!) 120   Temp 99.4 F (37.4 C) (Oral)   Ht '5\' 9"'$  (1.753 m)   Wt 102.1 kg   SpO2 98%   BMI 33.23 kg/m  Gen:   Awake, no distress   Resp:  Normal effort  MSK:   Moves extremities without difficulty  Other:  Abdomen soft, RUQ and RLQ TTP  Medical Decision Making  Medically screening exam initiated at 9:03 AM.  Appropriate orders placed.  Corey [name] was informed that the remainder of the evaluation will be completed by another provider, this initial triage assessment does not replace that evaluation, and the importance of remaining in the ED until their evaluation is complete.  Abdominal pain labs, COVID test, fluids, pain medication and CT abdomen pelvis ordered.  Note: Portions of this report may have been transcribed using voice recognition software. Every effort was made to ensure accuracy; however, inadvertent computerized transcription errors may still be present.    Corey [name] 01/12/21 [id number]    Corey Fuse, MD 01/12/21 [phone]

## 2021-01-12 NOTE — H&P (Addendum)
Corey [name] is an 55 y.o. male.   Chief Complaint:  Right-sided abdominal pain HPI: This is a 55 year old male who presents with one day of right-sided abdominal pain, nausea, anorexia.  Afebrile.  No shortness of breath.  He presented to the ED for evaluation.  He was found to have appendicitis with no sign of perforation or abscess.  Past Medical History:  Diagnosis Date   Arthritis    knee, left   ED (erectile dysfunction)    Hyperlipidemia    Hypertension    Sleep apnea    CPAP - not using lately due to recall     Past Surgical History:  Procedure Laterality Date   ANKLE BONE SURGERY     BACK SURGERY     COLLAR BONE SURGEY     COLONOSCOPY     FRACTURE SURGERY     HERNIA REPAIR     KNEE ARTHROSCOPY Left 2006   LUMBAR MICRODISCECTOMY     POLYPECTOMY     TOTAL KNEE ARTHROPLASTY Left 11/11/2020   Procedure: LEFT TOTAL KNEE ARTHROPLASTY;  Surgeon: Marybelle Killings, MD;  Location: [facility];  Service: Orthopedics;  Laterality: Left;   UMBILICAL HERNIA REPAIR      Family History  Problem Relation Age of Onset   Alzheimer's disease Mother    Stevens-Johnson syndrome Father    Autism Brother    Colon cancer Other    Colon polyps Neg Hx    Esophageal cancer Neg Hx    Rectal cancer Neg Hx    Stomach cancer Neg Hx    Social History:  reports that he has never smoked. He has never used smokeless tobacco. He reports current alcohol use of about 3.0 standard drinks per week. He reports that he does not currently use drugs after having used the following drugs: Marijuana.  Allergies:  Allergies  Allergen Reactions   Penicillins Other (See Comments)    Makes pt black out   Prior to Admission medications   Medication Sig Start Date End Date Taking? Authorizing Provider  aspirin EC 325 MG tablet Take 1 tablet (325 mg total) by mouth daily. MUST TAKE AT LEAST 4 WEEKS POSTOP FOR DVT PROPHYLAXIS 11/12/20  Yes Marybelle Killings, MD  Multiple Vitamins-Minerals (MULTIVITAMIN WITH MINERALS)  tablet Take 2 tablets by mouth daily.   Yes [provider]  PARoxetine (PAXIL) 20 MG tablet Take 1 tablet (20 mg total) by mouth daily. TAKE 1 TABLET BY MOUTH EVERY DAY IN THE MORNING 11/12/20  Yes Lanae Crumbly, PA-C  traMADol (ULTRAM) 50 MG tablet Take 1 tablet (50 mg total) by mouth every 6 (six) hours as needed. Patient taking differently: Take 50 mg by mouth every 6 (six) hours as needed for moderate pain. 12/06/20  Yes Marybelle Killings, MD  methocarbamol (ROBAXIN) 500 MG tablet Take 1 tablet (500 mg total) by mouth every 6 (six) hours as needed for muscle spasms. Patient not taking: No sig reported 11/12/20   Lanae Crumbly, PA-C  oxyCODONE-acetaminophen (PERCOCET/ROXICET) 5-325 MG tablet Take 1 tablet by mouth every 4 (four) hours as needed for severe pain. Patient not taking: No sig reported 11/12/20   Marybelle Killings, MD     Results for orders placed or performed during the hospital encounter of 01/12/21 (from the past 48 hour(s))  CBC with Differential     Status: Abnormal   Collection Time: 01/12/21  1:07 PM  Result Value Ref Range   WBC 16.8 (H)  4.0 - 10.5 K/uL   RBC 4.55 4.22 - 5.81 MIL/uL   Hemoglobin 12.6 (L) 13.0 - 17.0 g/dL   HCT 39.7 39.0 - 52.0 %   MCV 87.3 80.0 - 100.0 fL   MCH 27.7 26.0 - 34.0 pg   MCHC 31.7 30.0 - 36.0 g/dL   RDW 14.6 11.5 - 15.5 %   Platelets 279 150 - 400 K/uL   nRBC 0.0 0.0 - 0.2 %   Neutrophils Relative % 87 %   Neutro Abs 14.7 (H) 1.7 - 7.7 K/uL   Lymphocytes Relative 6 %   Lymphs Abs 1.1 0.7 - 4.0 K/uL   Monocytes Relative 6 %   Monocytes Absolute 0.9 0.1 - 1.0 K/uL   Eosinophils Relative 0 %   Eosinophils Absolute 0.0 0.0 - 0.5 K/uL   Basophils Relative 0 %   Basophils Absolute 0.0 0.0 - 0.1 K/uL   Immature Granulocytes 1 %   Abs Immature Granulocytes 0.13 (H) 0.00 - 0.07 K/uL    Comment: Performed at [facility], [place] [address] [place], [place] [address]  Comprehensive metabolic panel     Status: Abnormal    Collection Time: 01/12/21  1:07 PM  Result Value Ref Range   Sodium 137 135 - 145 mmol/L   Potassium 4.0 3.5 - 5.1 mmol/L   Chloride 98 98 - 111 mmol/L   CO2 26 22 - 32 mmol/L   Glucose, Bld 142 (H) 70 - 99 mg/dL    Comment: Glucose reference range applies only to samples taken after fasting for at least 8 hours.   BUN 12 6 - 20 mg/dL   Creatinine, Ser 1.17 0.61 - 1.24 mg/dL   Calcium 9.2 8.9 - 10.3 mg/dL   Total Protein 7.8 6.5 - 8.1 g/dL   Albumin 3.9 3.5 - 5.0 g/dL   AST 22 15 - 41 U/L   ALT 25 0 - 44 U/L   Alkaline Phosphatase 66 38 - 126 U/L   Total Bilirubin 0.7 0.3 - 1.2 mg/dL   GFR, Estimated >60 >60 mL/min    Comment: (NOTE) Calculated using the CKD-EPI Creatinine Equation (2021)    Anion gap 13 5 - 15    Comment: Performed at [facility], [place] [address], [place], [place] [address]  Lipase, blood     Status: None   Collection Time: 01/12/21  1:07 PM  Result Value Ref Range   Lipase 31 11 - 51 U/L    Comment: Performed at [facility], [place] [address], [place], [place] [address]  Urinalysis, Routine w reflex microscopic     Status: Abnormal   Collection Time: 01/12/21  1:29 PM  Result Value Ref Range   Color, Urine ORANGE (A) YELLOW    Comment: BIOCHEMICALS MAY BE AFFECTED BY COLOR   APPearance CLEAR CLEAR   Specific Gravity, Urine 1.020 1.005 - 1.030   pH 7.0 5.0 - 8.0   Glucose, UA NEGATIVE NEGATIVE mg/dL   Hgb urine dipstick TRACE (A) NEGATIVE   Bilirubin Urine NEGATIVE NEGATIVE   Ketones, ur NEGATIVE NEGATIVE mg/dL   Protein, ur TRACE (A) NEGATIVE mg/dL   Nitrite NEGATIVE NEGATIVE   Leukocytes,Ua NEGATIVE NEGATIVE    Comment: Performed at [place] [address], [place], [place] [address]  Urinalysis, Microscopic (reflex)     Status: Abnormal   Collection Time: 01/12/21  1:29 PM  Result Value Ref Range   RBC / HPF 6-10 0 - 5 RBC/hpf  WBC, UA 0-5 0 - 5 WBC/hpf   Bacteria, UA RARE (A) NONE SEEN    Squamous Epithelial / LPF 0-5 0 - 5    Comment: Performed at [facility], [place] [address], [place], [place] [address]   CT ABDOMEN PELVIS W CONTRAST  Result Date: 01/12/2021 CLINICAL DATA:  Right lower quadrant abdominal pain EXAM: CT ABDOMEN AND PELVIS WITH CONTRAST TECHNIQUE: Multidetector CT imaging of the abdomen and pelvis was performed using the standard protocol following bolus administration of intravenous contrast. CONTRAST:  102m OMNIPAQUE IOHEXOL 350 MG/ML SOLN COMPARISON:  None. FINDINGS: Lower chest: Mild bibasilar atelectasis. Visualized heart and pericardium are unremarkable peer Hepatobiliary: Mild hepatic steatosis. No intra or extrahepatic biliary ductal dilation. Gallbladder unremarkable. Pancreas: Unremarkable Spleen: Unremarkable Adrenals/Urinary Tract: Adrenal glands are unremarkable. Kidneys are normal, without renal calculi, focal lesion, or hydronephrosis. Bladder is unremarkable. Stomach/Bowel: The appendix is hyperemic and demonstrates extensive periappendiceal inflammatory stranding in keeping with changes of acute appendicitis. The appendix measures up to 15 mm in diameter. The extensive inflammatory change within the right lower quadrant medial to the cecum surrounding the appendix. There is no appendicoliths identified. No loculated periappendiceal fluid collection or extraluminal gas identified. Small free fluid noted layering along the right pericolic gutter and within the right retroperitoneum. The stomach, small bowel, and large bowel are otherwise unremarkable. No evidence of obstruction. No free intraperitoneal gas. Vascular/Lymphatic: Mild aortoiliac atherosclerotic calcification. No aortic aneurysm. No pathologic adenopathy within the abdomen and pelvis. Reproductive: Prostate is unremarkable. Other: No abdominal wall hernia.  Rectum unremarkable. Musculoskeletal: L4-5 lumbar fusion with instrumentation has been performed as well as resection of the  spinous process of L4. No acute bone abnormality. No lytic or blastic bone lesion identified. IMPRESSION: Acute unruptured appendicitis. Appendix: Location: Medial to cecum Diameter: 15 mm Appendicolith: None Mucosal hyper-enhancement: Present Extraluminal gas: None Periappendiceal collection: None Electronically Signed   By: AFidela SalisburyM.D.   On: 01/12/2021 16:53    Review of Systems  Constitutional:  Positive for appetite change.  HENT:  Negative for ear discharge, ear pain, hearing loss and tinnitus.   Eyes:  Negative for photophobia and pain.  Respiratory:  Negative for cough and shortness of breath.   Cardiovascular:  Negative for chest pain.  Gastrointestinal:  Positive for abdominal pain and nausea. Negative for vomiting.  Genitourinary:  Negative for dysuria, flank pain, frequency and urgency.  Musculoskeletal:  Negative for back pain, myalgias and neck pain.  Neurological:  Negative for dizziness and headaches.  Hematological:  Does not bruise/bleed easily.  Psychiatric/Behavioral:  The patient is not nervous/anxious.    Blood pressure 133/75, pulse 99, temperature 99.4 F (37.4 C), temperature source Oral, resp. rate 18, height '5\' 9"'$  (1.753 m), weight 102.1 kg, SpO2 97 %. Physical Exam   Constitutional:  WDWN in NAD, conversant, no obvious deformities; lying in bed comfortably Eyes:  Pupils equal, round; sclera anicteric; moist conjunctiva; no lid lag HENT:  Oral mucosa moist; good dentition  Neck:  No masses palpated, trachea midline; no thyromegaly Lungs:  CTA bilaterally; normal respiratory effort CV:  Regular rate and rhythm; no murmurs; extremities well-perfused with no edema Abd:  Tender to palpation in mid-abdomen and RLQ; some guarding; no rebound tenderness Musc:  Unable to assess gait; no apparent clubbing or cyanosis in extremities Lymphatic:  No palpable cervical or axillary lymphadenopathy Skin:  Warm, dry; no sign of jaundice Psychiatric - alert and oriented x  4; calm mood and affect  Assessment/Plan Acute  appendicitis - no sign of perforation or abscess  Recommend laparoscopic appendectomy.  After discussion with the OR staff, the earliest we could get to the operating room tonight would be after midnight. Since he is not septic and has no sign of perforation, it is safe to wait until morning for appendectomy.  Dr. Johney Maine will discuss the procedure with him in detail in the morning.  NPO p MN IV antibiotics PRN pain meds IV hydration  Maia Petties, MD 01/12/2021, 5:39 PM

## 2021-01-12 NOTE — ED Provider Notes (Addendum)
**Corey Corey** Corey Corey   CSN: [id number] Arrival date & time: 01/12/21  0847     History Chief Complaint  Patient presents with   Abdominal Pain    Corey Corey is a 55 y.o. male.  Patient with history of umbilical hernia repair as a child, no other abdominal surgical history --presents the emergency department for evaluation of right-sided abdominal pain starting in the mid abdomen yesterday evening and then moving towards the right flank.  Patient with associated nausea and decreased appetite.  He has tried to make himself vomit without success.  He denies fever, chest pain or shortness of breath.  No urinary symptoms.  He has taken ibuprofen last night which helped temporarily.  Symptoms gradually worsening.  Nothing make symptoms better or worse.      Past Medical History:  Diagnosis Date   Arthritis    knee, left   ED (erectile dysfunction)    Hyperlipidemia    Hypertension    Sleep apnea    CPAP - not using lately due to recall     Patient Active Problem List   Diagnosis Date Noted   H/O total knee replacement, left 11/11/2020   Leiomyoma 11/08/2020   Unilateral primary osteoarthritis, left knee 10/17/2020   Arthritis 09/26/2018   Onychomycosis 09/26/2018   Class 1 obesity due to excess calories without serious comorbidity with body mass index (BMI) of 32.0 to 32.9 in adult 09/06/2017   Hyperlipidemia LDL goal <100 08/31/2016   OSA on CPAP 08/31/2016   Premature ejaculation 08/19/2011    Past Surgical History:  Procedure Laterality Date   ANKLE BONE SURGERY     BACK SURGERY     COLLAR BONE SURGEY     COLONOSCOPY     FRACTURE SURGERY     HERNIA REPAIR     KNEE ARTHROSCOPY Left 2006   LUMBAR MICRODISCECTOMY     POLYPECTOMY     TOTAL KNEE ARTHROPLASTY Left 11/11/2020   Procedure: LEFT TOTAL KNEE ARTHROPLASTY;  Surgeon: Marybelle Killings, MD;  Location: [facility];  Service: Orthopedics;  Laterality: Left;   UMBILICAL HERNIA  REPAIR         Family History  Problem Relation Age of Onset   Alzheimer's disease Mother    Stevens-Johnson syndrome Father    Autism Brother    Colon cancer Other    Colon polyps Neg Hx    Esophageal cancer Neg Hx    Rectal cancer Neg Hx    Stomach cancer Neg Hx     Social History   Tobacco Use   Smoking status: Never   Smokeless tobacco: Never  Vaping Use   Vaping Use: Never used  Substance Use Topics   Alcohol use: Yes    Alcohol/week: 3.0 standard drinks    Types: 3 Glasses of wine per week    Comment: occasional   Drug use: Not Currently    Types: Marijuana    Home Medications Prior to Admission medications   Medication Sig Start Date End Date Taking? Authorizing Provider  aspirin EC 325 MG tablet Take 1 tablet (325 mg total) by mouth daily. MUST TAKE AT LEAST 4 WEEKS POSTOP FOR DVT PROPHYLAXIS 11/12/20   Marybelle Killings, MD  methocarbamol (ROBAXIN) 500 MG tablet Take 1 tablet (500 mg total) by mouth every 6 (six) hours as needed for muscle spasms. Patient not taking: No sig reported 11/12/20   Lanae Crumbly, PA-C  Multiple Vitamins-Minerals (MULTIVITAMIN WITH MINERALS) tablet  Take 2 tablets by mouth daily.    [provider]  oxyCODONE-acetaminophen (PERCOCET/ROXICET) 5-325 MG tablet Take 1 tablet by mouth every 4 (four) hours as needed for severe pain. Patient not taking: No sig reported 11/12/20   Marybelle Killings, MD  PARoxetine (PAXIL) 20 MG tablet Take 1 tablet (20 mg total) by mouth daily. TAKE 1 TABLET BY MOUTH EVERY DAY IN THE MORNING 11/12/20   Lanae Crumbly, PA-C  traMADol (ULTRAM) 50 MG tablet Take 1 tablet (50 mg total) by mouth every 6 (six) hours as needed. 12/06/20   Marybelle Killings, MD    Allergies    Penicillins  Review of Systems   Review of Systems  Constitutional:  Negative for fever.  HENT:  Negative for rhinorrhea and sore throat.   Eyes:  Negative for redness.  Respiratory:  Negative for cough.   Cardiovascular:  Negative for chest  pain.  Gastrointestinal:  Positive for abdominal pain and nausea. Negative for diarrhea and vomiting.  Genitourinary:  Positive for flank pain. Negative for dysuria and hematuria.  Musculoskeletal:  Negative for myalgias.  Skin:  Negative for rash.  Neurological:  Negative for headaches.   Physical Exam Updated Vital Signs BP (!) 151/94 (BP Location: Left Arm)   Pulse (!) 120   Temp 99.4 F (37.4 C) (Oral)   Ht '5\' 9"'$  (1.753 m)   Wt 102.1 kg   SpO2 98%   BMI 33.23 kg/m   Physical Exam Vitals and nursing Corey reviewed.  Constitutional:      General: He is not in acute distress.    Appearance: He is well-developed.  HENT:     Head: Normocephalic and atraumatic.  Eyes:     General:        Right eye: No discharge.        Left eye: No discharge.     Conjunctiva/sclera: Conjunctivae normal.  Cardiovascular:     Rate and Rhythm: Regular rhythm. Tachycardia present.     Heart sounds: Normal heart sounds.  Pulmonary:     Effort: Pulmonary effort is normal.     Breath sounds: Normal breath sounds.  Abdominal:     Palpations: Abdomen is soft.     Tenderness: There is no abdominal tenderness (Mild to moderate tenderness, generally, worse in the right lower quadrant and mid abdominal area). There is guarding. There is no rebound. Negative signs include Murphy's sign and McBurney's sign.  Musculoskeletal:     Cervical back: Normal range of motion and neck supple.  Skin:    General: Skin is warm and dry.  Neurological:     Mental Status: He is alert.    ED Results / Procedures / Treatments   Labs (all labs ordered are listed, but only abnormal results are displayed) Labs Reviewed  CBC WITH DIFFERENTIAL/PLATELET - Abnormal; Notable for the following components:      Result Value   WBC 16.8 (*)    Hemoglobin 12.6 (*)    Neutro Abs 14.7 (*)    Abs Immature Granulocytes 0.13 (*)    All other components within normal limits  COMPREHENSIVE METABOLIC PANEL - Abnormal; Notable for  the following components:   Glucose, Bld 142 (*)    All other components within normal limits  URINALYSIS, ROUTINE W REFLEX MICROSCOPIC - Abnormal; Notable for the following components:   Color, Urine ORANGE (*)    Hgb urine dipstick TRACE (*)    Protein, ur TRACE (*)    All other components  within normal limits  URINALYSIS, MICROSCOPIC (REFLEX) - Abnormal; Notable for the following components:   Bacteria, UA RARE (*)    All other components within normal limits  RESP PANEL BY RT-PCR (FLU A&B, COVID) ARPGX2  LIPASE, BLOOD    EKG None  Radiology CT ABDOMEN PELVIS W CONTRAST  Result Date: 01/12/2021 CLINICAL DATA:  Right lower quadrant abdominal pain EXAM: CT ABDOMEN AND PELVIS WITH CONTRAST TECHNIQUE: Multidetector CT imaging of the abdomen and pelvis was performed using the standard protocol following bolus administration of intravenous contrast. CONTRAST:  12m OMNIPAQUE IOHEXOL 350 MG/ML SOLN COMPARISON:  None. FINDINGS: Lower chest: Mild bibasilar atelectasis. Visualized heart and pericardium are unremarkable peer Hepatobiliary: Mild hepatic steatosis. No intra or extrahepatic biliary ductal dilation. Gallbladder unremarkable. Pancreas: Unremarkable Spleen: Unremarkable Adrenals/Urinary Tract: Adrenal glands are unremarkable. Kidneys are normal, without renal calculi, focal lesion, or hydronephrosis. Bladder is unremarkable. Stomach/Bowel: The appendix is hyperemic and demonstrates extensive periappendiceal inflammatory stranding in keeping with changes of acute appendicitis. The appendix measures up to 15 mm in diameter. The extensive inflammatory change within the right lower quadrant medial to the cecum surrounding the appendix. There is no appendicoliths identified. No loculated periappendiceal fluid collection or extraluminal gas identified. Small free fluid noted layering along the right pericolic gutter and within the right retroperitoneum. The stomach, small bowel, and large bowel are  otherwise unremarkable. No evidence of obstruction. No free intraperitoneal gas. Vascular/Lymphatic: Mild aortoiliac atherosclerotic calcification. No aortic aneurysm. No pathologic adenopathy within the abdomen and pelvis. Reproductive: Prostate is unremarkable. Other: No abdominal wall hernia.  Rectum unremarkable. Musculoskeletal: L4-5 lumbar fusion with instrumentation has been performed as well as resection of the spinous process of L4. No acute bone abnormality. No lytic or blastic bone lesion identified. IMPRESSION: Acute unruptured appendicitis. Appendix: Location: Medial to cecum Diameter: 15 mm Appendicolith: None Mucosal hyper-enhancement: Present Extraluminal gas: None Periappendiceal collection: None Electronically Signed   By: AFidela SalisburyM.D.   On: 01/12/2021 16:53    Procedures Procedures   Medications Ordered in ED Medications  ceFEPIme (MAXIPIME) 2 g in sodium chloride 0.9 % 100 mL IVPB (2 g Intravenous New Bag/Given 01/12/21 1714)    And  metroNIDAZOLE (FLAGYL) IVPB 500 mg (has no administration in time range)  sodium chloride 0.9 % bolus 1,000 mL (1,000 mLs Intravenous New Bag/Given 01/12/21 1627)  fentaNYL (SUBLIMAZE) injection 50 mcg (50 mcg Intravenous Given 01/12/21 1627)  iohexol (OMNIPAQUE) 350 MG/ML injection 80 mL (80 mLs Intravenous Contrast Given 01/12/21 1633)    ED Course  I have reviewed the triage vital signs and the nursing notes.  Pertinent labs & imaging results that were available during my care of the patient were reviewed by me and considered in my medical decision making (see chart for details).  Patient seen and examined. Agree with work-up that is pending. Need CT to evaluate for cholecystitis, appendicitis, nephrolithiasis.  Medications ordered previously, pending administration.  Vital signs reviewed and are as follows: BP (!) 151/94 (BP Location: Left Arm)   Pulse (!) 120   Temp 99.4 F (37.4 C) (Oral)   Ht '5\' 9"'$  (1.753 m)   Wt 102.1 kg   SpO2  98%   BMI 33.23 kg/m   CT shows acute appendicitis. No apparent complications. Pt and wife updated. Asked RN to expedite COVID testing.  Reviewed penicillin allergy with patient.  He describes syncopal spell after taking penicillin.  He does not describe anything which would otherwise be suggestive of anaphylaxis.  5:16 PM  Spoke with Dr. Georgette Dover of general surgery who will see.     MDM Rules/Calculators/A&P                           Admit.    Final Clinical Impression(s) / ED Diagnoses Final diagnoses:  Acute appendicitis with localized peritonitis, without perforation, abscess, or gangrene    Rx / DC Orders ED Discharge Orders     None            Carlisle Cater, Hershal Coria 01/12/21 1722    Luna Fuse, MD 01/12/21 2052

## 2021-01-12 NOTE — ED Notes (Signed)
Pt declines changing into gown at this time. Wants to remain in home clothing until room placement. Informed consent signed. Pt declines any additional questions.

## 2021-01-13 ENCOUNTER — Encounter (HOSPITAL_COMMUNITY): Payer: Self-pay

## 2021-01-13 ENCOUNTER — Observation Stay (HOSPITAL_COMMUNITY): Payer: Federal, State, Local not specified - PPO | Admitting: Certified Registered"

## 2021-01-13 ENCOUNTER — Encounter (HOSPITAL_COMMUNITY): Admission: EM | Disposition: A | Discharge status: Home / Self Care | Payer: Self-pay | Source: Home / Self Care

## 2021-01-13 DIAGNOSIS — K3532 Acute appendicitis with perforation and localized peritonitis, without abscess: Secondary | ICD-10-CM | POA: Diagnosis present

## 2021-01-13 DIAGNOSIS — K3533 Acute appendicitis with perforation and localized peritonitis, with abscess: Secondary | ICD-10-CM | POA: Diagnosis not present

## 2021-01-13 DIAGNOSIS — Z800 Family history of malignant neoplasm of digestive organs: Secondary | ICD-10-CM | POA: Diagnosis not present

## 2021-01-13 DIAGNOSIS — I10 Essential (primary) hypertension: Secondary | ICD-10-CM | POA: Diagnosis not present

## 2021-01-13 DIAGNOSIS — E669 Obesity, unspecified: Secondary | ICD-10-CM | POA: Diagnosis present

## 2021-01-13 DIAGNOSIS — K353 Acute appendicitis with localized peritonitis, without perforation or gangrene: Secondary | ICD-10-CM | POA: Diagnosis not present

## 2021-01-13 DIAGNOSIS — K660 Peritoneal adhesions (postprocedural) (postinfection): Secondary | ICD-10-CM | POA: Diagnosis not present

## 2021-01-13 DIAGNOSIS — Z79899 Other long term (current) drug therapy: Secondary | ICD-10-CM | POA: Diagnosis not present

## 2021-01-13 DIAGNOSIS — K3580 Unspecified acute appendicitis: Secondary | ICD-10-CM | POA: Diagnosis present

## 2021-01-13 DIAGNOSIS — G4733 Obstructive sleep apnea (adult) (pediatric): Secondary | ICD-10-CM | POA: Diagnosis not present

## 2021-01-13 DIAGNOSIS — Z7982 Long term (current) use of aspirin: Secondary | ICD-10-CM | POA: Diagnosis not present

## 2021-01-13 DIAGNOSIS — Z96652 Presence of left artificial knee joint: Secondary | ICD-10-CM | POA: Diagnosis present

## 2021-01-13 DIAGNOSIS — Z6833 Body mass index (BMI) 33.0-33.9, adult: Secondary | ICD-10-CM | POA: Diagnosis not present

## 2021-01-13 DIAGNOSIS — G4730 Sleep apnea, unspecified: Secondary | ICD-10-CM | POA: Diagnosis present

## 2021-01-13 DIAGNOSIS — Z9119 Patient's noncompliance with other medical treatment and regimen: Secondary | ICD-10-CM | POA: Diagnosis not present

## 2021-01-13 DIAGNOSIS — E785 Hyperlipidemia, unspecified: Secondary | ICD-10-CM | POA: Diagnosis not present

## 2021-01-13 DIAGNOSIS — Z20822 Contact with and (suspected) exposure to covid-19: Secondary | ICD-10-CM | POA: Diagnosis present

## 2021-01-13 HISTORY — PX: LAPAROSCOPIC APPENDECTOMY: SHX408

## 2021-01-13 LAB — HIV ANTIBODY (ROUTINE TESTING W REFLEX): HIV Screen 4th Generation wRfx: NONREACTIVE

## 2021-01-13 SURGERY — APPENDECTOMY, LAPAROSCOPIC
Anesthesia: General | Site: Abdomen

## 2021-01-13 MED ORDER — DEXAMETHASONE SODIUM PHOSPHATE 10 MG/ML IJ SOLN
INTRAMUSCULAR | Status: DC | PRN
  Administered 2021-01-13: 4 mg via INTRAVENOUS

## 2021-01-13 MED ORDER — STERILE WATER FOR IRRIGATION IR SOLN
Status: DC | PRN
  Administered 2021-01-13: 1000 mL

## 2021-01-13 MED ORDER — BUPIVACAINE-EPINEPHRINE (PF) 0.25% -1:200000 IJ SOLN
INTRAMUSCULAR | Status: AC
  Filled 2021-01-13: qty 30

## 2021-01-13 MED ORDER — PROPOFOL 10 MG/ML IV BOLUS
INTRAVENOUS | Status: DC | PRN
  Administered 2021-01-13: 200 mg via INTRAVENOUS

## 2021-01-13 MED ORDER — LACTATED RINGERS IR SOLN
Status: DC | PRN
  Administered 2021-01-13: 1000 mL

## 2021-01-13 MED ORDER — GLYCOPYRROLATE 0.2 MG/ML IJ SOLN
INTRAMUSCULAR | Status: AC
  Filled 2021-01-13: qty 1

## 2021-01-13 MED ORDER — SODIUM CHLORIDE 0.9 % IV SOLN
2.0000 g | Freq: Three times a day (TID) | INTRAVENOUS | Status: DC
  Administered 2021-01-13 – 2021-01-14 (×3): 2 g via INTRAVENOUS
  Filled 2021-01-13 (×4): qty 2

## 2021-01-13 MED ORDER — METHOCARBAMOL 1000 MG/10ML IJ SOLN
1000.0000 mg | Freq: Four times a day (QID) | INTRAMUSCULAR | Status: DC | PRN
Start: 2021-01-13 — End: 2021-01-13

## 2021-01-13 MED ORDER — ENOXAPARIN SODIUM 40 MG/0.4ML IJ SOSY: 40.0000 mg | PREFILLED_SYRINGE | INTRAMUSCULAR | Status: DC

## 2021-01-13 MED ORDER — HYDROMORPHONE HCL 1 MG/ML IJ SOLN: 0.5000 mg | INTRAMUSCULAR | Status: DC | PRN

## 2021-01-13 MED ORDER — METRONIDAZOLE 500 MG/100ML IV SOLN
500.0000 mg | Freq: Once | INTRAVENOUS | Status: AC
  Filled 2021-01-13: qty 100

## 2021-01-13 MED ORDER — MIDAZOLAM HCL 2 MG/2ML IJ SOLN
INTRAMUSCULAR | Status: DC | PRN
  Administered 2021-01-13: 2 mg via INTRAVENOUS

## 2021-01-13 MED ORDER — SODIUM CHLORIDE 0.9% FLUSH
3.0000 mL | Freq: Two times a day (BID) | INTRAVENOUS | Status: DC
  Administered 2021-01-13 – 2021-01-14 (×2): 3 mL via INTRAVENOUS

## 2021-01-13 MED ORDER — POTASSIUM CHLORIDE IN NACL 20-0.9 MEQ/L-% IV SOLN
INTRAVENOUS | Status: DC
  Filled 2021-01-13 (×2): qty 1000

## 2021-01-13 MED ORDER — METRONIDAZOLE 500 MG/100ML IV SOLN
INTRAVENOUS | Status: AC
  Administered 2021-01-13: 500 mg via INTRAVENOUS
  Filled 2021-01-13: qty 100

## 2021-01-13 MED ORDER — SUCCINYLCHOLINE CHLORIDE 200 MG/10ML IV SOSY
PREFILLED_SYRINGE | INTRAVENOUS | Status: DC | PRN
  Administered 2021-01-13: 140 mg via INTRAVENOUS

## 2021-01-13 MED ORDER — FENTANYL CITRATE (PF) 100 MCG/2ML IJ SOLN
INTRAMUSCULAR | Status: AC
  Filled 2021-01-13: qty 2

## 2021-01-13 MED ORDER — OXYCODONE HCL 5 MG PO TABS: 5.0000 mg | ORAL_TABLET | ORAL | Status: DC | PRN

## 2021-01-13 MED ORDER — ONDANSETRON HCL 4 MG/2ML IJ SOLN
4.0000 mg | Freq: Four times a day (QID) | INTRAMUSCULAR | Status: DC | PRN
  Filled 2021-01-13: qty 2

## 2021-01-13 MED ORDER — ACETAMINOPHEN 500 MG PO TABS
1000.0000 mg | ORAL_TABLET | Freq: Four times a day (QID) | ORAL | Status: DC
  Administered 2021-01-13 – 2021-01-14 (×3): 1000 mg via ORAL
  Filled 2021-01-13 (×3): qty 2

## 2021-01-13 MED ORDER — OXYCODONE HCL 5 MG/5ML PO SOLN
5.0000 mg | Freq: Once | ORAL | Status: DC | PRN
Start: 2021-01-13 — End: 2021-01-13

## 2021-01-13 MED ORDER — SUGAMMADEX SODIUM 200 MG/2ML IV SOLN
INTRAVENOUS | Status: DC | PRN
  Administered 2021-01-13: 200 mg via INTRAVENOUS

## 2021-01-13 MED ORDER — ONDANSETRON HCL 4 MG/2ML IJ SOLN
INTRAMUSCULAR | Status: DC | PRN
  Administered 2021-01-13: 4 mg via INTRAVENOUS

## 2021-01-13 MED ORDER — ONDANSETRON 4 MG PO TBDP: 4.0000 mg | ORAL_TABLET | Freq: Four times a day (QID) | ORAL | Status: DC | PRN

## 2021-01-13 MED ORDER — SODIUM CHLORIDE 0.9 % IV SOLN
2.0000 g | INTRAVENOUS | Status: DC
  Administered 2021-01-13: 2 g via INTRAVENOUS
  Filled 2021-01-13: qty 20

## 2021-01-13 MED ORDER — ACETAMINOPHEN 500 MG PO TABS
1000.0000 mg | ORAL_TABLET | ORAL | Status: AC
  Administered 2021-01-13: 1000 mg via ORAL
  Filled 2021-01-13: qty 2

## 2021-01-13 MED ORDER — METHOCARBAMOL 500 MG PO TABS: 1000.0000 mg | ORAL_TABLET | Freq: Four times a day (QID) | ORAL | Status: DC | PRN

## 2021-01-13 MED ORDER — ENALAPRILAT 1.25 MG/ML IV SOLN
0.6250 mg | Freq: Four times a day (QID) | INTRAVENOUS | Status: DC | PRN
  Filled 2021-01-13: qty 1

## 2021-01-13 MED ORDER — DIPHENHYDRAMINE HCL 25 MG PO CAPS: 25.0000 mg | ORAL_CAPSULE | Freq: Four times a day (QID) | ORAL | Status: DC | PRN

## 2021-01-13 MED ORDER — GABAPENTIN 300 MG PO CAPS
300.0000 mg | ORAL_CAPSULE | Freq: Every day | ORAL | Status: DC
  Administered 2021-01-13: 300 mg via ORAL
  Filled 2021-01-13: qty 1

## 2021-01-13 MED ORDER — METRONIDAZOLE 500 MG/100ML IV SOLN
500.0000 mg | Freq: Two times a day (BID) | INTRAVENOUS | Status: DC
Start: 2021-01-14 — End: 2021-01-14
  Administered 2021-01-14: 500 mg via INTRAVENOUS
  Filled 2021-01-13: qty 100

## 2021-01-13 MED ORDER — MORPHINE SULFATE (PF) 2 MG/ML IV SOLN
2.0000 mg | INTRAVENOUS | Status: DC | PRN
  Filled 2021-01-13: qty 2

## 2021-01-13 MED ORDER — DIPHENHYDRAMINE HCL 50 MG/ML IJ SOLN: 12.5000 mg | Freq: Four times a day (QID) | INTRAMUSCULAR | Status: DC | PRN

## 2021-01-13 MED ORDER — OXYCODONE HCL 5 MG PO TABS
5.0000 mg | ORAL_TABLET | Freq: Once | ORAL | Status: DC | PRN
Start: 2021-01-13 — End: 2021-01-13

## 2021-01-13 MED ORDER — CELECOXIB 200 MG PO CAPS
400.0000 mg | ORAL_CAPSULE | ORAL | Status: AC
  Administered 2021-01-13: 400 mg via ORAL
  Filled 2021-01-13: qty 2

## 2021-01-13 MED ORDER — CALCIUM POLYCARBOPHIL 625 MG PO TABS
625.0000 mg | ORAL_TABLET | Freq: Two times a day (BID) | ORAL | Status: DC
  Administered 2021-01-13 – 2021-01-14 (×2): 625 mg via ORAL
  Filled 2021-01-13 (×2): qty 1

## 2021-01-13 MED ORDER — MIDAZOLAM HCL 2 MG/2ML IJ SOLN
INTRAMUSCULAR | Status: AC
  Filled 2021-01-13: qty 2

## 2021-01-13 MED ORDER — SUCCINYLCHOLINE CHLORIDE 200 MG/10ML IV SOSY
PREFILLED_SYRINGE | INTRAVENOUS | Status: AC
  Filled 2021-01-13: qty 10

## 2021-01-13 MED ORDER — LACTATED RINGERS IV BOLUS
1000.0000 mL | Freq: Three times a day (TID) | INTRAVENOUS | Status: DC | PRN
Start: 2021-01-13 — End: 2021-01-14

## 2021-01-13 MED ORDER — METOPROLOL TARTRATE 5 MG/5ML IV SOLN: 5.0000 mg | Freq: Four times a day (QID) | INTRAVENOUS | Status: DC | PRN

## 2021-01-13 MED ORDER — LIDOCAINE 2% (20 MG/ML) 5 ML SYRINGE
INTRAMUSCULAR | Status: DC | PRN
  Administered 2021-01-13: 60 mg via INTRAVENOUS

## 2021-01-13 MED ORDER — HYDROMORPHONE HCL 1 MG/ML IJ SOLN: 0.2500 mg | INTRAMUSCULAR | Status: DC | PRN

## 2021-01-13 MED ORDER — MAGIC MOUTHWASH
15.0000 mL | Freq: Four times a day (QID) | ORAL | Status: DC | PRN
  Filled 2021-01-13: qty 15

## 2021-01-13 MED ORDER — METRONIDAZOLE 500 MG/100ML IV SOLN
500.0000 mg | Freq: Two times a day (BID) | INTRAVENOUS | Status: DC
Start: 2021-01-13 — End: 2021-01-13
  Administered 2021-01-13 (×2): 500 mg via INTRAVENOUS
  Filled 2021-01-13: qty 100

## 2021-01-13 MED ORDER — ROCURONIUM BROMIDE 10 MG/ML (PF) SYRINGE
PREFILLED_SYRINGE | INTRAVENOUS | Status: DC | PRN
  Administered 2021-01-13: 30 mg via INTRAVENOUS
  Administered 2021-01-13: 10 mg via INTRAVENOUS

## 2021-01-13 MED ORDER — SODIUM CHLORIDE 0.9% FLUSH: 3.0000 mL | INTRAVENOUS | Status: DC | PRN

## 2021-01-13 MED ORDER — 0.9 % SODIUM CHLORIDE (POUR BTL) OPTIME
TOPICAL | Status: DC | PRN
  Administered 2021-01-13: 1000 mL

## 2021-01-13 MED ORDER — BUPIVACAINE-EPINEPHRINE 0.25% -1:200000 IJ SOLN
INTRAMUSCULAR | Status: DC | PRN
  Administered 2021-01-13: 30 mL

## 2021-01-13 MED ORDER — GABAPENTIN 300 MG PO CAPS
300.0000 mg | ORAL_CAPSULE | ORAL | Status: AC
  Administered 2021-01-13: 300 mg via ORAL
  Filled 2021-01-13: qty 1

## 2021-01-13 MED ORDER — DIPHENHYDRAMINE HCL 50 MG/ML IJ SOLN: 25.0000 mg | Freq: Four times a day (QID) | INTRAMUSCULAR | Status: DC | PRN

## 2021-01-13 MED ORDER — ONDANSETRON HCL 4 MG/2ML IJ SOLN: 4.0000 mg | Freq: Once | INTRAMUSCULAR | Status: DC | PRN

## 2021-01-13 MED ORDER — PAROXETINE HCL 20 MG PO TABS: 20.0000 mg | ORAL_TABLET | Freq: Every day | ORAL | Status: DC

## 2021-01-13 MED ORDER — SODIUM CHLORIDE 0.9 % IV SOLN: 250.0000 mL | INTRAVENOUS | Status: DC | PRN

## 2021-01-13 MED ORDER — FENTANYL CITRATE (PF) 250 MCG/5ML IJ SOLN
INTRAMUSCULAR | Status: DC | PRN
  Administered 2021-01-13: 100 ug via INTRAVENOUS
  Administered 2021-01-13: 50 ug via INTRAVENOUS
  Administered 2021-01-13: 25 ug via INTRAVENOUS
  Administered 2021-01-13: 50 ug via INTRAVENOUS

## 2021-01-13 MED ORDER — LACTATED RINGERS IV SOLN
INTRAVENOUS | Status: DC
  Administered 2021-01-13: 1000 mL via INTRAVENOUS

## 2021-01-13 MED ORDER — METHOCARBAMOL 1000 MG/10ML IJ SOLN
1000.0000 mg | Freq: Four times a day (QID) | INTRAVENOUS | Status: DC | PRN
  Filled 2021-01-13: qty 10

## 2021-01-13 MED ORDER — LIP MEDEX EX OINT
1.0000 "application " | TOPICAL_OINTMENT | Freq: Two times a day (BID) | CUTANEOUS | Status: DC
  Administered 2021-01-13 – 2021-01-14 (×3): 1 via TOPICAL
  Filled 2021-01-13: qty 7

## 2021-01-13 SURGICAL SUPPLY — 56 items
APL PRP STRL LF DISP 70% ISPRP (MISCELLANEOUS) ×1
APPLIER CLIP 5 13 M/L LIGAMAX5 (MISCELLANEOUS)
APPLIER CLIP ROT 10 11.4 M/L (STAPLE)
APR CLP MED LRG 11.4X10 (STAPLE)
APR CLP MED LRG 5 ANG JAW (MISCELLANEOUS)
BAG COUNTER SPONGE SURGICOUNT (BAG) IMPLANT
BAG SPEC RTRVL 10 TROC 200 (ENDOMECHANICALS) ×1
BAG SPNG CNTER NS LX DISP (BAG)
CABLE HIGH FREQUENCY MONO STRZ (ELECTRODE) ×2 IMPLANT
CHLORAPREP W/TINT 26 (MISCELLANEOUS) ×2 IMPLANT
CLIP APPLIE 5 13 M/L LIGAMAX5 (MISCELLANEOUS) IMPLANT
CLIP APPLIE ROT 10 11.4 M/L (STAPLE) IMPLANT
COVER SURGICAL LIGHT HANDLE (MISCELLANEOUS) ×2 IMPLANT
DECANTER SPIKE VIAL GLASS SM (MISCELLANEOUS) ×2 IMPLANT
DEVICE TROCAR PUNCTURE CLOSURE (ENDOMECHANICALS) IMPLANT
DRAPE LAPAROSCOPIC ABDOMINAL (DRAPES) ×2 IMPLANT
DRAPE WARM FLUID 44X44 (DRAPES) ×2 IMPLANT
DRSG TEGADERM 2-3/8X2-3/4 SM (GAUZE/BANDAGES/DRESSINGS) ×4 IMPLANT
DRSG TEGADERM 4X4.75 (GAUZE/BANDAGES/DRESSINGS) ×2 IMPLANT
ELECT REM PT RETURN 15FT ADLT (MISCELLANEOUS) ×2 IMPLANT
ENDOLOOP SUT PDS II  0 18 (SUTURE)
ENDOLOOP SUT PDS II 0 18 (SUTURE) IMPLANT
GAUZE SPONGE 2X2 8PLY STRL LF (GAUZE/BANDAGES/DRESSINGS) ×1 IMPLANT
GLOVE SURG NEOPR MICRO LF SZ8 (GLOVE) ×2 IMPLANT
GLOVE SURG UNDER LTX SZ8 (GLOVE) ×2 IMPLANT
GOWN STRL REUS W/TWL XL LVL3 (GOWN DISPOSABLE) ×4 IMPLANT
IRRIG SUCT STRYKERFLOW 2 WTIP (MISCELLANEOUS) ×2
IRRIGATION SUCT STRKRFLW 2 WTP (MISCELLANEOUS) ×1 IMPLANT
KIT BASIN OR (CUSTOM PROCEDURE TRAY) ×2 IMPLANT
KIT TURNOVER KIT A (KITS) ×2 IMPLANT
PAD POSITIONING PINK XL (MISCELLANEOUS) ×2 IMPLANT
PENCIL SMOKE EVACUATOR (MISCELLANEOUS) IMPLANT
POUCH RETRIEVAL ECOSAC 10 (ENDOMECHANICALS) ×1 IMPLANT
POUCH RETRIEVAL ECOSAC 10MM (ENDOMECHANICALS) ×2
RELOAD STAPLE 60 3.6 BLU REG (STAPLE) IMPLANT
RELOAD STAPLE 60 4.1 GRN THCK (STAPLE) IMPLANT
RELOAD STAPLER BLUE 60MM (STAPLE) IMPLANT
RELOAD STAPLER GREEN 60MM (STAPLE) ×2 IMPLANT
SCISSORS LAP 5X35 DISP (ENDOMECHANICALS) ×2 IMPLANT
SET TUBE SMOKE EVAC HIGH FLOW (TUBING) ×2 IMPLANT
SHEARS HARMONIC ACE PLUS 36CM (ENDOMECHANICALS) ×1 IMPLANT
SLEEVE XCEL OPT CAN 5 100 (ENDOMECHANICALS) ×2 IMPLANT
SPONGE GAUZE 2X2 STER 10/PKG (GAUZE/BANDAGES/DRESSINGS) ×1
STAPLE ECHEON FLEX 60 POW ENDO (STAPLE) ×1 IMPLANT
STAPLER RELOAD BLUE 60MM (STAPLE)
STAPLER RELOAD GREEN 60MM (STAPLE) ×4
SUT MNCRL AB 4-0 PS2 18 (SUTURE) ×2 IMPLANT
SUT PDS AB 0 CT1 36 (SUTURE) IMPLANT
SUT PDS AB 1 CT1 27 (SUTURE) IMPLANT
SUT SILK 2 0 SH (SUTURE) IMPLANT
TOWEL OR 17X26 10 PK STRL BLUE (TOWEL DISPOSABLE) ×2 IMPLANT
TRAY FOLEY MTR SLVR 14FR STAT (SET/KITS/TRAYS/PACK) ×1 IMPLANT
TRAY FOLEY MTR SLVR 16FR STAT (SET/KITS/TRAYS/PACK) ×1 IMPLANT
TRAY LAPAROSCOPIC (CUSTOM PROCEDURE TRAY) ×2 IMPLANT
TROCAR BLADELESS OPT 5 100 (ENDOMECHANICALS) ×2 IMPLANT
TROCAR XCEL 12X100 BLDLESS (ENDOMECHANICALS) ×2 IMPLANT

## 2021-01-13 NOTE — Discharge Instructions (Addendum)
LAPAROSCOPIC SURGERY: POST OP INSTRUCTIONS  ######################################################################  EAT Gradually transition to a high fiber diet with a fiber supplement over the next few weeks after discharge.  Start with a pureed / full liquid diet (see below)  WALK Walk an hour a day.  Control your pain to do that.    CONTROL PAIN Control pain so that you can walk, sleep, tolerate sneezing/coughing, go up/down stairs.  HAVE A BOWEL MOVEMENT DAILY Keep your bowels regular to avoid problems.  OK to try a laxative to override constipation.  OK to use an antidairrheal to slow down diarrhea.  Call if not better after 2 tries  CALL IF YOU HAVE PROBLEMS/CONCERNS Call if you are still struggling despite following these instructions. Call if you have concerns not answered by these instructions  ######################################################################    DIET: Follow a light bland diet & liquids the first 24 hours after arrival home, such as soup, liquids, starches, etc.  Be sure to drink plenty of fluids.  Quickly advance to a usual solid diet within a few days.  Avoid fast food or heavy meals as your are more likely to get nauseated or have irregular bowels.  A low-fat, high-fiber diet for the rest of your life is ideal.  Take your usually prescribed home medications unless otherwise directed.  PAIN CONTROL: Pain is best controlled by a usual combination of three different methods TOGETHER: Ice/Heat Over the counter pain medication Prescription pain medication Most patients will experience some swelling and bruising around the incisions.  Ice packs or heating pads (30-60 minutes up to 6 times a day) will help. Use ice for the first few days to help decrease swelling and bruising, then switch to heat to help relax tight/sore spots and speed recovery.  Some people prefer to use ice alone, heat alone, alternating between ice & heat.  Experiment to what works for  you.  Swelling and bruising can take several weeks to resolve.   It is helpful to take an over-the-counter pain medication regularly for the first few weeks.  Choose one of the following that works best for you: Naproxen (Aleve, etc)  Two '220mg'$  tabs twice a day Ibuprofen (Advil, etc) Three '200mg'$  tabs four times a day (every meal & bedtime) Acetaminophen (Tylenol, etc) 500-'650mg'$  four times a day (every meal & bedtime) A  prescription for pain medication (such as oxycodone, hydrocodone, tramadol, gabapentin, methocarbamol, etc) should be given to you upon discharge.  Take your pain medication as prescribed.  If you are having problems/concerns with the prescription medicine (does not control pain, nausea, vomiting, rash, itching, etc), please call us [phone] to see if we need to switch you to a different pain medicine that will work better for you and/or control your side effect better. If you need a refill on your pain medication, please give [place] 48 hour notice.  contact your pharmacy.  They will contact our office to request authorization. Prescriptions will not be filled after 5 pm or on week-ends  Avoid getting constipated.   Between the surgery and the pain medications, it is common to experience some constipation.   Increasing fluid intake and taking a fiber supplement (such as Metamucil, Citrucel, FiberCon, MiraLax, etc) 1-2 times a day regularly will usually help prevent this problem from occurring.   A mild laxative (prune juice, Milk of Magnesia, MiraLax, etc) should be taken according to package directions if there are no bowel movements after 48 hours.   Watch out for diarrhea.  If you have many loose bowel movements, simplify your diet to bland foods & liquids for a few days.   Stop any stool softeners and decrease your fiber supplement.   Switching to mild anti-diarrheal medications (Kayopectate, Pepto Bismol) can help.   If this worsens or does not improve, please call  us.  Wash / shower every day.  You may shower over the dressings as they are waterproof.  Continue to shower over incision(s) after the dressing is off.  REMOVE ALL DRESSINGS: Remove your waterproof bandages (tegaderm clear band-aids, steristrip skin tapes, etc) THREE DAYS AFTER SURGERY.  You may leave the incisions open to air.  You may replace a dressing/Band-Aid to cover the incision for comfort if you wish.   ACTIVITIES as tolerated:   You may resume regular (light) daily activities beginning the next day--such as daily self-care, walking, climbing stairs--gradually increasing activities as tolerated.  If you can walk 30 minutes without difficulty, it is safe to try more intense activity such as jogging, treadmill, bicycling, low-impact aerobics, swimming, etc. Save the most intensive and strenuous activity for last such as sit-ups, heavy lifting, contact sports, etc  Refrain from any heavy lifting or straining until you are off narcotics for pain control.   DO NOT PUSH THROUGH PAIN.  Let pain be your guide: If it hurts to do something, don't do it.  Pain is your body warning you to avoid that activity for another week until the pain goes down. You may drive when you are no longer taking prescription pain medication, you can comfortably wear a seatbelt, and you can safely maneuver your car and apply brakes. You may have sexual intercourse when it is comfortable.  FOLLOW UP in our office Please call [facility] at [phone] to set up an appointment to see your surgeon in the office for a follow-up appointment approximately 2-3 weeks after your surgery. Make sure that you call for this appointment the day you arrive home to insure a convenient appointment time.  10. IF YOU HAVE DISABILITY OR FAMILY LEAVE FORMS, BRING THEM TO THE OFFICE FOR PROCESSING.  DO NOT GIVE THEM TO YOUR DOCTOR.   WHEN TO CALL us [phone]: Poor pain control Reactions / problems with new medications (rash/itching,  nausea, etc)  Fever over 101.5 F (38.5 C) Inability to urinate Nausea and/or vomiting Worsening swelling or bruising Continued bleeding from incision. Increased pain, redness, or drainage from the incision   The clinic staff is available to answer your questions during regular business hours (8:30am-5pm).  Please don't hesitate to call and ask to speak to one of our nurses for clinical concerns.   If you have a medical emergency, go to the nearest emergency room or call 911.  A surgeon from [facility] is always on call at the [facility], [place], [place], [place], [place]  [id number] ? MAIN: [phone] ? TOLL FREE: [phone] ?  FAX [phone] [email or web address]   ###########################################      Managing Your Pain After Surgery Without Opioids    Thank you for participating in our program to help patients manage their pain after surgery without opioids. This is part of our effort to provide you with the best care possible, without exposing you or your family to the risk that opioids pose.  What pain can I expect after surgery? You can expect to have some pain after surgery. This is normal. The pain is typically worse  the day after surgery, and quickly begins to get better. Many studies have found that many patients are able to manage their pain after surgery with Over-the-Counter (OTC) medications such as Tylenol and Motrin. If you have a condition that does not allow you to take Tylenol or Motrin, notify your surgical team.  How will I manage my pain? The best strategy for controlling your pain after surgery is around the clock pain control with Tylenol (acetaminophen) and Motrin (ibuprofen or Advil). Alternating these medications with each other allows you to maximize your pain control. In addition to Tylenol and Motrin, you can use heating pads or ice packs on your incisions to help  reduce your pain.  How will I alternate your regular strength over-the-counter pain medication? You will take a dose of pain medication every three hours. Start by taking 650 mg of Tylenol (2 pills of 325 mg) 3 hours later take 600 mg of Motrin (3 pills of 200 mg) 3 hours after taking the Motrin take 650 mg of Tylenol 3 hours after that take 600 mg of Motrin.   - 1 -  See example - if your first dose of Tylenol is at 12:00 PM   12:00 PM Tylenol 650 mg (2 pills of 325 mg)  3:00 PM Motrin 600 mg (3 pills of 200 mg)  6:00 PM Tylenol 650 mg (2 pills of 325 mg)  9:00 PM Motrin 600 mg (3 pills of 200 mg)  Continue alternating every 3 hours   We recommend that you follow this schedule around-the-clock for at least 3 days after surgery, or until you feel that it is no longer needed. Use the table on the last page of this handout to keep track of the medications you are taking. Important: Do not take more than '3000mg'$  of Tylenol or '3200mg'$  of Motrin in a 24-hour period. Do not take ibuprofen/Motrin if you have a history of bleeding stomach ulcers, severe kidney disease, &/or actively taking a blood thinner  What if I still have pain? If you have pain that is not controlled with the over-the-counter pain medications (Tylenol and Motrin or Advil) you might have what we call "breakthrough" pain. You will receive a prescription for a small amount of an opioid pain medication such as Oxycodone, Tramadol, or Tylenol with Codeine. Use these opioid pills in the first 24 hours after surgery if you have breakthrough pain. Do not take more than 1 pill every 4-6 hours.  If you still have uncontrolled pain after using all opioid pills, don't hesitate to call our staff using the number provided. We will help make sure you are managing your pain in the best way possible, and if necessary, we can provide a prescription for additional pain medication.   Day 1    Time  Name of Medication Number of pills taken   Amount of Acetaminophen  Pain Level   Comments  AM PM       AM PM       AM PM       AM PM       AM PM       AM PM       AM PM       AM PM       Total Daily amount of Acetaminophen Do not take more than  3,000 mg per day      Day 2    Time  Name of Medication Number of pills taken  Amount of  Acetaminophen  Pain Level   Comments  AM PM       AM PM       AM PM       AM PM       AM PM       AM PM       AM PM       AM PM       Total Daily amount of Acetaminophen Do not take more than  3,000 mg per day      Day 3    Time  Name of Medication Number of pills taken  Amount of Acetaminophen  Pain Level   Comments  AM PM       AM PM       AM PM       AM PM          AM PM       AM PM       AM PM       AM PM       Total Daily amount of Acetaminophen Do not take more than  3,000 mg per day      Day 4    Time  Name of Medication Number of pills taken  Amount of Acetaminophen  Pain Level   Comments  AM PM       AM PM       AM PM       AM PM       AM PM       AM PM       AM PM       AM PM       Total Daily amount of Acetaminophen Do not take more than  3,000 mg per day      Day 5    Time  Name of Medication Number of pills taken  Amount of Acetaminophen  Pain Level   Comments  AM PM       AM PM       AM PM       AM PM       AM PM       AM PM       AM PM       AM PM       Total Daily amount of Acetaminophen Do not take more than  3,000 mg per day       Day 6    Time  Name of Medication Number of pills taken  Amount of Acetaminophen  Pain Level  Comments  AM PM       AM PM       AM PM       AM PM       AM PM       AM PM       AM PM       AM PM       Total Daily amount of Acetaminophen Do not take more than  3,000 mg per day      Day 7    Time  Name of Medication Number of pills taken  Amount of Acetaminophen  Pain Level   Comments  AM PM       AM PM       AM PM       AM PM       AM PM       AM PM  AM PM       AM PM       Total Daily amount of Acetaminophen Do not take more than  3,000 mg per day        For additional information about how and where to safely dispose of unused opioid medications - [employer]  Disclaimer: This document contains information and/or instructional materials adapted from [place] [facility] for the typical patient with your condition. It does not replace medical advice from your health care provider because your experience may differ from that of the typical patient. Talk to your health care provider if you have any questions about this document, your condition or your treatment plan. Adapted from [place]

## 2021-01-13 NOTE — Anesthesia Preprocedure Evaluation (Addendum)
Anesthesia Evaluation  Patient identified by MRN, date of birth, ID band Patient awake    Reviewed: Allergy & Precautions, NPO status , Patient's Chart, lab work & pertinent test results, reviewed documented beta blocker date and time   Airway Mallampati: III  TM Distance: >3 FB Neck ROM: Full    Dental  (+) Teeth Intact, Dental Advisory Given, Missing, Caps,    Pulmonary sleep apnea ,  Non compliant with CPAP   Pulmonary exam normal breath sounds clear to auscultation       Cardiovascular hypertension, Normal cardiovascular exam Rhythm:Regular Rate:Normal     Neuro/Psych negative neurological ROS  negative psych ROS   GI/Hepatic Neg liver ROS, Acute appendicitis   Endo/Other  Obesity Hyperlipidemia  Renal/GU negative Renal ROS  negative genitourinary   Musculoskeletal  (+) Arthritis , Osteoarthritis,  S/P left TKR 10/2020   Abdominal (+) + obese,  Abdomen: tender.    Peds  Hematology negative hematology ROS (+)   Anesthesia Other Findings   Reproductive/Obstetrics                            Anesthesia Physical Anesthesia Plan  ASA: 2  Anesthesia Plan: General   Post-op Pain Management:    Induction: Intravenous, Cricoid pressure planned and Rapid sequence  PONV Risk Score and Plan: 4 or greater and Treatment may vary due to age or medical condition, Midazolam, Ondansetron and Dexamethasone  Airway Management Planned: Oral ETT  Additional Equipment:   Intra-op Plan:   Post-operative Plan: Extubation in OR  Informed Consent: I have reviewed the patients History and Physical, chart, labs and discussed the procedure including the risks, benefits and alternatives for the proposed anesthesia with the patient or authorized representative who has indicated his/her understanding and acceptance.     Dental advisory given  Plan Discussed with: CRNA and  Anesthesiologist  Anesthesia Plan Comments:         Anesthesia Quick Evaluation

## 2021-01-13 NOTE — Transfer of Care (Signed)
Immediate Anesthesia Transfer of Care Note  Patient: Corey [name]  Procedure(s) Performed: APPENDECTOMY LAPAROSCOPIC WITH LYSIS OF ADHESIONS (Abdomen)  Patient Location: PACU  Anesthesia Type:General  Level of Consciousness: awake, drowsy and patient cooperative  Airway & Oxygen Therapy: Patient Spontanous Breathing and Patient connected to face mask oxygen  Post-op Assessment: Report given to RN and Post -op Vital signs reviewed and stable  Post vital signs: Reviewed and stable  Last Vitals:  Vitals Value Taken Time  BP 130/77 01/13/21 1115  Temp    Pulse 101 01/13/21 1116  Resp 22 01/13/21 1116  SpO2 100 % 01/13/21 1116  Vitals shown include unvalidated device data.  Last Pain:  Vitals:   01/13/21 0819  TempSrc: Oral  PainSc:          Complications: No notable events documented.

## 2021-01-13 NOTE — Anesthesia Procedure Notes (Signed)
Procedure Name: Intubation Date/Time: 01/13/2021 9:58 AM Performed by: Eben Burow, CRNA Pre-anesthesia Checklist: Patient identified, Emergency Drugs available, Suction available, Patient being monitored and Timeout performed Patient Re-evaluated:Patient Re-evaluated prior to induction Oxygen Delivery Method: Circle system utilized Preoxygenation: Pre-oxygenation with 100% oxygen Induction Type: IV induction, Rapid sequence and Cricoid Pressure applied Laryngoscope Size: Mac and 4 Grade View: Grade I Tube type: Oral Tube size: 7.5 mm Number of attempts: 1 Airway Equipment and Method: Stylet Placement Confirmation: ETT inserted through vocal cords under direct vision, positive ETCO2 and breath sounds checked- equal and bilateral Secured at: 23 cm Tube secured with: Tape Dental Injury: Teeth and Oropharynx as per pre-operative assessment

## 2021-01-13 NOTE — Anesthesia Postprocedure Evaluation (Signed)
Anesthesia Post Note  Patient: Corey [name]  Procedure(s) Performed: APPENDECTOMY LAPAROSCOPIC WITH LYSIS OF ADHESIONS (Abdomen)     Patient location during evaluation: PACU Anesthesia Type: General Level of consciousness: awake and alert and oriented Pain management: pain level controlled Vital Signs Assessment: post-procedure vital signs reviewed and stable Respiratory status: spontaneous breathing, nonlabored ventilation and respiratory function stable Cardiovascular status: blood pressure returned to baseline and stable Postop Assessment: no apparent nausea or vomiting Anesthetic complications: no   No notable events documented.  Last Vitals:  Vitals:   01/13/21 1130 01/13/21 1145  BP: 134/82 (!) 148/93  Pulse: 97 99  Resp: 20 19  Temp:    SpO2: 100% 100%    Last Pain:  Vitals:   01/13/21 1145  TempSrc:   PainSc: Asleep                 Ardath Lepak A.

## 2021-01-13 NOTE — Interval H&P Note (Signed)
History and Physical Interval Note:  01/13/2021 8:40 AM  Corey [name]  has presented today for surgery, with the diagnosis of ACUTE APPENDICITIS.  The various methods of treatment have been discussed with the patient and family. After consideration of risks, benefits and other options for treatment, the patient has consented to  Procedure(s): APPENDECTOMY LAPAROSCOPIC (N/A) as a surgical intervention.  The patient's history has been reviewed, patient examined, no change in status, stable for surgery.  I have reviewed the patient's chart and labs.  Questions were answered to the patient's satisfaction.    I have re-reviewed the the patient's records, history, medications, and allergies.  I have re-examined the patient.  I again discussed intraoperative plans and goals of post-operative recovery.  The patient agrees to proceed.  The anatomy & physiology of the digestive tract was discussed.  The pathophysiology of appendicitis and other appendiceal disorders were discussed.  Natural history risks without surgery was discussed.   I feel the risks of no intervention will lead to serious problems that outweigh the operative risks; therefore, I recommended diagnostic laparoscopy with removal of appendix to remove the pathology.  Laparoscopic & open techniques were discussed.   I noted a good likelihood this will help address the problem.   Risks such as bleeding, infection, abscess, leak, reoperation, injury to other organs, need for repair of tissues / organs, possible ostomy, hernia, heart attack, stroke, death, and other risks were discussed.  Goals of post-operative recovery were discussed as well.  We will work to minimize complications.  Questions were answered.  The patient expresses understanding & wishes to proceed with surgery.   Corey [name]  1965/09/19 [id number]  Patient Care Team: Denita Lung, MD as PCP - General (Family Medicine)  Patient Active Problem List   Diagnosis Date  Noted   Acute appendicitis 01/13/2021   H/O total knee replacement, left 11/11/2020   Leiomyoma 11/08/2020   Unilateral primary osteoarthritis, left knee 10/17/2020   Arthritis 09/26/2018   Onychomycosis 09/26/2018   Class 1 obesity due to excess calories without serious comorbidity with body mass index (BMI) of 32.0 to 32.9 in adult 09/06/2017   Hyperlipidemia LDL goal <100 08/31/2016   OSA on CPAP 08/31/2016   Premature ejaculation 08/19/2011    Past Medical History:  Diagnosis Date   Arthritis    knee, left   ED (erectile dysfunction)    Hyperlipidemia    Hypertension    Sleep apnea    CPAP - not using lately due to recall     Past Surgical History:  Procedure Laterality Date   ANKLE BONE SURGERY     BACK SURGERY     COLLAR BONE SURGEY     COLONOSCOPY     FRACTURE SURGERY     HERNIA REPAIR     KNEE ARTHROSCOPY Left 2006   LUMBAR MICRODISCECTOMY     POLYPECTOMY     TOTAL KNEE ARTHROPLASTY Left 11/11/2020   Procedure: LEFT TOTAL KNEE ARTHROPLASTY;  Surgeon: Marybelle Killings, MD;  Location: [place];  Service: Orthopedics;  Laterality: Left;   UMBILICAL HERNIA REPAIR      Social History   Socioeconomic History   Marital status: Single    Spouse name: Not on file   Number of children: 1   Years of education: Not on file   Highest education level: Not on file  Occupational History   Occupation: Engineer, agricultural  Tobacco Use   Smoking status: Never   Smokeless tobacco: Never  Vaping Use   Vaping Use: Never used  Substance and Sexual Activity   Alcohol use: Yes    Alcohol/week: 3.0 standard drinks    Types: 3 Glasses of wine per week    Comment: occasional   Drug use: Not Currently    Types: Marijuana   Sexual activity: Yes  Other Topics Concern   Not on file  Social History Narrative   Not on file   Social Determinants of Health   Financial Resource Strain: Not on file  Food Insecurity: Not on file  Transportation Needs: Not on file  Physical Activity: Not on  file  Stress: Not on file  Social Connections: Not on file  Intimate Partner Violence: Not on file    Family History  Problem Relation Age of Onset   Alzheimer's disease Mother    Stevens-Johnson syndrome Father    Autism Brother    Colon cancer Other    Colon polyps Neg Hx    Esophageal cancer Neg Hx    Rectal cancer Neg Hx    Stomach cancer Neg Hx     Medications Prior to Admission  Medication Sig Dispense Refill Last Dose   aspirin EC 325 MG tablet Take 1 tablet (325 mg total) by mouth daily. MUST TAKE AT LEAST 4 WEEKS POSTOP FOR DVT PROPHYLAXIS 30 tablet 0 01/11/2021   Multiple Vitamins-Minerals (MULTIVITAMIN WITH MINERALS) tablet Take 2 tablets by mouth daily.   01/11/2021   PARoxetine (PAXIL) 20 MG tablet Take 1 tablet (20 mg total) by mouth daily. TAKE 1 TABLET BY MOUTH EVERY DAY IN THE MORNING   01/11/2021   traMADol (ULTRAM) 50 MG tablet Take 1 tablet (50 mg total) by mouth every 6 (six) hours as needed. (Patient taking differently: Take 50 mg by mouth every 6 (six) hours as needed for moderate pain.) 40 tablet 0 01/11/2021   methocarbamol (ROBAXIN) 500 MG tablet Take 1 tablet (500 mg total) by mouth every 6 (six) hours as needed for muscle spasms. (Patient not taking: No sig reported) 60 tablet 0 Completed Course   oxyCODONE-acetaminophen (PERCOCET/ROXICET) 5-325 MG tablet Take 1 tablet by mouth every 4 (four) hours as needed for severe pain. (Patient not taking: No sig reported) 50 tablet 0 Completed Course    Current Facility-Administered Medications  Medication Dose Route Frequency Provider Last Rate Last Admin   0.9 % NaCl with KCl 20 mEq/ L  infusion   Intravenous Continuous Donnie Mesa, MD 125 mL/hr at 01/13/21 0158 New Bag at 01/13/21 0158   cefTRIAXone (ROCEPHIN) 2 g in sodium chloride 0.9 % 100 mL IVPB  2 g Intravenous Q24H Donnie Mesa, MD   Stopped at 01/13/21 0409   And   metroNIDAZOLE (FLAGYL) IVPB 500 mg  500 mg Intravenous Q12H Donnie Mesa, MD   Stopped  at 01/13/21 [phone]   diphenhydrAMINE (BENADRYL) capsule 25 mg  25 mg Oral Q6H PRN Donnie Mesa, MD       Or   diphenhydrAMINE (BENADRYL) injection 25 mg  25 mg Intravenous Q6H PRN Donnie Mesa, MD       enoxaparin (LOVENOX) injection 40 mg  40 mg Subcutaneous Q24H Donnie Mesa, MD       lactated ringers infusion   Intravenous Continuous Josephine Igo, MD 50 mL/hr at 01/13/21 0824 New Bag at 01/13/21 0824   morphine 2 MG/ML injection 2-4 mg  2-4 mg Intravenous Q2H PRN Donnie Mesa, MD       ondansetron (ZOFRAN-ODT) disintegrating tablet 4 mg  4 mg  Oral Q6H PRN Donnie Mesa, MD       Or   ondansetron [facility]) injection 4 mg  4 mg Intravenous Q6H PRN Donnie Mesa, MD       PARoxetine (PAXIL) tablet 20 mg  20 mg Oral Daily Donnie Mesa, MD         Allergies  Allergen Reactions   Penicillins Other (See Comments)    Makes pt black out    BP 127/84   Pulse (!) 103   Temp 99.4 F (37.4 C) (Oral)   Resp 17   Ht '5\' 9"'$  (1.753 m)   Wt 102.1 kg   SpO2 97%   BMI 33.23 kg/m   Labs: Results for orders placed or performed during the hospital encounter of 01/12/21 (from the past 48 hour(s))  CBC with Differential     Status: Abnormal   Collection Time: 01/12/21  1:07 PM  Result Value Ref Range   WBC 16.8 (H) 4.0 - 10.5 K/uL   RBC 4.55 4.22 - 5.81 MIL/uL   Hemoglobin 12.6 (L) 13.0 - 17.0 g/dL   HCT 39.7 39.0 - 52.0 %   MCV 87.3 80.0 - 100.0 fL   MCH 27.7 26.0 - 34.0 pg   MCHC 31.7 30.0 - 36.0 g/dL   RDW 14.6 11.5 - 15.5 %   Platelets 279 150 - 400 K/uL   nRBC 0.0 0.0 - 0.2 %   Neutrophils Relative % 87 %   Neutro Abs 14.7 (H) 1.7 - 7.7 K/uL   Lymphocytes Relative 6 %   Lymphs Abs 1.1 0.7 - 4.0 K/uL   Monocytes Relative 6 %   Monocytes Absolute 0.9 0.1 - 1.0 K/uL   Eosinophils Relative 0 %   Eosinophils Absolute 0.0 0.0 - 0.5 K/uL   Basophils Relative 0 %   Basophils Absolute 0.0 0.0 - 0.1 K/uL   Immature Granulocytes 1 %   Abs Immature Granulocytes 0.13 (H) 0.00 -  0.07 K/uL    Comment: Performed at [facility], [place] [address], [place], [place] [address]  Comprehensive metabolic panel     Status: Abnormal   Collection Time: 01/12/21  1:07 PM  Result Value Ref Range   Sodium 137 135 - 145 mmol/L   Potassium 4.0 3.5 - 5.1 mmol/L   Chloride 98 98 - 111 mmol/L   CO2 26 22 - 32 mmol/L   Glucose, Bld 142 (H) 70 - 99 mg/dL    Comment: Glucose reference range applies only to samples taken after fasting for at least 8 hours.   BUN 12 6 - 20 mg/dL   Creatinine, Ser 1.17 0.61 - 1.24 mg/dL   Calcium 9.2 8.9 - 10.3 mg/dL   Total Protein 7.8 6.5 - 8.1 g/dL   Albumin 3.9 3.5 - 5.0 g/dL   AST 22 15 - 41 U/L   ALT 25 0 - 44 U/L   Alkaline Phosphatase 66 38 - 126 U/L   Total Bilirubin 0.7 0.3 - 1.2 mg/dL   GFR, Estimated >60 >60 mL/min    Comment: (NOTE) Calculated using the CKD-EPI Creatinine Equation (2021)    Anion gap 13 5 - 15    Comment: Performed at [facility], [place] [address] [place], [place] [address]  Lipase, blood     Status: None   Collection Time: 01/12/21  1:07 PM  Result Value Ref Range   Lipase 31 11 - 51 U/L    Comment: Performed at [facility], [place]  [address], [place], [place] [address]  Urinalysis, Routine w reflex microscopic     Status: Abnormal   Collection Time: 01/12/21  1:29 PM  Result Value Ref Range   Color, Urine ORANGE (A) YELLOW    Comment: BIOCHEMICALS MAY BE AFFECTED BY COLOR   APPearance CLEAR CLEAR   Specific Gravity, Urine 1.020 1.005 - 1.030   pH 7.0 5.0 - 8.0   Glucose, UA NEGATIVE NEGATIVE mg/dL   Hgb urine dipstick TRACE (A) NEGATIVE   Bilirubin Urine NEGATIVE NEGATIVE   Ketones, ur NEGATIVE NEGATIVE mg/dL   Protein, ur TRACE (A) NEGATIVE mg/dL   Nitrite NEGATIVE NEGATIVE   Leukocytes,Ua NEGATIVE NEGATIVE    Comment: Performed at [place] [address], [place], [place] [address]  Urinalysis, Microscopic (reflex)     Status:  Abnormal   Collection Time: 01/12/21  1:29 PM  Result Value Ref Range   RBC / HPF 6-10 0 - 5 RBC/hpf   WBC, UA 0-5 0 - 5 WBC/hpf   Bacteria, UA RARE (A) NONE SEEN   Squamous Epithelial / LPF 0-5 0 - 5    Comment: Performed at [facility], [place] [address] [place],  [address]  Resp Panel by RT-PCR (Flu A&B, Covid) Nasopharyngeal Swab     Status: None   Collection Time: 01/12/21  5:16 PM   Specimen: Nasopharyngeal Swab; Nasopharyngeal(NP) swabs in vial transport medium  Result Value Ref Range   SARS Coronavirus 2 by RT PCR NEGATIVE NEGATIVE    Comment: (NOTE) SARS-CoV-2 target nucleic acids are NOT DETECTED.  The SARS-CoV-2 RNA is generally detectable in upper respiratory specimens during the acute phase of infection. The lowest concentration of SARS-CoV-2 viral copies this assay can detect is 138 copies/mL. A negative result does not preclude SARS-Cov-2 infection and should not be used as the sole basis for treatment or other patient management decisions. A negative result may occur with  improper specimen collection/handling, submission of specimen other than nasopharyngeal swab, presence of viral mutation(s) within the areas targeted by this assay, and inadequate number of viral copies(<138 copies/mL). A negative result must be combined with clinical observations, patient history, and epidemiological information. The expected result is Negative.  Fact Sheet for Patients:  EntrepreneurPulse.com.au  Fact Sheet for Healthcare Providers:  IncredibleEmployment.be  This test is no t yet approved or cleared by the [place] FDA and  has been authorized for detection and/or diagnosis of SARS-CoV-2 by FDA under an Emergency Use Authorization (EUA). This EUA will remain  in effect (meaning this test can be used) for the duration of the COVID-19 declaration under Section 564(b)(1) of the Act, 21 U.S.C.section  360bbb-3(b)(1), unless the authorization is terminated  or revoked sooner.       Influenza A by PCR NEGATIVE NEGATIVE   Influenza B by PCR NEGATIVE NEGATIVE    Comment: (NOTE) The Xpert Xpress SARS-CoV-2/FLU/RSV plus assay is intended as an aid in the diagnosis of influenza from Nasopharyngeal swab specimens and should not be used as a sole basis for treatment. Nasal washings and aspirates are unacceptable for Xpert Xpress SARS-CoV-2/FLU/RSV testing.  Fact Sheet for Patients: EntrepreneurPulse.com.au  Fact Sheet for Healthcare Providers: IncredibleEmployment.be  This test is not yet approved or cleared by the [place] FDA and has been authorized for detection and/or diagnosis of SARS-CoV-2 by FDA under an Emergency Use Authorization (EUA). This EUA will remain in effect (meaning this test can be used) for the duration of the COVID-19 declaration under  Section 564(b)(1) of the Act, 21 U.S.C. section 360bbb-3(b)(1), unless the authorization is terminated or revoked.  Performed at [facility], [place] [address] [place], [place] [address]     Imaging / Studies: CT ABDOMEN PELVIS W CONTRAST  Result Date: 01/12/2021 CLINICAL DATA:  Right lower quadrant abdominal pain EXAM: CT ABDOMEN AND PELVIS WITH CONTRAST TECHNIQUE: Multidetector CT imaging of the abdomen and pelvis was performed using the standard protocol following bolus administration of intravenous contrast. CONTRAST:  64m OMNIPAQUE IOHEXOL 350 MG/ML SOLN COMPARISON:  None. FINDINGS: Lower chest: Mild bibasilar atelectasis. Visualized heart and pericardium are unremarkable peer Hepatobiliary: Mild hepatic steatosis. No intra or extrahepatic biliary ductal dilation. Gallbladder unremarkable. Pancreas: Unremarkable Spleen: Unremarkable Adrenals/Urinary Tract: Adrenal glands are unremarkable. Kidneys are normal, without renal calculi, focal lesion, or hydronephrosis. Bladder  is unremarkable. Stomach/Bowel: The appendix is hyperemic and demonstrates extensive periappendiceal inflammatory stranding in keeping with changes of acute appendicitis. The appendix measures up to 15 mm in diameter. The extensive inflammatory change within the right lower quadrant medial to the cecum surrounding the appendix. There is no appendicoliths identified. No loculated periappendiceal fluid collection or extraluminal gas identified. Small free fluid noted layering along the right pericolic gutter and within the right retroperitoneum. The stomach, small bowel, and large bowel are otherwise unremarkable. No evidence of obstruction. No free intraperitoneal gas. Vascular/Lymphatic: Mild aortoiliac atherosclerotic calcification. No aortic aneurysm. No pathologic adenopathy within the abdomen and pelvis. Reproductive: Prostate is unremarkable. Other: No abdominal wall hernia.  Rectum unremarkable. Musculoskeletal: L4-5 lumbar fusion with instrumentation has been performed as well as resection of the spinous process of L4. No acute bone abnormality. No lytic or blastic bone lesion identified. IMPRESSION: Acute unruptured appendicitis. Appendix: Location: Medial to cecum Diameter: 15 mm Appendicolith: None Mucosal hyper-enhancement: Present Extraluminal gas: None Periappendiceal collection: None Electronically Signed   By: AFidela SalisburyM.D.   On: 01/12/2021 16:53   XR Knee 1-2 Views Left  Result Date: 12/25/2020 AP lateral left knee x-rays are obtained and reviewed.  This shows well-positioned total knee arthroplasty without loosening or subsidence.  Nondisplaced medial femoral condyle nondisplaced fracture noted with some slight early callus formation supracondylar region medially.  Review of 11/11/2020 images showed a nondisplaced fracture. Impression: Post on the arthroplasty with nondisplaced medial supracondylar fracture with interval healing.    .Adin Hector M.D., F.A.C.S. [facility] [address] [place] [phone] / Paging  01/13/2021 8:41 AM    SAdin Hector

## 2021-01-13 NOTE — Op Note (Signed)
PATIENT:  Corey [name]  55 y.o. male  Patient Care Team: Denita Lung, MD as PCP - General (Family Medicine)  PRE-OPERATIVE DIAGNOSIS:  ACUTE PERFORATED APPENDICITIS  POST-OPERATIVE DIAGNOSIS:  ACUTE PERFORATED PHLEGMONOUS APPENDICITIS   PROCEDURE:   LAPAROSCOPIC APPENDECTOMY WITH PARTIAL CECECTOMY LAPAROSCOPIC LYSIS OF ADHESIONS  SURGEON:  Adin Hector, MD  ANESTHESIA:   local and general  EBL:  Total I/O In: 1000 [I.V.:1000] Out: 195 [Urine:175; Blood:20]  Delay start of Pharmacological VTE agent (>24hrs) due to surgical blood loss or risk of bleeding:  no  DRAINS: none   SPECIMEN:  APPENDIX  DISPOSITION OF SPECIMEN:  PATHOLOGY  COUNTS:  YES  PLAN OF CARE: Admit to inpatient   PATIENT DISPOSITION:  PACU - hemodynamically stable.   INDICATIONS: Patient with concerning symptoms & work up suspicious for appendicitis.  Surgery was recommended:  The anatomy & physiology of the digestive tract was discussed.  The pathophysiology of appendicitis was discussed.  Natural history risks without surgery was discussed.   I feel the risks of no intervention will lead to serious problems that outweigh the operative risks; therefore, I recommended diagnostic laparoscopy with removal of appendix to remove the pathology.  Laparoscopic & open techniques were discussed.   I noted a good likelihood this will help address the problem.    Risks such as bleeding, infection, abscess, leak, reoperation, possible ostomy, hernia, heart attack, death, and other risks were discussed.  Goals of post-operative recovery were discussed as well.  We will work to minimize complications.  Questions were answered.  The patient expresses understanding & wishes to proceed with surgery.  OR FINDINGS: Retrocolic appendix with phlegmon and purulence consistent with probable chronic perforation.  Ischemic/necrotic tip.  CASE DATA:  Type of patient?: LDOW CASE (Surgical Hospitalist [facility]  Inpatient)  Status of Case? URGENT Add On  Infection Present At Time Of Surgery (PATOS)?  PURULENCE  DESCRIPTION:   Informed consent was confirmed.  The patient underwent general anaesthesia without difficulty.  The patient was positioned appropriately.  VTE prevention in place.  The patient's abdomen was clipped, prepped, & draped in a sterile fashion.  Surgical timeout confirmed our plan.  Peritoneal entry with a laparoscopic port was obtained using optical entry technique in the left upper abdomen as the patient was positioned in reverse Trendelenburg.  Entry was clean.  I induced carbon dioxide insufflation.  Camera inspection revealed no injury.  Extra ports were carefully placed under direct laparoscopic visualization. Upon entering the abdomen (organ space), I encountered purulence in the right lower quadrant. .  I mobilized the terminal ileum to proximal ascending colon in a lateral to medial fashion.  Encountered a phlegmon with some purulence.  Not to the point of a definite discrete abscess but quite inflamed.  I took care to avoid injuring any retroperitoneal structures.  Took some time to identify the appendix and free it off.  It was quite curled upon itself.  In doing this the appendix came off close to the base.  Unable to identify what I felt was a good candidate for the appendiceal stump.  I freed this off its attachments to the ascending colon and cecal mesentery.  I elevated the appendix. I skeletonized the mesoappendix. I was able to free off the base of the appendix which was still viable.  I stapled the appendix off the cecum using a laparoscopic stapler. I took a healthy cuff of viable cecum.  There was some oozing on the mesentery and staple  line but with some pressure irrigation this calm down well.  I placed the appendix inside an EcoSac bag and removed out the 71m stapler port.  I inspected the fractured appendix as well as the cecal wall and confirmed that I had stapled  across the appendiceal orifice with decent viable margins of cecum..  I did copious irrigation. Hemostasis was good in the mesoappendix, colon mesentery, and retroperitoneum. Staple line was intact on the cecum with no bleeding. I washed out the pelvis, retrohepatic space and right paracolic gutter. I washed out the left side as well.  Hemostasis is good. There was no perforation or injury. Because the area cleaned up well after irrigation, I did not place a drain.  I closed the 12 mm stapler port site with a #1 PDS stitch  using a suture passer under direct laparoscopic visualization.   I aspirated the carbon dioxide. I removed the ports.  I closed skin using 4-0 monocryl stitch.  Sterile dressings applied.  Patient was extubated and sent to the recovery room.  I suspect the patient is going used in the hospital at least overnight and will need antibiotics for 5 days. I discussed operative findings, updated the patient's status, discussed probable steps to recovery, and gave postoperative recommendations to the patient's spouse, DLowry Ram  Recommendations were made.  Questions were answered.  She expressed understanding & appreciation.   SAdin Hector M.D., F.A.C.S. [facility]astrointestinal and [facility]ally [facility] [address] [place] [phone] / Paging  01/13/2021 11:16 AM

## 2021-01-14 ENCOUNTER — Encounter (HOSPITAL_COMMUNITY): Payer: Self-pay | Admitting: Surgery

## 2021-01-14 ENCOUNTER — Encounter: Payer: Federal, State, Local not specified - PPO | Admitting: Physical Therapy

## 2021-01-14 DIAGNOSIS — K353 Acute appendicitis with localized peritonitis, without perforation or gangrene: Secondary | ICD-10-CM | POA: Diagnosis present

## 2021-01-14 DIAGNOSIS — Z9049 Acquired absence of other specified parts of digestive tract: Secondary | ICD-10-CM

## 2021-01-14 LAB — CBC
HCT: 32.7 % — ABNORMAL LOW (ref 39.0–52.0)
Hemoglobin: 10 g/dL — ABNORMAL LOW (ref 13.0–17.0)
MCH: 27 pg (ref 26.0–34.0)
MCHC: 30.6 g/dL (ref 30.0–36.0)
MCV: 88.4 fL (ref 80.0–100.0)
Platelets: 232 10*3/uL (ref 150–400)
RBC: 3.7 MIL/uL — ABNORMAL LOW (ref 4.22–5.81)
RDW: 14.5 % (ref 11.5–15.5)
WBC: 13.9 10*3/uL — ABNORMAL HIGH (ref 4.0–10.5)
nRBC: 0 % (ref 0.0–0.2)

## 2021-01-14 MED ORDER — METRONIDAZOLE 500 MG PO TABS: 500.0000 mg | ORAL_TABLET | Freq: Three times a day (TID) | ORAL | 0 refills | Status: AC

## 2021-01-14 MED ORDER — ACETAMINOPHEN 500 MG PO TABS: 1000.0000 mg | ORAL_TABLET | Freq: Four times a day (QID) | ORAL | 0 refills | Status: DC

## 2021-01-14 MED ORDER — DOCUSATE SODIUM 100 MG PO CAPS: 100.0000 mg | ORAL_CAPSULE | Freq: Two times a day (BID) | ORAL | 2 refills | Status: DC | PRN

## 2021-01-14 MED ORDER — CALCIUM POLYCARBOPHIL 625 MG PO TABS: 625.0000 mg | ORAL_TABLET | Freq: Two times a day (BID) | ORAL | 0 refills | Status: AC

## 2021-01-14 MED ORDER — TRAMADOL HCL 50 MG PO TABS: 50.0000 mg | ORAL_TABLET | Freq: Four times a day (QID) | ORAL | 0 refills | Status: AC | PRN

## 2021-01-14 MED ORDER — CIPROFLOXACIN HCL 500 MG PO TABS: 500.0000 mg | ORAL_TABLET | Freq: Two times a day (BID) | ORAL | 0 refills | Status: AC

## 2021-01-14 NOTE — Discharge Summary (Signed)
[facility] Discharge Summary   Patient ID: DEMORRIS JANIS MRN: [id number] DOB/AGE: July 29, 1965 55 y.o.  Admit date: 01/12/2021 Discharge date: 01/14/2021  Admitting Diagnosis: Acute appendicitis [K35.80] Acute appendicitis with localized peritonitis, without perforation, abscess, or gangrene [K35.30]   Discharge Diagnosis Acute appendicitis with localized peritonitis, without perforation, abscess, or gangrene [K35.30] s/p laparoscopic appendectomy with partial cecectomy  Consultants None  Imaging: CT ABDOMEN PELVIS W CONTRAST  Result Date: 01/12/2021 CLINICAL DATA:  Right lower quadrant abdominal pain EXAM: CT ABDOMEN AND PELVIS WITH CONTRAST TECHNIQUE: Multidetector CT imaging of the abdomen and pelvis was performed using the standard protocol following bolus administration of intravenous contrast. CONTRAST:  61m OMNIPAQUE IOHEXOL 350 MG/ML SOLN COMPARISON:  None. FINDINGS: Lower chest: Mild bibasilar atelectasis. Visualized heart and pericardium are unremarkable peer Hepatobiliary: Mild hepatic steatosis. No intra or extrahepatic biliary ductal dilation. Gallbladder unremarkable. Pancreas: Unremarkable Spleen: Unremarkable Adrenals/Urinary Tract: Adrenal glands are unremarkable. Kidneys are normal, without renal calculi, focal lesion, or hydronephrosis. Bladder is unremarkable. Stomach/Bowel: The appendix is hyperemic and demonstrates extensive periappendiceal inflammatory stranding in keeping with changes of acute appendicitis. The appendix measures up to 15 mm in diameter. The extensive inflammatory change within the right lower quadrant medial to the cecum surrounding the appendix. There is no appendicoliths identified. No loculated periappendiceal fluid collection or extraluminal gas identified. Small free fluid noted layering along the right pericolic gutter and within the right retroperitoneum. The stomach, small bowel, and large bowel are otherwise unremarkable. No  evidence of obstruction. No free intraperitoneal gas. Vascular/Lymphatic: Mild aortoiliac atherosclerotic calcification. No aortic aneurysm. No pathologic adenopathy within the abdomen and pelvis. Reproductive: Prostate is unremarkable. Other: No abdominal wall hernia.  Rectum unremarkable. Musculoskeletal: L4-5 lumbar fusion with instrumentation has been performed as well as resection of the spinous process of L4. No acute bone abnormality. No lytic or blastic bone lesion identified. IMPRESSION: Acute unruptured appendicitis. Appendix: Location: Medial to cecum Diameter: 15 mm Appendicolith: None Mucosal hyper-enhancement: Present Extraluminal gas: None Periappendiceal collection: None Electronically Signed   By: AFidela SalisburyM.D.   On: 01/12/2021 16:53    Procedures Dr. SMichael Boston(01/13/21) - LAPAROSCOPIC APPENDECTOMY WITH PARTIAL CECECTOMY, LAPAROSCOPIC LYSIS OF ADHESIONS  Hospital Course:  55year old male who presents with one day of right-sided abdominal pain, nausea, anorexia.  Afebrile.  No shortness of breath.  He presented to the ED for evaluation.  He was found to have appendicitis with no sign of perforation or abscess.  Patient was admitted and underwent procedure listed above.  Tolerated procedure well and was transferred to the floor.  Diet was advanced as tolerated.  On POD1, the patient was voiding well, tolerating diet, ambulating well, pain well controlled, vital signs stable, incisions c/d/i and felt stable for discharge home.  Patient will follow up in our office in 3 weeks and knows to call with questions or concerns.  He will call to confirm appointment date/time.    He will be discharged on 5 days of antibiotics  Physical Exam: General:  Alert, NAD, pleasant, comfortable Abd:  Soft, ND, mild tenderness, incisions C/D/I  I or a member of my team have reviewed this patient in the Controlled Substance Database.  Allergies as of 01/14/2021       Reactions   Penicillins Other  (See Comments)   Makes pt black out        Medication List     STOP taking these medications    methocarbamol 500 MG tablet Commonly known  as: Robaxin   oxyCODONE-acetaminophen 5-325 MG tablet Commonly known as: PERCOCET/ROXICET       TAKE these medications    acetaminophen 500 MG tablet Commonly known as: TYLENOL Take 2 tablets (1,000 mg total) by mouth every 6 (six) hours.   aspirin EC 325 MG tablet Take 1 tablet (325 mg total) by mouth daily. MUST TAKE AT LEAST 4 WEEKS POSTOP FOR DVT PROPHYLAXIS   ciprofloxacin 500 MG tablet Commonly known as: Cipro Take 1 tablet (500 mg total) by mouth 2 (two) times daily for 5 days.   docusate sodium 100 MG capsule Commonly known as: Colace Take 1 capsule (100 mg total) by mouth 2 (two) times daily as needed for mild constipation or moderate constipation.   metroNIDAZOLE 500 MG tablet Commonly known as: Flagyl Take 1 tablet (500 mg total) by mouth 3 (three) times daily for 5 days.   multivitamin with minerals tablet Take 2 tablets by mouth daily.   PARoxetine 20 MG tablet Commonly known as: PAXIL Take 1 tablet (20 mg total) by mouth daily. TAKE 1 TABLET BY MOUTH EVERY DAY IN THE MORNING   polycarbophil 625 MG tablet Commonly known as: FIBERCON Take 1 tablet (625 mg total) by mouth 2 (two) times daily for 5 days.   traMADol 50 MG tablet Commonly known as: Ultram Take 1 tablet (50 mg total) by mouth every 6 (six) hours as needed for up to 3 days for moderate pain or severe pain.       ASK your doctor about these medications    traMADol 50 MG tablet Commonly known as: ULTRAM Take 1 tablet (50 mg total) by mouth every 6 (six) hours as needed.          Follow-up Information     [facility] [place] Follow up in 3 week(s).   Specialty: General Surgery Why: We are working to make this appointment for you. Please call to confirm your appointment date and time Contact information: [place] [address] [phone]                 Signed: Winferd Humphrey [facility] 01/14/2021, 10:28 AM Please see Amion for pager number during day hours 7:00am-4:30pm

## 2021-01-14 NOTE — Plan of Care (Addendum)
Instructions were reviewed with patient. All questions were answered. Patient was transported to main entrance by wheelchair. ° °

## 2021-01-15 LAB — SURGICAL PATHOLOGY

## 2021-01-16 ENCOUNTER — Encounter: Payer: Federal, State, Local not specified - PPO | Admitting: Rehabilitative and Restorative Service Providers"

## 2021-01-21 ENCOUNTER — Encounter: Payer: Self-pay | Admitting: Physical Therapy

## 2021-01-21 ENCOUNTER — Ambulatory Visit: Payer: Federal, State, Local not specified - PPO | Admitting: Physical Therapy

## 2021-01-21 ENCOUNTER — Other Ambulatory Visit: Payer: Self-pay

## 2021-01-21 DIAGNOSIS — R262 Difficulty in walking, not elsewhere classified: Secondary | ICD-10-CM

## 2021-01-21 DIAGNOSIS — M6281 Muscle weakness (generalized): Secondary | ICD-10-CM | POA: Diagnosis not present

## 2021-01-21 DIAGNOSIS — M25562 Pain in left knee: Secondary | ICD-10-CM

## 2021-01-21 DIAGNOSIS — M25662 Stiffness of left knee, not elsewhere classified: Secondary | ICD-10-CM | POA: Diagnosis not present

## 2021-01-21 DIAGNOSIS — R600 Localized edema: Secondary | ICD-10-CM

## 2021-01-21 NOTE — Therapy (Addendum)
[facility] [address] [place], [place], [address] Phone: [phone]   Fax:  [phone]  Physical Therapy Treatment Progress Note  Patient Details  Name: Corey [name] MRN: [id number] Date of Birth: [birth year] Referring Provider (PT): Benjiman Core PA-C  Progress Note Reporting Period  7//09/2020 to 01/22/2021   See note below for Objective Data and Assessment of Progress/Goals.     Encounter Date: 01/21/2021   PT End of Session - 01/21/21 0901     Visit Number 9    Number of Visits 24    Date for PT Re-Evaluation 02/28/21    Authorization Type BCBS    Progress Note Due on Visit 10    PT Start Time 0840    PT Stop Time 0920    PT Time Calculation (min) 40 min    Activity Tolerance Patient tolerated treatment well;No increased pain    Behavior During Therapy WFL for tasks assessed/performed             Past Medical History:  Diagnosis Date   Arthritis    knee, left   ED (erectile dysfunction)    Hyperlipidemia    Hypertension    Sleep apnea    CPAP - not using lately due to recall     Past Surgical History:  Procedure Laterality Date   ANKLE BONE SURGERY     BACK SURGERY     COLLAR BONE SURGEY     COLONOSCOPY     FRACTURE SURGERY     HERNIA REPAIR     KNEE ARTHROSCOPY Left 2006   LAPAROSCOPIC APPENDECTOMY N/A 01/13/2021   Procedure: APPENDECTOMY LAPAROSCOPIC WITH LYSIS OF ADHESIONS;  Surgeon: Michael Boston, MD;  Location: [facility];  Service: General;  Laterality: N/A;   LUMBAR MICRODISCECTOMY     POLYPECTOMY     TOTAL KNEE ARTHROPLASTY Left 11/11/2020   Procedure: LEFT TOTAL KNEE ARTHROPLASTY;  Surgeon: Marybelle Killings, MD;  Location: [facility];  Service: Orthopedics;  Laterality: Left;   UMBILICAL HERNIA REPAIR      There were no vitals filed for this visit.   Subjective Assessment - 01/21/21 0849     Subjective Pt arriving today with no pain reproted in his left knee. Pt reporting sleeping is "going good".     Pertinent History Back surgery, HTN, OA,    Limitations Walking;House hold activities;Lifting    How long can you sit comfortably? Stiffness when he changes position    Patient Stated Goals I want to get back to 100%, work out again    Currently in Pain? No/denies                [facility] PT Assessment - 01/21/21 0001       AROM   Overall AROM Comments supine    Right/Left Knee Left    Left Knee Extension -5    Left Knee Flexion 98      PROM   Overall PROM Comments supine    Right/Left Knee Left    Left Knee Extension -4    Left Knee Flexion 100                           [facility] Adult PT Treatment/Exercise - 01/21/21 0001       Neuro Re-ed    Neuro Re-ed Details  lifting box from floor to standing position x 10 using correct body mechanics, SLS 30 seconds bilateral LE's on level surfaces  Exercises   Exercises Knee/Hip      Knee/Hip Exercises: Stretches   Other Knee/Hip Stretches prone knee flexion using strap holding 1 minute x 3, knee extension in supine with over pressure by therapist      Knee/Hip Exercises: Aerobic   Recumbent Bike seat 10 x 9 minutes      Knee/Hip Exercises: Machines for Strengthening   Total Gym Leg Press 125# L LE only 3x10, calf raises 73# 2x10 bilateral LE's      Knee/Hip Exercises: Standing   Stairs 8 inch step up/down with Left LE x 20, forward and lateral      Modalities   Modalities --   pt reported he will ice at home     Manual Therapy   Manual therapy comments extension with overpressure in supine                      PT Short Term Goals - 01/21/21 0912       PT SHORT TERM GOAL #1   Title Pt will be independent in his initial HEP.    Status Achieved      PT SHORT TERM GOAL #2   Title pt will improve his 5 time sit to stand to </= 14 seconds with no UE support    Status Achieved               PT Long Term Goals - 01/21/21 0912       PT LONG TERM GOAL #1   Title Pt will be  independent in his advanced HEP.    Status On-going      PT LONG TERM GOAL #2   Title Pt will be able to amb community surfaces with no device safely for >/= 1000 feet.    Status Achieved      PT LONG TERM GOAL #3   Title Pt will improve his left LE strength to >/= 4+/5 to improve function    Status On-going      PT LONG TERM GOAL #4   Title Pt will improve his left knee ROM 0-120 degrees actively.    Baseline 98 degrees flexion in left knee on 01/21/2021    Status On-going      PT LONG TERM GOAL #5   Title Pt will be able to navigate 1 flight of steps with single hand rail with pain </= 2/10.    Status Achieved      PT LONG TERM GOAL #6   Title Pt will improve his FOTO to >/= 55.    Status On-going                   Plan - 01/21/21 0902     Clinical Impression Statement Pt s/p appendectomy on 01/13/2021. Pt is doing well with functional mobility progressingfocusing on  stair navigation, lifting and functional squats. Continue to progress as pt tolerates toward LTG"s set to maximize function.    Personal Factors and Comorbidities Comorbidity 3+    Comorbidities back surgery, HTN, OA    Examination-Activity Limitations Lift;Transfers;Stairs;Stand;Squat    Examination-Participation Restrictions Other;Community Activity    Stability/Clinical Decision Making Stable/Uncomplicated    Rehab Potential Excellent    PT Frequency 2x / week    PT Treatment/Interventions ADLs/Self Care Home Management;Cryotherapy;Electrical Stimulation;Gait training;Stair training;Functional mobility training;Therapeutic activities;Therapeutic exercise;Balance training;Neuromuscular re-education;Patient/family education;Manual techniques;Passive range of motion;Taping;Vasopneumatic Device    PT Next Visit Plan Functional mobility, balance, full AROM    PT Home Exercise  Plan Access Code: [email or web address] and Agree with Plan of Care Patient             Patient will benefit from skilled  therapeutic intervention in order to improve the following deficits and impairments:  Pain, Difficulty walking, Decreased range of motion, Decreased mobility, Decreased strength, Increased edema, Decreased balance, Impaired flexibility, Decreased activity tolerance  Visit Diagnosis: Difficulty in walking, not elsewhere classified  Muscle weakness (generalized)  Stiffness of left knee, not elsewhere classified  Localized edema  Difficulty walking  Acute pain of left knee     Problem List Patient Active Problem List   Diagnosis Date Noted   Status post laparoscopic appendectomy 01/14/2021   H/O total knee replacement, left 11/11/2020   Leiomyoma 11/08/2020   Unilateral primary osteoarthritis, left knee 10/17/2020   Arthritis 09/26/2018   Onychomycosis 09/26/2018   Class 1 obesity due to excess calories without serious comorbidity with body mass index (BMI) of 32.0 to 32.9 in adult 09/06/2017   Hyperlipidemia LDL goal <100 08/31/2016   OSA on CPAP 08/31/2016   Premature ejaculation 08/19/2011    Oretha Caprice, PT, MPT 01/21/2021, 9:30 AM  [facility] [address] [place], [place], [address] Phone: [phone]   Fax:  [phone]  Name: Corey [name] MRN: [id number] Date of Birth: 1965-12-07

## 2021-01-23 ENCOUNTER — Encounter: Payer: Federal, State, Local not specified - PPO | Admitting: Physical Therapy

## 2021-01-28 ENCOUNTER — Encounter: Payer: Self-pay | Admitting: Physical Therapy

## 2021-01-28 ENCOUNTER — Other Ambulatory Visit: Payer: Self-pay

## 2021-01-28 ENCOUNTER — Ambulatory Visit: Payer: Federal, State, Local not specified - PPO | Admitting: Physical Therapy

## 2021-01-28 DIAGNOSIS — R262 Difficulty in walking, not elsewhere classified: Secondary | ICD-10-CM | POA: Diagnosis not present

## 2021-01-28 DIAGNOSIS — M25662 Stiffness of left knee, not elsewhere classified: Secondary | ICD-10-CM | POA: Diagnosis not present

## 2021-01-28 DIAGNOSIS — M6281 Muscle weakness (generalized): Secondary | ICD-10-CM | POA: Diagnosis not present

## 2021-01-28 DIAGNOSIS — R600 Localized edema: Secondary | ICD-10-CM

## 2021-01-28 DIAGNOSIS — M25562 Pain in left knee: Secondary | ICD-10-CM

## 2021-01-28 NOTE — Therapy (Signed)
[facility] [address] [place], [place], [address] Phone: [phone]   Fax:  [phone]  Physical Therapy Treatment  Patient Details  Name: Corey [name] MRN: [id number] Date of Birth: 05/08/1966 Referring Provider (PT): Benjiman Core PA-C) Rodell Perna MD   Encounter Date: 01/28/2021   PT End of Session - 01/28/21 0848     Visit Number 10    Number of Visits 24    Date for PT Re-Evaluation 02/28/21    Authorization Type BCBS    Authorization Time Period Progress Note sent at 9th visit    Progress Note Due on Visit 19    PT Start Time 0842    PT Stop Time 0920    PT Time Calculation (min) 38 min    Activity Tolerance Patient tolerated treatment well;No increased pain    Behavior During Therapy WFL for tasks assessed/performed             Past Medical History:  Diagnosis Date   Arthritis    knee, left   ED (erectile dysfunction)    Hyperlipidemia    Hypertension    Sleep apnea    CPAP - not using lately due to recall     Past Surgical History:  Procedure Laterality Date   ANKLE BONE SURGERY     BACK SURGERY     COLLAR BONE SURGEY     COLONOSCOPY     FRACTURE SURGERY     HERNIA REPAIR     KNEE ARTHROSCOPY Left 2006   LAPAROSCOPIC APPENDECTOMY N/A 01/13/2021   Procedure: APPENDECTOMY LAPAROSCOPIC WITH LYSIS OF ADHESIONS;  Surgeon: Michael Boston, MD;  Location: [facility];  Service: General;  Laterality: N/A;   LUMBAR MICRODISCECTOMY     POLYPECTOMY     TOTAL KNEE ARTHROPLASTY Left 11/11/2020   Procedure: LEFT TOTAL KNEE ARTHROPLASTY;  Surgeon: Marybelle Killings, MD;  Location: [facility];  Service: Orthopedics;  Laterality: Left;   UMBILICAL HERNIA REPAIR      There were no vitals filed for this visit.   Subjective Assessment - 01/28/21 0848     Subjective Pt stating good response following last session and no pain upon arrival.    Pertinent History Back surgery, HTN, OA,    Limitations Walking;House hold activities;Lifting     How long can you sit comfortably? Stiffness when he changes position    Patient Stated Goals I want to get back to 100%, work out again    Currently in Pain? No/denies                [facility] PT Assessment - 01/28/21 0001       Assessment   Medical Diagnosis [id number]    Referring Provider (PT) Benjiman Core PA-C) Rodell Perna MD    Onset Date/Surgical Date 11/11/20      AROM   Overall AROM Comments supine    Right/Left Knee Left    Left Knee Extension -6    Left Knee Flexion 98      PROM   Overall PROM Comments supine    Right/Left Knee Left    Left Knee Extension -4    Left Knee Flexion 100                           [facility] Adult PT Treatment/Exercise - 01/28/21 0001       Neuro Re-ed    Neuro Re-ed Details  SLS on Lt LE while sliding dics in "  Y" pattern, using balance pods tapping 6 disc for 20 seconds x 9 while standing on flat surface and then on Airex (semicircle pattern average 19 hits), step downs on 6 and 8 inch step no UE support      Exercises   Exercises Knee/Hip      Knee/Hip Exercises: Aerobic   Recumbent Bike seat 10 x 8 minutes      Knee/Hip Exercises: Machines for Strengthening   Total Gym Leg Press 150# Lt LE only 3x10      Knee/Hip Exercises: Supine   Quad Sets Strengthening;5 reps    Quad Sets Limitations with over pressure during set holding 5 seconds                      PT Short Term Goals - 01/21/21 0912       PT SHORT TERM GOAL #1   Title Pt will be independent in his initial HEP.    Status Achieved      PT SHORT TERM GOAL #2   Title pt will improve his 5 time sit to stand to </= 14 seconds with no UE support    Status Achieved               PT Long Term Goals - 01/21/21 0912       PT LONG TERM GOAL #1   Title Pt will be independent in his advanced HEP.    Status On-going      PT LONG TERM GOAL #2   Title Pt will be able to amb community surfaces with no device safely for >/= 1000 feet.     Status Achieved      PT LONG TERM GOAL #3   Title Pt will improve his left LE strength to >/= 4+/5 to improve function    Status On-going      PT LONG TERM GOAL #4   Title Pt will improve his left knee ROM 0-120 degrees actively.    Baseline 98 degrees flexion in left knee on 01/21/2021    Status On-going      PT LONG TERM GOAL #5   Title Pt will be able to navigate 1 flight of steps with single hand rail with pain </= 2/10.    Status Achieved      PT LONG TERM GOAL #6   Title Pt will improve his FOTO to >/= 55.    Status On-going                   Plan - 01/28/21 0849     Clinical Impression Statement Pt arriving with no pain. Pt tolerating all exercises well.  Discussed conitnued ice with elevation to help with edema in left knee. Treatment focusing on ROM and strengthening. Continue skilled PT.    Personal Factors and Comorbidities Comorbidity 3+    Comorbidities back surgery, HTN, OA    Examination-Activity Limitations Lift;Transfers;Stairs;Stand;Squat    Examination-Participation Restrictions Other;Community Activity    Stability/Clinical Decision Making Stable/Uncomplicated    Rehab Potential Excellent    PT Frequency 2x / week    PT Duration 12 weeks    PT Treatment/Interventions ADLs/Self Care Home Management;Cryotherapy;Electrical Stimulation;Gait training;Stair training;Functional mobility training;Therapeutic activities;Therapeutic exercise;Balance training;Neuromuscular re-education;Patient/family education;Manual techniques;Passive range of motion;Taping;Vasopneumatic Device    PT Next Visit Plan Functional mobility, balance, full AROM, strength    PT Home Exercise Plan Access Code: [email or web address]    Consulted and Agree with Plan of Care Patient  Patient will benefit from skilled therapeutic intervention in order to improve the following deficits and impairments:  Pain, Difficulty walking, Decreased range of motion, Decreased mobility, Decreased  strength, Increased edema, Decreased balance, Impaired flexibility, Decreased activity tolerance  Visit Diagnosis: Difficulty in walking, not elsewhere classified  Muscle weakness (generalized)  Stiffness of left knee, not elsewhere classified  Localized edema  Difficulty walking  Acute pain of left knee     Problem List Patient Active Problem List   Diagnosis Date Noted   Status post laparoscopic appendectomy 01/14/2021   H/O total knee replacement, left 11/11/2020   Leiomyoma 11/08/2020   Unilateral primary osteoarthritis, left knee 10/17/2020   Arthritis 09/26/2018   Onychomycosis 09/26/2018   Class 1 obesity due to excess calories without serious comorbidity with body mass index (BMI) of 32.0 to 32.9 in adult 09/06/2017   Hyperlipidemia LDL goal <100 08/31/2016   OSA on CPAP 08/31/2016   Premature ejaculation 08/19/2011    Oretha Caprice, PT, MPT 01/28/2021, 9:28 AM  [facility] [address] [place], [place], [address] Phone: [phone]   Fax:  [phone]  Name: WINDELL BINGER MRN: [id number] Date of Birth: Oct 09, 1965

## 2021-01-30 ENCOUNTER — Other Ambulatory Visit: Payer: Self-pay

## 2021-01-30 ENCOUNTER — Ambulatory Visit: Payer: Federal, State, Local not specified - PPO | Admitting: Rehabilitative and Restorative Service Providers"

## 2021-01-30 ENCOUNTER — Encounter: Payer: Self-pay | Admitting: Rehabilitative and Restorative Service Providers"

## 2021-01-30 DIAGNOSIS — R262 Difficulty in walking, not elsewhere classified: Secondary | ICD-10-CM

## 2021-01-30 DIAGNOSIS — M6281 Muscle weakness (generalized): Secondary | ICD-10-CM | POA: Diagnosis not present

## 2021-01-30 DIAGNOSIS — R600 Localized edema: Secondary | ICD-10-CM | POA: Diagnosis not present

## 2021-01-30 DIAGNOSIS — M25662 Stiffness of left knee, not elsewhere classified: Secondary | ICD-10-CM

## 2021-01-30 NOTE — Patient Instructions (Signed)
Continue current excellent HEP compliance.

## 2021-01-30 NOTE — Therapy (Signed)
[facility] [address] [place], [place], [address] Phone: [phone]   Fax:  [phone]  Physical Therapy Treatment  Patient Details  Name: MALCOMB ROELFS MRN: [id number] Date of Birth: Nov 06, 1965 Referring Provider (PT): Benjiman Core PA-C) Rodell Perna MD   Encounter Date: 01/30/2021   PT End of Session - 01/30/21 0931     Visit Number 11    Number of Visits 24    Date for PT Re-Evaluation 02/28/21    Authorization Type BCBS    Authorization Time Period Progress Note sent at 9th visit    Progress Note Due on Visit 19    PT Start Time 0847    PT Stop Time 0940    PT Time Calculation (min) 53 min    Activity Tolerance Patient tolerated treatment well;No increased pain    Behavior During Therapy WFL for tasks assessed/performed             Past Medical History:  Diagnosis Date   Arthritis    knee, left   ED (erectile dysfunction)    Hyperlipidemia    Hypertension    Sleep apnea    CPAP - not using lately due to recall     Past Surgical History:  Procedure Laterality Date   ANKLE BONE SURGERY     BACK SURGERY     COLLAR BONE SURGEY     COLONOSCOPY     FRACTURE SURGERY     HERNIA REPAIR     KNEE ARTHROSCOPY Left 2006   LAPAROSCOPIC APPENDECTOMY N/A 01/13/2021   Procedure: APPENDECTOMY LAPAROSCOPIC WITH LYSIS OF ADHESIONS;  Surgeon: Michael Boston, MD;  Location: [facility];  Service: General;  Laterality: N/A;   LUMBAR MICRODISCECTOMY     POLYPECTOMY     TOTAL KNEE ARTHROPLASTY Left 11/11/2020   Procedure: LEFT TOTAL KNEE ARTHROPLASTY;  Surgeon: Marybelle Killings, MD;  Location: [facility];  Service: Orthopedics;  Laterality: Left;   UMBILICAL HERNIA REPAIR      There were no vitals filed for this visit.   Subjective Assessment - 01/30/21 0854     Subjective Lysle reports continued excellent HEP compliance.  He is icing 3X/Day.  No pain.    Pertinent History Back surgery, HTN, OA,    Limitations Walking;House hold  activities;Lifting    How long can you sit comfortably? Stiffness when he first stands-up    How long can you stand comfortably? Couple hours (was 30 minutes)    How long can you walk comfortably? 45 minutes (was short errands like the grocery store)    Diagnostic tests X-ray    Patient Stated Goals I want to get back to 100%, work out again    Currently in Pain? No/denies    Pain Score 0-No pain    Pain Location Knee    Pain Orientation Left    Pain Descriptors / Indicators Tightness    Pain Type Surgical pain    Pain Radiating Towards NA    Pain Onset More than a month ago    Pain Frequency Rarely    Aggravating Factors  Edema increases with too much WB.  End range AROM stiffness.    Pain Relieving Factors Exercises    Effect of Pain on Daily Activities Out of work and edema increases with increased WB    Multiple Pain Sites No                [facility] PT Assessment - 01/30/21 0001  AROM   Overall AROM Comments Supine    Right/Left Knee Left    Left Knee Extension -3    Left Knee Flexion 99                           [facility] Adult PT Treatment/Exercise - 01/30/21 0001       Exercises   Exercises Knee/Hip      Knee/Hip Exercises: Stretches   Other Knee/Hip Stretches Prone knee extension stretch with rolled up towels above knees and 8# weight on L heel 3 minutes      Knee/Hip Exercises: Aerobic   Recumbent Bike Seat 10 for 9 minutes      Knee/Hip Exercises: Machines for Strengthening   Total Gym Leg Press 150# L leg only push into full extension and stretch into flexion (5 seconds each)      Knee/Hip Exercises: Seated   Other Seated Knee/Hip Exercises Knee flexion AAROM (R pushes L into flexion) 10X 10 seconds and tailgate knee flexion 1 minute      Knee/Hip Exercises: Supine   Quad Sets Strengthening;Left;10 reps    Quad Sets Limitations 5 seconds      Modalities   Modalities Vasopneumatic      Vasopneumatic   Number Minutes Vasopneumatic   10 minutes    Vasopnuematic Location  Knee    Vasopneumatic Pressure Medium    Vasopneumatic Temperature  34      Manual Therapy   Manual therapy comments Extension with PT overpressure 5X 10 seconds                    PT Education - 01/30/21 0929     Education Details Reinforced LTG of 0-105 degrees by follow-up with Dr. Lorin Mercy in 2 weeks (-3 to 99 degrees today)    Person(s) Educated Patient    Methods Explanation;Demonstration;Tactile cues;Verbal cues    Comprehension Verbalized understanding;Tactile cues required;Need further instruction;Returned demonstration;Verbal cues required              PT Short Term Goals - 01/21/21 0912       PT SHORT TERM GOAL #1   Title Pt will be independent in his initial HEP.    Status Achieved      PT SHORT TERM GOAL #2   Title pt will improve his 5 time sit to stand to </= 14 seconds with no UE support    Status Achieved               PT Long Term Goals - 01/30/21 0930       PT LONG TERM GOAL #1   Title Pt will be independent in his advanced HEP.    Status On-going      PT LONG TERM GOAL #2   Title Pt will be able to amb community surfaces with no device safely for >/= 1000 feet.    Status Achieved      PT LONG TERM GOAL #3   Title Pt will improve his left LE strength to >/= 4+/5 to improve function    Status On-going      PT LONG TERM GOAL #4   Title Pt will improve his left knee ROM 0-120 degrees actively.    Baseline -3 to 99 degrees on 01/30/2021    Time 4    Period Weeks    Status On-going    Target Date 02/28/21      PT LONG TERM GOAL #5  Title Pt will be able to navigate 1 flight of steps with single hand rail with pain </= 2/10.    Status Achieved      PT LONG TERM GOAL #6   Title Pt will improve his FOTO to >/= 55.    Status On-going                   Plan - 01/30/21 1725     Clinical Impression Statement Yeison continues to do a great job with his HEP.  AROM is getting closer  to 0-105 degrees of AROM goal set for his follow-up with Dr. Lorin Mercy in 2 weeks.  Continue current program with possible DC at or near his MD follow-up date.    Personal Factors and Comorbidities Comorbidity 3+    Comorbidities back surgery, HTN, OA    Examination-Activity Limitations Lift;Transfers;Stairs;Stand;Squat    Examination-Participation Restrictions Other;Community Activity    Stability/Clinical Decision Making Stable/Uncomplicated    Rehab Potential Excellent    PT Frequency 2x / week    PT Duration 12 weeks    PT Treatment/Interventions ADLs/Self Care Home Management;Cryotherapy;Electrical Stimulation;Gait training;Stair training;Functional mobility training;Therapeutic activities;Therapeutic exercise;Balance training;Neuromuscular re-education;Patient/family education;Manual techniques;Passive range of motion;Taping;Vasopneumatic Device    PT Next Visit Plan AROM, quadriceps strength, stairs, balance    PT Home Exercise Plan Access Code: [email or web address]    Consulted and Agree with Plan of Care Patient             Patient will benefit from skilled therapeutic intervention in order to improve the following deficits and impairments:  Pain, Difficulty walking, Decreased range of motion, Decreased mobility, Decreased strength, Increased edema, Decreased balance, Impaired flexibility, Decreased activity tolerance  Visit Diagnosis: Muscle weakness (generalized)  Stiffness of left knee, not elsewhere classified  Localized edema  Difficulty walking     Problem List Patient Active Problem List   Diagnosis Date Noted   Status post laparoscopic appendectomy 01/14/2021   H/O total knee replacement, left 11/11/2020   Leiomyoma 11/08/2020   Unilateral primary osteoarthritis, left knee 10/17/2020   Arthritis 09/26/2018   Onychomycosis 09/26/2018   Class 1 obesity due to excess calories without serious comorbidity with body mass index (BMI) of 32.0 to 32.9 in adult 09/06/2017    Hyperlipidemia LDL goal <100 08/31/2016   OSA on CPAP 08/31/2016   Premature ejaculation 08/19/2011    Farley Ly PT, MPT 01/30/2021, 5:28 PM  [facility] [address] [place], [place], [address] Phone: [phone]   Fax:  [phone]  Name: OSCAR CARPENITO MRN: [id number] Date of Birth: [birth year]

## 2021-02-04 ENCOUNTER — Other Ambulatory Visit: Payer: Self-pay

## 2021-02-04 ENCOUNTER — Ambulatory Visit: Payer: Federal, State, Local not specified - PPO | Admitting: Physical Therapy

## 2021-02-04 ENCOUNTER — Encounter: Payer: Self-pay | Admitting: Physical Therapy

## 2021-02-04 DIAGNOSIS — M6281 Muscle weakness (generalized): Secondary | ICD-10-CM

## 2021-02-04 DIAGNOSIS — M25662 Stiffness of left knee, not elsewhere classified: Secondary | ICD-10-CM | POA: Diagnosis not present

## 2021-02-04 DIAGNOSIS — R262 Difficulty in walking, not elsewhere classified: Secondary | ICD-10-CM | POA: Diagnosis not present

## 2021-02-04 DIAGNOSIS — R600 Localized edema: Secondary | ICD-10-CM

## 2021-02-04 DIAGNOSIS — M25562 Pain in left knee: Secondary | ICD-10-CM

## 2021-02-04 NOTE — Therapy (Signed)
[facility] [address] [place], [place], [address] Phone: [phone]   Fax:  [phone]  Physical Therapy Treatment  Patient Details  Name: Corey [name] MRN: [id number] Date of Birth: Nov 28, 1965 Referring Provider (PT): Benjiman Core PA-C   Encounter Date: 02/04/2021   PT End of Session - 02/04/21 0906     Visit Number 12    Number of Visits 24    Date for PT Re-Evaluation 02/28/21    Authorization Type BCBS    Authorization Time Period Progress Note sent at 9th visit    Progress Note Due on Visit 19    PT Start Time 0845    PT Stop Time 0930    PT Time Calculation (min) 45 min    Activity Tolerance Patient tolerated treatment well;No increased pain    Behavior During Therapy WFL for tasks assessed/performed             Past Medical History:  Diagnosis Date   Arthritis    knee, left   ED (erectile dysfunction)    Hyperlipidemia    Hypertension    Sleep apnea    CPAP - not using lately due to recall     Past Surgical History:  Procedure Laterality Date   ANKLE BONE SURGERY     BACK SURGERY     COLLAR BONE SURGEY     COLONOSCOPY     FRACTURE SURGERY     HERNIA REPAIR     KNEE ARTHROSCOPY Left 2006   LAPAROSCOPIC APPENDECTOMY N/A 01/13/2021   Procedure: APPENDECTOMY LAPAROSCOPIC WITH LYSIS OF ADHESIONS;  Surgeon: Michael Boston, MD;  Location: [facility];  Service: General;  Laterality: N/A;   LUMBAR MICRODISCECTOMY     POLYPECTOMY     TOTAL KNEE ARTHROPLASTY Left 11/11/2020   Procedure: LEFT TOTAL KNEE ARTHROPLASTY;  Surgeon: Marybelle Killings, MD;  Location: [facility];  Service: Orthopedics;  Laterality: Left;   UMBILICAL HERNIA REPAIR      There were no vitals filed for this visit.   Subjective Assessment - 02/04/21 0905     Subjective Pt arriving today reporting no pain. Pt stating he has been going to the gym to focus on increased strength and range of motion.    Pertinent History Back surgery, HTN, OA,    Limitations  Walking;House hold activities;Lifting    How long can you sit comfortably? Stiffness when he first stands-up    How long can you stand comfortably? Couple hours (was 30 minutes)    How long can you walk comfortably? 45 minutes (was short errands like the grocery store)    Diagnostic tests X-ray    Currently in Pain? No/denies                [facility] PT Assessment - 02/04/21 0001       Assessment   Medical Diagnosis [id number]    Referring Provider (PT) Benjiman Core PA-C    Onset Date/Surgical Date 11/11/20      AROM   Overall AROM Comments supine    Right/Left Knee Left    Left Knee Extension -4    Left Knee Flexion 95                           [facility] Adult PT Treatment/Exercise - 02/04/21 0001       Neuro Re-ed    Neuro Re-ed Details  SLS: on Airex using 6 balance bods for 30 seconds each interval  with taps (25, 31, 29, 33, 28, 20) pt with definite fatigue in left knee at end of 6 reps      Exercises   Exercises Knee/Hip      Knee/Hip Exercises: Aerobic   Recumbent Bike bike/UBE combo LE only seat at 13 L5 x 8 minutes      Knee/Hip Exercises: Machines for Strengthening   Total Gym Leg Press 150# 3x10 slow eccentric/controlled movements      Knee/Hip Exercises: Standing   Stairs 8 inch step up and down x 15 no UE support    Other Standing Knee Exercises TRX squats x 15      Knee/Hip Exercises: Seated   Other Seated Knee/Hip Exercises Knee flexion AAROM (R pushes L into flexion) 10X 10 seconds and tailgate knee flexion 1 minute      Knee/Hip Exercises: Supine   Quad Sets Strengthening;Left;10 reps    Quad Sets Limitations 10 seconds with overpressure      Knee/Hip Exercises: Prone   Hamstring Curl 2 sets;10 reps    Hamstring Curl Limitations 5 #    Prone Knee Hang 2 minutes    Prone Knee Hang Weights (lbs) 5 #      Manual Therapy   Manual therapy comments PROM: knee extension with overpressure, flexion with distraction mobs                       PT Short Term Goals - 01/21/21 0912       PT SHORT TERM GOAL #1   Title Pt will be independent in his initial HEP.    Status Achieved      PT SHORT TERM GOAL #2   Title pt will improve his 5 time sit to stand to </= 14 seconds with no UE support    Status Achieved               PT Long Term Goals - 01/30/21 0930       PT LONG TERM GOAL #1   Title Pt will be independent in his advanced HEP.    Status On-going      PT LONG TERM GOAL #2   Title Pt will be able to amb community surfaces with no device safely for >/= 1000 feet.    Status Achieved      PT LONG TERM GOAL #3   Title Pt will improve his left LE strength to >/= 4+/5 to improve function    Status On-going      PT LONG TERM GOAL #4   Title Pt will improve his left knee ROM 0-120 degrees actively.    Baseline -3 to 99 degrees on 01/30/2021    Time 4    Period Weeks    Status On-going    Target Date 02/28/21      PT LONG TERM GOAL #5   Title Pt will be able to navigate 1 flight of steps with single hand rail with pain </= 2/10.    Status Achieved      PT LONG TERM GOAL #6   Title Pt will improve his FOTO to >/= 55.    Status On-going                   Plan - 02/04/21 [id number]     Clinical Impression Statement Pt still progressing with overall strengthening and AROM. Pt AROM arc measured today 4-95  degrees. Pt reports going to gym 3 days of the week, working  on bike, upper body and core strengthening. Conitnue skilled PT to maximize pt'f function.    Personal Factors and Comorbidities Comorbidity 3+    Comorbidities back surgery, HTN, OA    Examination-Activity Limitations Lift;Transfers;Stairs;Stand;Squat    Examination-Participation Restrictions Other;Community Activity    Stability/Clinical Decision Making Stable/Uncomplicated    Rehab Potential Excellent    PT Frequency 2x / week    PT Duration 12 weeks    PT Treatment/Interventions ADLs/Self Care Home  Management;Cryotherapy;Electrical Stimulation;Gait training;Stair training;Functional mobility training;Therapeutic activities;Therapeutic exercise;Balance training;Neuromuscular re-education;Patient/family education;Manual techniques;Passive range of motion;Taping;Vasopneumatic Device    PT Next Visit Plan AROM, quadriceps strength, stairs, balance    PT Home Exercise Plan Access Code: [email or web address]    Consulted and Agree with Plan of Care Patient             Patient will benefit from skilled therapeutic intervention in order to improve the following deficits and impairments:  Pain, Difficulty walking, Decreased range of motion, Decreased mobility, Decreased strength, Increased edema, Decreased balance, Impaired flexibility, Decreased activity tolerance  Visit Diagnosis: Muscle weakness (generalized)  Stiffness of left knee, not elsewhere classified  Localized edema  Difficulty walking  Acute pain of left knee     Problem List Patient Active Problem List   Diagnosis Date Noted   Status post laparoscopic appendectomy 01/14/2021   H/O total knee replacement, left 11/11/2020   Leiomyoma 11/08/2020   Unilateral primary osteoarthritis, left knee 10/17/2020   Arthritis 09/26/2018   Onychomycosis 09/26/2018   Class 1 obesity due to excess calories without serious comorbidity with body mass index (BMI) of 32.0 to 32.9 in adult 09/06/2017   Hyperlipidemia LDL goal <100 08/31/2016   OSA on CPAP 08/31/2016   Premature ejaculation 08/19/2011    Oretha Caprice, PT, MPT 02/04/2021, 9:41 AM  [facility] [address] [place], [place], [address] Phone: [phone]   Fax:  [phone]  Name: KANYE VENECIA MRN: [id number] Date of Birth: 1965-06-22

## 2021-02-06 ENCOUNTER — Ambulatory Visit: Payer: Federal, State, Local not specified - PPO | Admitting: Rehabilitative and Restorative Service Providers"

## 2021-02-06 ENCOUNTER — Other Ambulatory Visit: Payer: Self-pay

## 2021-02-06 ENCOUNTER — Encounter: Payer: Self-pay | Admitting: Rehabilitative and Restorative Service Providers"

## 2021-02-06 DIAGNOSIS — R262 Difficulty in walking, not elsewhere classified: Secondary | ICD-10-CM | POA: Diagnosis not present

## 2021-02-06 DIAGNOSIS — R600 Localized edema: Secondary | ICD-10-CM | POA: Diagnosis not present

## 2021-02-06 DIAGNOSIS — M25662 Stiffness of left knee, not elsewhere classified: Secondary | ICD-10-CM

## 2021-02-06 DIAGNOSIS — M6281 Muscle weakness (generalized): Secondary | ICD-10-CM | POA: Diagnosis not present

## 2021-02-06 NOTE — Therapy (Signed)
[facility] [address] [place], [place], [address] Phone: [phone]   Fax:  [phone]  Physical Therapy Treatment/Progress Note  Patient Details  Name: Corey [name] MRN: [id number] Date of Birth: 28-Mar-1966 Referring Provider (PT): Benjiman Core PA-C  Progress Note Reporting Period 12/03/2020 to 02/06/2021  See note below for Objective Data and Assessment of Progress/Goals.     Encounter Date: 02/06/2021   PT End of Session - 02/06/21 0928     Visit Number 13    Number of Visits 24    Date for PT Re-Evaluation 03/20/21    Authorization Type BCBS    Authorization Time Period Progress Note sent at 9th visit    Progress Note Due on Visit 23    PT Start Time 0845    PT Stop Time 0940    PT Time Calculation (min) 55 min    Activity Tolerance Patient tolerated treatment well;No increased pain    Behavior During Therapy WFL for tasks assessed/performed             Past Medical History:  Diagnosis Date   Arthritis    knee, left   ED (erectile dysfunction)    Hyperlipidemia    Hypertension    Sleep apnea    CPAP - not using lately due to recall     Past Surgical History:  Procedure Laterality Date   ANKLE BONE SURGERY     BACK SURGERY     COLLAR BONE SURGEY     COLONOSCOPY     FRACTURE SURGERY     HERNIA REPAIR     KNEE ARTHROSCOPY Left 2006   LAPAROSCOPIC APPENDECTOMY N/A 01/13/2021   Procedure: APPENDECTOMY LAPAROSCOPIC WITH LYSIS OF ADHESIONS;  Surgeon: Michael Boston, MD;  Location: [facility];  Service: General;  Laterality: N/A;   LUMBAR MICRODISCECTOMY     POLYPECTOMY     TOTAL KNEE ARTHROPLASTY Left 11/11/2020   Procedure: LEFT TOTAL KNEE ARTHROPLASTY;  Surgeon: Marybelle Killings, MD;  Location: [facility];  Service: Orthopedics;  Laterality: Left;   UMBILICAL HERNIA REPAIR      There were no vitals filed for this visit.   Subjective Assessment - 02/06/21 0850     Subjective [name] continues his excellent HEP compliance.   Tramedol PRN before PT only, otherwise no pain meds.  Sleeping is good.  Stairs are no problem.  Swelling with too much WB and a little stiff in bending but otherwise good.    Pertinent History Back surgery, HTN, OA,    Limitations Walking;House hold activities;Lifting    How long can you sit comfortably? Stiffness when he first stands-up    How long can you stand comfortably? Couple hours (was 30 minutes)    How long can you walk comfortably? 45 minutes (was short errands like the grocery store)    Diagnostic tests X-ray    Patient Stated Goals Decrease edema and return to work October 29.    Currently in Pain? No/denies    Pain Score 0-No pain    Pain Location Knee    Pain Orientation Left    Pain Descriptors / Indicators Tightness    Pain Type Surgical pain    Pain Radiating Towards NA    Pain Onset More than a month ago    Pain Frequency Rarely    Aggravating Factors  Edema increases with too much WB.  End range AROM stiffness.    Pain Relieving Factors Exercises    Effect of Pain on Daily  Activities Out of work until October 29 and edema with too much WB.    Multiple Pain Sites No                [facility] PT Assessment - 02/06/21 0001       AROM   Overall AROM Comments Supine    Right/Left Knee Left    Left Knee Extension -4    Left Knee Flexion 96                           [facility] Adult PT Treatment/Exercise - 02/06/21 0001       Exercises   Exercises Knee/Hip      Knee/Hip Exercises: Stretches   Other Knee/Hip Stretches Prone knee extension stretch with rolled up towels above knees and 8# weight on L heel 3 minutes      Knee/Hip Exercises: Aerobic   Recumbent Bike Seat 11 for 9 minutes      Knee/Hip Exercises: Machines for Strengthening   Total Gym Leg Press 150# L leg only push into full extension and stretch into flexion (5 seconds each)      Knee/Hip Exercises: Seated   Other Seated Knee/Hip Exercises Knee flexion AAROM (R pushes L into  flexion) 10X 10 seconds and tailgate knee flexion 1 minute      Knee/Hip Exercises: Supine   Quad Sets Strengthening;Left;2 sets;10 reps;Limitations    Quad Sets Limitations 5 seconds      Modalities   Modalities Vasopneumatic      Vasopneumatic   Number Minutes Vasopneumatic  10 minutes    Vasopnuematic Location  Knee    Vasopneumatic Pressure Medium    Vasopneumatic Temperature  34      Manual Therapy   Manual therapy comments Extension with PT overpressure 5X 10 seconds                     PT Education - 02/06/21 0924     Education Details Reviewed the focus on 2 knee flexion and 2 knee extension AROM exeercises with HEP to complement gym program focused on quadriceps strengthening for edema control.    Person(s) Educated Patient    Methods Explanation;Demonstration;Tactile cues;Verbal cues    Comprehension Returned demonstration;Need further instruction;Tactile cues required;Verbalized understanding;Verbal cues required              PT Short Term Goals - 01/21/21 0912       PT SHORT TERM GOAL #1   Title Pt will be independent in his initial HEP.    Status Achieved      PT SHORT TERM GOAL #2   Title pt will improve his 5 time sit to stand to </= 14 seconds with no UE support    Status Achieved               PT Long Term Goals - 02/06/21 0925       PT LONG TERM GOAL #1   Title Pt will be independent in his advanced HEP.    Baseline Doing well with gym and HEP.  AROM still needs work.    Time 6    Period Weeks    Status On-going    Target Date 03/20/21      PT LONG TERM GOAL #2   Title Pt will be able to amb community surfaces with no device safely for >/= 1000 feet.    Status Achieved      PT LONG  TERM GOAL #3   Title Pt will improve his left LE strength to >/= 4+/5 to improve function    Baseline Edema with too much WB suggests remaining quadriceps weakness.    Time 6    Period Weeks    Status On-going    Target Date 03/20/21       PT LONG TERM GOAL #4   Title Pt will improve his left knee ROM 0-120 degrees actively.    Baseline -4 to 96 degrees on 02/06/2021    Time 6    Period Weeks    Status On-going    Target Date 03/20/21      PT LONG TERM GOAL #5   Title Pt will be able to navigate 1 flight of steps with single hand rail with pain </= 2/10.    Status Achieved      PT LONG TERM GOAL #6   Title Pt will improve his FOTO to >/= 55.    Baseline 58 (was 33)    Status Achieved                   Plan - 02/06/21 0928     Clinical Impression Statement Lejend continues to give great effort with his post-TKA PT.  Although improved, knee AROM (flexion and extension), quadriceps strength and weight-bearing function will benefit from continued work.  Because he is not returning to work until October and because of remaining impairments, he will benefit from continued supervised PT.    Personal Factors and Comorbidities Comorbidity 3+    Comorbidities back surgery, HTN, OA    Examination-Activity Limitations Lift;Transfers;Stairs;Stand;Squat    Examination-Participation Restrictions Other;Community Activity    Stability/Clinical Decision Making Stable/Uncomplicated    Rehab Potential Excellent    PT Frequency 2x / week    PT Duration 6 weeks    PT Treatment/Interventions ADLs/Self Care Home Management;Cryotherapy;Electrical Stimulation;Gait training;Stair training;Functional mobility training;Therapeutic activities;Therapeutic exercise;Balance training;Neuromuscular re-education;Patient/family education;Manual techniques;Passive range of motion;Taping;Vasopneumatic Device    PT Next Visit Plan Try 90-30 knee extension machine with slow eccentrics for quadriceps strength and edema control    PT Home Exercise Plan Access Code: [email or web address]    Consulted and Agree with Plan of Care Patient             Patient will benefit from skilled therapeutic intervention in order to improve the following deficits and  impairments:  Pain, Difficulty walking, Decreased range of motion, Decreased mobility, Decreased strength, Increased edema, Decreased balance, Impaired flexibility, Decreased activity tolerance  Visit Diagnosis: Difficulty in walking, not elsewhere classified  Difficulty walking  Localized edema  Muscle weakness (generalized)  Stiffness of left knee, not elsewhere classified     Problem List Patient Active Problem List   Diagnosis Date Noted   Status post laparoscopic appendectomy 01/14/2021   H/O total knee replacement, left 11/11/2020   Leiomyoma 11/08/2020   Unilateral primary osteoarthritis, left knee 10/17/2020   Arthritis 09/26/2018   Onychomycosis 09/26/2018   Class 1 obesity due to excess calories without serious comorbidity with body mass index (BMI) of 32.0 to 32.9 in adult 09/06/2017   Hyperlipidemia LDL goal <100 08/31/2016   OSA on CPAP 08/31/2016   Premature ejaculation 08/19/2011    Farley Ly, PT, MPT 02/06/2021, 10:34 AM  [facility] [address] [place], [place], [address] Phone: [phone]   Fax:  [phone]  Name: RUSSELL HOUSMAN MRN: [id number] Date of Birth: 1965/12/26

## 2021-02-06 NOTE — Patient Instructions (Signed)
Access Code: [email or web address] URL: [email or web address] Date: 02/06/2021 Prepared by: Vista Mink  Exercises Sit to Stand - 3-4 x daily - 7 x weekly - 2 sets - 10 reps Supine Active Straight Leg Raise - 3-4 x daily - 7 x weekly - 2 sets - 10 reps Supine Heel Slide with Strap - 3-4 x daily - 7 x weekly - 2 sets - 10 reps Seated Knee Flexion AAROM - 3-4 x daily - 7 x weekly - 2 sets - 10 reps - 5-10 second hold Prone Knee Extension Hang - 3-4 x daily - 7 x weekly - 3-5 minutes, progressing to 10 minutes hold Supine Quadricep Sets - 2-3 x daily - 7 x weekly - 2-3 sets - 10 reps - 5 second hold   Focus on the last 4 at home!

## 2021-02-10 DIAGNOSIS — G4733 Obstructive sleep apnea (adult) (pediatric): Secondary | ICD-10-CM | POA: Diagnosis not present

## 2021-02-11 ENCOUNTER — Ambulatory Visit: Payer: Federal, State, Local not specified - PPO | Admitting: Physical Therapy

## 2021-02-11 ENCOUNTER — Other Ambulatory Visit: Payer: Self-pay

## 2021-02-11 DIAGNOSIS — M25562 Pain in left knee: Secondary | ICD-10-CM

## 2021-02-11 DIAGNOSIS — R600 Localized edema: Secondary | ICD-10-CM

## 2021-02-11 DIAGNOSIS — M25662 Stiffness of left knee, not elsewhere classified: Secondary | ICD-10-CM | POA: Diagnosis not present

## 2021-02-11 DIAGNOSIS — R262 Difficulty in walking, not elsewhere classified: Secondary | ICD-10-CM

## 2021-02-11 NOTE — Therapy (Signed)
[facility] [address] [place], [place], [address] Phone: [phone]   Fax:  [phone]  Physical Therapy Treatment  Patient Details  Name: Corey [name] MRN: [id number] Date of Birth: 05-10-1966 Referring Provider (PT): Benjiman Core PA-C   Encounter Date: 02/11/2021   PT End of Session - 02/11/21 1021     Visit Number 14    Number of Visits 24    Date for PT Re-Evaluation 03/20/21    Authorization Type BCBS    Authorization Time Period Progress Note sent at 9th visit, PN sent at 14th visit on 02/11/2021    Progress Note Due on Visit 24    PT Start Time 0845    PT Stop Time 0925    PT Time Calculation (min) 40 min    Activity Tolerance Patient tolerated treatment well;No increased pain    Behavior During Therapy WFL for tasks assessed/performed             Past Medical History:  Diagnosis Date   Arthritis    knee, left   ED (erectile dysfunction)    Hyperlipidemia    Hypertension    Sleep apnea    CPAP - not using lately due to recall     Past Surgical History:  Procedure Laterality Date   ANKLE BONE SURGERY     BACK SURGERY     COLLAR BONE SURGEY     COLONOSCOPY     FRACTURE SURGERY     HERNIA REPAIR     KNEE ARTHROSCOPY Left 2006   LAPAROSCOPIC APPENDECTOMY N/A 01/13/2021   Procedure: APPENDECTOMY LAPAROSCOPIC WITH LYSIS OF ADHESIONS;  Surgeon: Michael Boston, MD;  Location: [facility];  Service: General;  Laterality: N/A;   LUMBAR MICRODISCECTOMY     POLYPECTOMY     TOTAL KNEE ARTHROPLASTY Left 11/11/2020   Procedure: LEFT TOTAL KNEE ARTHROPLASTY;  Surgeon: Marybelle Killings, MD;  Location: [place];  Service: Orthopedics;  Laterality: Left;   UMBILICAL HERNIA REPAIR      There were no vitals filed for this visit.       [facility] PT Assessment - 02/11/21 0001       Assessment   Medical Diagnosis [id number]    Referring Provider (PT) Benjiman Core PA-C    Onset Date/Surgical Date 11/11/20      AROM   Overall AROM Comments  supine    Right/Left Knee Left    Left Knee Extension -5    Left Knee Flexion 100                           [facility] Adult PT Treatment/Exercise - 02/11/21 0001       Neuro Re-ed    Neuro Re-ed Details  SLS on Airex: 6 balance pods 20 second intervals (times: 20, 22, 24, 22, 22, 23) average reaction 704      Exercises   Exercises Knee/Hip      Knee/Hip Exercises: Aerobic   Recumbent Bike bike/UBE: (no UE used) L4.5 x 8 minutes      Knee/Hip Exercises: Machines for Strengthening   Cybex Knee Extension L LE only 15# 3x10    Cybex Knee Flexion L LE only 25# 3 x10    Total Gym Leg Press 150# L leg only 3 x 15      Knee/Hip Exercises: Standing   Other Standing Knee Exercises TRX squats 2x10 (goat for bottom to tap low step stool) unable to reach it today  pt about 7 inches      Knee/Hip Exercises: Seated   Long Arc Quad Strengthening;Left;2 sets;10 reps;Weights    Long Arc Quad Weight 8 lbs.      Knee/Hip Exercises: Prone   Prone Knee Hang 3 minutes    Prone Knee Hang Weights (lbs) 13#      Manual Therapy   Manual therapy comments PROM: flexion and extension with overpressures                       PT Short Term Goals - 01/21/21 0912       PT SHORT TERM GOAL #1   Title Pt will be independent in his initial HEP.    Status Achieved      PT SHORT TERM GOAL #2   Title pt will improve his 5 time sit to stand to </= 14 seconds with no UE support    Status Achieved               PT Long Term Goals - 02/11/21 1025       PT LONG TERM GOAL #1   Title Pt will be independent in his advanced HEP.    Baseline Doing well with gym and HEP.  AROM still needs work.    Status On-going      PT LONG TERM GOAL #2   Title Pt will be able to amb community surfaces with no device safely for >/= 1000 feet.    Status Achieved      PT LONG TERM GOAL #3   Title Pt will improve his left LE strength to >/= 4+/5 to improve function    Status On-going       PT LONG TERM GOAL #4   Title Pt will improve his left knee ROM 0-120 degrees actively.    Baseline AROM arc 5-100 degrees on 02/11/2021    Status On-going      PT LONG TERM GOAL #5   Title Pt will be able to navigate 1 flight of steps with single hand rail with pain </= 2/10.    Status Achieved      PT LONG TERM GOAL #6   Title Pt will improve his FOTO to >/= 55.    Status On-going                   Plan - 02/11/21 1022     Clinical Impression Statement Pt is making great progress with overall strength and funcitonal mobility. Pt is currently navigating steps with step over step pattern with no hand rails. Pt is still progressing with left knee AROM todays arc was 5-100 degrees. Pt is compliant in his HEP and has began working out at his gym. Will conitnue to progress toward LTG's to maximize funciton.    Personal Factors and Comorbidities Comorbidity 3+    Comorbidities back surgery, HTN, OA    Examination-Activity Limitations Lift;Transfers;Stairs;Stand;Squat    Examination-Participation Restrictions Other;Community Activity    Stability/Clinical Decision Making Stable/Uncomplicated    Rehab Potential Excellent    PT Frequency 2x / week    PT Duration 6 weeks    PT Treatment/Interventions ADLs/Self Care Home Management;Cryotherapy;Electrical Stimulation;Gait training;Stair training;Functional mobility training;Therapeutic activities;Therapeutic exercise;Balance training;Neuromuscular re-education;Patient/family education;Manual techniques;Passive range of motion;Taping;Vasopneumatic Device    PT Next Visit Plan continue to progress with machines, strengthening and AROM progression    PT Home Exercise Plan Access Code: [email or web address]    Consulted and Agree with Plan of Care Patient  Patient will benefit from skilled therapeutic intervention in order to improve the following deficits and impairments:  Pain, Difficulty walking, Decreased range of motion, Decreased  mobility, Decreased strength, Increased edema, Decreased balance, Impaired flexibility, Decreased activity tolerance  Visit Diagnosis: Difficulty walking  Stiffness of left knee, not elsewhere classified  Acute pain of left knee  Localized edema     Problem List Patient Active Problem List   Diagnosis Date Noted   Status post laparoscopic appendectomy 01/14/2021   H/O total knee replacement, left 11/11/2020   Leiomyoma 11/08/2020   Unilateral primary osteoarthritis, left knee 10/17/2020   Arthritis 09/26/2018   Onychomycosis 09/26/2018   Class 1 obesity due to excess calories without serious comorbidity with body mass index (BMI) of 32.0 to 32.9 in adult 09/06/2017   Hyperlipidemia LDL goal <100 08/31/2016   OSA on CPAP 08/31/2016   Premature ejaculation 08/19/2011    Oretha Caprice, PT, MPT 02/11/2021, 10:39 AM  [facility] [address] [place], [place], [address] Phone: [phone]   Fax:  [phone]  Name: Corey [name] MRN: [id number] Date of Birth: 01/07/1966

## 2021-02-12 ENCOUNTER — Ambulatory Visit (INDEPENDENT_AMBULATORY_CARE_PROVIDER_SITE_OTHER): Payer: Federal, State, Local not specified - PPO | Admitting: Orthopaedic Surgery

## 2021-02-12 ENCOUNTER — Ambulatory Visit: Payer: Self-pay

## 2021-02-12 VITALS — BP 151/96 | HR 83 | Ht 69.0 in | Wt 225.0 lb

## 2021-02-12 DIAGNOSIS — Z96652 Presence of left artificial knee joint: Secondary | ICD-10-CM | POA: Diagnosis not present

## 2021-02-12 NOTE — Progress Notes (Signed)
Office Visit Note   Patient: Corey [name]           Date of Birth: March 29, 1966           MRN: [id number] Visit Date: 02/12/2021              Requested by: Denita Lung, MD [address] [place],  [place] [address] PCP: Denita Lung, MD   Assessment & Plan: Visit Diagnoses:  1. S/P TKR (total knee replacement), left     Plan: X-rays reviewed with patient.  He can work continue prone positioning to get the last few degrees of extension.  He still working onFlexion has good quad strength.  Work note given for work resumption on 03/01/2021 without restrictions.  He is happy with the results of surgery. I will recheck him in 3 months no x-ray needed on return.  Follow-Up Instructions: No follow-ups on file.   Orders:  Orders Placed This Encounter  Procedures   XR Knee 1-2 Views Left   No orders of the defined types were placed in this encounter.     Procedures: No procedures performed   Clinical Data: No additional findings.   Subjective: Chief Complaint  Patient presents with   Left Knee - Follow-up, Fracture    11/11/2020 left TKA    HPI follow-up left total knee arthroplasty.  Medial femoral condyle cortical crack which is healed by x-rays.  He is in therapy and range of motion is 4 to 100 degrees.  He is going to the gym as well.  Still notices some swelling in the knee walking without a cane.  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  Sometimes he does some sitting.  Review of Systems all other systems noncontributory.   Objective: Vital Signs: BP (!) 151/96   Pulse 83   Ht '5\' 9"'$  (1.753 m)   Wt 225 lb (102.1 kg)   BMI 33.23 kg/m   Physical Exam  Ortho Exam  Specialty Comments:  No specialty comments available.  Imaging: No results found.   PMFS History: Patient Active Problem List   Diagnosis Date Noted   Status post laparoscopic appendectomy 01/14/2021   H/O total knee replacement, left  11/11/2020   Leiomyoma 11/08/2020   Unilateral primary osteoarthritis, left knee 10/17/2020   Arthritis 09/26/2018   Onychomycosis 09/26/2018   Class 1 obesity due to excess calories without serious comorbidity with body mass index (BMI) of 32.0 to 32.9 in adult 09/06/2017   Hyperlipidemia LDL goal <100 08/31/2016   OSA on CPAP 08/31/2016   Premature ejaculation 08/19/2011   Past Medical History:  Diagnosis Date   Arthritis    knee, left   ED (erectile dysfunction)    Hyperlipidemia    Hypertension    Sleep apnea    CPAP - not using lately due to recall     Family History  Problem Relation Age of Onset   Alzheimer's disease Mother    Stevens-Johnson syndrome Father    Autism Brother    Colon cancer Other    Colon polyps Neg Hx    Esophageal cancer Neg Hx    Rectal cancer Neg Hx    Stomach cancer Neg Hx     Past Surgical History:  Procedure Laterality Date   ANKLE BONE SURGERY     BACK SURGERY     COLLAR BONE SURGEY     COLONOSCOPY     FRACTURE SURGERY  HERNIA REPAIR     KNEE ARTHROSCOPY Left 2006   LAPAROSCOPIC APPENDECTOMY N/A 01/13/2021   Procedure: APPENDECTOMY LAPAROSCOPIC WITH LYSIS OF ADHESIONS;  Surgeon: Michael Boston, MD;  Location: [facility];  Service: General;  Laterality: N/A;   LUMBAR MICRODISCECTOMY     POLYPECTOMY     TOTAL KNEE ARTHROPLASTY Left 11/11/2020   Procedure: LEFT TOTAL KNEE ARTHROPLASTY;  Surgeon: Marybelle Killings, MD;  Location: [place];  Service: Orthopedics;  Laterality: Left;   UMBILICAL HERNIA REPAIR     Social History   Occupational History   Occupation: Engineer, agricultural  Tobacco Use   Smoking status: Never   Smokeless tobacco: Never  Vaping Use   Vaping Use: Never used  Substance and Sexual Activity   Alcohol use: Yes    Alcohol/week: 3.0 standard drinks    Types: 3 Glasses of wine per week    Comment: occasional   Drug use: Not Currently    Types: Marijuana   Sexual activity: Yes

## 2021-02-18 ENCOUNTER — Ambulatory Visit: Payer: Federal, State, Local not specified - PPO | Admitting: Physical Therapy

## 2021-02-18 ENCOUNTER — Other Ambulatory Visit: Payer: Self-pay

## 2021-02-18 ENCOUNTER — Encounter: Payer: Self-pay | Admitting: Physical Therapy

## 2021-02-18 DIAGNOSIS — M25662 Stiffness of left knee, not elsewhere classified: Secondary | ICD-10-CM

## 2021-02-18 DIAGNOSIS — R262 Difficulty in walking, not elsewhere classified: Secondary | ICD-10-CM | POA: Diagnosis not present

## 2021-02-18 DIAGNOSIS — R600 Localized edema: Secondary | ICD-10-CM

## 2021-02-18 DIAGNOSIS — M6281 Muscle weakness (generalized): Secondary | ICD-10-CM

## 2021-02-18 DIAGNOSIS — M25562 Pain in left knee: Secondary | ICD-10-CM

## 2021-02-18 NOTE — Therapy (Signed)
[facility] Physical Therapy [address] [place], [place], [address] Phone: [phone]   Fax:  [phone]  Physical Therapy Treatment  Patient Details  Name: Corey [name] MRN: [id number] Date of Birth: Nov 10, 1965 Referring Provider (PT): Benjiman Core PA-C   Encounter Date: 02/18/2021   PT End of Session - 02/18/21 0857     Visit Number 15    Number of Visits 24    Date for PT Re-Evaluation 03/20/21    Authorization Type BCBS    Authorization Time Period Progress Note sent at 9th visit, PN sent at 14th visit on 02/11/2021    PT Start Time 0846    PT Stop Time 0925    PT Time Calculation (min) 39 min    Activity Tolerance Patient tolerated treatment well;No increased pain    Behavior During Therapy WFL for tasks assessed/performed             Past Medical History:  Diagnosis Date   Arthritis    knee, left   ED (erectile dysfunction)    Hyperlipidemia    Hypertension    Sleep apnea    CPAP - not using lately due to recall     Past Surgical History:  Procedure Laterality Date   ANKLE BONE SURGERY     BACK SURGERY     COLLAR BONE SURGEY     COLONOSCOPY     FRACTURE SURGERY     HERNIA REPAIR     KNEE ARTHROSCOPY Left 2006   LAPAROSCOPIC APPENDECTOMY N/A 01/13/2021   Procedure: APPENDECTOMY LAPAROSCOPIC WITH LYSIS OF ADHESIONS;  Surgeon: Michael Boston, MD;  Location: [facility];  Service: General;  Laterality: N/A;   LUMBAR MICRODISCECTOMY     POLYPECTOMY     TOTAL KNEE ARTHROPLASTY Left 11/11/2020   Procedure: LEFT TOTAL KNEE ARTHROPLASTY;  Surgeon: Marybelle Killings, MD;  Location: [place];  Service: Orthopedics;  Laterality: Left;   UMBILICAL HERNIA REPAIR      There were no vitals filed for this visit.   Subjective Assessment - 02/18/21 0854     Subjective Pt reporting no pain upon arrival. Pt stating he has not been taking any pain meds.    Pertinent History Back surgery, HTN, OA,    Limitations Walking;House hold activities;Lifting     Patient Stated Goals Decrease edema and return to work October 29.    Currently in Pain? No/denies                [facility] PT Assessment - 02/18/21 0001       Assessment   Medical Diagnosis M01.027    Referring Provider (PT) Benjiman Core PA-C    Onset Date/Surgical Date 11/11/20      AROM   Right/Left Knee Left    Left Knee Extension -5    Left Knee Flexion 105                           [facility] Adult PT Treatment/Exercise - 02/18/21 0001       Exercises   Exercises Knee/Hip      Knee/Hip Exercises: Aerobic   Recumbent Bike bike/UBE: (no UE used) L4.5 x 8 minutes      Knee/Hip Exercises: Machines for Strengthening   Cybex Knee Extension L LE only 25# 3x10    Cybex Knee Flexion L LE only  25# 3x10    Total Gym Leg Press 156# L LE only 3x10    Other Machine calf raises  on Leg Press with 150# x15      Knee/Hip Exercises: Standing   Other Standing Knee Exercises golfer lift iwth 15# kettle bell on L LE x 15      Manual Therapy   Manual therapy comments extension with overpressure in prone and supine                       PT Short Term Goals - 01/21/21 0912       PT SHORT TERM GOAL #1   Title Pt will be independent in his initial HEP.    Status Achieved      PT SHORT TERM GOAL #2   Title pt will improve his 5 time sit to stand to </= 14 seconds with no UE support    Status Achieved               PT Long Term Goals - 02/18/21 0857       PT LONG TERM GOAL #1   Title Pt will be independent in his advanced HEP.    Status On-going      PT LONG TERM GOAL #2   Title Pt will be able to amb community surfaces with no device safely for >/= 1000 feet.    Status Achieved      PT LONG TERM GOAL #3   Title Pt will improve his left LE strength to >/= 4+/5 to improve function    Status On-going      PT LONG TERM GOAL #4   Title Pt will improve his left knee ROM 0-120 degrees actively.    Status On-going      PT LONG TERM GOAL #5    Title Pt will be able to navigate 1 flight of steps with single hand rail with pain </= 2/10.    Status Achieved      PT LONG TERM GOAL #6   Title Pt will improve his FOTO to >/= 55.    Status On-going                   Plan - 02/18/21 0900     Clinical Impression Statement Pt reporting he is going back to work on 03/01/2021 per MD recommendation. We discussed pausing therapy for about 2 weeks once pt's returns to work. Pt AROM remains 5-105 degrees arc. Continue skilled PT to progress to maximize function.    Personal Factors and Comorbidities Comorbidity 3+    Comorbidities back surgery, HTN, OA    Examination-Activity Limitations Lift;Transfers;Stairs;Stand;Squat    Examination-Participation Restrictions Other;Community Activity    Stability/Clinical Decision Making Stable/Uncomplicated    Rehab Potential Excellent    PT Frequency 2x / week    PT Duration 6 weeks    PT Treatment/Interventions ADLs/Self Care Home Management;Cryotherapy;Electrical Stimulation;Gait training;Stair training;Functional mobility training;Therapeutic activities;Therapeutic exercise;Balance training;Neuromuscular re-education;Patient/family education;Manual techniques;Passive range of motion;Taping;Vasopneumatic Device    PT Next Visit Plan continue to progress with machines, strengthening and AROM progression    PT Home Exercise Plan Access Code: [email or web address]    Consulted and Agree with Plan of Care Patient             Patient will benefit from skilled therapeutic intervention in order to improve the following deficits and impairments:  Pain, Difficulty walking, Decreased range of motion, Decreased mobility, Decreased strength, Increased edema, Decreased balance, Impaired flexibility, Decreased activity tolerance  Visit Diagnosis: Difficulty walking  Stiffness of left knee, not elsewhere classified  Acute pain of left  knee  Localized edema  Muscle weakness (generalized)     Problem  List Patient Active Problem List   Diagnosis Date Noted   Status post laparoscopic appendectomy 01/14/2021   H/O total knee replacement, left 11/11/2020   Leiomyoma 11/08/2020   Unilateral primary osteoarthritis, left knee 10/17/2020   Arthritis 09/26/2018   Onychomycosis 09/26/2018   Class 1 obesity due to excess calories without serious comorbidity with body mass index (BMI) of 32.0 to 32.9 in adult 09/06/2017   Hyperlipidemia LDL goal <100 08/31/2016   OSA on CPAP 08/31/2016   Premature ejaculation 08/19/2011    Oretha Caprice, PT, MPT 02/18/2021, 9:32 AM  [facility] Therapy [address] [place], [place], [address] Phone: [phone]   Fax:  [phone]  Name: Corey [name] MRN: [id number] Date of Birth: August 08, 1965

## 2021-02-20 ENCOUNTER — Encounter: Payer: Self-pay | Admitting: Rehabilitative and Restorative Service Providers"

## 2021-02-20 ENCOUNTER — Ambulatory Visit: Payer: Federal, State, Local not specified - PPO | Admitting: Rehabilitative and Restorative Service Providers"

## 2021-02-20 ENCOUNTER — Other Ambulatory Visit: Payer: Self-pay

## 2021-02-20 DIAGNOSIS — M25562 Pain in left knee: Secondary | ICD-10-CM

## 2021-02-20 DIAGNOSIS — R600 Localized edema: Secondary | ICD-10-CM

## 2021-02-20 DIAGNOSIS — M25662 Stiffness of left knee, not elsewhere classified: Secondary | ICD-10-CM

## 2021-02-20 DIAGNOSIS — M6281 Muscle weakness (generalized): Secondary | ICD-10-CM

## 2021-02-20 DIAGNOSIS — R262 Difficulty in walking, not elsewhere classified: Secondary | ICD-10-CM

## 2021-02-20 NOTE — Therapy (Signed)
[facility] [address] [place], [place], [address] Phone: [phone]   Fax:  [phone]  Physical Therapy Treatment  Patient Details  Name: Corey [name] MRN: [id number] Date of Birth: 09/21/1965 Referring Provider (PT): Benjiman Core PA-C   Encounter Date: 02/20/2021   PT End of Session - 02/20/21 1036     Visit Number 16    Number of Visits 24    Date for PT Re-Evaluation 03/20/21    Authorization Type BCBS    Authorization Time Period Progress Note sent at 9th visit, PN sent at 14th visit on 02/11/2021    Progress Note Due on Visit 24    PT Start Time 0845    PT Stop Time 0935    PT Time Calculation (min) 50 min    Activity Tolerance Patient tolerated treatment well;No increased pain    Behavior During Therapy WFL for tasks assessed/performed             Past Medical History:  Diagnosis Date   Arthritis    knee, left   ED (erectile dysfunction)    Hyperlipidemia    Hypertension    Sleep apnea    CPAP - not using lately due to recall     Past Surgical History:  Procedure Laterality Date   ANKLE BONE SURGERY     BACK SURGERY     COLLAR BONE SURGEY     COLONOSCOPY     FRACTURE SURGERY     HERNIA REPAIR     KNEE ARTHROSCOPY Left 2006   LAPAROSCOPIC APPENDECTOMY N/A 01/13/2021   Procedure: APPENDECTOMY LAPAROSCOPIC WITH LYSIS OF ADHESIONS;  Surgeon: Michael Boston, MD;  Location: [facility];  Service: General;  Laterality: N/A;   LUMBAR MICRODISCECTOMY     POLYPECTOMY     TOTAL KNEE ARTHROPLASTY Left 11/11/2020   Procedure: LEFT TOTAL KNEE ARTHROPLASTY;  Surgeon: Marybelle Killings, MD;  Location: [place];  Service: Orthopedics;  Laterality: Left;   UMBILICAL HERNIA REPAIR      There were no vitals filed for this visit.   Subjective Assessment - 02/20/21 0852     Subjective Corey [name] is not taking pain medication.  He does get increased edema with increased WB.    Pertinent History Back surgery, HTN, OA,    Limitations  Walking;House hold activities;Lifting    How long can you sit comfortably? Start-up stiffness    How long can you stand comfortably? 1 hour    How long can you walk comfortably? 30 minutes on treadmill    Patient Stated Goals Decrease edema and return to work October 1.    Currently in Pain? No/denies    Pain Score 0-No pain    Pain Location Knee    Pain Orientation Left    Pain Descriptors / Indicators Tightness    Pain Type Surgical pain    Pain Radiating Towards NA    Pain Onset More than a month ago    Pain Frequency Rarely    Aggravating Factors  Edema with too much WB.  End range tightness.    Pain Relieving Factors Exercises and ice.    Effect of Pain on Daily Activities Returns to work October 1.    Multiple Pain Sites No                [facility] PT Assessment - 02/20/21 0001       AROM   Overall AROM Comments Supine    Right/Left Knee Left    Left  Knee Extension -3    Left Knee Flexion 98                           [facility] Adult PT Treatment/Exercise - 02/20/21 0001       Exercises   Exercises Knee/Hip      Knee/Hip Exercises: Aerobic   Recumbent Bike Seat 11 for 8 minutes      Knee/Hip Exercises: Machines for Strengthening   Total Gym Leg Press 156# 20X L LE only slow eccentrics push into flexion and extension      Knee/Hip Exercises: Seated   Other Seated Knee/Hip Exercises Knee flexion AAROM (R pushes L into flexion) 10X 10 seconds and tailgate knee flexion 1 minute      Knee/Hip Exercises: Supine   Quad Sets Strengthening;Left;2 sets;10 reps;Limitations    Quad Sets Limitations 5 seconds      Knee/Hip Exercises: Prone   Prone Knee Hang 3 minutes    Prone Knee Hang Weights (lbs) 10#      Modalities   Modalities Vasopneumatic      Vasopneumatic   Number Minutes Vasopneumatic  10 minutes    Vasopnuematic Location  Knee    Vasopneumatic Pressure Medium    Vasopneumatic Temperature  34                     PT Education  - 02/20/21 3976     Education Details Reviewed exercises and AROM measures today.    Person(s) Educated Patient    Methods Explanation;Demonstration;Tactile cues;Verbal cues    Comprehension Verbalized understanding;Tactile cues required;Need further instruction;Returned demonstration;Verbal cues required              PT Short Term Goals - 01/21/21 0912       PT SHORT TERM GOAL #1   Title Pt will be independent in his initial HEP.    Status Achieved      PT SHORT TERM GOAL #2   Title pt will improve his 5 time sit to stand to </= 14 seconds with no UE support    Status Achieved               PT Long Term Goals - 02/20/21 1035       PT LONG TERM GOAL #1   Title Pt will be independent in his advanced HEP.    Time 4    Period Weeks    Status On-going    Target Date 03/20/21      PT LONG TERM GOAL #2   Title Pt will be able to amb community surfaces with no device safely for >/= 1000 feet.    Status Achieved      PT LONG TERM GOAL #3   Title Pt will improve his left LE strength to >/= 4+/5 to improve function    Status Achieved      PT LONG TERM GOAL #4   Title Pt will improve his left knee ROM 0-120 degrees actively.    Baseline -3 to 98 degrees 9/22/20222    Time 4    Period Weeks    Status On-going    Target Date 03/20/21      PT LONG TERM GOAL #5   Title Pt will be able to navigate 1 flight of steps with single hand rail with pain </= 2/10.    Status Achieved      PT LONG TERM GOAL #6   Title Pt will  improve his FOTO to >/= 55.    Status Achieved                   Plan - 02/20/21 1037     Clinical Impression Statement AROM is now -3 to 98 degrees.  Flexion AROM and quadriceps strength are the focus of Corey [name]'s HEP.  He is on track to meet long-term goals by 03/20/2021 and return to full-duty at the [employer].    Personal Factors and Comorbidities Comorbidity 3+    Comorbidities back surgery, HTN, OA    Examination-Activity Limitations  Lift;Transfers;Stairs;Stand;Squat    Examination-Participation Restrictions Other;Community Activity    Stability/Clinical Decision Making Stable/Uncomplicated    Rehab Potential Excellent    PT Frequency 2x / week    PT Duration 4 weeks    PT Treatment/Interventions ADLs/Self Care Home Management;Cryotherapy;Electrical Stimulation;Gait training;Stair training;Functional mobility training;Therapeutic activities;Therapeutic exercise;Balance training;Neuromuscular re-education;Patient/family education;Manual techniques;Passive range of motion;Taping;Vasopneumatic Device    PT Next Visit Plan Quadriceps strength, end-range AROM    PT Home Exercise Plan Access Code: [email or web address]    Consulted and Agree with Plan of Care Patient             Patient will benefit from skilled therapeutic intervention in order to improve the following deficits and impairments:  Pain, Difficulty walking, Decreased range of motion, Decreased mobility, Decreased strength, Increased edema, Decreased balance, Impaired flexibility, Decreased activity tolerance  Visit Diagnosis: Difficulty walking  Stiffness of left knee, not elsewhere classified  Acute pain of left knee  Localized edema  Muscle weakness (generalized)     Problem List Patient Active Problem List   Diagnosis Date Noted   Status post laparoscopic appendectomy 01/14/2021   H/O total knee replacement, left 11/11/2020   Leiomyoma 11/08/2020   Unilateral primary osteoarthritis, left knee 10/17/2020   Arthritis 09/26/2018   Onychomycosis 09/26/2018   Class 1 obesity due to excess calories without serious comorbidity with body mass index (BMI) of 32.0 to 32.9 in adult 09/06/2017   Hyperlipidemia LDL goal <100 08/31/2016   OSA on CPAP 08/31/2016   Premature ejaculation 08/19/2011    Farley Ly, PT, MPT 02/20/2021, 10:39 AM  [facility] [address] [place], [place], [address] Phone: [phone]   Fax:   [phone]  Name: Corey [name] MRN: [id number] Date of Birth: [birth year]

## 2021-02-20 NOTE — Patient Instructions (Signed)
Prone knee extension stretch, 2 knee flexion exercises and normal gym routine (bike, leg press, 90-30 knee extension machine).

## 2021-02-25 ENCOUNTER — Other Ambulatory Visit: Payer: Self-pay

## 2021-02-25 ENCOUNTER — Ambulatory Visit: Payer: Federal, State, Local not specified - PPO | Admitting: Physical Therapy

## 2021-02-25 ENCOUNTER — Encounter: Payer: Self-pay | Admitting: Physical Therapy

## 2021-02-25 DIAGNOSIS — R600 Localized edema: Secondary | ICD-10-CM

## 2021-02-25 DIAGNOSIS — R262 Difficulty in walking, not elsewhere classified: Secondary | ICD-10-CM

## 2021-02-25 DIAGNOSIS — M25662 Stiffness of left knee, not elsewhere classified: Secondary | ICD-10-CM

## 2021-02-25 DIAGNOSIS — M25562 Pain in left knee: Secondary | ICD-10-CM | POA: Diagnosis not present

## 2021-02-25 DIAGNOSIS — M6281 Muscle weakness (generalized): Secondary | ICD-10-CM

## 2021-02-25 NOTE — Therapy (Signed)
[facility] [address] [place], [place], [address] Phone: [phone]   Fax:  [phone]  Physical Therapy Treatment  Patient Details  Name: Corey [name] MRN: [id number] Date of Birth: 1965/11/02 Referring Provider (PT): Benjiman Core PA-C   Encounter Date: 02/25/2021   PT End of Session - 02/25/21 0946     Visit Number 17    Number of Visits 24    Date for PT Re-Evaluation 03/20/21    Authorization Type BCBS    Authorization Time Period Progress Note sent at 9th visit, PN sent at 14th visit on 02/11/2021    Progress Note Due on Visit 24    PT Start Time 0845    PT Stop Time 0927    PT Time Calculation (min) 42 min    Activity Tolerance Patient tolerated treatment well;No increased pain    Behavior During Therapy WFL for tasks assessed/performed             Past Medical History:  Diagnosis Date   Arthritis    knee, left   ED (erectile dysfunction)    Hyperlipidemia    Hypertension    Sleep apnea    CPAP - not using lately due to recall     Past Surgical History:  Procedure Laterality Date   ANKLE BONE SURGERY     BACK SURGERY     COLLAR BONE SURGEY     COLONOSCOPY     FRACTURE SURGERY     HERNIA REPAIR     KNEE ARTHROSCOPY Left 2006   LAPAROSCOPIC APPENDECTOMY N/A 01/13/2021   Procedure: APPENDECTOMY LAPAROSCOPIC WITH LYSIS OF ADHESIONS;  Surgeon: Michael Boston, MD;  Location: [facility];  Service: General;  Laterality: N/A;   LUMBAR MICRODISCECTOMY     POLYPECTOMY     TOTAL KNEE ARTHROPLASTY Left 11/11/2020   Procedure: LEFT TOTAL KNEE ARTHROPLASTY;  Surgeon: Marybelle Killings, MD;  Location: [place];  Service: Orthopedics;  Laterality: Left;   UMBILICAL HERNIA REPAIR      There were no vitals filed for this visit.   Subjective Assessment - 02/25/21 0930     Subjective Pt arriving reporting no pain and states his gym program is going well.    Pertinent History Back surgery, HTN, OA,    Limitations Walking;House hold  activities;Lifting    How long can you sit comfortably? Start-up stiffness    How long can you stand comfortably? 1 hour    How long can you walk comfortably? 30 minutes on treadmill    Diagnostic tests X-ray    Patient Stated Goals Decrease edema and return to work October 1.    Currently in Pain? No/denies                [facility] PT Assessment - 02/25/21 0001       Assessment   Medical Diagnosis E70.350    Referring Provider (PT) Benjiman Core PA-C      AROM   Overall AROM Comments supine    Left Knee Extension -3    Left Knee Flexion 106                           [facility] Adult PT Treatment/Exercise - 02/25/21 0001       Neuro Re-ed    Neuro Re-ed Details  Balance Pods spread out in a large semicircle approximately 4 feet across x 6 intervals each 30 seconds with 30 second rest break   average  24 taps in 30 seconds     Exercises   Exercises Knee/Hip      Knee/Hip Exercises: Aerobic   Recumbent Bike bike UBE combo: L4 x 8 minutes      Knee/Hip Exercises: Machines for Strengthening   Cybex Knee Extension L LE only: 35# 20 reps    Total Gym Leg Press 156# 20X L LE only slow eccentrics push into flexion and extension      Knee/Hip Exercises: Standing   Other Standing Knee Exercises lunges alternating right and left      Knee/Hip Exercises: Seated   Other Seated Knee/Hip Exercises sit to stand with Right LE extended to put more weight on left LE      Knee/Hip Exercises: Supine   Heel Slides Left;5 reps    Heel Slides Limitations prior to measurements                       PT Short Term Goals - 01/21/21 0912       PT SHORT TERM GOAL #1   Title Pt will be independent in his initial HEP.    Status Achieved      PT SHORT TERM GOAL #2   Title pt will improve his 5 time sit to stand to </= 14 seconds with no UE support    Status Achieved               PT Long Term Goals - 02/25/21 1043       PT LONG TERM GOAL #1   Title Pt  will be independent in his advanced HEP.    Baseline Doing well with gym and HEP.  AROM still needs work.    Status On-going      PT LONG TERM GOAL #2   Title Pt will be able to amb community surfaces with no device safely for >/= 1000 feet.    Status Achieved      PT LONG TERM GOAL #3   Title Pt will improve his left LE strength to >/= 4+/5 to improve function    Status Achieved      PT LONG TERM GOAL #4   Title Pt will improve his left knee ROM 0-120 degrees actively.    Baseline -3 to 106 degrees on 02/25/2021    Status On-going      PT LONG TERM GOAL #5   Title Pt will be able to navigate 1 flight of steps with single hand rail with pain </= 2/10.    Status Achieved      PT LONG TERM GOAL #6   Title Pt will improve his FOTO to >/= 55.    Status On-going                   Plan - 02/25/21 0946     Clinical Impression Statement Pt arriving today reporting no pain. Pt stating he has weaned off pain meds. Pt has been going to the local gym and working on the bike and strengthening. Pt progressing with AROM with today's measurement 3-106 degrees. Continue skilled PT to maximize function.    Personal Factors and Comorbidities Comorbidity 3+    Comorbidities back surgery, HTN, OA    Examination-Activity Limitations Lift;Transfers;Stairs;Stand;Squat    Examination-Participation Restrictions Other;Community Activity    Stability/Clinical Decision Making Stable/Uncomplicated    Rehab Potential Excellent    PT Frequency 2x / week    PT Duration 4 weeks    PT Treatment/Interventions ADLs/Self  Care Home Management;Cryotherapy;Electrical Stimulation;Gait training;Stair training;Functional mobility training;Therapeutic activities;Therapeutic exercise;Balance training;Neuromuscular re-education;Patient/family education;Manual techniques;Passive range of motion;Taping;Vasopneumatic Device    PT Next Visit Plan Quadriceps strength, end-range AROM, agility movements    PT Home  Exercise Plan Access Code: [email or web address]    Consulted and Agree with Plan of Care Patient             Patient will benefit from skilled therapeutic intervention in order to improve the following deficits and impairments:  Pain, Difficulty walking, Decreased range of motion, Decreased mobility, Decreased strength, Increased edema, Decreased balance, Impaired flexibility, Decreased activity tolerance  Visit Diagnosis: Difficulty walking  Stiffness of left knee, not elsewhere classified  Acute pain of left knee  Localized edema  Muscle weakness (generalized)     Problem List Patient Active Problem List   Diagnosis Date Noted   Status post laparoscopic appendectomy 01/14/2021   H/O total knee replacement, left 11/11/2020   Leiomyoma 11/08/2020   Unilateral primary osteoarthritis, left knee 10/17/2020   Arthritis 09/26/2018   Onychomycosis 09/26/2018   Class 1 obesity due to excess calories without serious comorbidity with body mass index (BMI) of 32.0 to 32.9 in adult 09/06/2017   Hyperlipidemia LDL goal <100 08/31/2016   OSA on CPAP 08/31/2016   Premature ejaculation 08/19/2011    Oretha Caprice, PT, MPT 02/25/2021, 10:47 AM  [facility] [address] [place], [place], [address] Phone: [phone]   Fax:  [phone]  Name: Corey [name] MRN: [id number] Date of Birth: [birth year]

## 2021-02-27 ENCOUNTER — Encounter: Payer: Self-pay | Admitting: Physical Therapy

## 2021-02-27 ENCOUNTER — Ambulatory Visit: Payer: Federal, State, Local not specified - PPO | Admitting: Physical Therapy

## 2021-02-27 ENCOUNTER — Other Ambulatory Visit: Payer: Self-pay

## 2021-02-27 DIAGNOSIS — M25662 Stiffness of left knee, not elsewhere classified: Secondary | ICD-10-CM

## 2021-02-27 DIAGNOSIS — M6281 Muscle weakness (generalized): Secondary | ICD-10-CM

## 2021-02-27 DIAGNOSIS — R600 Localized edema: Secondary | ICD-10-CM

## 2021-02-27 DIAGNOSIS — M25562 Pain in left knee: Secondary | ICD-10-CM

## 2021-02-27 DIAGNOSIS — R262 Difficulty in walking, not elsewhere classified: Secondary | ICD-10-CM

## 2021-02-27 NOTE — Therapy (Signed)
[facility] [address] [place], [place], [address] Phone: [phone]   Fax:  [phone]  Physical Therapy Treatment  Patient Details  Name: Corey [name] MRN: [id number] Date of Birth: [birth year] Referring Provider (PT): Benjiman Core PA-C   Encounter Date: 02/27/2021   PT End of Session - 02/27/21 0931     Visit Number 18    Number of Visits 24    Date for PT Re-Evaluation 03/20/21    Authorization Type BCBS    Authorization Time Period Progress Note sent at 9th visit, PN sent at 14th visit on 02/11/2021    Progress Note Due on Visit 24    PT Start Time 0847    PT Stop Time 0928    PT Time Calculation (min) 41 min    Activity Tolerance Patient tolerated treatment well;No increased pain    Behavior During Therapy WFL for tasks assessed/performed             Past Medical History:  Diagnosis Date   Arthritis    knee, left   ED (erectile dysfunction)    Hyperlipidemia    Hypertension    Sleep apnea    CPAP - not using lately due to recall     Past Surgical History:  Procedure Laterality Date   ANKLE BONE SURGERY     BACK SURGERY     COLLAR BONE SURGEY     COLONOSCOPY     FRACTURE SURGERY     HERNIA REPAIR     KNEE ARTHROSCOPY Left 2006   LAPAROSCOPIC APPENDECTOMY N/A 01/13/2021   Procedure: APPENDECTOMY LAPAROSCOPIC WITH LYSIS OF ADHESIONS;  Surgeon: Michael Boston, MD;  Location: [facility];  Service: General;  Laterality: N/A;   LUMBAR MICRODISCECTOMY     POLYPECTOMY     TOTAL KNEE ARTHROPLASTY Left 11/11/2020   Procedure: LEFT TOTAL KNEE ARTHROPLASTY;  Surgeon: Marybelle Killings, MD;  Location: [place];  Service: Orthopedics;  Laterality: Left;   UMBILICAL HERNIA REPAIR      There were no vitals filed for this visit.   Subjective Assessment - 02/27/21 0930     Subjective denies pain upon arrival, does still have some knee stiffness. He reports he will attempt to return to work in [employer] for [employer] on saturday for the first  time since surgery then he will have sunday and monday off.    Pertinent History Back surgery, HTN, OA,    Limitations Walking;House hold activities;Lifting    How long can you sit comfortably? Start-up stiffness    How long can you stand comfortably? 1 hour    How long can you walk comfortably? 30 minutes on treadmill    Diagnostic tests X-ray    Patient Stated Goals Decrease edema and return to work October 1.    Pain Onset More than a month ago                               [facility] Adult PT Treatment/Exercise - 02/27/21 0001       Knee/Hip Exercises: Stretches   Active Hamstring Stretch Left;3 reps;30 seconds    Active Hamstring Stretch Limitations supine with strap    Knee: Self-Stretch Limitations supine heelside AAROM 5 sec X15      Knee/Hip Exercises: Aerobic   Recumbent Bike bike UBE combo: L4 x 8 minutes      Knee/Hip Exercises: Machines for Strengthening   Cybex Knee Extension L LE only:  35# 2x15 reps    Total Gym Leg Press 156# 2X15 L LE only slow eccentrics push into flexion and extension      Knee/Hip Exercises: Standing   Other Standing Knee Exercises TRX squats with 2 sec hold 2X15, TRX lunges X15 bilat    Other Standing Knee Exercises SLS on foam pad with 3 cone tap down and back 5 trips bilat      Manual Therapy   Manual therapy comments Lt knee PROM with overpressure, patella mobs, flexion and extension mobs                       PT Short Term Goals - 01/21/21 0912       PT SHORT TERM GOAL #1   Title Pt will be independent in his initial HEP.    Status Achieved      PT SHORT TERM GOAL #2   Title pt will improve his 5 time sit to stand to </= 14 seconds with no UE support    Status Achieved               PT Long Term Goals - 02/25/21 1043       PT LONG TERM GOAL #1   Title Pt will be independent in his advanced HEP.    Baseline Doing well with gym and HEP.  AROM still needs work.    Status On-going      PT  LONG TERM GOAL #2   Title Pt will be able to amb community surfaces with no device safely for >/= 1000 feet.    Status Achieved      PT LONG TERM GOAL #3   Title Pt will improve his left LE strength to >/= 4+/5 to improve function    Status Achieved      PT LONG TERM GOAL #4   Title Pt will improve his left knee ROM 0-120 degrees actively.    Baseline -3 to 106 degrees on 02/25/2021    Status On-going      PT LONG TERM GOAL #5   Title Pt will be able to navigate 1 flight of steps with single hand rail with pain </= 2/10.    Status Achieved      PT LONG TERM GOAL #6   Title Pt will improve his FOTO to >/= 55.    Status On-going                   Plan - 02/27/21 0932     Clinical Impression Statement He did well with his strength and stretching program for Lt knee without increased pain. We will assess his tolerance to returning to work over the weekend for the first time at his next session.    Personal Factors and Comorbidities Comorbidity 3+    Comorbidities back surgery, HTN, OA    Examination-Activity Limitations Lift;Transfers;Stairs;Stand;Squat    Examination-Participation Restrictions Other;Community Activity    Stability/Clinical Decision Making Stable/Uncomplicated    Rehab Potential Excellent    PT Frequency 2x / week    PT Duration 4 weeks    PT Treatment/Interventions ADLs/Self Care Home Management;Cryotherapy;Electrical Stimulation;Gait training;Stair training;Functional mobility training;Therapeutic activities;Therapeutic exercise;Balance training;Neuromuscular re-education;Patient/family education;Manual techniques;Passive range of motion;Taping;Vasopneumatic Device    PT Next Visit Plan Quadriceps strength, end-range AROM, agility movements    PT Home Exercise Plan Access Code: [email or web address]    Consulted and Agree with Plan of Care Patient  Patient will benefit from skilled therapeutic intervention in order to improve the following deficits  and impairments:  Pain, Difficulty walking, Decreased range of motion, Decreased mobility, Decreased strength, Increased edema, Decreased balance, Impaired flexibility, Decreased activity tolerance  Visit Diagnosis: Difficulty walking  Stiffness of left knee, not elsewhere classified  Acute pain of left knee  Localized edema  Muscle weakness (generalized)     Problem List Patient Active Problem List   Diagnosis Date Noted   Status post laparoscopic appendectomy 01/14/2021   H/O total knee replacement, left 11/11/2020   Leiomyoma 11/08/2020   Unilateral primary osteoarthritis, left knee 10/17/2020   Arthritis 09/26/2018   Onychomycosis 09/26/2018   Class 1 obesity due to excess calories without serious comorbidity with body mass index (BMI) of 32.0 to 32.9 in adult 09/06/2017   Hyperlipidemia LDL goal <100 08/31/2016   OSA on CPAP 08/31/2016   Premature ejaculation 08/19/2011    Debbe Odea, PT,DPT 02/27/2021, 9:44 AM  [facility] [address] [place], [place], [address] Phone: [phone]   Fax:  [phone]  Name: AURTHUR WINGERTER MRN: [id number] Date of Birth: 02-Jul-1965

## 2021-03-04 ENCOUNTER — Encounter: Payer: Self-pay | Admitting: Rehabilitation

## 2021-03-04 ENCOUNTER — Ambulatory Visit: Payer: Federal, State, Local not specified - PPO | Admitting: Rehabilitation

## 2021-03-04 ENCOUNTER — Other Ambulatory Visit: Payer: Self-pay

## 2021-03-04 DIAGNOSIS — M6281 Muscle weakness (generalized): Secondary | ICD-10-CM

## 2021-03-04 DIAGNOSIS — R262 Difficulty in walking, not elsewhere classified: Secondary | ICD-10-CM | POA: Diagnosis not present

## 2021-03-04 DIAGNOSIS — M25662 Stiffness of left knee, not elsewhere classified: Secondary | ICD-10-CM | POA: Diagnosis not present

## 2021-03-04 NOTE — Therapy (Signed)
[facility] [address] [place], [place], [address] Phone: [phone]   Fax:  [phone]  Physical Therapy Treatment  Patient Details  Name: Corey [name] MRN: [id number] Date of Birth: Sep 27, 1965 Referring Provider (PT): Benjiman Core PA-C   Encounter Date: 03/04/2021   PT End of Session - 03/04/21 1054     Visit Number 19    Number of Visits 24    Date for PT Re-Evaluation 03/20/21    Authorization Type BCBS    Authorization Time Period Progress Note sent at 9th visit, PN sent at 14th visit on 02/11/2021    Progress Note Due on Visit 24    PT Start Time 0846    PT Stop Time 0927    PT Time Calculation (min) 41 min    Activity Tolerance Patient tolerated treatment well;No increased pain    Behavior During Therapy WFL for tasks assessed/performed             Past Medical History:  Diagnosis Date   Arthritis    knee, left   ED (erectile dysfunction)    Hyperlipidemia    Hypertension    Sleep apnea    CPAP - not using lately due to recall     Past Surgical History:  Procedure Laterality Date   ANKLE BONE SURGERY     BACK SURGERY     COLLAR BONE SURGEY     COLONOSCOPY     FRACTURE SURGERY     HERNIA REPAIR     KNEE ARTHROSCOPY Left 2006   LAPAROSCOPIC APPENDECTOMY N/A 01/13/2021   Procedure: APPENDECTOMY LAPAROSCOPIC WITH LYSIS OF ADHESIONS;  Surgeon: Michael Boston, MD;  Location: [facility];  Service: General;  Laterality: N/A;   LUMBAR MICRODISCECTOMY     POLYPECTOMY     TOTAL KNEE ARTHROPLASTY Left 11/11/2020   Procedure: LEFT TOTAL KNEE ARTHROPLASTY;  Surgeon: Marybelle Killings, MD;  Location: [place];  Service: Orthopedics;  Laterality: Left;   UMBILICAL HERNIA REPAIR      There were no vitals filed for this visit.   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  Is going to work today and will have to do 3 flights of steps but seated other than  that.    Pertinent History Back surgery, HTN, OA,    Limitations Walking;House hold activities;Lifting    Patient Stated Goals Decrease edema and return to work October 1.    Currently in Pain? No/denies                               [facility] Adult PT Treatment/Exercise - 03/04/21 9563       Neuro Re-ed    Neuro Re-ed Details  Standing on foam airex (LLE) while R tapped heel to floor x 10 reps, alt cone taps x 10 reps on foam, R toe taps to chair seat while LLE on foam x 10 reps then taps to chair with RLE laterally x 10 reps. Forward step ups with LLE in stance on BOSU (blue top up) lifting RLE into march position with light UE support x 2 sets of 10 reps, feet apart on BOSU (black top up) maintaining balance x 30 secs progressing to squats x 3 sets of 10 without UE support.  Standing on foam airex tapping RLE to target in front, at 45 deg laterally then  direct lateral before return to foam x 10 reps without UE support.      Exercises   Exercises Knee/Hip      Knee/Hip Exercises: Aerobic   Recumbent Bike LE bike only x 8 mins at level 4 resistance.  Tolerated well.      Knee/Hip Exercises: Machines for Strengthening   Other Machine shuttle press jumps x 10 reps at 50# then x 2 sets of 15 reps at 75#      Knee/Hip Exercises: Plyometrics   Bilateral Jumping Limitations Performed forward together jumps alt forward apart jumps using agility ladder as guide for jumping x 2 laps    Other Plyometric Exercises Quick forward steps x 2 laps, quick lateral steps x 2 laps using agility ladder    Other Plyometric Exercises Box jumps 4" x 10 reps up and down, progressing to 6" x 10 reps up/down.  Also worked on quick alt jump taps to 4" step x 2 sets of 10 reps.      Knee/Hip Exercises: Standing   Other Standing Knee Exercises walking forward lunges x 30'                       PT Short Term Goals - 01/21/21 0912       PT SHORT TERM GOAL #1   Title Pt will be  independent in his initial HEP.    Status Achieved      PT SHORT TERM GOAL #2   Title pt will improve his 5 time sit to stand to </= 14 seconds with no UE support    Status Achieved               PT Long Term Goals - 02/25/21 1043       PT LONG TERM GOAL #1   Title Pt will be independent in his advanced HEP.    Baseline Doing well with gym and HEP.  AROM still needs work.    Status On-going      PT LONG TERM GOAL #2   Title Pt will be able to amb community surfaces with no device safely for >/= 1000 feet.    Status Achieved      PT LONG TERM GOAL #3   Title Pt will improve his left LE strength to >/= 4+/5 to improve function    Status Achieved      PT LONG TERM GOAL #4   Title Pt will improve his left knee ROM 0-120 degrees actively.    Baseline -3 to 106 degrees on 02/25/2021    Status On-going      PT LONG TERM GOAL #5   Title Pt will be able to navigate 1 flight of steps with single hand rail with pain </= 2/10.    Status Achieved      PT LONG TERM GOAL #6   Title Pt will improve his FOTO to >/= 55.    Status On-going                   Plan - 03/04/21 1055     Clinical Impression Statement Skilled session focused on high level strengthening, plyometrics, and agility exercises as well as high level balance.  Pt tolerated all very well, only had slight increase in pain with forward jumping using agility ladder.  Pt has returned to work with moderate fatigue at end of day and will continue to work today.    Personal Factors and Comorbidities Comorbidity 3+  Comorbidities back surgery, HTN, OA    Examination-Activity Limitations Lift;Transfers;Stairs;Stand;Squat    Examination-Participation Restrictions Other;Community Activity    Stability/Clinical Decision Making Stable/Uncomplicated    Rehab Potential Excellent    PT Frequency 2x / week    PT Duration 4 weeks    PT Treatment/Interventions ADLs/Self Care Home Management;Cryotherapy;Electrical  Stimulation;Gait training;Stair training;Functional mobility training;Therapeutic activities;Therapeutic exercise;Balance training;Neuromuscular re-education;Patient/family education;Manual techniques;Passive range of motion;Taping;Vasopneumatic Device    PT Next Visit Plan Quadriceps strength, end-range AROM, agility movements    PT Home Exercise Plan Access Code: [email or web address]    Consulted and Agree with Plan of Care Patient             Patient will benefit from skilled therapeutic intervention in order to improve the following deficits and impairments:  Pain, Difficulty walking, Decreased range of motion, Decreased mobility, Decreased strength, Increased edema, Decreased balance, Impaired flexibility, Decreased activity tolerance  Visit Diagnosis: Difficulty walking  Stiffness of left knee, not elsewhere classified  Muscle weakness (generalized)     Problem List Patient Active Problem List   Diagnosis Date Noted   Status post laparoscopic appendectomy 01/14/2021   H/O total knee replacement, left 11/11/2020   Leiomyoma 11/08/2020   Unilateral primary osteoarthritis, left knee 10/17/2020   Arthritis 09/26/2018   Onychomycosis 09/26/2018   Class 1 obesity due to excess calories without serious comorbidity with body mass index (BMI) of 32.0 to 32.9 in adult 09/06/2017   Hyperlipidemia LDL goal <100 08/31/2016   OSA on CPAP 08/31/2016   Premature ejaculation 08/19/2011   Cameron Sprang, PT, MPT  03/04/21, 10:57 AM   [facility] Physical Therapy [address] [place], [place], [address] Phone: [phone]   Fax:  [phone]  Name: Corey [name] MRN: [id number] Date of Birth: 30-Jun-1965

## 2021-03-06 ENCOUNTER — Other Ambulatory Visit: Payer: Self-pay

## 2021-03-06 ENCOUNTER — Ambulatory Visit: Payer: Federal, State, Local not specified - PPO | Admitting: Rehabilitative and Restorative Service Providers"

## 2021-03-06 ENCOUNTER — Encounter: Payer: Self-pay | Admitting: Rehabilitative and Restorative Service Providers"

## 2021-03-06 DIAGNOSIS — R262 Difficulty in walking, not elsewhere classified: Secondary | ICD-10-CM | POA: Diagnosis not present

## 2021-03-06 DIAGNOSIS — M6281 Muscle weakness (generalized): Secondary | ICD-10-CM | POA: Diagnosis not present

## 2021-03-06 DIAGNOSIS — R600 Localized edema: Secondary | ICD-10-CM | POA: Diagnosis not present

## 2021-03-06 DIAGNOSIS — M25662 Stiffness of left knee, not elsewhere classified: Secondary | ICD-10-CM | POA: Diagnosis not present

## 2021-03-06 NOTE — Therapy (Signed)
[facility] Physical Therapy [address] [place], [place], [address] Phone: [phone]   Fax:  [phone]  Physical Therapy Treatment  Patient Details  Name: Corey [name] MRN: [id number] Date of Birth: [birth year] Referring Provider (PT): Benjiman Core PA-C   Encounter Date: 03/06/2021   PT End of Session - 03/06/21 0839     Visit Number 20    Number of Visits 24    Date for PT Re-Evaluation 03/20/21    Authorization Type BCBS    Authorization Time Period Progress Note sent at 9th visit, PN sent at 14th visit on 02/11/2021    Progress Note Due on Visit 24    PT Start Time 0804    PT Stop Time 0845    PT Time Calculation (min) 41 min    Activity Tolerance Patient tolerated treatment well;No increased pain    Behavior During Therapy WFL for tasks assessed/performed             Past Medical History:  Diagnosis Date   Arthritis    knee, left   ED (erectile dysfunction)    Hyperlipidemia    Hypertension    Sleep apnea    CPAP - not using lately due to recall     Past Surgical History:  Procedure Laterality Date   ANKLE BONE SURGERY     BACK SURGERY     COLLAR BONE SURGEY     COLONOSCOPY     FRACTURE SURGERY     HERNIA REPAIR     KNEE ARTHROSCOPY Left 2006   LAPAROSCOPIC APPENDECTOMY N/A 01/13/2021   Procedure: APPENDECTOMY LAPAROSCOPIC WITH LYSIS OF ADHESIONS;  Surgeon: Michael Boston, MD;  Location: [facility];  Service: General;  Laterality: N/A;   LUMBAR MICRODISCECTOMY     POLYPECTOMY     TOTAL KNEE ARTHROPLASTY Left 11/11/2020   Procedure: LEFT TOTAL KNEE ARTHROPLASTY;  Surgeon: Marybelle Killings, MD;  Location: [place];  Service: Orthopedics;  Laterality: Left;   UMBILICAL HERNIA REPAIR      There were no vitals filed for this visit.   Subjective Assessment - 03/06/21 0807     Subjective Corey [name] has returned to work and mentioned he was on his feet all day yesterday.  He "definatelty" noticed edema but it is absent this morning.    Pertinent  History Back surgery, HTN, OA,    Limitations Walking;House hold activities;Lifting    How long can you sit comfortably? Start-up stiffness    How long can you stand comfortably? 1 - 1.5 hours    How long can you walk comfortably? Hour    Patient Stated Goals Get back into sports and the gym without restriction.    Currently in Pain? No/denies    Pain Score 0-No pain    Pain Location Knee    Pain Orientation Left    Pain Descriptors / Indicators Tightness    Pain Radiating Towards NA    Pain Onset More than a month ago    Pain Frequency Rarely    Aggravating Factors  Late day fatigue and edema after an 8+ hour shift    Pain Relieving Factors Exercises and ice    Effect of Pain on Daily Activities Not back to full sport function    Multiple Pain Sites No                [facility] PT Assessment - 03/06/21 0001       AROM   Overall AROM Comments Supine    Right/Left  Knee Left    Left Knee Extension -5    Left Knee Flexion 102                           [facility] Adult PT Treatment/Exercise - 03/06/21 0001       Therapeutic Activites    Therapeutic Activities ADL's    ADL's Step up and over 6 and 8 inch step slow eccentrics and lateral tap-downs 10X B off 8 inch step      Neuro Re-ed    Neuro Re-ed Details  Single leg stance 10X 10 seconds each leg and wobble board balance 3X 1 minute      Exercises   Exercises Knee/Hip      Knee/Hip Exercises: Machines for Strengthening   Cybex Knee Extension Up B, down L only slow eccentrics 2 sets of 15 20# and 25#    Other Machine Shuttle press jumps with slow eccentrics and land softly 100# and 50# 10X each, 10X each leg                     PT Education - 03/06/21 0837     Education Details Progressed functional activities, balance and proprioception.  Reviewed emphasis on quadriceps strength and AROM with daily HEP.    Person(s) Educated Patient    Methods Explanation;Demonstration;Verbal cues     Comprehension Verbal cues required;Need further instruction;Returned demonstration;Verbalized understanding              PT Short Term Goals - 01/21/21 0912       PT SHORT TERM GOAL #1   Title Pt will be independent in his initial HEP.    Status Achieved      PT SHORT TERM GOAL #2   Title pt will improve his 5 time sit to stand to </= 14 seconds with no UE support    Status Achieved               PT Long Term Goals - 02/25/21 1043       PT LONG TERM GOAL #1   Title Pt will be independent in his advanced HEP.    Baseline Doing well with gym and HEP.  AROM still needs work.    Status On-going      PT LONG TERM GOAL #2   Title Pt will be able to amb community surfaces with no device safely for >/= 1000 feet.    Status Achieved      PT LONG TERM GOAL #3   Title Pt will improve his left LE strength to >/= 4+/5 to improve function    Status Achieved      PT LONG TERM GOAL #4   Title Pt will improve his left knee ROM 0-120 degrees actively.    Baseline -3 to 106 degrees on 02/25/2021    Status On-going      PT LONG TERM GOAL #5   Title Pt will be able to navigate 1 flight of steps with single hand rail with pain </= 2/10.    Status Achieved      PT LONG TERM GOAL #6   Title Pt will improve his FOTO to >/= 55.    Status On-going                   Plan - 03/06/21 0840     Clinical Impression Statement Corey [name] is doing great with his return to work and normal function.  Increased edema is noted as AROM was slightly more limited.  This is not abnormal given his prolonged standing and walking the past several days.  Consider transfer into independent PT if current progress continues into next week and Corey [name] is independent with his HEP.    Personal Factors and Comorbidities Comorbidity 3+    Comorbidities back surgery, HTN, OA    Examination-Activity Limitations Lift;Transfers;Stairs;Stand;Squat    Examination-Participation Restrictions Other;Community  Activity    Stability/Clinical Decision Making Stable/Uncomplicated    Rehab Potential Excellent    PT Frequency 2x / week    PT Duration 4 weeks    PT Treatment/Interventions ADLs/Self Care Home Management;Cryotherapy;Electrical Stimulation;Gait training;Stair training;Functional mobility training;Therapeutic activities;Therapeutic exercise;Balance training;Neuromuscular re-education;Patient/family education;Manual techniques;Passive range of motion;Taping;Vasopneumatic Device    PT Next Visit Plan Quadriceps strength, end-range AROM, agility movements, dynamic function    PT Home Exercise Plan Access Code: [email or web address]    Consulted and Agree with Plan of Care Patient             Patient will benefit from skilled therapeutic intervention in order to improve the following deficits and impairments:  Pain, Difficulty walking, Decreased range of motion, Decreased mobility, Decreased strength, Increased edema, Decreased balance, Impaired flexibility, Decreased activity tolerance  Visit Diagnosis: Difficulty walking  Localized edema  Stiffness of left knee, not elsewhere classified  Muscle weakness (generalized)     Problem List Patient Active Problem List   Diagnosis Date Noted   Status post laparoscopic appendectomy 01/14/2021   H/O total knee replacement, left 11/11/2020   Leiomyoma 11/08/2020   Unilateral primary osteoarthritis, left knee 10/17/2020   Arthritis 09/26/2018   Onychomycosis 09/26/2018   Class 1 obesity due to excess calories without serious comorbidity with body mass index (BMI) of 32.0 to 32.9 in adult 09/06/2017   Hyperlipidemia LDL goal <100 08/31/2016   OSA on CPAP 08/31/2016   Premature ejaculation 08/19/2011    Farley Ly, PT, MPT 03/06/2021, 9:40 AM  [facility] Physical Therapy [address] [place], [place], [address] Phone: [phone]   Fax:  [phone]  Name: Corey [name] MRN: [id number] Date of Birth:  04/02/1966

## 2021-03-06 NOTE — Patient Instructions (Signed)
Access Code: [email or web address] URL: [email or web address] Date: 03/06/2021 Prepared by: Vista Mink  Exercises Sit to Stand - 3-4 x daily - 7 x weekly - 2 sets - 10 reps Supine Active Straight Leg Raise - 3-4 x daily - 7 x weekly - 2 sets - 10 reps Supine Heel Slide with Strap - 3-4 x daily - 7 x weekly - 2 sets - 10 reps Seated Knee Flexion AAROM - 3-4 x daily - 7 x weekly - 2 sets - 10 reps - 5-10 second hold Prone Knee Extension Hang - 3-4 x daily - 7 x weekly - 3-5 minutes, progressing to 10 minutes hold Supine Quadricep Sets - 2-3 x daily - 7 x weekly - 2-3 sets - 10 reps - 5 second hold

## 2021-03-11 ENCOUNTER — Encounter: Payer: Self-pay | Admitting: Physical Therapy

## 2021-03-11 ENCOUNTER — Ambulatory Visit: Payer: Federal, State, Local not specified - PPO | Admitting: Physical Therapy

## 2021-03-11 ENCOUNTER — Other Ambulatory Visit: Payer: Self-pay

## 2021-03-11 DIAGNOSIS — M25562 Pain in left knee: Secondary | ICD-10-CM

## 2021-03-11 DIAGNOSIS — M25662 Stiffness of left knee, not elsewhere classified: Secondary | ICD-10-CM | POA: Diagnosis not present

## 2021-03-11 DIAGNOSIS — R600 Localized edema: Secondary | ICD-10-CM

## 2021-03-11 DIAGNOSIS — M6281 Muscle weakness (generalized): Secondary | ICD-10-CM

## 2021-03-11 DIAGNOSIS — R262 Difficulty in walking, not elsewhere classified: Secondary | ICD-10-CM | POA: Diagnosis not present

## 2021-03-11 NOTE — Therapy (Signed)
[facility] [address] [place], [place], [address] Phone: [phone]   Fax:  [phone]  Physical Therapy Treatment  Patient Details  Name: Corey [name] MRN: [id number] Date of Birth: Dec 03, 1965 Referring Provider (PT): Benjiman Core PA-C   Encounter Date: 03/11/2021   PT End of Session - 03/11/21 0853     Visit Number 21    Number of Visits 24    Date for PT Re-Evaluation 03/20/21    Authorization Type BCBS    Authorization Time Period Progress Note sent at 9th visit, PN sent at 14th visit on 02/11/2021    Progress Note Due on Visit 24    PT Start Time 0846    PT Stop Time 0925    PT Time Calculation (min) 39 min    Activity Tolerance Patient tolerated treatment well;No increased pain    Behavior During Therapy WFL for tasks assessed/performed             Past Medical History:  Diagnosis Date   Arthritis    knee, left   ED (erectile dysfunction)    Hyperlipidemia    Hypertension    Sleep apnea    CPAP - not using lately due to recall     Past Surgical History:  Procedure Laterality Date   ANKLE BONE SURGERY     BACK SURGERY     COLLAR BONE SURGEY     COLONOSCOPY     FRACTURE SURGERY     HERNIA REPAIR     KNEE ARTHROSCOPY Left 2006   LAPAROSCOPIC APPENDECTOMY N/A 01/13/2021   Procedure: APPENDECTOMY LAPAROSCOPIC WITH LYSIS OF ADHESIONS;  Surgeon: Michael Boston, MD;  Location: [facility];  Service: General;  Laterality: N/A;   LUMBAR MICRODISCECTOMY     POLYPECTOMY     TOTAL KNEE ARTHROPLASTY Left 11/11/2020   Procedure: LEFT TOTAL KNEE ARTHROPLASTY;  Surgeon: Marybelle Killings, MD;  Location: [place];  Service: Orthopedics;  Laterality: Left;   UMBILICAL HERNIA REPAIR      There were no vitals filed for this visit.   Subjective Assessment - 03/11/21 0851     Subjective Pt reporting more stiffness this morning after working 10 hour shift yesterday.    Pertinent History Back surgery, HTN, OA,    Limitations Walking;House  hold activities;Lifting    How long can you sit comfortably? Start-up stiffness    Patient Stated Goals Get back into sports and the gym without restriction.    Currently in Pain? No/denies    Pain Descriptors / Indicators --   just  stiffness noted               [facility] PT Assessment - 03/11/21 0001       AROM   Left Knee Extension -5    Left Knee Flexion 106      PROM   Left Knee Extension -4    Left Knee Flexion 112                           [facility] Adult PT Treatment/Exercise - 03/11/21 0001       Neuro Re-ed    Neuro Re-ed Details  bilateral LE's BOSU ball dome up, dome down, SLS on Bosu ball, squats on BOSU ball dome up all with intermittent UE support in parallel bars      Exercises   Exercises Knee/Hip      Knee/Hip Exercises: Stretches   Other Knee/Hip Stretches child pose  x 2 holding 30 seconds      Knee/Hip Exercises: Aerobic   Recumbent Bike Le/UBE L5 x 8 minutes      Knee/Hip Exercises: Machines for Strengthening   Total Gym Leg Press 162# bilateral LE 3x10, Left LE only: 75# 3x10      Manual Therapy   Manual therapy comments contract relax x10, PROM knee flexion and extension with overpressures, knee flexion with IR seated at edge of mat table                       PT Short Term Goals - 01/21/21 0912       PT SHORT TERM GOAL #1   Title Pt will be independent in his initial HEP.    Status Achieved      PT SHORT TERM GOAL #2   Title pt will improve his 5 time sit to stand to </= 14 seconds with no UE support    Status Achieved               PT Long Term Goals - 03/11/21 0910       PT LONG TERM GOAL #1   Title Pt will be independent in his advanced HEP.    Status On-going      PT LONG TERM GOAL #2   Title Pt will be able to amb community surfaces with no device safely for >/= 1000 feet.    Status Achieved      PT LONG TERM GOAL #3   Title Pt will improve his left LE strength to >/= 4+/5 to improve  function    Status Achieved      PT LONG TERM GOAL #4   Title Pt will improve his left knee ROM 0-120 degrees actively.    Status On-going      PT LONG TERM GOAL #5   Title Pt will be able to navigate 1 flight of steps with single hand rail with pain </= 2/10.    Status Achieved      PT LONG TERM GOAL #6   Title Pt will improve his FOTO to >/= 55.    Status Achieved                   Plan - 03/11/21 0905     Clinical Impression Statement Pt is still struggling with AROM and PROM in his left knee. We discussed contiued PT for 3 more visits and then discharge with concentration on quad strengthening with manual skilled interventions. Pt with good response to contract relax techniques and passive range with over pressures. Pt is continuing to work at home and in the gym with his HEP.    Personal Factors and Comorbidities Comorbidity 3+    Comorbidities back surgery, HTN, OA    Examination-Activity Limitations Lift;Transfers;Stairs;Stand;Squat    Examination-Participation Restrictions Other;Community Activity    Stability/Clinical Decision Making Stable/Uncomplicated    Rehab Potential Excellent    PT Frequency 2x / week    PT Duration 4 weeks    PT Treatment/Interventions ADLs/Self Care Home Management;Cryotherapy;Electrical Stimulation;Gait training;Stair training;Functional mobility training;Therapeutic activities;Therapeutic exercise;Balance training;Neuromuscular re-education;Patient/family education;Manual techniques;Passive range of motion;Taping;Vasopneumatic Device    PT Next Visit Plan manual interventions to help improve ROM before discharge. dyanmic balance, quad strengthening    PT Home Exercise Plan Access Code: [email or web address]    Consulted and Agree with Plan of Care Patient             Patient will benefit  from skilled therapeutic intervention in order to improve the following deficits and impairments:  Pain, Difficulty walking, Decreased range of motion,  Decreased mobility, Decreased strength, Increased edema, Decreased balance, Impaired flexibility, Decreased activity tolerance  Visit Diagnosis: Difficulty walking  Localized edema  Stiffness of left knee, not elsewhere classified  Muscle weakness (generalized)  Acute pain of left knee     Problem List Patient Active Problem List   Diagnosis Date Noted   Status post laparoscopic appendectomy 01/14/2021   H/O total knee replacement, left 11/11/2020   Leiomyoma 11/08/2020   Unilateral primary osteoarthritis, left knee 10/17/2020   Arthritis 09/26/2018   Onychomycosis 09/26/2018   Class 1 obesity due to excess calories without serious comorbidity with body mass index (BMI) of 32.0 to 32.9 in adult 09/06/2017   Hyperlipidemia LDL goal <100 08/31/2016   OSA on CPAP 08/31/2016   Premature ejaculation 08/19/2011    Oretha Caprice, PT, MPT 03/11/2021, 9:34 AM  [facility] Physical Therapy [address] [place], [place], [address] Phone: [phone]   Fax:  [phone]  Name: Corey [name] MRN: [id number] Date of Birth: [birth year]

## 2021-03-13 ENCOUNTER — Ambulatory Visit: Payer: Federal, State, Local not specified - PPO | Admitting: Rehabilitative and Restorative Service Providers"

## 2021-03-13 ENCOUNTER — Encounter: Payer: Self-pay | Admitting: Rehabilitative and Restorative Service Providers"

## 2021-03-13 ENCOUNTER — Other Ambulatory Visit: Payer: Self-pay

## 2021-03-13 DIAGNOSIS — M25562 Pain in left knee: Secondary | ICD-10-CM

## 2021-03-13 DIAGNOSIS — R262 Difficulty in walking, not elsewhere classified: Secondary | ICD-10-CM

## 2021-03-13 DIAGNOSIS — M25662 Stiffness of left knee, not elsewhere classified: Secondary | ICD-10-CM

## 2021-03-13 DIAGNOSIS — R600 Localized edema: Secondary | ICD-10-CM | POA: Diagnosis not present

## 2021-03-13 DIAGNOSIS — M6281 Muscle weakness (generalized): Secondary | ICD-10-CM

## 2021-03-13 NOTE — Therapy (Signed)
[facility] Physical Therapy [address] [place], [place], [address] Phone: [phone]   Fax:  [phone]  Physical Therapy Treatment/Discharge  Patient Details  Name: Corey [name] MRN: [id number] Date of Birth: 15-Jan-1966 Referring Provider (PT): Benjiman Core PA-C  PHYSICAL THERAPY DISCHARGE SUMMARY  Visits from Start of Care: 22  Current functional level related to goals / functional outcomes: See note (great)   Remaining deficits: See note (AROM, strength, edema addressed with HEP)   Education / Equipment: Updated home and gym program addressing remaining impairments   Patient agrees to discharge. Patient goals were met. Patient is being discharged due to meeting the stated rehab goals.  Encounter Date: 03/13/2021   PT End of Session - 03/13/21 1443     Visit Number 22    Number of Visits 24    Date for PT Re-Evaluation 03/20/21    Authorization Type BCBS    Authorization Time Period Progress Note sent at 9th visit, PN sent at 14th visit on 02/11/2021    Progress Note Due on Visit 24    PT Start Time 0846    PT Stop Time 0930    PT Time Calculation (min) 44 min    Activity Tolerance Patient tolerated treatment well;No increased pain    Behavior During Therapy WFL for tasks assessed/performed             Past Medical History:  Diagnosis Date   Arthritis    knee, left   ED (erectile dysfunction)    Hyperlipidemia    Hypertension    Sleep apnea    CPAP - not using lately due to recall     Past Surgical History:  Procedure Laterality Date   ANKLE BONE SURGERY     BACK SURGERY     COLLAR BONE SURGEY     COLONOSCOPY     FRACTURE SURGERY     HERNIA REPAIR     KNEE ARTHROSCOPY Left 2006   LAPAROSCOPIC APPENDECTOMY N/A 01/13/2021   Procedure: APPENDECTOMY LAPAROSCOPIC WITH LYSIS OF ADHESIONS;  Surgeon: Michael Boston, MD;  Location: [facility];  Service: General;  Laterality: N/A;   LUMBAR MICRODISCECTOMY     POLYPECTOMY     TOTAL  KNEE ARTHROPLASTY Left 11/11/2020   Procedure: LEFT TOTAL KNEE ARTHROPLASTY;  Surgeon: Marybelle Killings, MD;  Location: [facility];  Service: Orthopedics;  Laterality: Left;   UMBILICAL HERNIA REPAIR      There were no vitals filed for this visit.   Subjective Assessment - 03/13/21 0851     Subjective Corey [name] is doing his normal gym and work activities.  He does need breaks to ice and rest but rates himself "about 60% of normal."    Pertinent History Back surgery, HTN, OA,    Limitations Walking;House hold activities;Lifting    How long can you sit comfortably? Start-up stiffness    How long can you stand comfortably? Works 8-10 hours with a break about every 2 hours    How long can you walk comfortably? Same as above    Patient Stated Goals Get back into sports and the gym without restriction.    Currently in Pain? No/denies    Pain Score 0-No pain    Pain Location Knee    Pain Orientation Left    Pain Descriptors / Indicators Tightness    Pain Type Surgical pain    Pain Radiating Towards NA    Pain Onset More than a month ago    Pain Frequency Rarely  Aggravating Factors  Fatigue and edema after a 8-10 hour work shift    Pain Relieving Factors Exercises, ice, ibuprofen PRN    Effect of Pain on Daily Activities Not back to full function    Multiple Pain Sites No                [facility] PT Assessment - 03/13/21 0001       AROM   Overall AROM Comments Supine    Right/Left Knee Left    Left Knee Extension -4    Left Knee Flexion 102                           [facility] Adult PT Treatment/Exercise - 03/13/21 0001       Therapeutic Activites    Therapeutic Activities ADL's    ADL's Step-up and over 6 and 8 inch step      Neuro Re-ed    Neuro Re-ed Details  Single-leg stance 10X 10 seconds B      Exercises   Exercises Knee/Hip      Knee/Hip Exercises: Aerobic   Recumbent Bike Seat 8 for 8 minutes      Knee/Hip Exercises: Machines for Strengthening   Total  Gym Leg Press 162# 20X B slow eccentrics, push into extension and stretch into flexion and 75# L leg only 15X      Knee/Hip Exercises: Prone   Prone Knee Hang 3 minutes    Prone Knee Hang Weights (lbs) 10#                     PT Education - 03/13/21 0909     Education Details Reviewed and weeded down HEP.  Minimal correction required.    Person(s) Educated Patient    Methods Explanation;Verbal cues;Handout    Comprehension Verbalized understanding;Returned demonstration;Verbal cues required              PT Short Term Goals - 01/21/21 0912       PT SHORT TERM GOAL #1   Title Pt will be independent in his initial HEP.    Status Achieved      PT SHORT TERM GOAL #2   Title pt will improve his 5 time sit to stand to </= 14 seconds with no UE support    Status Achieved               PT Long Term Goals - 03/13/21 1442       PT LONG TERM GOAL #1   Title Pt will be independent in his advanced HEP.    Status Achieved      PT LONG TERM GOAL #2   Title Pt will be able to amb community surfaces with no device safely for >/= 1000 feet.    Status Achieved      PT LONG TERM GOAL #3   Title Pt will improve his left LE strength to >/= 4+/5 to improve function    Status Achieved      PT LONG TERM GOAL #4   Title Pt will improve his left knee ROM 0-120 degrees actively.    Baseline -4 to 102 degrees (has been as high as 106 when less edema is present)    Status Not Met      PT LONG TERM GOAL #5   Title Pt will be able to navigate 1 flight of steps with single hand rail with pain </= 2/10.  Status Achieved      PT LONG TERM GOAL #6   Title Pt will improve his FOTO to >/= 55.    Baseline 65 (was 33)    Status Achieved                   Plan - 03/13/21 1445     Clinical Impression Statement Corey [name] has returned to work doing 8-10 hour shifts with rest and ice breaks PRN.  He has excellent compliance with his gym and HEP which continues to address  AROM, strength and functional impairments.  I am confident Corey [name] will continue to improve given his excellent work ethic and compliance with his up to date HEP.  I expect long-term AROM in the -3 to 105 degree range as strength continues to improve.  He is welcome to return to supervised PT if progress with his independent PT is not as expected.    Personal Factors and Comorbidities Comorbidity 3+    Comorbidities back surgery, HTN, OA    Examination-Activity Limitations Lift;Transfers;Stairs;Stand;Squat    Examination-Participation Restrictions Other;Community Activity    Stability/Clinical Decision Making Stable/Uncomplicated    Rehab Potential Excellent    PT Frequency Other (comment)    PT Duration Other (comment)   DC   PT Treatment/Interventions ADLs/Self Care Home Management;Cryotherapy;Electrical Stimulation;Gait training;Stair training;Functional mobility training;Therapeutic activities;Therapeutic exercise;Balance training;Neuromuscular re-education;Patient/family education;Manual techniques;Passive range of motion;Taping;Vasopneumatic Device    PT Next Visit Plan DC    PT Home Exercise Plan Access Code: [email or web address]    Consulted and Agree with Plan of Care Patient             Patient will benefit from skilled therapeutic intervention in order to improve the following deficits and impairments:  Pain, Difficulty walking, Decreased range of motion, Decreased mobility, Decreased strength, Increased edema, Decreased balance, Impaired flexibility, Decreased activity tolerance  Visit Diagnosis: Difficulty walking  Localized edema  Stiffness of left knee, not elsewhere classified  Muscle weakness (generalized)  Acute pain of left knee     Problem List Patient Active Problem List   Diagnosis Date Noted   Status post laparoscopic appendectomy 01/14/2021   H/O total knee replacement, left 11/11/2020   Leiomyoma 11/08/2020   Unilateral primary osteoarthritis, left knee  10/17/2020   Arthritis 09/26/2018   Onychomycosis 09/26/2018   Class 1 obesity due to excess calories without serious comorbidity with body mass index (BMI) of 32.0 to 32.9 in adult 09/06/2017   Hyperlipidemia LDL goal <100 08/31/2016   OSA on CPAP 08/31/2016   Premature ejaculation 08/19/2011    Farley Ly, PT, MPT 03/13/2021, 2:52 PM  [facility] Physical Therapy [address] [place], [place], [address] Phone: [phone]   Fax:  [phone]  Name: Corey [name] MRN: [id number] Date of Birth: 12/17/1965

## 2021-03-13 NOTE — Patient Instructions (Signed)
Access Code: [email or web address] URL: [email or web address] Date: 03/13/2021 Prepared by: Vista Mink  Exercises Sit to Stand - 1 x daily - 7 x weekly - 1-2 sets - 10 reps Supine Heel Slide with Strap - 2 x daily - 7 x weekly - 1 sets - 10 reps Seated Knee Flexion AAROM - 2 x daily - 7 x weekly - 1 sets - 10 reps - 10 second hold Prone Knee Extension Hang - 2 x daily - 7 x weekly - 3-5 minutes, progressing to 10 minutes hold Supine Quadricep Sets - 2-3 x daily - 7 x weekly - 2-3 sets - 10 reps - 5 second hold Alternating Single Leg Balance - Foot in Front - 2 x daily - 7 x weekly - 1 sets - 10 reps - 10 seconds hold

## 2021-03-18 ENCOUNTER — Encounter: Payer: Federal, State, Local not specified - PPO | Admitting: Physical Therapy

## 2021-03-20 ENCOUNTER — Encounter: Payer: Federal, State, Local not specified - PPO | Admitting: Rehabilitative and Restorative Service Providers"

## 2021-04-17 DIAGNOSIS — G4733 Obstructive sleep apnea (adult) (pediatric): Secondary | ICD-10-CM | POA: Diagnosis not present

## 2021-04-21 ENCOUNTER — Other Ambulatory Visit: Payer: Self-pay

## 2021-04-21 ENCOUNTER — Ambulatory Visit: Payer: Federal, State, Local not specified - PPO | Admitting: Family Medicine

## 2021-04-21 ENCOUNTER — Encounter: Payer: Self-pay | Admitting: Family Medicine

## 2021-04-21 VITALS — BP 150/84 | HR 84 | Temp 98.8°F | Wt 232.8 lb

## 2021-04-21 DIAGNOSIS — R7302 Impaired glucose tolerance (oral): Secondary | ICD-10-CM

## 2021-04-21 DIAGNOSIS — Z23 Encounter for immunization: Secondary | ICD-10-CM

## 2021-04-21 DIAGNOSIS — E785 Hyperlipidemia, unspecified: Secondary | ICD-10-CM

## 2021-04-21 LAB — POCT GLYCOSYLATED HEMOGLOBIN (HGB A1C): Hemoglobin A1C: 6.1 % — AB (ref 4.0–5.6)

## 2021-04-21 NOTE — Progress Notes (Signed)
   Subjective:    Patient ID: Corey [name], male    DOB: 11-Jul-1965, 55 y.o.   MRN: [id number]  HPI He is here for a recheck.  He has been trying to make some dietary changes to get his lipids down to a more reasonable range.  His last panel did show an LDL above 200.  He also has a history of glucose intolerance.  Review of Systems     Objective:   Physical Exam Alert and in no distress otherwise not examined.  Hemoglobin A1c is 6.1.       Assessment & Plan:  Hyperlipidemia LDL goal <100 - Plan: Lipid panel  Need for COVID-19 vaccine - Plan: Moderna Covid-19 Vaccine Bivalent Booster  Glucose intolerance (impaired glucose tolerance) - Plan: POCT glycosylated hemoglobin (Hb A1C) Encouraged him to continue with diet and exercise specifically cutting back on carbohydrates.  We will also refer him for a flu shot to [facility]

## 2021-04-24 LAB — LIPID PANEL
Chol/HDL Ratio: 5.6 ratio — ABNORMAL HIGH (ref 0.0–5.0)
Cholesterol, Total: 220 mg/dL — ABNORMAL HIGH (ref 100–199)
HDL: 39 mg/dL — ABNORMAL LOW (ref >39)
LDL Chol Calc (NIH): 155 mg/dL — ABNORMAL HIGH (ref 0–99)
Triglycerides: 144 mg/dL (ref 0–149)
VLDL Cholesterol Cal: 26 mg/dL (ref 5–40)

## 2021-05-12 ENCOUNTER — Telehealth: Payer: Self-pay

## 2021-05-12 NOTE — Telephone Encounter (Signed)
Note faxed.

## 2021-05-12 NOTE — Telephone Encounter (Signed)
Carla with Dr. Alric Ran dental office would like pre-medication note/letter faxed to [phone].  Cb# [phone].  Please advise.  Thank you.

## 2021-05-14 ENCOUNTER — Ambulatory Visit: Payer: Federal, State, Local not specified - PPO | Admitting: Orthopaedic Surgery

## 2021-05-17 DIAGNOSIS — G4733 Obstructive sleep apnea (adult) (pediatric): Secondary | ICD-10-CM | POA: Diagnosis not present

## 2021-05-21 ENCOUNTER — Encounter: Payer: Self-pay | Admitting: Orthopaedic Surgery

## 2021-05-21 ENCOUNTER — Ambulatory Visit: Payer: Federal, State, Local not specified - PPO | Admitting: Orthopaedic Surgery

## 2021-05-21 ENCOUNTER — Other Ambulatory Visit: Payer: Self-pay

## 2021-05-21 VITALS — BP 148/96 | HR 91 | Ht 69.0 in | Wt 230.0 lb

## 2021-05-21 DIAGNOSIS — Z96652 Presence of left artificial knee joint: Secondary | ICD-10-CM

## 2021-05-21 NOTE — Progress Notes (Signed)
Office Visit Note   Patient: Corey [name]           Date of Birth: 07-15-1965           MRN: [id number] Visit Date: 05/21/2021              Requested by: Denita Lung, MD [place],  [place] [address] PCP: Denita Lung, MD   Assessment & Plan: Visit Diagnoses:  1. H/O total knee replacement, left     Plan: We discussed exercises he can work on to get a little bit more flexion as well as continue working on extension.  He is happy with results of surgery and we discussed improvement if he gets a little bit more range of motion of his knee.  His strength is excellent.  He can follow-up on an as-needed basis.  Follow-Up Instructions: Return if symptoms worsen or fail to improve.   Orders:  No orders of the defined types were placed in this encounter.  No orders of the defined types were placed in this encounter.     Procedures: No procedures performed   Clinical Data: No additional findings.   Subjective: Chief Complaint  Patient presents with   Left Knee - Follow-up    11/11/2020 Left TKA    HPI 55 year old male follow-up left total knee arthroplasty now 6 months out.  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  Last range of motion is 4 degrees to 104 degrees.  No problems with his opposite knee.  Review of Systems updated unchanged.   Objective: Vital Signs: BP (!) 148/96    Pulse 91    Ht 5\' 9"  (1.753 m)    Wt 230 lb (104.3 kg)    BMI 33.97 kg/m   Physical Exam Constitutional:      Appearance: He is well-developed.  HENT:     Head: Normocephalic and atraumatic.     Right Ear: External ear normal.     Left Ear: External ear normal.  Eyes:     Pupils: Pupils are equal, round, and reactive to light.  Neck:     Thyroid: No thyromegaly.     Trachea: No tracheal deviation.  Cardiovascular:     Rate and Rhythm: Normal rate.  Pulmonary:     Effort: Pulmonary  effort is normal.     Breath sounds: No wheezing.  Abdominal:     General: Bowel sounds are normal.     Palpations: Abdomen is soft.  Musculoskeletal:     Cervical back: Neck supple.  Skin:    General: Skin is warm and dry.     Capillary Refill: Capillary refill takes less than 2 seconds.  Neurological:     Mental Status: He is alert and oriented to person, place, and time.  Psychiatric:        Behavior: Behavior normal.        Thought Content: Thought content normal.        Judgment: Judgment normal.    Ortho Exam patient lacks 3 to 4 degrees reaching full extension.  He is flexing to 100 degrees.  He gets from sitting standing easily ambulates without limp excellent quad strength in sitting position with resistive testing.  Specialty Comments:  No specialty comments available.  Imaging: No results found.   PMFS History: Patient Active Problem List   Diagnosis Date Noted   Status post  laparoscopic appendectomy 01/14/2021   H/O total knee replacement, left 11/11/2020   Leiomyoma 11/08/2020   Arthritis 09/26/2018   Onychomycosis 09/26/2018   Class 1 obesity due to excess calories without serious comorbidity with body mass index (BMI) of 32.0 to 32.9 in adult 09/06/2017   Hyperlipidemia LDL goal <100 08/31/2016   OSA on CPAP 08/31/2016   Premature ejaculation 08/19/2011   Past Medical History:  Diagnosis Date   Arthritis    knee, left   ED (erectile dysfunction)    Hyperlipidemia    Hypertension    Sleep apnea    CPAP - not using lately due to recall     Family History  Problem Relation Age of Onset   Alzheimer's disease Mother    Stevens-Johnson syndrome Father    Autism Brother    Colon cancer Other    Colon polyps Neg Hx    Esophageal cancer Neg Hx    Rectal cancer Neg Hx    Stomach cancer Neg Hx     Past Surgical History:  Procedure Laterality Date   ANKLE BONE SURGERY     BACK SURGERY     COLLAR BONE SURGEY     COLONOSCOPY     FRACTURE SURGERY      HERNIA REPAIR     KNEE ARTHROSCOPY Left 2006   LAPAROSCOPIC APPENDECTOMY N/A 01/13/2021   Procedure: APPENDECTOMY LAPAROSCOPIC WITH LYSIS OF ADHESIONS;  Surgeon: Michael Boston, MD;  Location: [facility];  Service: General;  Laterality: N/A;   LUMBAR MICRODISCECTOMY     POLYPECTOMY     TOTAL KNEE ARTHROPLASTY Left 11/11/2020   Procedure: LEFT TOTAL KNEE ARTHROPLASTY;  Surgeon: Marybelle Killings, MD;  Location: [facility];  Service: Orthopedics;  Laterality: Left;   UMBILICAL HERNIA REPAIR     Social History   Occupational History   Occupation: Engineer, agricultural  Tobacco Use   Smoking status: Never   Smokeless tobacco: Never  Vaping Use   Vaping Use: Never used  Substance and Sexual Activity   Alcohol use: Yes    Alcohol/week: 3.0 standard drinks    Types: 3 Glasses of wine per week    Comment: occasional   Drug use: Not Currently    Types: Marijuana   Sexual activity: Yes

## 2021-06-17 DIAGNOSIS — G4733 Obstructive sleep apnea (adult) (pediatric): Secondary | ICD-10-CM | POA: Diagnosis not present

## 2021-07-16 DIAGNOSIS — G4733 Obstructive sleep apnea (adult) (pediatric): Secondary | ICD-10-CM | POA: Diagnosis not present

## 2021-07-18 DIAGNOSIS — G4733 Obstructive sleep apnea (adult) (pediatric): Secondary | ICD-10-CM | POA: Diagnosis not present

## 2021-08-04 ENCOUNTER — Encounter (HOSPITAL_COMMUNITY): Payer: Self-pay

## 2021-08-04 ENCOUNTER — Other Ambulatory Visit: Payer: Self-pay

## 2021-08-04 ENCOUNTER — Ambulatory Visit (HOSPITAL_COMMUNITY)
Admission: EM | Admit: 2021-08-04 | Discharge: 2021-08-04 | Disposition: A | Discharge status: Home / Self Care | Payer: Federal, State, Local not specified - PPO | Attending: Sports Medicine | Admitting: Sports Medicine

## 2021-08-04 DIAGNOSIS — M25522 Pain in left elbow: Secondary | ICD-10-CM | POA: Diagnosis not present

## 2021-08-04 DIAGNOSIS — R2232 Localized swelling, mass and lump, left upper limb: Secondary | ICD-10-CM

## 2021-08-04 DIAGNOSIS — S39012A Strain of muscle, fascia and tendon of lower back, initial encounter: Secondary | ICD-10-CM

## 2021-08-04 MED ORDER — KETOROLAC TROMETHAMINE 60 MG/2ML IM SOLN
INTRAMUSCULAR | Status: AC
  Filled 2021-08-04: qty 2

## 2021-08-04 MED ORDER — KETOROLAC TROMETHAMINE 60 MG/2ML IM SOLN
60.0000 mg | Freq: Once | INTRAMUSCULAR | Status: AC
  Administered 2021-08-04: 60 mg via INTRAMUSCULAR

## 2021-08-04 NOTE — ED Provider Notes (Signed)
[facility]    CSN: [id number] Arrival date & time: 08/04/21  6553      History   Chief Complaint Chief Complaint  Patient presents with   Back Pain   Elbow Pain    HPI Corey [name] is a 56 y.o. male here for low back strain and bump on left elbow.   Back Pain Associated symptoms: no fever    Hurt back a few days ago Doing ab machine and exercises at gym, no pain or pop with activity, but awoke the next morning with stiffness and pain. Low left-sided back. No LE radiation, no numbness/tingling. No LE weakness. No bowel or bladder incontinence.  The pain does wrap around to the posterior left buttock. Pain in low back and buttock. Initially had antalgic gait, but this and his pain is improving.  He denies any bowel or bladder incontinence.  He has taken ibuprofen once a day, which is somewhat helpful but still some tightness.  His last dose of ibuprofen was yesterday around 4-5 PM.  He does not like taking oral medications. He does have a history of a lumbar microdiscectomy and TLIF by Dr. Lorin Mercy back in around 1998 per the patient.  Left elbow bump -patient reports a firm nodule over the olecranon area of the left elbow.  He states he had this a year or so ago, and had this removed by his primary doctor, Dr. Redmond School, and per the patient had this biopsied which came back as benign lesion.  Noticed this pop back up on the elbow, it is slightly tender with pressure, denies any redness erythema or drainage.  It is approximately the size of a small pebble.  He has full range of motion of the elbow, denies any weakness.  Past Medical History:  Diagnosis Date   Arthritis    knee, left   ED (erectile dysfunction)    Hyperlipidemia    Hypertension    Sleep apnea    CPAP - not using lately due to recall     Patient Active Problem List   Diagnosis Date Noted   Status post laparoscopic appendectomy 01/14/2021   H/O total knee replacement, left 11/11/2020   Leiomyoma  11/08/2020   Arthritis 09/26/2018   Onychomycosis 09/26/2018   Class 1 obesity due to excess calories without serious comorbidity with body mass index (BMI) of 32.0 to 32.9 in adult 09/06/2017   Hyperlipidemia LDL goal <100 08/31/2016   OSA on CPAP 08/31/2016   Premature ejaculation 08/19/2011    Past Surgical History:  Procedure Laterality Date   ANKLE BONE SURGERY     BACK SURGERY     COLLAR BONE SURGEY     COLONOSCOPY     FRACTURE SURGERY     HERNIA REPAIR     KNEE ARTHROSCOPY Left 2006   LAPAROSCOPIC APPENDECTOMY N/A 01/13/2021   Procedure: APPENDECTOMY LAPAROSCOPIC WITH LYSIS OF ADHESIONS;  Surgeon: Michael Boston, MD;  Location[facility];  Service: General;  Laterality: N/A;   LUMBAR MICRODISCECTOMY     POLYPECTOMY     TOTAL KNEE ARTHROPLASTY Left 11/11/2020   Procedure: LEFT TOTAL KNEE ARTHROPLASTY;  Surgeon: Marybelle Killings, MD;  Location: [facility];  Service: Orthopedics;  Laterality: Left;   UMBILICAL HERNIA REPAIR         Home Medications    Prior to Admission medications   Medication Sig Start Date End Date Taking? Authorizing Provider  Multiple Vitamins-Minerals (MULTIVITAMIN WITH MINERALS) tablet Take 2 tablets by mouth daily.  [provider]  PARoxetine (PAXIL) 20 MG tablet Take 1 tablet (20 mg total) by mouth daily. TAKE 1 TABLET BY MOUTH EVERY DAY IN THE MORNING 11/12/20   Lanae Crumbly, PA-C    Family History Family History  Problem Relation Age of Onset   Alzheimer's disease Mother    Stevens-Johnson syndrome Father    Autism Brother    Colon cancer Other    Colon polyps Neg Hx    Esophageal cancer Neg Hx    Rectal cancer Neg Hx    Stomach cancer Neg Hx     Social History Social History   Tobacco Use   Smoking status: Never   Smokeless tobacco: Never  Vaping Use   Vaping Use: Never used  Substance Use Topics   Alcohol use: Yes    Alcohol/week: 3.0 standard drinks    Types: 3 Glasses of wine per week    Comment: occasional   Drug use:  Not Currently    Types: Marijuana     Allergies   Penicillins   Review of Systems Review of Systems  Constitutional:  Negative for chills and fever.  Genitourinary:  Negative for difficulty urinating.  Musculoskeletal:  Positive for arthralgias (left elbow) and back pain.  Skin:  Negative for color change and rash.    Physical Exam Triage Vital Signs ED Triage Vitals  Enc Vitals Group     BP 08/04/21 0915 (!) 160/101     Pulse Rate 08/04/21 0915 79     Resp 08/04/21 0915 16     Temp 08/04/21 0915 98.5 F (36.9 C)     Temp Source 08/04/21 0915 Oral     SpO2 08/04/21 0915 98 %     Weight --      Height --      Head Circumference --      Peak Flow --      Pain Score 08/04/21 0914 2     Pain Loc --      Pain Edu? --      Excl. in [place]? --    No data found.  Updated Vital Signs BP (!) 160/101 (BP Location: Left Arm)    Pulse 79    Temp 98.5 F (36.9 C) (Oral)    Resp 16    SpO2 98%    Physical Exam Gen: Well-appearing, in no acute distress; non-toxic CV: Regular Rate. Well-perfused. Warm.  Resp: Breathing unlabored on room air; no wheezing. Psych: Fluid speech in conversation; appropriate affect; normal thought process Neuro: Sensation intact throughout. No gross coordination deficits.  MSK:   - Left elbow: There is a small, approximately 0.5 x 0.5 superficial nodule over the olecranon fossa area.  This is slightly tender to palpation.  There is no surrounding erythema, ecchymosis or swelling.  There is no effusion of the olecranon bursa.  There is full range of motion about the elbow.  5/5 strength of the left upper extremity.  Gross sensation intact.  - Lumbar spine: Inspection yields no gross deformity or notable scoliosis.  There is previous incision of the lumbar spine, well-healed, likely from prior TLIF.  No erythema or ecchymosis.  There is no midline spinous process TTP.  There is some hypertonicity and positive TTP of the left paraspinal muscles and left  gluteus maximus muscles.  No greater trochanter TTP.  There is full range of motion of the lumbar spine in flexion and extension without notable pain.  5/5 strength of the bilateral lower extremities in  the L4-S1 nerve root distribution.  Negative straight leg raise.  No pain with hip abduction.  Sensation to light touch intact.   UC Treatments / Results  Labs (all labs ordered are listed, but only abnormal results are displayed) Labs Reviewed - No data to display  EKG   Radiology No results found.  Procedures Procedures (including critical care time)  Medications Ordered in UC Medications  ketorolac (TORADOL) injection 60 mg (60 mg Intramuscular Given 08/04/21 1017)    Initial Impression / Assessment and Plan / UC Course  I have reviewed the triage vital signs and the nursing notes.  Pertinent labs & imaging results that were available during my care of the patient were reviewed by me and considered in my medical decision making (see chart for details).     Patient presents with strain of the lumbar spine of the left low back.  Midline spinous process TTP, no red flag symptoms.  He also presents with a abnormal skin nodule over the olecranon area in which she has had before and had excised by his PCP.  This is not concerning for infection nor effusion of the olecranon bursa.  For the back, we did provide a Toradol injection for the patient.  Avoid NSAIDs today, but may use these as needed starting tomorrow.  Recommended heat and rehab exercises/stretching.  Avoid pressure and contact of the elbow nodule, he is okay with following back up with his PCP to determine next steps.  He may also follow-up with him for his high blood pressure, may be elevated secondary to pain.  He is safe for discharge home.  Final Clinical Impressions(s) / UC Diagnoses   Final diagnoses:  Strain of lumbar region, initial encounter  Left elbow pain  Nodule of skin of left upper extremity     Discharge  Instructions      - We provided a Toradol injection for you today, avoid using ibuprofen today, you may start tomorrow if you wish -Recommend heat to the low back -Begin range of motion and gentle stretching exercises once your acute pain subsides  -Would recommend following up with Dr. Redmond School for the elbow nodule      ED Prescriptions   None    PDMP not reviewed this encounter.   Elba Barman, DO 08/04/21 1032

## 2021-08-04 NOTE — ED Triage Notes (Signed)
Pt reports concerns of pulling a muscle in his back and hip pain x 3 days.  ? ?States he has  a knot on his elbow.  ?

## 2021-08-04 NOTE — Discharge Instructions (Addendum)
-   We provided a Toradol injection for you today, avoid using ibuprofen today, you may start tomorrow if you wish ?-Recommend heat to the low back ?-Begin range of motion and gentle stretching exercises once your acute pain subsides ? ?-Would recommend following up with Dr. Redmond School for the elbow nodule  ?

## 2021-08-11 ENCOUNTER — Other Ambulatory Visit: Payer: Self-pay

## 2021-08-11 ENCOUNTER — Encounter: Payer: Self-pay | Admitting: Family Medicine

## 2021-08-11 ENCOUNTER — Ambulatory Visit: Payer: Federal, State, Local not specified - PPO | Admitting: Family Medicine

## 2021-08-11 ENCOUNTER — Other Ambulatory Visit: Payer: Self-pay | Admitting: Family Medicine

## 2021-08-11 VITALS — BP 114/76 | HR 103 | Temp 97.9°F | Wt 242.2 lb

## 2021-08-11 DIAGNOSIS — M799 Soft tissue disorder, unspecified: Secondary | ICD-10-CM

## 2021-08-11 DIAGNOSIS — M7989 Other specified soft tissue disorders: Secondary | ICD-10-CM | POA: Diagnosis not present

## 2021-08-11 HISTORY — PX: SKIN BIOPSY: SHX1

## 2021-08-11 MED ORDER — LIDOCAINE-EPINEPHRINE 1 %-1:100000 IJ SOLN
10.0000 mL | Freq: Once | INTRAMUSCULAR | Status: AC
  Administered 2021-08-11: 10 mL

## 2021-08-11 MED ORDER — LIDOCAINE HCL 1 % IJ SOLN: 10.0000 mL | Freq: Once | INTRAMUSCULAR | Status: DC

## 2021-08-11 NOTE — Progress Notes (Signed)
Elbow lesion ? ?Subjective:  ? ? Patient ID: Corey [name], male    DOB: [birth year], 56 y.o.   MRN: [id number] ? ?HPI ?He is here for consult concerning recurrence of the lesion on his left elbow.  1 was removed in the past however it has grown back and is now causing some difficulty especially when he leans on his elbow. ? ? ?Review of Systems ? ?   ?Objective:  ? Physical Exam ?Left elbow exam shows full motion of the elbow.  Movable half centimeter lesion is noted visually. ? ? ? ?   ?Assessment & Plan:  ?Soft tissue lesion of elbow region - Plan: lidocaine-EPINEPHrine (XYLOCAINE W/EPI) 1 %-1:100000 (with pres) injection 10 mL, DISCONTINUED: lidocaine (XYLOCAINE) 1 % (with pres) injection 10 mL ?The area was injected with Xylocaine and epinephrine.  A 1 cm incision was made however the lesion was much larger and needed to be manually excised.  Difficult to say as to whether he got all of it out or not.  Two 5-0 sutures were applied to the area.  He is return here in 1 week for removal.  The lesion was sent for pathology. ? ?

## 2021-08-13 DIAGNOSIS — G4733 Obstructive sleep apnea (adult) (pediatric): Secondary | ICD-10-CM | POA: Diagnosis not present

## 2021-08-18 ENCOUNTER — Other Ambulatory Visit: Payer: Self-pay

## 2021-08-18 ENCOUNTER — Ambulatory Visit: Payer: Federal, State, Local not specified - PPO | Admitting: Family Medicine

## 2021-08-18 ENCOUNTER — Encounter: Payer: Self-pay | Admitting: Family Medicine

## 2021-08-18 VITALS — BP 138/90 | HR 90 | Temp 98.1°F | Wt 237.2 lb

## 2021-08-18 DIAGNOSIS — Z4802 Encounter for removal of sutures: Secondary | ICD-10-CM

## 2021-08-18 NOTE — Progress Notes (Signed)
? ?  Subjective:  ? ? Patient ID: Corey [name], male    DOB: 1965/12/04, 56 y.o.   MRN: [id number] ? ?HPI ?He is here for suture removal.  He had no difficulty with this.  The pathology report showed necrotic tissue. ? ? ?Review of Systems ? ?   ?Objective:  ? Physical Exam ?Exam of left elbow shows no evidence of infection.  2 sutures were removed. ? ? ? ?   ?Assessment & Plan:  ?Visit for suture removal ?Explained that if this occurs again it might need to be removed mainly if he is having difficulty mechanically from it. ? ?

## 2021-08-22 ENCOUNTER — Encounter: Payer: Self-pay | Admitting: Family Medicine

## 2021-09-13 DIAGNOSIS — G4733 Obstructive sleep apnea (adult) (pediatric): Secondary | ICD-10-CM | POA: Diagnosis not present

## 2021-09-15 DIAGNOSIS — G4733 Obstructive sleep apnea (adult) (pediatric): Secondary | ICD-10-CM | POA: Diagnosis not present

## 2021-10-14 DIAGNOSIS — G4733 Obstructive sleep apnea (adult) (pediatric): Secondary | ICD-10-CM | POA: Diagnosis not present

## 2021-11-03 ENCOUNTER — Ambulatory Visit (INDEPENDENT_AMBULATORY_CARE_PROVIDER_SITE_OTHER): Payer: Federal, State, Local not specified - PPO | Admitting: Family Medicine

## 2021-11-03 ENCOUNTER — Encounter: Payer: Self-pay | Admitting: Family Medicine

## 2021-11-03 VITALS — BP 134/88 | HR 68 | Temp 97.5°F | Ht 68.5 in | Wt 237.0 lb

## 2021-11-03 DIAGNOSIS — G4733 Obstructive sleep apnea (adult) (pediatric): Secondary | ICD-10-CM

## 2021-11-03 DIAGNOSIS — E6609 Other obesity due to excess calories: Secondary | ICD-10-CM | POA: Diagnosis not present

## 2021-11-03 DIAGNOSIS — R7301 Impaired fasting glucose: Secondary | ICD-10-CM | POA: Diagnosis not present

## 2021-11-03 DIAGNOSIS — E785 Hyperlipidemia, unspecified: Secondary | ICD-10-CM

## 2021-11-03 DIAGNOSIS — R030 Elevated blood-pressure reading, without diagnosis of hypertension: Secondary | ICD-10-CM | POA: Diagnosis not present

## 2021-11-03 DIAGNOSIS — Z6832 Body mass index (BMI) 32.0-32.9, adult: Secondary | ICD-10-CM

## 2021-11-03 DIAGNOSIS — Z0000 Encounter for general adult medical examination without abnormal findings: Secondary | ICD-10-CM

## 2021-11-03 DIAGNOSIS — Z9989 Dependence on other enabling machines and devices: Secondary | ICD-10-CM

## 2021-11-03 DIAGNOSIS — Z96652 Presence of left artificial knee joint: Secondary | ICD-10-CM

## 2021-11-03 DIAGNOSIS — M1990 Unspecified osteoarthritis, unspecified site: Secondary | ICD-10-CM

## 2021-11-03 LAB — COMPREHENSIVE METABOLIC PANEL
ALT: 41 IU/L (ref 0–44)
AST: 26 IU/L (ref 0–40)
Albumin/Globulin Ratio: 1.5 (ref 1.2–2.2)
Albumin: 4.4 g/dL (ref 3.8–4.9)
Alkaline Phosphatase: 71 IU/L (ref 44–121)
BUN/Creatinine Ratio: 10 (ref 9–20)
BUN: 14 mg/dL (ref 6–24)
Bilirubin Total: 0.3 mg/dL (ref 0.0–1.2)
CO2: 23 mmol/L (ref 20–29)
Calcium: 9.6 mg/dL (ref 8.7–10.2)
Chloride: 103 mmol/L (ref 96–106)
Creatinine, Ser: 1.35 mg/dL — ABNORMAL HIGH (ref 0.76–1.27)
Globulin, Total: 2.9 g/dL (ref 1.5–4.5)
Glucose: 112 mg/dL — ABNORMAL HIGH (ref 70–99)
Potassium: 4.9 mmol/L (ref 3.5–5.2)
Sodium: 138 mmol/L (ref 134–144)
Total Protein: 7.3 g/dL (ref 6.0–8.5)
eGFR: 63 mL/min/{1.73_m2} (ref >59)

## 2021-11-03 LAB — CBC WITH DIFFERENTIAL/PLATELET
Basophils Absolute: 0.1 10*3/uL (ref 0.0–0.2)
Basos: 1 %
EOS (ABSOLUTE): 0.1 10*3/uL (ref 0.0–0.4)
Eos: 3 %
Hematocrit: 44.1 % (ref 37.5–51.0)
Hemoglobin: 14.8 g/dL (ref 13.0–17.7)
Immature Grans (Abs): 0 10*3/uL (ref 0.0–0.1)
Immature Granulocytes: 0 %
Lymphocytes Absolute: 1.8 10*3/uL (ref 0.7–3.1)
Lymphs: 39 %
MCH: 28.1 pg (ref 26.6–33.0)
MCHC: 33.6 g/dL (ref 31.5–35.7)
MCV: 84 fL (ref 79–97)
Monocytes Absolute: 0.4 10*3/uL (ref 0.1–0.9)
Monocytes: 8 %
Neutrophils Absolute: 2.3 10*3/uL (ref 1.4–7.0)
Neutrophils: 49 %
Platelets: 284 10*3/uL (ref 150–450)
RBC: 5.27 x10E6/uL (ref 4.14–5.80)
RDW: 13 % (ref 11.6–15.4)
WBC: 4.6 10*3/uL (ref 3.4–10.8)

## 2021-11-03 LAB — LIPID PANEL
Chol/HDL Ratio: 4.9 ratio (ref 0.0–5.0)
Cholesterol, Total: 228 mg/dL — ABNORMAL HIGH (ref 100–199)
HDL: 47 mg/dL (ref >39)
LDL Chol Calc (NIH): 160 mg/dL — ABNORMAL HIGH (ref 0–99)
Triglycerides: 118 mg/dL (ref 0–149)
VLDL Cholesterol Cal: 21 mg/dL (ref 5–40)

## 2021-11-03 NOTE — Addendum Note (Signed)
Addended by: Denita Lung on: 11/03/2021 09:22 AM   Modules accepted: Orders

## 2021-11-03 NOTE — Patient Instructions (Signed)
Check out the DASH diet.  At least 20 minutes of something physical daily or 150 minutes week of something physical

## 2021-11-03 NOTE — Progress Notes (Addendum)
Complete physical exam  Patient: Corey [name]   DOB: 05-02-1966   56 y.o. Male  MRN: [id number]  Subjective:      Corey [name] is a 56 y.o. male who presents today for a complete physical exam. He reports consuming a general diet. Gym/ health club routine includes cardio and mod to heavy weightlifting. He generally feels well. He reports sleeping fairly well; he uses his CPAP regularly He does not have additional problems to discuss today.  He plans to get married in early September.  He is not having any ED for premature ejaculation issues.  He does not complain of any arthritic symptoms.  His left knee is doing well after his TKR.  Family and social history as well as health maintenance and immunizations was reviewed   Most recent fall risk assessment:    08/31/2016    8:30 AM  [place] in the past year? No     Most recent depression screenings:    11/03/2021    8:24 AM 10/07/2020    8:35 AM  PHQ 2/9 Scores  PHQ - 2 Score 0 1      Patient Active Problem List   Diagnosis Date Noted   Status post laparoscopic appendectomy 01/14/2021   H/O total knee replacement, left 11/11/2020   Leiomyoma 11/08/2020   Arthritis 09/26/2018   Onychomycosis 09/26/2018   Class 1 obesity due to excess calories without serious comorbidity with body mass index (BMI) of 32.0 to 32.9 in adult 09/06/2017   Hyperlipidemia LDL goal <100 08/31/2016   OSA on CPAP 08/31/2016   Premature ejaculation 08/19/2011   Past Medical History:  Diagnosis Date   Arthritis    knee, left   ED (erectile dysfunction)    Hyperlipidemia    Hypertension    Sleep apnea    CPAP - not using lately due to recall    Past Surgical History:  Procedure Laterality Date   ANKLE BONE SURGERY     BACK SURGERY     COLLAR BONE SURGEY     COLONOSCOPY     FRACTURE SURGERY     HERNIA REPAIR     KNEE ARTHROSCOPY Left 2006   LAPAROSCOPIC APPENDECTOMY N/A 01/13/2021   Procedure: APPENDECTOMY LAPAROSCOPIC WITH  LYSIS OF ADHESIONS;  Surgeon: Michael Boston, MD;  Location: [facility];  Service: General;  Laterality: N/A;   LUMBAR MICRODISCECTOMY     POLYPECTOMY     SKIN BIOPSY Left 08/11/2021   nodulocystic fat nercrosis   TOTAL KNEE ARTHROPLASTY Left 11/11/2020   Procedure: LEFT TOTAL KNEE ARTHROPLASTY;  Surgeon: Marybelle Killings, MD;  Location: [facility];  Service: Orthopedics;  Laterality: Left;   UMBILICAL HERNIA REPAIR     Social History   Tobacco Use   Smoking status: Never   Smokeless tobacco: Never  Vaping Use   Vaping Use: Never used  Substance Use Topics   Alcohol use: Yes    Alcohol/week: 3.0 standard drinks    Types: 3 Glasses of wine per week    Comment: occasional   Drug use: Not Currently    Types: Marijuana   Family History  Problem Relation Age of Onset   Alzheimer's disease Mother    Stevens-Johnson syndrome Father    Autism Brother    Colon cancer Other    Colon polyps Neg Hx    Esophageal cancer Neg Hx    Rectal cancer Neg Hx    Stomach cancer Neg Hx  Allergies  Allergen Reactions   Penicillins Other (See Comments)    Makes pt black out      Patient Care Team: Denita Lung, MD as PCP - General (Family Medicine) Michael Boston, MD as Consulting Physician (General Surgery)   Outpatient Medications Prior to Visit  Medication Sig   Multiple Vitamins-Minerals (MULTIVITAMIN WITH MINERALS) tablet Take 2 tablets by mouth daily.   PARoxetine (PAXIL) 20 MG tablet Take 1 tablet (20 mg total) by mouth daily. TAKE 1 TABLET BY MOUTH EVERY DAY IN THE MORNING   No facility-administered medications prior to visit.    Review of Systems  All other systems reviewed and are negative.        Objective:     BP 134/88   Pulse 68   Temp (!) 97.5 F (36.4 C)   Ht 5' 8.5" (1.74 m)   Wt 237 lb (107.5 kg)   SpO2 98%   BMI 35.51 kg/m  BP Readings from Last 3 Encounters:  11/03/21 134/88  08/18/21 138/90  08/11/21 114/76      Physical Exam   Last CBC Lab Results   Component Value Date   WBC 13.9 (H) 01/14/2021   HGB 10.0 (L) 01/14/2021   HCT 32.7 (L) 01/14/2021   MCV 88.4 01/14/2021   MCH 27.0 01/14/2021   RDW 14.5 01/14/2021   PLT 232 60/63/0160   Last metabolic panel Lab Results  Component Value Date   GLUCOSE 142 (H) 01/12/2021   NA 137 01/12/2021   K 4.0 01/12/2021   CL 98 01/12/2021   CO2 26 01/12/2021   BUN 12 01/12/2021   CREATININE 1.17 01/12/2021   GFRNONAA >60 01/12/2021   CALCIUM 9.2 01/12/2021   PROT 7.8 01/12/2021   ALBUMIN 3.9 01/12/2021   LABGLOB 2.8 10/07/2020   AGRATIO 1.6 10/07/2020   BILITOT 0.7 01/12/2021   ALKPHOS 66 01/12/2021   AST 22 01/12/2021   ALT 25 01/12/2021   ANIONGAP 13 01/12/2021   Last lipids Lab Results  Component Value Date   CHOL 220 (H) 04/21/2021   HDL 39 (L) 04/21/2021   LDLCALC 155 (H) 04/21/2021   TRIG 144 04/21/2021   CHOLHDL 5.6 (H) 04/21/2021        Assessment & Plan:   Routine general medical examination at a health care facility - Plan: CBC with Differential/Platelet, Comprehensive metabolic panel, Lipid panel  OSA on CPAP  Hyperlipidemia LDL goal <100 - Plan: Lipid panel  Class 1 obesity due to excess calories without serious comorbidity with body mass index (BMI) of 32.0 to 32.9 in adult - Plan: CBC with Differential/Platelet, Comprehensive metabolic panel, Lipid panel  H/O total knee replacement, left  Arthritis  Elevated blood pressure reading - Plan: CBC with Differential/Platelet, Comprehensive metabolic panel  Immunization History  Administered Date(s) Administered   Janssen (J&J) SARS-COV-2 Vaccination 09/11/2019, 05/14/2020   Moderna Covid-19 Vaccine Bivalent Booster 32yr & up 04/21/2021   Tdap 06/04/2010, 10/07/2020    Health Maintenance  Topic Date Due   Zoster Vaccines- Shingrix (1 of 2) 08/28/2022 (Originally 05/19/1987)   INFLUENZA VACCINE  12/30/2021   COLONOSCOPY (Pts 45-413yrInsurance coverage will need to be confirmed)  08/06/2030    TETANUS/TDAP  10/08/2030   COVID-19 Vaccine  Completed   Hepatitis C Screening  Completed   HIV Screening  Completed   HPV VACCINES  Aged Out    Discussed health benefits of physical activity, and encouraged him to engage in regular exercise appropriate for his age and condition.  Also recommend that he check into the DASH diet.  Problem List Items Addressed This Visit     Arthritis   Class 1 obesity due to excess calories without serious comorbidity with body mass index (BMI) of 32.0 to 32.9 in adult   Relevant Orders   CBC with Differential/Platelet   Comprehensive metabolic panel   Lipid panel   H/O total knee replacement, left   Hyperlipidemia LDL goal <100   Relevant Orders   Lipid panel   OSA on CPAP   Other Visit Diagnoses     Routine general medical examination at a health care facility    -  Primary   Relevant Orders   CBC with Differential/Platelet   Comprehensive metabolic panel   Lipid panel   Elevated blood pressure reading       Relevant Orders   CBC with Differential/Platelet   Comprehensive metabolic panel     Encouraged him to continue with diet and exercise.  And continue to use CPAP Return here in 3 months for blood pressure recheck.     Jill Alexanders, MD

## 2021-11-04 LAB — HGB A1C W/O EAG: Hgb A1c MFr Bld: 6.2 % — ABNORMAL HIGH (ref 4.8–5.6)

## 2021-11-04 LAB — SPECIMEN STATUS REPORT

## 2021-11-10 ENCOUNTER — Other Ambulatory Visit: Payer: Self-pay | Admitting: Family Medicine

## 2021-11-10 DIAGNOSIS — F524 Premature ejaculation: Secondary | ICD-10-CM

## 2021-11-10 NOTE — Telephone Encounter (Signed)
[facility] is requesting to fill pt paxil. Please advise [facility]

## 2021-11-14 DIAGNOSIS — G4733 Obstructive sleep apnea (adult) (pediatric): Secondary | ICD-10-CM | POA: Diagnosis not present

## 2021-12-14 DIAGNOSIS — G4733 Obstructive sleep apnea (adult) (pediatric): Secondary | ICD-10-CM | POA: Diagnosis not present

## 2021-12-22 DIAGNOSIS — G4733 Obstructive sleep apnea (adult) (pediatric): Secondary | ICD-10-CM | POA: Diagnosis not present

## 2021-12-29 ENCOUNTER — Telehealth: Payer: Self-pay | Admitting: Orthopaedic Surgery

## 2021-12-29 NOTE — Telephone Encounter (Signed)
Corey [name] from [facility] called. Patient will be having a procedure done and would like something faxed stating that no medication is needed. The fax number is [phone]

## 2022-01-13 DIAGNOSIS — G4733 Obstructive sleep apnea (adult) (pediatric): Secondary | ICD-10-CM | POA: Diagnosis not present

## 2022-02-04 ENCOUNTER — Encounter: Payer: Self-pay | Admitting: Internal Medicine

## 2022-02-13 DIAGNOSIS — G4733 Obstructive sleep apnea (adult) (pediatric): Secondary | ICD-10-CM | POA: Diagnosis not present

## 2022-02-16 ENCOUNTER — Ambulatory Visit: Payer: Federal, State, Local not specified - PPO | Admitting: Family Medicine

## 2022-02-16 ENCOUNTER — Encounter: Payer: Self-pay | Admitting: Family Medicine

## 2022-02-16 VITALS — BP 132/86 | HR 102 | Temp 98.2°F | Wt 235.0 lb

## 2022-02-16 DIAGNOSIS — Z9989 Dependence on other enabling machines and devices: Secondary | ICD-10-CM | POA: Diagnosis not present

## 2022-02-16 DIAGNOSIS — G4733 Obstructive sleep apnea (adult) (pediatric): Secondary | ICD-10-CM

## 2022-02-16 DIAGNOSIS — R030 Elevated blood-pressure reading, without diagnosis of hypertension: Secondary | ICD-10-CM | POA: Diagnosis not present

## 2022-02-16 NOTE — Progress Notes (Signed)
   Subjective:    Patient ID: Corey [name], male    DOB: 08/25/1965, 57 y.o.   MRN: [id number]  HPI He is here for a recheck.  He is using his CPAP regularly and has started exercising more.  He does check his blood pressure at home but we have not measured against ours.  He states that he is running 130/84 at home.   Review of Systems     Objective:   Physical Exam Alert and in no distress.  Blood pressure is recorded.       Assessment & Plan:  OSA on CPAP  Elevated blood pressure reading I will check him again in 2 months.  He is to bring his blood pressure cuff in, continue on his CPAP and with his exercise. Flu shot was offered but he refused

## 2022-03-10 ENCOUNTER — Encounter: Payer: Self-pay | Admitting: Internal Medicine

## 2022-03-15 DIAGNOSIS — G4733 Obstructive sleep apnea (adult) (pediatric): Secondary | ICD-10-CM | POA: Diagnosis not present

## 2022-03-23 ENCOUNTER — Encounter: Payer: Self-pay | Admitting: Internal Medicine

## 2022-04-13 DIAGNOSIS — G4733 Obstructive sleep apnea (adult) (pediatric): Secondary | ICD-10-CM | POA: Diagnosis not present

## 2022-04-15 DIAGNOSIS — G4733 Obstructive sleep apnea (adult) (pediatric): Secondary | ICD-10-CM | POA: Diagnosis not present

## 2022-04-27 ENCOUNTER — Encounter: Payer: Self-pay | Admitting: Family Medicine

## 2022-04-27 ENCOUNTER — Ambulatory Visit: Payer: Federal, State, Local not specified - PPO | Admitting: Family Medicine

## 2022-04-27 VITALS — BP 136/90 | HR 66 | Temp 98.4°F | Wt 249.6 lb

## 2022-04-27 DIAGNOSIS — I10 Essential (primary) hypertension: Secondary | ICD-10-CM | POA: Diagnosis not present

## 2022-04-27 NOTE — Progress Notes (Signed)
   Subjective:    Patient ID: Corey [name], male    DOB: [birth year], 56 y.o.   MRN: [id number]  HPI He is here for recheck on his blood pressure.   Review of Systems     Objective:   Physical Exam Alert and in no distress.  Blood pressure is recorded.       Assessment & Plan:  Primary hypertension I explained that his blood pressure has remained elevated to the point that therapy I think is needed although he is certainly on the low end of the spectrum.  Discussed placing him on medication however he is not interested.  Plans to continue with his diet and exercise regimen.  Discussed the need to treat this in order to reduce the risk of stroke, heart failure and kidney failure.

## 2022-05-13 DIAGNOSIS — G4733 Obstructive sleep apnea (adult) (pediatric): Secondary | ICD-10-CM | POA: Diagnosis not present

## 2022-06-13 DIAGNOSIS — G4733 Obstructive sleep apnea (adult) (pediatric): Secondary | ICD-10-CM | POA: Diagnosis not present

## 2022-07-16 DIAGNOSIS — G4733 Obstructive sleep apnea (adult) (pediatric): Secondary | ICD-10-CM | POA: Diagnosis not present

## 2022-08-14 DIAGNOSIS — G4733 Obstructive sleep apnea (adult) (pediatric): Secondary | ICD-10-CM | POA: Diagnosis not present

## 2022-09-14 DIAGNOSIS — G4733 Obstructive sleep apnea (adult) (pediatric): Secondary | ICD-10-CM | POA: Diagnosis not present

## 2022-09-21 DIAGNOSIS — G4733 Obstructive sleep apnea (adult) (pediatric): Secondary | ICD-10-CM | POA: Diagnosis not present

## 2022-10-14 DIAGNOSIS — G4733 Obstructive sleep apnea (adult) (pediatric): Secondary | ICD-10-CM | POA: Diagnosis not present

## 2022-10-21 IMAGING — DX DG KNEE 1-2V PORT*L*
2 series · 2 of 2 positions shown · non-contrast
Comparison: Plain films left knee 10/16/2020.

CLINICAL DATA: Status post left knee replacement.

EXAM:
PORTABLE LEFT KNEE - 1-2 VIEW

[knee ap]
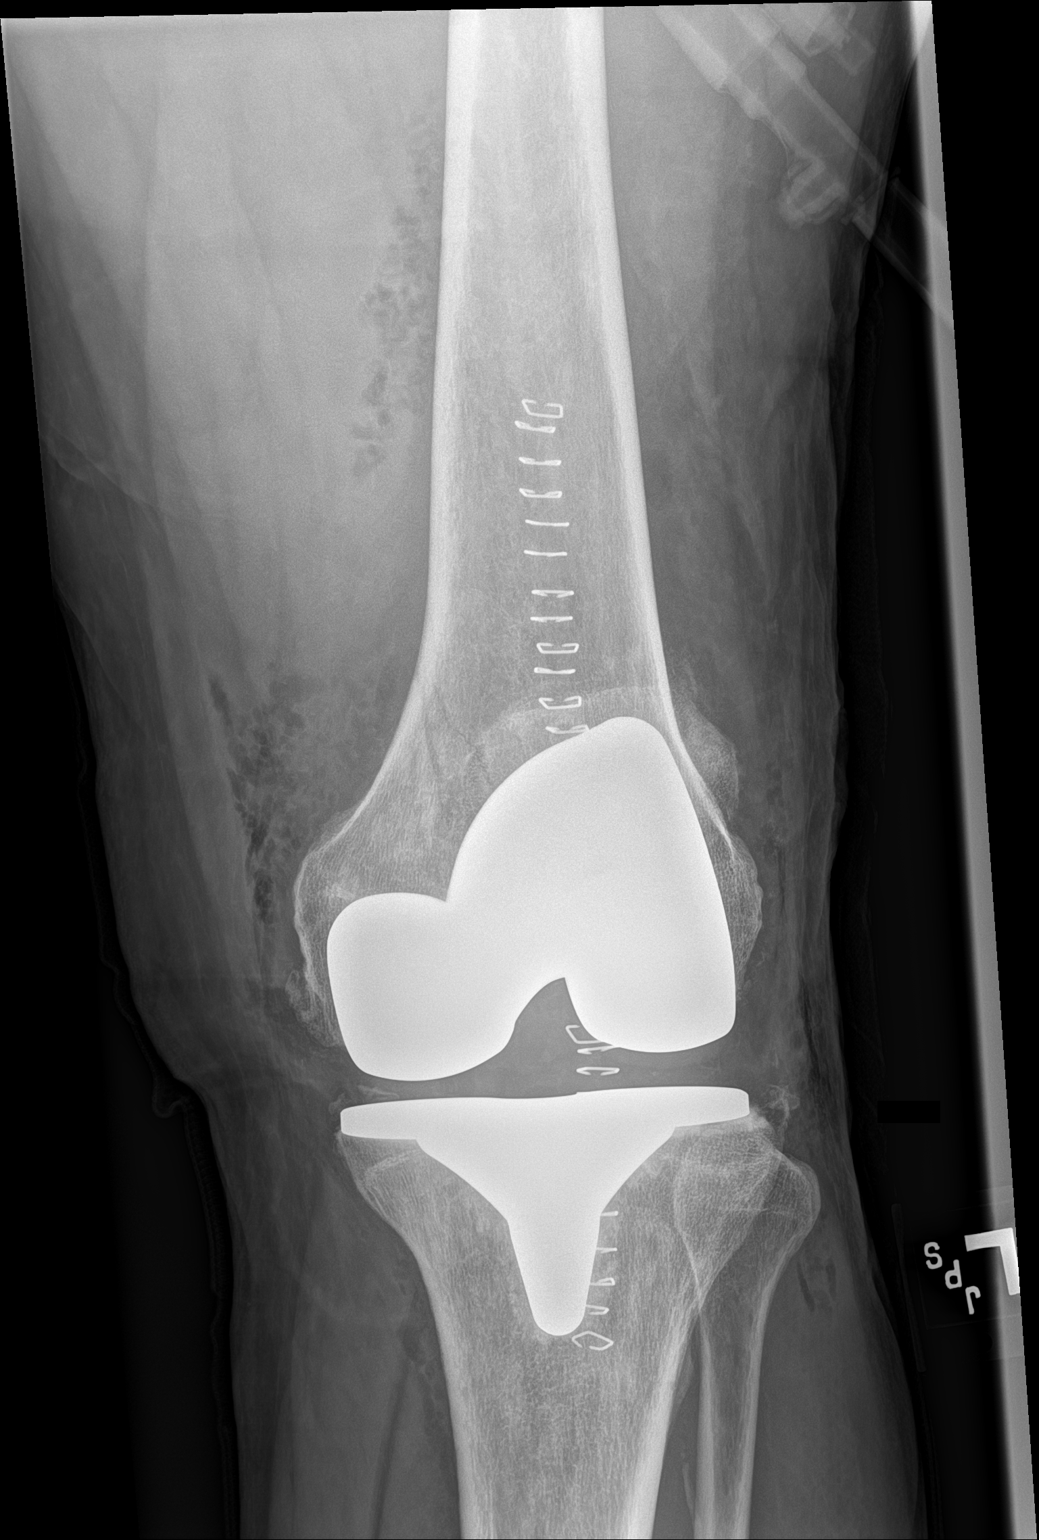

[knee lat]
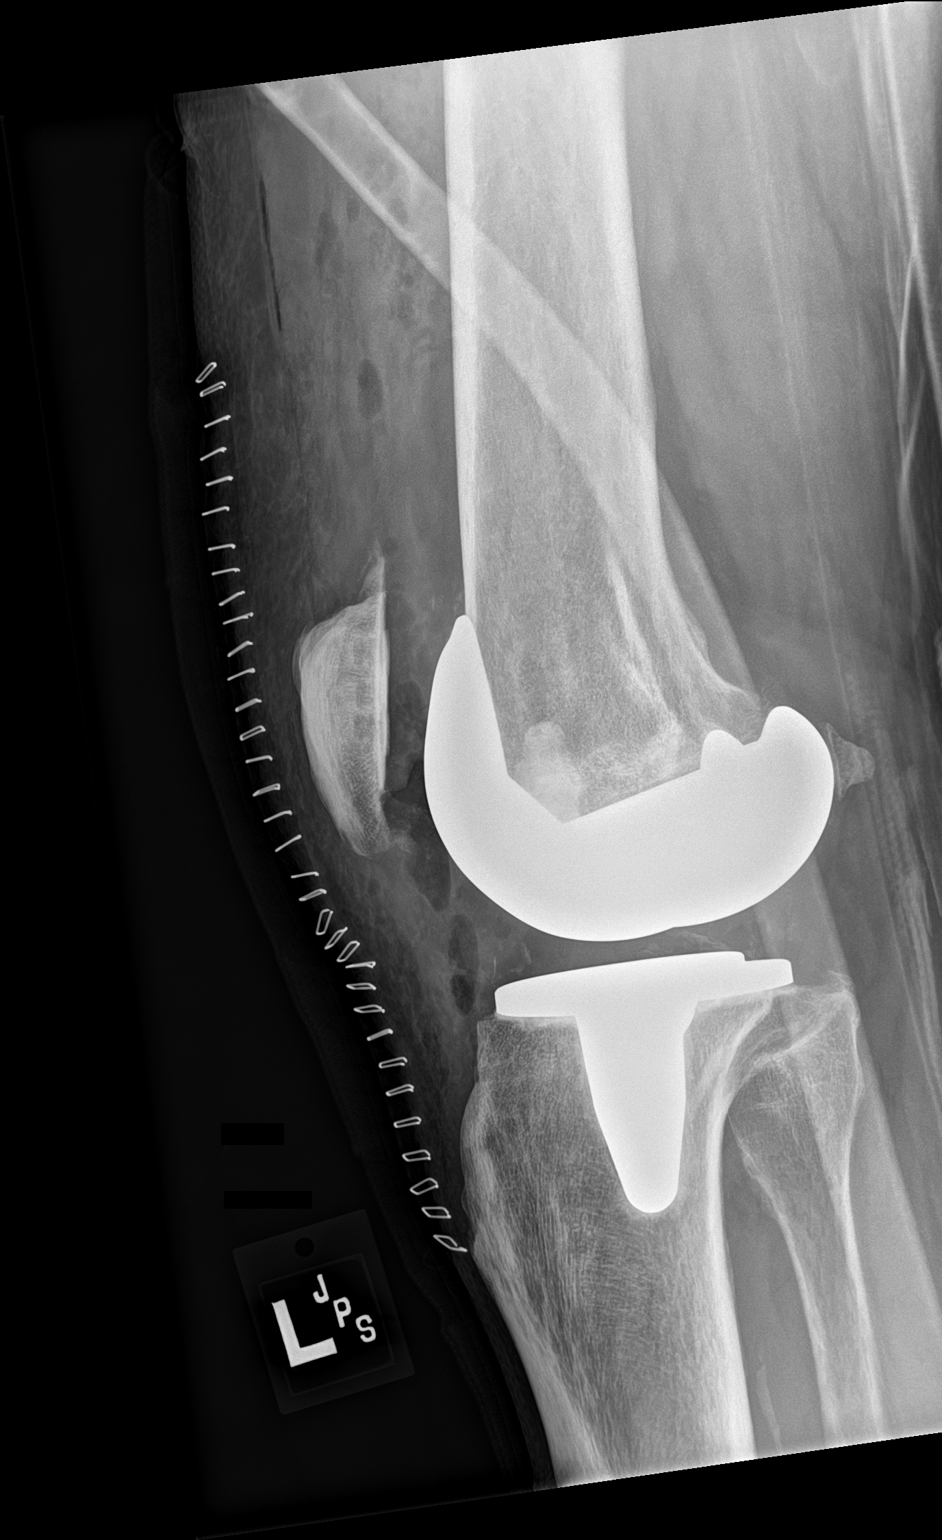

[2 of 2 positions shown; findings below may reference images not displayed]

FINDINGS: New left total knee arthroplasty is in place. No hardware
complication or acute bony abnormality is seen. Surgical staples
noted.
IMPRESSION: Status post left knee replacement.  No acute abnormality.

## 2022-10-29 ENCOUNTER — Other Ambulatory Visit: Payer: Self-pay | Admitting: Family Medicine

## 2022-10-29 DIAGNOSIS — F524 Premature ejaculation: Secondary | ICD-10-CM

## 2022-10-29 NOTE — Telephone Encounter (Signed)
Pt has upcoming appointment in mid June. Will refill for 30 days

## 2022-11-14 DIAGNOSIS — G4733 Obstructive sleep apnea (adult) (pediatric): Secondary | ICD-10-CM | POA: Diagnosis not present

## 2022-11-19 ENCOUNTER — Ambulatory Visit (INDEPENDENT_AMBULATORY_CARE_PROVIDER_SITE_OTHER): Payer: Federal, State, Local not specified - PPO | Admitting: Family Medicine

## 2022-11-19 ENCOUNTER — Encounter: Payer: Self-pay | Admitting: Family Medicine

## 2022-11-19 VITALS — BP 124/86 | HR 81 | Temp 98.5°F | Resp 16 | Wt 234.0 lb

## 2022-11-19 DIAGNOSIS — Z96652 Presence of left artificial knee joint: Secondary | ICD-10-CM

## 2022-11-19 DIAGNOSIS — M1990 Unspecified osteoarthritis, unspecified site: Secondary | ICD-10-CM

## 2022-11-19 DIAGNOSIS — R7303 Prediabetes: Secondary | ICD-10-CM | POA: Diagnosis not present

## 2022-11-19 DIAGNOSIS — E785 Hyperlipidemia, unspecified: Secondary | ICD-10-CM

## 2022-11-19 DIAGNOSIS — Z0000 Encounter for general adult medical examination without abnormal findings: Secondary | ICD-10-CM | POA: Diagnosis not present

## 2022-11-19 DIAGNOSIS — G4733 Obstructive sleep apnea (adult) (pediatric): Secondary | ICD-10-CM

## 2022-11-19 DIAGNOSIS — Z131 Encounter for screening for diabetes mellitus: Secondary | ICD-10-CM

## 2022-11-19 DIAGNOSIS — F524 Premature ejaculation: Secondary | ICD-10-CM

## 2022-11-19 DIAGNOSIS — Z6832 Body mass index (BMI) 32.0-32.9, adult: Secondary | ICD-10-CM

## 2022-11-19 DIAGNOSIS — E6609 Other obesity due to excess calories: Secondary | ICD-10-CM

## 2022-11-19 LAB — LIPID PANEL

## 2022-11-19 LAB — POCT GLYCOSYLATED HEMOGLOBIN (HGB A1C): Hemoglobin A1C: 6.3 % — AB (ref 4.0–5.6)

## 2022-11-19 NOTE — Progress Notes (Signed)
Complete physical exam  Patient: Corey [name]   DOB: Sep 29, 1965   57 y.o. Male  MRN: [id number]  Subjective:    Chief Complaint  Patient presents with   Annual Exam    Fasting.     Corey [name] is a 57 y.o. male who presents today for a complete physical exam. He reports consuming a general diet. Gym/ health club routine includes cardio and weight lifting. He generally feels fairly well. He reports sleeping fairly well.  He does have OSA and is on CPAP.  He is getting good readouts on his machine and definitely can tell the difference.  He is engaged to be married and is looking forward to this.  He has gotten some counseling concerning all this.  He does have premature ejaculation and does use Paxil on an as-needed basis..  Previous blood work did show slightly elevated glucose.  He does have a history of hyperlipidemia.  He has had a TKR and seems be doing fairly well from that.  Otherwise he has no particular concerns or complaints.  Family and social history as well as health maintenance and immunizations was reviewed.   Most recent fall risk assessment:    11/19/2022    1:31 PM  Fall Risk   Falls in the past year? 0  Number falls in past yr: 0  Injury with Fall? 0  Risk for fall due to : No Fall Risks  Follow up Falls evaluation completed     Most recent depression screenings:    11/19/2022    1:31 PM 11/03/2021    8:24 AM  PHQ 2/9 Scores  PHQ - 2 Score 0 0    Vision:Within last year and Dental: Receives regular dental care    Patient Care Team: Ronnald Nian, MD as PCP - General (Family Medicine) Karie Soda, MD as Consulting Physician (General Surgery)   Outpatient Medications Prior to Visit  Medication Sig   Multiple Vitamins-Minerals (HAIR SKIN AND NAILS FORMULA PO) Take by mouth.   Multiple Vitamins-Minerals (MULTIVITAMIN WITH MINERALS) tablet Take 2 tablets by mouth daily.   PARoxetine (PAXIL) 20 MG tablet TAKE 1 TABLET BY MOUTH EVERY DAY IN THE  MORNING   No facility-administered medications prior to visit.    Review of Systems  All other systems reviewed and are negative.         Objective:  Alert and in no distress. Tympanic membranes and canals are normal. Pharyngeal area is normal. Neck is supple without adenopathy or thyromegaly. Cardiac exam shows a regular sinus rhythm without murmurs or gallops. Lungs are clear to auscultation.    BP 124/86   Pulse 81   Temp 98.5 F (36.9 C) (Oral)   Resp 16   Wt 234 lb (106.1 kg)   SpO2 98% Comment: room air  BMI 35.06 kg/m    Physical Exam   Results for orders placed or performed in visit on 11/19/22  POCT glycosylated hemoglobin (Hb A1C)  Result Value Ref Range   Hemoglobin A1C 6.3 (A) 4.0 - 5.6 %       Assessment & Plan:    Routine general medical examination at a health care facility - Plan: CBC with Differential/Platelet, Comprehensive metabolic panel, Lipid panel  OSA on CPAP  Arthritis  Hyperlipidemia LDL goal <100 - Plan: Lipid panel  H/O total knee replacement, left  Class 1 obesity due to excess calories without serious comorbidity with body mass index (BMI) of 32.0 to 32.9 in adult  Premature ejaculation  Screening for diabetes mellitus - Plan: CANCELED: Hemoglobin A1c  Prediabetes - Plan: POCT glycosylated hemoglobin (Hb A1C)  Immunization History  Administered Date(s) Administered   Janssen (J&J) SARS-COV-2 Vaccination 09/11/2019, 05/14/2020   Moderna Covid-19 Vaccine Bivalent Booster 59yrs & up 04/21/2021   Tdap 06/04/2010, 10/07/2020    Health Maintenance  Topic Date Due   Zoster Vaccines- Shingrix (1 of 2) Never done   COVID-19 Vaccine (4 - 2023-24 season) 01/30/2022   INFLUENZA VACCINE  12/31/2022   Colonoscopy  08/06/2030   DTaP/Tdap/Td (3 - Td or Tdap) 10/08/2030   Hepatitis C Screening  Completed   HIV Screening  Completed   HPV VACCINES  Aged Out    Discussed health benefits of physical activity, and encouraged him to  engage in regular exercise appropriate for his age and condition.  I then discussed prediabetes with him in detail in regard to also making changes in his eating habits specifically cutting back on carbohydrates.  Explained the risk of diabetes in regard to stroke, blindness, heart and renal failure, neurologic complications.  Problem List Items Addressed This Visit     Arthritis   Class 1 obesity due to excess calories without serious comorbidity with body mass index (BMI) of 32.0 to 32.9 in adult   H/O total knee replacement, left   Hyperlipidemia LDL goal <100   Relevant Orders   Lipid panel   OSA on CPAP   Prediabetes   Relevant Orders   POCT glycosylated hemoglobin (Hb A1C) (Completed)   Premature ejaculation   Other Visit Diagnoses     Routine general medical examination at a health care facility    -  Primary   Relevant Orders   CBC with Differential/Platelet   Comprehensive metabolic panel   Lipid panel   Screening for diabetes mellitus          Follow-up 1 year    Sharlot Gowda, MD

## 2022-11-20 LAB — CBC WITH DIFFERENTIAL/PLATELET
Basophils Absolute: 0 10*3/uL (ref 0.0–0.2)
Basos: 1 %
EOS (ABSOLUTE): 0.1 10*3/uL (ref 0.0–0.4)
Eos: 3 %
Hematocrit: 43.1 % (ref 37.5–51.0)
Hemoglobin: 14.2 g/dL (ref 13.0–17.7)
Immature Grans (Abs): 0 10*3/uL (ref 0.0–0.1)
Immature Granulocytes: 0 %
Lymphocytes Absolute: 2.4 10*3/uL (ref 0.7–3.1)
Lymphs: 46 %
MCH: 27.9 pg (ref 26.6–33.0)
MCHC: 32.9 g/dL (ref 31.5–35.7)
MCV: 85 fL (ref 79–97)
Monocytes Absolute: 0.3 10*3/uL (ref 0.1–0.9)
Monocytes: 6 %
Neutrophils Absolute: 2.2 10*3/uL (ref 1.4–7.0)
Neutrophils: 44 %
Platelets: 291 10*3/uL (ref 150–450)
RBC: 5.09 x10E6/uL (ref 4.14–5.80)
RDW: 14.1 % (ref 11.6–15.4)
WBC: 5.1 10*3/uL (ref 3.4–10.8)

## 2022-11-20 LAB — COMPREHENSIVE METABOLIC PANEL
ALT: 32 IU/L (ref 0–44)
AST: 24 IU/L (ref 0–40)
Albumin: 4.5 g/dL (ref 3.8–4.9)
Alkaline Phosphatase: 68 IU/L (ref 44–121)
BUN/Creatinine Ratio: 9 (ref 9–20)
BUN: 12 mg/dL (ref 6–24)
Bilirubin Total: 0.3 mg/dL (ref 0.0–1.2)
CO2: 25 mmol/L (ref 20–29)
Calcium: 9.5 mg/dL (ref 8.7–10.2)
Chloride: 103 mmol/L (ref 96–106)
Creatinine, Ser: 1.38 mg/dL — ABNORMAL HIGH (ref 0.76–1.27)
Globulin, Total: 2.8 g/dL (ref 1.5–4.5)
Glucose: 93 mg/dL (ref 70–99)
Potassium: 4.3 mmol/L (ref 3.5–5.2)
Sodium: 142 mmol/L (ref 134–144)
Total Protein: 7.3 g/dL (ref 6.0–8.5)
eGFR: 61 mL/min/{1.73_m2} (ref >59)

## 2022-11-20 LAB — LIPID PANEL
Chol/HDL Ratio: 4.8 ratio (ref 0.0–5.0)
Cholesterol, Total: 208 mg/dL — ABNORMAL HIGH (ref 100–199)
HDL: 43 mg/dL (ref >39)
LDL Chol Calc (NIH): 140 mg/dL — ABNORMAL HIGH (ref 0–99)
Triglycerides: 137 mg/dL (ref 0–149)
VLDL Cholesterol Cal: 25 mg/dL (ref 5–40)

## 2022-11-20 LAB — HEMOGLOBIN A1C
Est. average glucose Bld gHb Est-mCnc: 128 mg/dL
Hgb A1c MFr Bld: 6.1 % — ABNORMAL HIGH (ref 4.8–5.6)

## 2022-11-23 ENCOUNTER — Other Ambulatory Visit: Payer: Self-pay | Admitting: Family Medicine

## 2022-11-23 DIAGNOSIS — F524 Premature ejaculation: Secondary | ICD-10-CM

## 2022-11-23 NOTE — Telephone Encounter (Signed)
Refill request last apt 11/19/22.

## 2022-11-28 ENCOUNTER — Encounter (HOSPITAL_COMMUNITY): Payer: Self-pay

## 2022-11-28 ENCOUNTER — Ambulatory Visit (HOSPITAL_COMMUNITY)
Admission: EM | Admit: 2022-11-28 | Discharge: 2022-11-28 | Disposition: A | Discharge status: Home / Self Care | Payer: Federal, State, Local not specified - PPO | Attending: Physician Assistant | Admitting: Physician Assistant

## 2022-11-28 DIAGNOSIS — W57XXXA Bitten or stung by nonvenomous insect and other nonvenomous arthropods, initial encounter: Secondary | ICD-10-CM | POA: Diagnosis not present

## 2022-11-28 DIAGNOSIS — H938X2 Other specified disorders of left ear: Secondary | ICD-10-CM

## 2022-11-28 DIAGNOSIS — H6012 Cellulitis of left external ear: Secondary | ICD-10-CM

## 2022-11-28 DIAGNOSIS — S00462A Insect bite (nonvenomous) of left ear, initial encounter: Secondary | ICD-10-CM

## 2022-11-28 MED ORDER — SULFAMETHOXAZOLE-TRIMETHOPRIM 800-160 MG PO TABS: 1.0000 | ORAL_TABLET | Freq: Two times a day (BID) | ORAL | 0 refills | Status: AC

## 2022-11-28 MED ORDER — CETIRIZINE HCL 10 MG PO TABS: 10.0000 mg | ORAL_TABLET | Freq: Every day | ORAL | 0 refills | Status: DC

## 2022-11-28 MED ORDER — PREDNISONE 20 MG PO TABS: 40.0000 mg | ORAL_TABLET | Freq: Every day | ORAL | 0 refills | Status: AC

## 2022-11-28 MED ORDER — FAMOTIDINE 40 MG PO TABS: 40.0000 mg | ORAL_TABLET | Freq: Every day | ORAL | 0 refills | Status: DC

## 2022-11-28 NOTE — ED Provider Notes (Addendum)
[facility]    CSN: [id number] Arrival date & time: 11/28/22  1054      History   Chief Complaint Chief Complaint  Patient presents with   Insect Bite    Bee sting on left outer ear caused swelling of left ear and face - Entered by patient    HPI Corey [name] is a 57 y.o. male.   Patient presents today with a 3-day history of swelling of his left ear.  Reports that 3 days ago he was stung by a small bee.  He denies previous reaction to bee stings but did initially have some discomfort and itching.  He took an antihistamine and this resolved but over the past several days this has become slightly painful and more swollen.  He has continued taking antihistamines regularly as well as using ice without improvement of symptoms.  Denies any fever, nausea, vomiting.  Denies any recent antibiotics or steroids.  He denies any swelling of his throat, shortness of breath, muffled voice.    Past Medical History:  Diagnosis Date   Arthritis    knee, left   ED (erectile dysfunction)    Hyperlipidemia    Hypertension    Sleep apnea    CPAP - not using lately due to recall     Patient Active Problem List   Diagnosis Date Noted   Prediabetes 11/19/2022   Status post laparoscopic appendectomy 01/14/2021   H/O total knee replacement, left 11/11/2020   Leiomyoma 11/08/2020   Arthritis 09/26/2018   Onychomycosis 09/26/2018   Class 1 obesity due to excess calories without serious comorbidity with body mass index (BMI) of 32.0 to 32.9 in adult 09/06/2017   Hyperlipidemia LDL goal <100 08/31/2016   OSA on CPAP 08/31/2016   Premature ejaculation 08/19/2011    Past Surgical History:  Procedure Laterality Date   ANKLE BONE SURGERY     BACK SURGERY     COLLAR BONE SURGEY     COLONOSCOPY     FRACTURE SURGERY     HERNIA REPAIR     KNEE ARTHROSCOPY Left 2006   LAPAROSCOPIC APPENDECTOMY N/A 01/13/2021   Procedure: APPENDECTOMY LAPAROSCOPIC WITH LYSIS OF ADHESIONS;  Surgeon:  Karie Soda, MD;  Location: [facility];  Service: General;  Laterality: N/A;   LUMBAR MICRODISCECTOMY     POLYPECTOMY     SKIN BIOPSY Left 08/11/2021   nodulocystic fat nercrosis   TOTAL KNEE ARTHROPLASTY Left 11/11/2020   Procedure: LEFT TOTAL KNEE ARTHROPLASTY;  Surgeon: Eldred Manges, MD;  Location: [facility];  Service: Orthopedics;  Laterality: Left;   UMBILICAL HERNIA REPAIR         Home Medications    Prior to Admission medications   Medication Sig Start Date End Date Taking? Authorizing Provider  cetirizine (ZYRTEC ALLERGY) 10 MG tablet Take 1 tablet (10 mg total) by mouth at bedtime. 11/28/22  Yes Tatyanna Cronk K, PA-C  famotidine (PEPCID) 40 MG tablet Take 1 tablet (40 mg total) by mouth at bedtime. 11/28/22  Yes Gesselle Fitzsimons K, PA-C  Multiple Vitamins-Minerals (HAIR SKIN AND NAILS FORMULA PO) Take by mouth.   Yes [provider]  Multiple Vitamins-Minerals (MULTIVITAMIN WITH MINERALS) tablet Take 2 tablets by mouth daily.   Yes [provider]  PARoxetine (PAXIL) 20 MG tablet TAKE 1 TABLET BY MOUTH EVERY DAY IN THE MORNING 11/24/22  Yes Ronnald Nian, MD  predniSONE (DELTASONE) 20 MG tablet Take 2 tablets (40 mg total) by mouth daily for 5  days. 11/28/22 12/03/22 Yes Ulus Hazen, Denny Peon K, PA-C  sulfamethoxazole-trimethoprim (BACTRIM DS) 800-160 MG tablet Take 1 tablet by mouth 2 (two) times daily for 7 days. 11/28/22 12/05/22 Yes Absalom Aro, Noberto Retort, PA-C    Family History Family History  Problem Relation Age of Onset   Alzheimer's disease Mother    Stevens-Johnson syndrome Father    Autism Brother    Colon cancer Other    Colon polyps Neg Hx    Esophageal cancer Neg Hx    Rectal cancer Neg Hx    Stomach cancer Neg Hx     Social History Social History   Tobacco Use   Smoking status: Never   Smokeless tobacco: Never  Vaping Use   Vaping Use: Never used  Substance Use Topics   Alcohol use: Yes    Alcohol/week: 3.0 standard drinks of alcohol    Types: 3 Glasses of  wine per week    Comment: occasional   Drug use: Not Currently    Types: Marijuana     Allergies   Penicillins   Review of Systems Review of Systems  Constitutional:  Positive for activity change. Negative for appetite change, fatigue and fever.  HENT:  Positive for ear pain.   Respiratory:  Negative for cough and shortness of breath.   Cardiovascular:  Negative for chest pain.  Gastrointestinal:  Negative for abdominal pain, diarrhea, nausea and vomiting.  Musculoskeletal:  Negative for arthralgias and myalgias.  Skin:  Positive for color change and wound.  Neurological:  Negative for dizziness, light-headedness and headaches.     Physical Exam Triage Vital Signs ED Triage Vitals [11/28/22 1124]  Enc Vitals Group     BP 132/86     Pulse Rate 81     Resp 18     Temp 98.6 F (37 C)     Temp Source Oral     SpO2 95 %     Weight      Height      Head Circumference      Peak Flow      Pain Score      Pain Loc      Pain Edu?      Excl. in GC?    No data found.  Updated Vital Signs BP 132/86 (BP Location: Left Arm)   Pulse 81   Temp 98.6 F (37 C) (Oral)   Resp 18   SpO2 95%   Visual Acuity Right Eye Distance:   Left Eye Distance:   Bilateral Distance:    Right Eye Near:   Left Eye Near:    Bilateral Near:     Physical Exam Vitals reviewed.  Constitutional:      General: He is awake.     Appearance: Normal appearance. He is well-developed. He is not ill-appearing.     Comments: Very pleasant male appears stated age in no acute distress sitting comfortably in exam room  HENT:     Head: Normocephalic and atraumatic.     Right Ear: Tympanic membrane, ear canal and external ear normal.     Left Ear: Swelling and tenderness present. Tympanic membrane is not erythematous or bulging.     Ears:      Comments: Swelling of external ear with spread anteriorly. Area is mildly tender to palpation.    Nose: Nose normal.     Mouth/Throat:     Pharynx: Uvula  midline. No oropharyngeal exudate or posterior oropharyngeal erythema.  Cardiovascular:     Rate and  Rhythm: Normal rate and regular rhythm.     Heart sounds: Normal heart sounds, S1 normal and S2 normal. No murmur heard. Pulmonary:     Effort: Pulmonary effort is normal.     Breath sounds: Normal breath sounds. No stridor. No wheezing, rhonchi or rales.     Comments: Clear to auscultation bilaterally  Neurological:     Mental Status: He is alert.  Psychiatric:        Behavior: Behavior is cooperative.      UC Treatments / Results  Labs (all labs ordered are listed, but only abnormal results are displayed) Labs Reviewed - No data to display  EKG   Radiology No results found.  Procedures Procedures (including critical care time)  Medications Ordered in UC Medications - No data to display  Initial Impression / Assessment and Plan / UC Course  I have reviewed the triage vital signs and the nursing notes.  Pertinent labs & imaging results that were available during my care of the patient were reviewed by me and considered in my medical decision making (see chart for details).     Patient is well-appearing, afebrile, nontoxic, nontachycardic.  I am concerned for cellulitis triggered by bee sting.  Will cover with Bactrim DS given allergy to penicillin.  He does not have significant swelling of the antihelix so low suspicion for perichondritis but will consider addition of fluoroquinolones if symptoms are not improving quickly.  Given associated swelling will also cover for allergic component.  He was started on H1 and H2 blockade as well as prednisone burst.  Discussed that he is not to take NSAIDs with this medication due to risk of GI bleeding.  Recommended that he follow-up with ENT first thing next week and was given contact information for local provider with instruction to call to schedule an appointment.  If he has any worsening symptoms including increasing swelling,  increasing pain, fever, nausea, vomiting, swelling of his throat he needs to go to the emergency room immediately to which he expressed understanding.  Strict return precautions given.  He declined work excuse note.  Final Clinical Impressions(s) / UC Diagnoses   Final diagnoses:  Cellulitis of left ear  Swelling of left ear  Insect bite of left ear, initial encounter     Discharge Instructions      We are treating you for an allergic reaction of the bee sting as well as possible skin infection.  Please start prednisone 40 mg for 5 days.  Do not take NSAIDs with this medication including aspirin, ibuprofen/Advil, naproxen/Aleve.  Start cetirizine at night and famotidine at night.  Do not take additional antihistamines (allergy medication) with this medication.  Start Bactrim DS twice daily.  If you have any rash or lesions stop the medication.  If your symptoms have not significantly improved by Monday please call to schedule an appoint with ENT.  If anything changes or worsens you have increasing swelling, increasing pain, fever, nausea, vomiting you need to be seen immediately.     ED Prescriptions     Medication Sig Dispense Auth. Provider   cetirizine (ZYRTEC ALLERGY) 10 MG tablet Take 1 tablet (10 mg total) by mouth at bedtime. 30 tablet Jaz Laningham K, PA-C   famotidine (PEPCID) 40 MG tablet Take 1 tablet (40 mg total) by mouth at bedtime. 30 tablet Syrenity Klepacki K, PA-C   predniSONE (DELTASONE) 20 MG tablet Take 2 tablets (40 mg total) by mouth daily for 5 days. 10 tablet Audryanna Zurita, Denny Peon  K, PA-C   sulfamethoxazole-trimethoprim (BACTRIM DS) 800-160 MG tablet Take 1 tablet by mouth 2 (two) times daily for 7 days. 14 tablet Stark Aguinaga, Noberto Retort, PA-C      PDMP not reviewed this encounter.   Jeani Hawking, PA-C 11/28/22 1147    Boni Maclellan, Noberto Retort, PA-C 11/28/22 1151

## 2022-11-28 NOTE — Discharge Instructions (Signed)
We are treating you for an allergic reaction of the bee sting as well as possible skin infection.  Please start prednisone 40 mg for 5 days.  Do not take NSAIDs with this medication including aspirin, ibuprofen/Advil, naproxen/Aleve.  Start cetirizine at night and famotidine at night.  Do not take additional antihistamines (allergy medication) with this medication.  Start Bactrim DS twice daily.  If you have any rash or lesions stop the medication.  If your symptoms have not significantly improved by Monday please call to schedule an appoint with ENT.  If anything changes or worsens you have increasing swelling, increasing pain, fever, nausea, vomiting you need to be seen immediately.

## 2022-11-28 NOTE — ED Triage Notes (Addendum)
Pt reports a bee string to his left ear lobe. Pt reports swelling and pain.

## 2022-12-14 DIAGNOSIS — G4733 Obstructive sleep apnea (adult) (pediatric): Secondary | ICD-10-CM | POA: Diagnosis not present

## 2023-01-12 DIAGNOSIS — G4733 Obstructive sleep apnea (adult) (pediatric): Secondary | ICD-10-CM | POA: Diagnosis not present

## 2023-02-12 DIAGNOSIS — G4733 Obstructive sleep apnea (adult) (pediatric): Secondary | ICD-10-CM | POA: Diagnosis not present

## 2023-02-26 ENCOUNTER — Ambulatory Visit: Payer: Federal, State, Local not specified - PPO | Admitting: Orthopaedic Surgery

## 2023-02-26 ENCOUNTER — Other Ambulatory Visit (INDEPENDENT_AMBULATORY_CARE_PROVIDER_SITE_OTHER): Payer: Federal, State, Local not specified - PPO

## 2023-02-26 VITALS — BP 144/90 | HR 82

## 2023-02-26 DIAGNOSIS — Z96652 Presence of left artificial knee joint: Secondary | ICD-10-CM

## 2023-02-26 DIAGNOSIS — M24562 Contracture, left knee: Secondary | ICD-10-CM | POA: Diagnosis not present

## 2023-03-01 DIAGNOSIS — M24562 Contracture, left knee: Secondary | ICD-10-CM | POA: Insufficient documentation

## 2023-03-01 NOTE — Progress Notes (Signed)
Office Visit Note   Patient: Corey [name]           Date of Birth: Feb 10, 1966           MRN: [id number] Visit Date: 02/26/2023              Requested by: Eldred Manges, MD [address] [place],  [place] [address] PCP: Ronnald Nian, MD   Assessment & Plan: Visit Diagnoses:  1. H/O total knee replacement, left   2. Flexion contracture of knee, left     Plan: Patient does not have full extension we discussed prone positioning with the book bag over his ankle and weight which we had discussed with him in the past 05/21/2021 which she did not do.  He works at the [employer].  We discussed he probably could pick up some more flexion we discussed sitting crossing his ankle pulling back etc.  X-rays show good position alignment of the  left total knee.  We demonstrated 1 over the appropriate positioning to work on getting more extension and he will work on this which should take care of the discomfort and fusion improve his gait.  He can follow-up if having ongoing problems.  Follow-Up Instructions: Return if symptoms worsen or fail to improve.   Orders:  Orders Placed This Encounter  Procedures   XR Knee 1-2 Views Left   No orders of the defined types were placed in this encounter.     Procedures: No procedures performed   Clinical Data: No additional findings.   Subjective: Chief Complaint  Patient presents with   Left Knee - Edema    HPI 57 year old male returns had total knee arthroplasty 11/11/2020) walking with left knee limp and still having some pain and intermittent swelling.  He is concerned he might need an aspiration.  He has 10 degree flexion contracture have full extension of the time of surgery but someplace after therapy lost full extension.  He is able to flex to 110 degrees.  Review of Systems all systems are noncontributory to HPI.   Objective: Vital Signs: BP (!) 144/90   Pulse 82   Physical Exam Constitutional:      Appearance: He is  well-developed.  HENT:     Head: Normocephalic and atraumatic.     Right Ear: External ear normal.     Left Ear: External ear normal.  Eyes:     Pupils: Pupils are equal, round, and reactive to light.  Neck:     Thyroid: No thyromegaly.     Trachea: No tracheal deviation.  Cardiovascular:     Rate and Rhythm: Normal rate.  Pulmonary:     Effort: Pulmonary effort is normal.     Breath sounds: No wheezing.  Abdominal:     General: Bowel sounds are normal.     Palpations: Abdomen is soft.  Musculoskeletal:     Cervical back: Neck supple.  Skin:    General: Skin is warm and dry.     Capillary Refill: Capillary refill takes less than 2 seconds.  Neurological:     Mental Status: He is alert and oriented to person, place, and time.  Psychiatric:        Behavior: Behavior normal.        Thought Content: Thought content normal.        Judgment: Judgment normal.     Ortho Exam patient has 10 degrees to 110 degrees range of motion left knee mild knee swelling.  Quad test fairly strong he has almost normal single step up on the stepstool in the exam room right to left knee.  Collateral ligaments are stable no AP instability of the knee incisions well-healed.  Specialty Comments:  No specialty comments available.  Imaging: tanding AP both knees lateral left knee are obtained and reviewed.  This shows well-positioned left total knee arthroplasty without loosening or subsidence.  Impression: Satisfactory left total knee arthroplasty unchanged from 2022 images.   PMFS History: Patient Active Problem List   Diagnosis Date Noted   Flexion contracture of knee, left 03/01/2023   Prediabetes 11/19/2022   Status post laparoscopic appendectomy 01/14/2021   H/O total knee replacement, left 11/11/2020   Leiomyoma 11/08/2020   Arthritis 09/26/2018   Onychomycosis 09/26/2018   Class 1 obesity due to excess calories without serious comorbidity with body mass index (BMI) of 32.0 to 32.9 in  adult 09/06/2017   Hyperlipidemia LDL goal <100 08/31/2016   OSA on CPAP 08/31/2016   Premature ejaculation 08/19/2011   Past Medical History:  Diagnosis Date   Arthritis    knee, left   ED (erectile dysfunction)    Hyperlipidemia    Hypertension    Sleep apnea    CPAP - not using lately due to recall     Family History  Problem Relation Age of Onset   Alzheimer's disease Mother    Stevens-Johnson syndrome Father    Autism Brother    Colon cancer Other    Colon polyps Neg Hx    Esophageal cancer Neg Hx    Rectal cancer Neg Hx    Stomach cancer Neg Hx     Past Surgical History:  Procedure Laterality Date   ANKLE BONE SURGERY     BACK SURGERY     COLLAR BONE SURGEY     COLONOSCOPY     FRACTURE SURGERY     HERNIA REPAIR     KNEE ARTHROSCOPY Left 2006   LAPAROSCOPIC APPENDECTOMY N/A 01/13/2021   Procedure: APPENDECTOMY LAPAROSCOPIC WITH LYSIS OF ADHESIONS;  Surgeon: Karie Soda, MD;  Location: [facility];  Service: General;  Laterality: N/A;   LUMBAR MICRODISCECTOMY     POLYPECTOMY     SKIN BIOPSY Left 08/11/2021   nodulocystic fat nercrosis   TOTAL KNEE ARTHROPLASTY Left 11/11/2020   Procedure: LEFT TOTAL KNEE ARTHROPLASTY;  Surgeon: Eldred Manges, MD;  Location: [facility];  Service: Orthopedics;  Laterality: Left;   UMBILICAL HERNIA REPAIR     Social History   Occupational History   Occupation: Health and safety inspector  Tobacco Use   Smoking status: Never   Smokeless tobacco: Never  Vaping Use   Vaping status: Never Used  Substance and Sexual Activity   Alcohol use: Yes    Alcohol/week: 3.0 standard drinks of alcohol    Types: 3 Glasses of wine per week    Comment: occasional   Drug use: Not Currently    Types: Marijuana   Sexual activity: Yes

## 2023-03-02 ENCOUNTER — Ambulatory Visit: Payer: Federal, State, Local not specified - PPO | Admitting: Orthopaedic Surgery

## 2023-03-14 DIAGNOSIS — G4733 Obstructive sleep apnea (adult) (pediatric): Secondary | ICD-10-CM | POA: Diagnosis not present

## 2023-04-12 DIAGNOSIS — G4733 Obstructive sleep apnea (adult) (pediatric): Secondary | ICD-10-CM | POA: Diagnosis not present

## 2023-05-12 DIAGNOSIS — G4733 Obstructive sleep apnea (adult) (pediatric): Secondary | ICD-10-CM | POA: Diagnosis not present

## 2023-05-17 DIAGNOSIS — K080 Exfoliation of teeth due to systemic causes: Secondary | ICD-10-CM | POA: Diagnosis not present

## 2023-05-21 ENCOUNTER — Other Ambulatory Visit: Payer: Self-pay | Admitting: Family Medicine

## 2023-05-21 DIAGNOSIS — F524 Premature ejaculation: Secondary | ICD-10-CM

## 2023-05-21 NOTE — Telephone Encounter (Signed)
Last apt 11/19/22

## 2023-06-04 DIAGNOSIS — L200 Besnier's prurigo: Secondary | ICD-10-CM | POA: Diagnosis not present

## 2023-06-12 DIAGNOSIS — G4733 Obstructive sleep apnea (adult) (pediatric): Secondary | ICD-10-CM | POA: Diagnosis not present

## 2023-07-12 DIAGNOSIS — G4733 Obstructive sleep apnea (adult) (pediatric): Secondary | ICD-10-CM | POA: Diagnosis not present

## 2023-07-13 DIAGNOSIS — G4733 Obstructive sleep apnea (adult) (pediatric): Secondary | ICD-10-CM | POA: Diagnosis not present

## 2023-07-27 ENCOUNTER — Encounter: Payer: Self-pay | Admitting: Internal Medicine

## 2023-08-09 DIAGNOSIS — G4733 Obstructive sleep apnea (adult) (pediatric): Secondary | ICD-10-CM | POA: Diagnosis not present

## 2023-09-27 DIAGNOSIS — G4733 Obstructive sleep apnea (adult) (pediatric): Secondary | ICD-10-CM | POA: Diagnosis not present

## 2023-10-11 DIAGNOSIS — G4733 Obstructive sleep apnea (adult) (pediatric): Secondary | ICD-10-CM | POA: Diagnosis not present

## 2023-11-03 ENCOUNTER — Other Ambulatory Visit (INDEPENDENT_AMBULATORY_CARE_PROVIDER_SITE_OTHER): Payer: Self-pay

## 2023-11-03 ENCOUNTER — Ambulatory Visit: Admitting: Physician Assistant

## 2023-11-03 ENCOUNTER — Encounter: Payer: Self-pay | Admitting: Physician Assistant

## 2023-11-03 DIAGNOSIS — G8929 Other chronic pain: Secondary | ICD-10-CM

## 2023-11-03 DIAGNOSIS — M545 Low back pain, unspecified: Secondary | ICD-10-CM

## 2023-11-03 MED ORDER — METHYLPREDNISOLONE 4 MG PO TBPK: ORAL_TABLET | ORAL | 0 refills | Status: DC

## 2023-11-03 NOTE — Progress Notes (Signed)
 Office Visit Note   Patient: Corey [name]           Date of Birth: [birth year]           MRN: [id number] Visit Date: 11/03/2023              Requested by: Watson Hacking, MD [address] [place],  [place] [address] PCP: Watson Hacking, MD  Chief Complaint  Patient presents with   Left Knee - Pain      HPI: Patient is a pleasant 58 year old gentleman who is a patient of Dr. Murrel Arnt.  He has undergone both spine surgery as well as a left total knee replacement in 2022.  He has always had trouble with obtaining good flexion on his knee.  He did do extensive exercise.  He actually said he was okay till about a week and a half ago.  He began noticing more weakness in that left leg.  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  No fever no chills no malaise.  He has a hard time getting out of the chair.  His balance is affected.  He is status post L4-5 fusion with Dr. Murrel Arnt but x-rays look good.  I will place him on a Medrol dose pack.  Like to see him back next week.  Have given him strict return precautions including more loss of balance loss of bowel or bladder control fever chills or increasing pain he understands not to take the steroid with other anti-inflammatories he is a prediabetic at 6.1 I think he will be fine  Assessment & Plan: Visit Diagnoses:  1. Chronic bilateral low back pain, unspecified whether sciatica present    Patient is a pleasant 58 year old gentleman who is a patient of Dr. Murrel Arnt.  He has undergone both spine surgery as well as a left total knee replacement in 2022.  He has always had trouble with obtaining good flexion on his knee.  He did do extensive exercise.  He actually said he was okay till about a week and a half ago.  He began noticing more weakness in that left leg.  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  No fever no chills no malaise.  He has a hard time getting out of the chair.  His balance is affected.  He is status post L4-5 fusion with Dr. Murrel Arnt but x-rays look good.  I will place him on a Medrol dose pack.  Like to see him back next week.  Have given him strict return precautions including more loss of balance loss of bowel or bladder control fever chills or increasing pain he is to present to the emergency room understands not to take the steroid with other anti-inflammatories he is a prediabetic at 6.1 I think he will be fine  Assessment & Plan: Plan:   Follow-Up Instructions: Return in about 1 week (around 11/10/2023).   Ortho Exam  Patient is alert, oriented, no adenopathy, well-dressed, normal affect, normal respiratory effort. Examination of his lower extremities he has a well-healed surgical scar from his left knee no erythema no heat no effusion.  He has flexion to 95 degrees and full extension.  He is neurovascularly intact pulses are intact.  He does have slight  weakness in his right lower extremity with resisted extension and flexion.  Good strength with dorsiflexion and plantarflexion of his ankle on both sides.  Good strength with resisted hip flexor flexion.  No step-offs redness or fluctuance noted about his well-healed spine incision    Imaging: XR Lumbar Spine Complete Result Date: 11/03/2023 2 view radiographs of his lumbar spine overall well-maintained alignment without any listhesis.  Previous fusion of L4-5 hardware appears in good condition.  No evidence of new fracture otherwise well-maintained joint spacing  No images are attached to the encounter.  Labs: Lab Results  Component Value Date   HGBA1C 6.3 (A) 11/19/2022   HGBA1C 6.1 (H) 11/19/2022   HGBA1C 6.2 (H) 11/03/2021     Lab Results  Component Value Date   ALBUMIN  4.5 11/19/2022   ALBUMIN  4.4 11/03/2021   ALBUMIN  3.9  01/12/2021    No results found for: "MG" No results found for: "VD25OH"  No results found for: "PREALBUMIN"    Latest Ref Rng & Units 11/19/2022    2:18 PM 11/03/2021    9:41 AM 01/14/2021    3:34 AM  CBC EXTENDED  WBC 3.4 - 10.8 x10E3/uL 5.1  4.6  13.9   RBC 4.14 - 5.80 x10E6/uL 5.09  5.27  3.70   Hemoglobin 13.0 - 17.7 g/dL 91.4  78.2  95.6   HCT 37.5 - 51.0 % 43.1  44.1  32.7   Platelets 150 - 450 x10E3/uL 291  284  232   NEUT# 1.4 - 7.0 x10E3/uL 2.2  2.3    Lymph# 0.7 - 3.1 x10E3/uL 2.4  1.8       There is no height or weight on file to calculate BMI.  Orders:  Orders Placed This Encounter  Procedures   XR Lumbar Spine Complete   Meds ordered this encounter  Medications   methylPREDNISolone (MEDROL DOSEPAK) 4 MG TBPK tablet    Sig: Take as directed with food    Dispense:  21 tablet    Refill:  0     Procedures: No procedures performed  Clinical Data: No additional findings.  ROS:  All other systems negative, except as noted in the HPI. Review of Systems  Objective: Vital Signs: There were no vitals taken for this visit.  Specialty Comments:  No specialty comments available.  PMFS History: Patient Active Problem List   Diagnosis Date Noted   Flexion contracture of knee, left 03/01/2023   Prediabetes 11/19/2022   Status post laparoscopic appendectomy 01/14/2021   H/O total knee replacement, left 11/11/2020   Leiomyoma 11/08/2020   Arthritis 09/26/2018   Onychomycosis 09/26/2018   Class 1 obesity due to excess calories without serious comorbidity with body mass index (BMI) of 32.0 to 32.9 in adult 09/06/2017   Hyperlipidemia LDL goal <100 08/31/2016   OSA on CPAP 08/31/2016   Premature ejaculation 08/19/2011   Past Medical History:  Diagnosis Date   Arthritis    knee, left   ED (erectile dysfunction)    Hyperlipidemia    Hypertension    Sleep apnea    CPAP - not using lately due to recall     Family History  Problem Relation Age of Onset    Alzheimer's disease Mother    Stevens-Johnson syndrome Father    Autism Brother    Colon cancer Other    Colon polyps Neg Hx    Esophageal cancer Neg Hx    Rectal cancer Neg Hx    Stomach cancer Neg Hx  Past Surgical History:  Procedure Laterality Date   ANKLE BONE SURGERY     BACK SURGERY     COLLAR BONE SURGEY     COLONOSCOPY     FRACTURE SURGERY     HERNIA REPAIR     KNEE ARTHROSCOPY Left 2006   LAPAROSCOPIC APPENDECTOMY N/A 01/13/2021   Procedure: APPENDECTOMY LAPAROSCOPIC WITH LYSIS OF ADHESIONS;  Surgeon: Candyce Champagne, MD;  Location: [facility];  Service: General;  Laterality: N/A;   LUMBAR MICRODISCECTOMY     POLYPECTOMY     SKIN BIOPSY Left 08/11/2021   nodulocystic fat nercrosis   TOTAL KNEE ARTHROPLASTY Left 11/11/2020   Procedure: LEFT TOTAL KNEE ARTHROPLASTY;  Surgeon: Adah Acron, MD;  Location: [facility];  Service: Orthopedics;  Laterality: Left;   UMBILICAL HERNIA REPAIR     Social History   Occupational History   Occupation: Health and safety inspector  Tobacco Use   Smoking status: Never   Smokeless tobacco: Never  Vaping Use   Vaping status: Never Used  Substance and Sexual Activity   Alcohol use: Yes    Alcohol/week: 3.0 standard drinks of alcohol    Types: 3 Glasses of wine per week    Comment: occasional   Drug use: Not Currently    Types: Marijuana   Sexual activity: Yes

## 2023-11-05 ENCOUNTER — Encounter: Admitting: Physician Assistant

## 2023-11-10 ENCOUNTER — Encounter: Payer: Self-pay | Admitting: Physician Assistant

## 2023-11-10 ENCOUNTER — Ambulatory Visit: Admitting: Physician Assistant

## 2023-11-10 DIAGNOSIS — M5416 Radiculopathy, lumbar region: Secondary | ICD-10-CM | POA: Insufficient documentation

## 2023-11-10 NOTE — Addendum Note (Signed)
 Addended by: Georgann Kim on: 11/10/2023 10:00 AM   Modules accepted: Orders

## 2023-11-10 NOTE — Progress Notes (Signed)
 Office Visit Note   Patient: Corey [name]           Date of Birth: Jun 05, 1965           MRN: [id number] Visit Date: 11/10/2023              Requested by: Watson Hacking, MD [address] [place],  [place] [address] PCP: Watson Hacking, MD  Lumbar radiculapathy    HPI: Corey [name] follows up today with his wife.  He is a former patient of Dr. Murrel Arnt.  He is status post L4-5 fusion.  About 3 weeks ago he began having increasing lower extremity pain weakness and paresthesias.  He has fallen several times.  Denies any loss of bowel or bladder control.  I did give him a Medrol  Dosepak he said that did not change his symptoms at all.  Pain and tightness in his lower back radiates down both his legs with associated paresthesias to just below the knee  Assessment & Plan: Visit Diagnoses:  1. Radiculopathy, lumbar region     Plan: I am concerned of his progressive weakness in his lower legs.  He did not get any relief from the Medrol  Dosepak.  It is the weakness that is more concerning than pain.  He does have some muscle tightness and I will give him some Robaxin .  I would like for him to get a stat MRI of his lumbar spine because of his lower extremity weakness and his history of falling.  He should then has referral to follow-up with Dr. Sulema Endo after the MRI as soon as possible.  I have given him strict return precautions should his progression of his symptoms happen.  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  He will submit FMLA paperwork but he should not go back to work until the MRI is completed and appointment is made completed with Dr. Sulema Endo he is in agreement with this plan  Follow-Up Instructions: After MRI with Dr. Davie Essex Exam  Patient is alert, oriented, no adenopathy, well-dressed, normal affect, normal respiratory effort. Examination bilateral lower extremities well-healed surgical incision of his left leg.  Does have some  weakness with extension and flexion dorsiflexion plantarflexion but is able to give some response.  Very difficult transitioning from sitting to standing..  Leg paresthesias left greater than right radiating down his leg to just below the knee compartments of the lower legs are soft and compressible    Imaging: No results found. No images are attached to the encounter.  Labs: Lab Results  Component Value Date   HGBA1C 6.3 (A) 11/19/2022   HGBA1C 6.1 (H) 11/19/2022   HGBA1C 6.2 (H) 11/03/2021     Lab Results  Component Value Date   ALBUMIN  4.5 11/19/2022   ALBUMIN  4.4 11/03/2021   ALBUMIN  3.9 01/12/2021    No results found for: MG No results found for: VD25OH  No results found for: PREALBUMIN    Latest Ref Rng & Units 11/19/2022    2:18 PM 11/03/2021    9:41 AM 01/14/2021    3:34 AM  CBC EXTENDED  WBC 3.4 - 10.8 x10E3/uL 5.1  4.6  13.9   RBC 4.14 - 5.80 x10E6/uL 5.09  5.27  3.70   Hemoglobin 13.0 - 17.7 g/dL 16.6  06.3  01.6   HCT 37.5 - 51.0 % 43.1  44.1  32.7   Platelets 150 - 450 x10E3/uL 291  284  232  NEUT# 1.4 - 7.0 x10E3/uL 2.2  2.3    Lymph# 0.7 - 3.1 x10E3/uL 2.4  1.8       There is no height or weight on file to calculate BMI.  Orders:  No orders of the defined types were placed in this encounter.  No orders of the defined types were placed in this encounter.    Procedures: No procedures performed  Clinical Data: No additional findings.  ROS:  All other systems negative, except as noted in the HPI. Review of Systems  Objective: Vital Signs: There were no vitals taken for this visit.  Specialty Comments:  No specialty comments available.  PMFS History: Patient Active Problem List   Diagnosis Date Noted   Radiculopathy, lumbar region 11/10/2023   Flexion contracture of knee, left 03/01/2023   Prediabetes 11/19/2022   Status post laparoscopic appendectomy 01/14/2021   H/O total knee replacement, left 11/11/2020   Leiomyoma  11/08/2020   Arthritis 09/26/2018   Onychomycosis 09/26/2018   Class 1 obesity due to excess calories without serious comorbidity with body mass index (BMI) of 32.0 to 32.9 in adult 09/06/2017   Hyperlipidemia LDL goal <100 08/31/2016   OSA on CPAP 08/31/2016   Premature ejaculation 08/19/2011   Past Medical History:  Diagnosis Date   Arthritis    knee, left   ED (erectile dysfunction)    Hyperlipidemia    Hypertension    Sleep apnea    CPAP - not using lately due to recall     Family History  Problem Relation Age of Onset   Alzheimer's disease Mother    Stevens-Johnson syndrome Father    Autism Brother    Colon cancer Other    Colon polyps Neg Hx    Esophageal cancer Neg Hx    Rectal cancer Neg Hx    Stomach cancer Neg Hx     Past Surgical History:  Procedure Laterality Date   ANKLE BONE SURGERY     BACK SURGERY     COLLAR BONE SURGEY     COLONOSCOPY     FRACTURE SURGERY     HERNIA REPAIR     KNEE ARTHROSCOPY Left 2006   LAPAROSCOPIC APPENDECTOMY N/A 01/13/2021   Procedure: APPENDECTOMY LAPAROSCOPIC WITH LYSIS OF ADHESIONS;  Surgeon: Candyce Champagne, MD;  Location: [facility];  Service: General;  Laterality: N/A;   LUMBAR MICRODISCECTOMY     POLYPECTOMY     SKIN BIOPSY Left 08/11/2021   nodulocystic fat nercrosis   TOTAL KNEE ARTHROPLASTY Left 11/11/2020   Procedure: LEFT TOTAL KNEE ARTHROPLASTY;  Surgeon: Adah Acron, MD;  Location: [facility];  Service: Orthopedics;  Laterality: Left;   UMBILICAL HERNIA REPAIR     Social History   Occupational History   Occupation: Health and safety inspector  Tobacco Use   Smoking status: Never   Smokeless tobacco: Never  Vaping Use   Vaping status: Never Used  Substance and Sexual Activity   Alcohol use: Yes    Alcohol/week: 3.0 standard drinks of alcohol    Types: 3 Glasses of wine per week    Comment: occasional   Drug use: Not Currently    Types: Marijuana   Sexual activity: Yes

## 2023-11-11 ENCOUNTER — Other Ambulatory Visit: Payer: Self-pay | Admitting: Family Medicine

## 2023-11-11 ENCOUNTER — Telehealth: Payer: Self-pay | Admitting: Physician Assistant

## 2023-11-11 ENCOUNTER — Ambulatory Visit
Admission: RE | Admit: 2023-11-11 | Discharge: 2023-11-11 | Disposition: A | Discharge status: Home / Self Care | Source: Ambulatory Visit | Attending: Physician Assistant | Admitting: Physician Assistant

## 2023-11-11 ENCOUNTER — Other Ambulatory Visit: Payer: Self-pay | Admitting: Physician Assistant

## 2023-11-11 DIAGNOSIS — M48061 Spinal stenosis, lumbar region without neurogenic claudication: Secondary | ICD-10-CM | POA: Diagnosis not present

## 2023-11-11 DIAGNOSIS — Z981 Arthrodesis status: Secondary | ICD-10-CM | POA: Diagnosis not present

## 2023-11-11 DIAGNOSIS — M5416 Radiculopathy, lumbar region: Secondary | ICD-10-CM

## 2023-11-11 DIAGNOSIS — F524 Premature ejaculation: Secondary | ICD-10-CM

## 2023-11-11 DIAGNOSIS — G4733 Obstructive sleep apnea (adult) (pediatric): Secondary | ICD-10-CM | POA: Diagnosis not present

## 2023-11-11 MED ORDER — METHOCARBAMOL 500 MG PO TABS: 500.0000 mg | ORAL_TABLET | Freq: Four times a day (QID) | ORAL | 0 refills | Status: DC | PRN

## 2023-11-11 NOTE — Telephone Encounter (Signed)
 Pt states the Robaxin  was not sent to the pharmacy   [facility]

## 2023-11-15 ENCOUNTER — Ambulatory Visit: Payer: Self-pay | Admitting: Radiology

## 2023-11-22 ENCOUNTER — Ambulatory Visit: Admitting: Orthopedic Surgery

## 2023-11-22 ENCOUNTER — Other Ambulatory Visit (INDEPENDENT_AMBULATORY_CARE_PROVIDER_SITE_OTHER): Payer: Self-pay

## 2023-11-22 VITALS — BP 125/86 | HR 94 | Ht 69.0 in | Wt 243.2 lb

## 2023-11-22 DIAGNOSIS — M545 Low back pain, unspecified: Secondary | ICD-10-CM

## 2023-11-22 DIAGNOSIS — M4714 Other spondylosis with myelopathy, thoracic region: Secondary | ICD-10-CM

## 2023-11-22 DIAGNOSIS — M5416 Radiculopathy, lumbar region: Secondary | ICD-10-CM | POA: Diagnosis not present

## 2023-11-22 NOTE — Progress Notes (Signed)
 Orthopedic Spine Surgery Office Note  Assessment: Patient is a 58 y.o. male with acute onset of unsteadiness, imbalance, numbness and paresthesias in his legs.  Possible thoracic myelopathy   Plan: -Patient does have lumbar stenosis at L4/5 above his prior fusion, but has symptoms that would be atypical for lumbar stenosis.  He is having significant unsteadiness and has had to start using a walker.  Has had several falls.  He also has hyperreflexia on exam.  His upper extremities do not show hyperreflexia and has had no issues with fine motor skills in the hands.  For that reason, recommended an MRI of the thoracic spine to rule out thoracic myelopathy as that could explain his unsteadiness and some of his physical exam findings - For his lumbar spine, recommended a L4/5 ESI for diagnostic and therapeutic purposes -Patient should return to office in 3 weeks, x-rays at next visit: none   Patient expressed understanding of the plan and all questions were answered to the patient's satisfaction.   ___________________________________________________________________________   History:  Patient is a 58 y.o. male who presents today for lumbar spine.  Patient has had about 4 weeks of tightness, numbness, paresthesias in his bilateral legs.  He feels it in the anterior aspect and posterior aspect of the thighs.  He also feels it in the anterior aspect of his legs.  He has noticed significant unsteadiness and balance.  He is had several falls.  He has started to use a walker to help with his imbalance.  There is no trauma or injury that preceded the onset of the symptoms.   Weakness: Yes, feels his legs are weaker.  No other weakness noted Symptoms of imbalance: Yes, has had unsteadiness and imbalance.  Has resulted in several falls.  Is using a walker due to his unsteadiness Paresthesias and numbness: Yes, gets numbness and paresthesias on the anterior and posterior aspects of the thighs.  Also has it  in the anterior legs. Bowel or bladder incontinence: Denies Saddle anesthesia: Denies  Treatments tried: PT, oral steroids, muscle relaxers  Review of systems: Denies fevers and chills, night sweats, unexplained weight loss, history of cancer, pain that wakes him at night  Past medical history: OSA HTN HLD ED  Allergies: penicillin  Past surgical history:  L5/S1 TLIF and PSIF Clavicle fracture ORIF Appendectomy Polypectomy Left TKA Hernia repair  Social history: Denies use of nicotine product (smoking, vaping, patches, smokeless) Alcohol use: Yes, approximately 3 drinks per week Denies recreational drug use   Physical Exam:  BMI of 35.9  General: no acute distress, appears stated age Neurologic: alert, answering questions appropriately, following commands Respiratory: unlabored breathing on room air, symmetric chest rise Psychiatric: appropriate affect, normal cadence to speech   MSK (spine):  -Strength exam      Left  Right EHL    5/5  5/5 TA    5/5  5/5 GSC    5/5  5/5 Knee extension  5/5  5/5 Hip flexion   5/5  5/5  -Sensory exam    Sensation intact to light touch in L3-S1 nerve distributions of bilateral lower extremities  -Achilles DTR: 2/4 on the left, 2/4 on the right -Patellar tendon DTR: 3/4 on the left, 3/4 on the right -Biceps DTR: 2/4 on the left, 2/4 on the right -Brachioradialis DTR: 2/4 on the left, 2/4 on the right -Triceps DTR: 2/4 on the left, 2/4 on the right  -Negative hoffman bilaterally -Positive Romberg -Negative grip and release test -Wide based  gait, uses walker -No beats of clonus bilaterally -No interosseous muscle wasting seen  -Straight leg raise: negative bilaterally -Clonus: no beats bilaterally  -Left hip exam: no pain through range of motion -Right hip exam: no pain through range of motion  Imaging: XRs of the lumbar spine from 11/22/2023 were independently reviewed and interpreted, showing posterior  instrumentation at L5 and S1. No lucency seen around the screws.  Interbody device in the former L5/S1 disc space that appears appropriate position. Anterior osteophyte formation seen at L4/5.  No fracture or dislocation seen.  MRI of the lumbar spine from 11/11/2023 was independently reviewed and interpreted, showing DDD at L4/5. Central and lateral recess stenosis at L4/5. Right sided foraminal stenosis at L4/5. No other significant stenosis seen.    Patient name: Corey [name] Patient MRN: [id number] Date of visit: 11/22/23

## 2023-11-23 ENCOUNTER — Telehealth: Payer: Self-pay | Admitting: Orthopedic Surgery

## 2023-11-23 ENCOUNTER — Ambulatory Visit: Payer: Federal, State, Local not specified - PPO | Admitting: Family Medicine

## 2023-11-23 VITALS — BP 132/90 | HR 101 | Ht 68.0 in | Wt 238.0 lb

## 2023-11-23 DIAGNOSIS — M4804 Spinal stenosis, thoracic region: Secondary | ICD-10-CM | POA: Diagnosis not present

## 2023-11-23 DIAGNOSIS — M1990 Unspecified osteoarthritis, unspecified site: Secondary | ICD-10-CM

## 2023-11-23 DIAGNOSIS — M5416 Radiculopathy, lumbar region: Secondary | ICD-10-CM

## 2023-11-23 DIAGNOSIS — R7303 Prediabetes: Secondary | ICD-10-CM

## 2023-11-23 DIAGNOSIS — G4733 Obstructive sleep apnea (adult) (pediatric): Secondary | ICD-10-CM | POA: Diagnosis not present

## 2023-11-23 DIAGNOSIS — Z2821 Immunization not carried out because of patient refusal: Secondary | ICD-10-CM

## 2023-11-23 DIAGNOSIS — Z6832 Body mass index (BMI) 32.0-32.9, adult: Secondary | ICD-10-CM

## 2023-11-23 DIAGNOSIS — Z96652 Presence of left artificial knee joint: Secondary | ICD-10-CM

## 2023-11-23 DIAGNOSIS — E66811 Obesity, class 1: Secondary | ICD-10-CM

## 2023-11-23 DIAGNOSIS — E785 Hyperlipidemia, unspecified: Secondary | ICD-10-CM

## 2023-11-23 DIAGNOSIS — E6609 Other obesity due to excess calories: Secondary | ICD-10-CM

## 2023-11-23 DIAGNOSIS — Z0000 Encounter for general adult medical examination without abnormal findings: Secondary | ICD-10-CM

## 2023-11-23 DIAGNOSIS — M4714 Other spondylosis with myelopathy, thoracic region: Secondary | ICD-10-CM | POA: Diagnosis not present

## 2023-11-23 LAB — POCT GLYCOSYLATED HEMOGLOBIN (HGB A1C): Hemoglobin A1C: 6.2 % — AB (ref 4.0–5.6)

## 2023-11-23 NOTE — Telephone Encounter (Signed)
 Pt's wife called requesting a call back. She is asking should pt not drive. She states she is scared for him to drive with all that is going on with him and his back and afraid it might lock up at any time and barely can walk. Please call [name] at [phone].

## 2023-11-23 NOTE — Progress Notes (Signed)
 Complete physical exam  Patient: Corey [name]   DOB: 01/12/1966   58 y.o. Male  MRN: [id number]  Subjective:    Chief Complaint  Patient presents with   Annual Exam    Cpe. Fasting. No other concerns    TALON REGALA is a 58 y.o. male who presents today for a complete physical exam.  He reports consuming a general diet. The patient does not participate in regular exercise at present. He generally feels well. He reports sleeping well.  Over the last month he has noted increased difficulty with stiffness in his back especially in his upper thoracic area.  He does have evidence of lumbar stenosis and was seen recently by orthopedics.  At that time he was found to have hyperreflexia however this was just in his lower extremities upper extremities were spared.  He has been out on disability and presently is using a walker to get around because of instability and apparently has had recent falls.  He also has a history of prediabetes.  He is on CPAP for his OSA which is going well.  He has not had any difficulty with his left knee replacement.  He was recently married.  Most recent fall risk assessment:    11/23/2023    2:35 PM  Fall Risk   Falls in the past year? 1  Number falls in past yr: 1  Injury with Fall? 0  Risk for fall due to : No Fall Risks  Follow up Falls evaluation completed     Most recent depression screenings:    11/23/2023    2:36 PM 11/19/2022    1:31 PM  PHQ 2/9 Scores  PHQ - 2 Score 1 0    Vision:Within last year and Dental: No current dental problems and Receives regular dental care    Immunization History  Administered Date(s) Administered   Janssen (J&J) SARS-COV-2 Vaccination 09/11/2019, 05/14/2020   Moderna Covid-19 Vaccine Bivalent Booster 24yrs & up 04/21/2021   Tdap 06/04/2010, 10/07/2020    Health Maintenance  Topic Date Due   Hepatitis B Vaccines (1 of 3 - 19+ 3-dose series) Never done   Zoster Vaccines- Shingrix (1 of 2) Never done    COVID-19 Vaccine (4 - 2024-25 season) 01/31/2023   INFLUENZA VACCINE  12/31/2023   Colonoscopy  08/06/2030   DTaP/Tdap/Td (3 - Td or Tdap) 10/08/2030   Hepatitis C Screening  Completed   HIV Screening  Completed   HPV VACCINES  Aged Out   Meningococcal B Vaccine  Aged Out    Patient Care Team: Joyce Norleen BROCKS, MD as PCP - General (Family Medicine) Sheldon Standing, MD as Consulting Physician (General Surgery)   Outpatient Medications Prior to Visit  Medication Sig   methocarbamol  (ROBAXIN ) 500 MG tablet Take 1 tablet (500 mg total) by mouth every 6 (six) hours as needed for muscle spasms.   Multiple Vitamins-Minerals (HAIR SKIN AND NAILS FORMULA PO) Take by mouth.   Multiple Vitamins-Minerals (MULTIVITAMIN WITH MINERALS) tablet Take 2 tablets by mouth daily.   PARoxetine  (PAXIL ) 20 MG tablet TAKE 1 TABLET BY MOUTH EVERY DAY IN THE MORNING   cetirizine  (ZYRTEC  ALLERGY) 10 MG tablet Take 1 tablet (10 mg total) by mouth at bedtime.   famotidine  (PEPCID ) 40 MG tablet Take 1 tablet (40 mg total) by mouth at bedtime.   methylPREDNISolone  (MEDROL  DOSEPAK) 4 MG TBPK tablet Take as directed with food   No facility-administered medications prior to visit.    ROS  Family and social history as well as health maintenance and immunizations was reviewed.   Immunizations were discussed but he refused. Objective:      Physical Exam  Alert and in no distress. Tympanic membranes and canals are normal. Pharyngeal area is normal. Neck is supple without adenopathy or thyromegaly. Cardiac exam shows a regular sinus rhythm without murmurs or gallops. Lungs are clear to auscultation. Lower extremity exam does show 3-4+ hyperreflexia as well as 2-3 beat clonus.  Pulses were difficult to feel but could tell with Doppler but they were present.  Normal strength and reflexes noted in his arms.  Normal pulses. Hemoglobin A1c is 6.2.     Assessment & Plan:    Routine general medical examination at a health  care facility  Radiculopathy, lumbar region  Prediabetes - Plan: POCT glycosylated hemoglobin (Hb A1C)  OSA on CPAP  Hyperlipidemia LDL goal <100 - Plan: Lipid panel  H/O total knee replacement, left  Class 1 obesity due to excess calories without serious comorbidity with body mass index (BMI) of 32.0 to 32.9 in adult - Plan: CBC with Differential/Platelet, Comprehensive metabolic panel with GFR, Lipid panel  Arthritis  Immunization refused  I explained that the main issue we are dealing with now is his back and the MRI of his back is critical.  Instructed him to call me when he finishes the MRI which would be on the 30th.  I explained that I will expedite getting the thing read as I am very concerned about the abnormal reflexes that he is having. Return in about 1 year (around 11/22/2024).   6/26 8:17 I received a call from radiology concerning the MRI report showing a T3 stenosis.  I called the patient to inform him of this and ask about bowel and bladder issues and he said he had none.  Urgent referral was placed and I will call in the morning to expedite the process.   Norleen Jobs, MD

## 2023-11-24 ENCOUNTER — Ambulatory Visit: Payer: Self-pay | Admitting: Family Medicine

## 2023-11-24 LAB — CBC WITH DIFFERENTIAL/PLATELET
Basophils Absolute: 0 10*3/uL (ref 0.0–0.2)
Basos: 1 %
EOS (ABSOLUTE): 0.1 10*3/uL (ref 0.0–0.4)
Eos: 2 %
Hematocrit: 44.8 % (ref 37.5–51.0)
Hemoglobin: 14.1 g/dL (ref 13.0–17.7)
Immature Grans (Abs): 0 10*3/uL (ref 0.0–0.1)
Immature Granulocytes: 0 %
Lymphocytes Absolute: 2.1 10*3/uL (ref 0.7–3.1)
Lymphs: 38 %
MCH: 27.5 pg (ref 26.6–33.0)
MCHC: 31.5 g/dL (ref 31.5–35.7)
MCV: 87 fL (ref 79–97)
Monocytes Absolute: 0.3 10*3/uL (ref 0.1–0.9)
Monocytes: 6 %
Neutrophils Absolute: 3 10*3/uL (ref 1.4–7.0)
Neutrophils: 53 %
Platelets: 308 10*3/uL (ref 150–450)
RBC: 5.13 x10E6/uL (ref 4.14–5.80)
RDW: 13.8 % (ref 11.6–15.4)
WBC: 5.5 10*3/uL (ref 3.4–10.8)

## 2023-11-24 LAB — LIPID PANEL
Chol/HDL Ratio: 4.9 ratio (ref 0.0–5.0)
Cholesterol, Total: 216 mg/dL — ABNORMAL HIGH (ref 100–199)
HDL: 44 mg/dL (ref >39)
LDL Chol Calc (NIH): 148 mg/dL — ABNORMAL HIGH (ref 0–99)
Triglycerides: 133 mg/dL (ref 0–149)
VLDL Cholesterol Cal: 24 mg/dL (ref 5–40)

## 2023-11-24 LAB — COMPREHENSIVE METABOLIC PANEL WITH GFR
ALT: 32 IU/L (ref 0–44)
AST: 19 IU/L (ref 0–40)
Albumin: 4.4 g/dL (ref 3.8–4.9)
Alkaline Phosphatase: 79 IU/L (ref 44–121)
BUN/Creatinine Ratio: 10 (ref 9–20)
BUN: 12 mg/dL (ref 6–24)
Bilirubin Total: 0.4 mg/dL (ref 0.0–1.2)
CO2: 23 mmol/L (ref 20–29)
Calcium: 9.5 mg/dL (ref 8.7–10.2)
Chloride: 102 mmol/L (ref 96–106)
Creatinine, Ser: 1.25 mg/dL (ref 0.76–1.27)
Globulin, Total: 2.7 g/dL (ref 1.5–4.5)
Glucose: 83 mg/dL (ref 70–99)
Potassium: 4.1 mmol/L (ref 3.5–5.2)
Sodium: 142 mmol/L (ref 134–144)
Total Protein: 7.1 g/dL (ref 6.0–8.5)
eGFR: 68 mL/min/{1.73_m2} (ref >59)

## 2023-11-24 NOTE — Addendum Note (Signed)
 Addended by: LATTIE ERNST C on: 11/24/2023 02:00 PM   Modules accepted: Orders

## 2023-11-25 ENCOUNTER — Telehealth: Payer: Self-pay

## 2023-11-25 ENCOUNTER — Other Ambulatory Visit

## 2023-11-25 ENCOUNTER — Ambulatory Visit
Admission: RE | Admit: 2023-11-25 | Discharge: 2023-11-25 | Disposition: A | Discharge status: Home / Self Care | Source: Ambulatory Visit | Attending: Family Medicine | Admitting: Family Medicine

## 2023-11-25 DIAGNOSIS — M4714 Other spondylosis with myelopathy, thoracic region: Secondary | ICD-10-CM

## 2023-11-25 DIAGNOSIS — Z0000 Encounter for general adult medical examination without abnormal findings: Secondary | ICD-10-CM

## 2023-11-25 NOTE — Addendum Note (Signed)
 Addended by: JOYCE NORLEEN BROCKS on: 11/25/2023 09:22 PM   Modules accepted: Orders

## 2023-11-25 NOTE — Telephone Encounter (Signed)
 Copied from CRM [id number]. Topic: General - Other >> Nov 25, 2023  4:26 PM Powell HERO wrote: Reason for CRM: Patient calling to inform Dr Joyce that he has completed his imaging.

## 2023-11-26 ENCOUNTER — Telehealth: Payer: Self-pay | Admitting: Orthopedic Surgery

## 2023-11-26 NOTE — Telephone Encounter (Addendum)
 Orthopedic Telephone Call  Spoke with patient this afternoon on the telephone.  I went over his MRI with him.  I explained that his MRI does show significant spinal cord compression which is what I was using it to evaluate for.  He has T2 cord signal change behind the T2/3 disc space where there is a disc herniation resulting in significant stenosis.  He also has central stenosis at T3/4 and subtle T2 cord signal change there as well.  Given his physical exam being consistent with myelopathy, I discussed surgical intervention for this problem.  I explained that there is no nonoperative treatment that has been shown to be helpful for myelopathy.  I also went over the natural history of myelopathy which involved stepwise decline over time.  For that reason, I will work to get him on the schedule at the next available date on my schedule where I have enough time to do this type of surgery.  I discussed a T3 transpedicular decompression to address the T2/3 disc herniation along with a T3/4 laminectomy to address the stenosis there.  Due to the destabilizing nature of a transpedicular decompression, I discussed the C7-T4 instrumented spinal fusion.  I covered the risks of surgery including but not limited to: Infection, dural tear, DVT/PE, spinal cord injury, hardware failure/malposition, adjacent segment disease, need for additional procedures, blood loss, blindness, heart attack, and death.  After our conversation about the risk, benefits, alternatives of the surgery, patient like to proceed. I answered all of his questions to his satisfaction. Patient will be scheduled him at the next available OR date with sufficient time to handle his case.   Corey DELENA Ada, MD Orthopedic Surgeon

## 2023-11-26 NOTE — Telephone Encounter (Signed)
 Patient's wife called. Has some questions about his upcoming surgery. [phone] is her number. Princella is her name

## 2023-11-26 NOTE — Discharge Instructions (Signed)

## 2023-11-29 ENCOUNTER — Inpatient Hospital Stay
Admission: RE | Admit: 2023-11-29 | Discharge: 2023-11-29 | Disposition: A | Discharge status: Home / Self Care | Source: Ambulatory Visit | Attending: Orthopedic Surgery | Admitting: Orthopedic Surgery

## 2023-12-02 NOTE — Progress Notes (Signed)
 Referring Physician:  Joyce Norleen BROCKS, MD [address] [place],  [place] [address]  Primary Physician:  Joyce Norleen BROCKS, MD  History of Present Illness: 12/06/2023 [name] is here today with a chief complaint of thoracic myelopathy.  He is here today for second opinion.  He has progressive sensation loss in his bilateral lower extremities that started in his feet and have now progressed up to his mid abdomen.  States that he has been having progressive loss of ambulatory function during this time as well.  He has been falling numerous times at home and his wife feels like he is no longer safe at home.  She brought him into clinic today for evaluation.  He states that his legs have become progressively weak and he is no longer able to lift his left leg well.  States that he will often fall or not be able to control his lower extremities.  He is not yet having bowel or bladder dysfunction.  His wife noticed that sometimes when he moves his lower extremities they will start rapidly shaking.  She feels like he is drastically worsened every week over the last 3 weeks and she is worried about his safety at home. He is unable to ambulate without a walker and even with the walker he is having multiple falls.  He is not having any upper extremity symptoms.  Past Surgery:  Microdiscectomy L5-S1 TLIF  The symptoms are causing a significant impact on the patient's life.   I have utilized the care everywhere function in epic to review the outside records available from external health systems.  Review of Systems:  A 10 point review of systems is negative, except for the pertinent positives and negatives detailed in the HPI.  Past Medical History: Past Medical History:  Diagnosis Date   Arthritis    knee, left   ED (erectile dysfunction)    Hyperlipidemia    Hypertension    Sleep apnea    CPAP - not using lately due to recall     Past Surgical History: Past Surgical History:   Procedure Laterality Date   ANKLE BONE SURGERY     BACK SURGERY     COLLAR BONE SURGEY     COLONOSCOPY     FRACTURE SURGERY     HERNIA REPAIR     KNEE ARTHROSCOPY Left 2006   LAPAROSCOPIC APPENDECTOMY N/A 01/13/2021   Procedure: APPENDECTOMY LAPAROSCOPIC WITH LYSIS OF ADHESIONS;  Surgeon: Sheldon Standing, MD;  Location: [facility];  Service: General;  Laterality: N/A;   LUMBAR MICRODISCECTOMY     POLYPECTOMY     SKIN BIOPSY Left 08/11/2021   nodulocystic fat nercrosis   TOTAL KNEE ARTHROPLASTY Left 11/11/2020   Procedure: LEFT TOTAL KNEE ARTHROPLASTY;  Surgeon: Barbarann Oneil BROCKS, MD;  Location: [facility];  Service: Orthopedics;  Laterality: Left;   UMBILICAL HERNIA REPAIR      Allergies: Allergies as of 12/06/2023 - Review Complete 12/06/2023  Allergen Reaction Noted   Penicillins Other (See Comments) 01/19/2011    Medications:  Current Outpatient Medications:    methocarbamol  (ROBAXIN ) 500 MG tablet, Take 1 tablet (500 mg total) by mouth every 6 (six) hours as needed for muscle spasms., Disp: 30 tablet, Rfl: 0   Multiple Vitamins-Minerals (HAIR SKIN AND NAILS FORMULA PO), Take by mouth., Disp: , Rfl:    Multiple Vitamins-Minerals (MULTIVITAMIN WITH MINERALS) tablet, Take 2 tablets by mouth daily., Disp: , Rfl:    PARoxetine  (PAXIL ) 20 MG tablet,  TAKE 1 TABLET BY MOUTH EVERY DAY IN THE MORNING, Disp: 90 tablet, Rfl: 1  Social History: Social History   Tobacco Use   Smoking status: Never   Smokeless tobacco: Never  Vaping Use   Vaping status: Never Used  Substance Use Topics   Alcohol use: Yes    Alcohol/week: 3.0 standard drinks of alcohol    Types: 3 Glasses of wine per week    Comment: occasional   Drug use: Not Currently    Types: Marijuana    Family Medical History: Family History  Problem Relation Age of Onset   Alzheimer's disease Mother    Stevens-Johnson syndrome Father    Autism Brother    Colon cancer Other    Colon polyps Neg Hx    Esophageal cancer Neg Hx     Rectal cancer Neg Hx    Stomach cancer Neg Hx     Physical Examination: Vitals:   12/06/23 1459  BP: 136/86    General: Patient is in no apparent distress. Attention to examination is appropriate.  Neck:   Supple.  Full range of motion.  Respiratory: Patient is breathing without any difficulty.   NEUROLOGICAL:     Awake, alert, oriented to person, place, and time.  Speech is clear and fluent.   Cranial Nerves: Pupils equal round and reactive to light.  Facial tone is symmetric.  Facial sensation is symmetric. Shoulder shrug is symmetric. Tongue protrusion is midline.    Strength:  Side Iliopsoas Quads Hamstring PF DF   R 2 4- 5 4 3    L 4 4+ 5 4+ 4    He is hyperreflexic at the bilateral patella, medial hamstrings, and Achilles.  He has sustained clonus in the left lower extremity and 7 beats of clonus in the right lower extremity.  He has upgoing toes.  He has crossed adductor reflexes.  He has decree sensation is most dense distally, and starts to get some sensation back mid thigh but has sensation aberrations all the way up to his abdomen.  Even with his 3 wheeled walker he is quite unsteady with his feet.  He has trouble placing his left leg specifically, his right leg seems to have better control at this time.  But even while walking with his walker he would get hung up.  Seems to be quite spastic.  Imaging: arrative & Impression  CLINICAL DATA:  Initial evaluation for acute myelopathy.   EXAM: MRI THORACIC SPINE WITHOUT CONTRAST   TECHNIQUE: Multiplanar, multisequence MR imaging of the thoracic spine was performed. No intravenous contrast was administered.   COMPARISON:  None Available.   FINDINGS: Alignment: Trace scoliosis. Alignment otherwise normal preservation of the normal thoracic kyphosis. No listhesis.   Vertebrae: Vertebral body height maintained without acute or chronic fracture. Bone marrow signal intensity overall within normal limits. No  worrisome osseous lesions. No abnormal marrow edema.   Cord: Patchy cord signal abnormality seen at the level of T2-3, concerning for edema and/or myelomalacia (series 106, image 10). Otherwise normal signal and morphology.   Paraspinal and other soft tissues: Unremarkable.   Disc levels:   T1-2: Diffuse disc bulge with reactive endplate spurring. Superimposed left paracentral disc protrusion (series 108, image 5). Left-sided facet hypertrophy. Resultant mild spinal stenosis with mild cord flattening, but no cord signal changes. Foramina appear patent.   T2-3: Diffuse disc bulge with reactive endplate spurring. Moderate bilateral facet hypertrophy. Resultant severe spinal stenosis. Secondary cord flattening/compression with associated cord signal changes (series 108,  image 9). Moderate bilateral foraminal narrowing.   T3-4: Diffuse disc bulge with reactive endplate spurring. Mild facet hypertrophy. Resultant mild spinal stenosis. Moderate bilateral foraminal narrowing.   T4-5: Disc bulge with endplate spurring. Mild facet hypertrophy. No significant spinal stenosis. Mild right foraminal narrowing. Left neural foramina appears patent.   T5-6: Mild disc bulge with endplate spurring. Right-sided facet hypertrophy. No significant stenosis.   T6-7: Mild disc bulge, asymmetric to the left. Mild facet hypertrophy. No significant canal or foraminal stenosis.   T7-8: Mild disc bulge. Right-sided facet hypertrophy. No significant stenosis.   T8-9: Minimal disc bulge. Mild right-sided facet hypertrophy. No significant stenosis.   T9-10: Mild diffuse disc bulge superimposed small right paracentral disc protrusion (series 108, image 31). Mild right-sided facet hypertrophy. No significant spinal stenosis. Foramina appear patent.   T10-11: Diffuse disc bulge with reactive endplate spurring. Right worse than left facet hypertrophy. No significant spinal stenosis. Moderate right  foraminal narrowing. Left neural foramina remains patent.   T11-12: Disc bulge with endplate spurring. Mild facet hypertrophy. No significant spinal stenosis. Severe right foraminal narrowing. Left neural foramina remains patent.   T12-L1:  Negative interspace.  Mild facet hypertrophy.  No stenosis.   IMPRESSION: 1. Multilevel thoracic spondylosis with resultant diffuse spinal stenosis at T1-2 through T3-4, severe at the T2-3 level. Secondary cord flattening/compression at T2-3 with associated cord signal changes, consistent with edema and/or myelomalacia. 2. Multifactorial degenerative changes with resultant multilevel foraminal narrowing as above. Notable findings include moderate bilateral foraminal narrowing at T2-3 and T3-4, with severe right foraminal narrowing at T11-12.   Current attempt is being made to contact the ordering clinician. Results will be conveyed as soon as possible.   Electronically Signed: By: Morene Hoard M.D. On: 11/25/2023 20:09   I have personally reviewed the images and agree with the above interpretation.  Medical Decision Making/Assessment and Plan: Mr. Siwek is a pleasant 58 y.o. male with rapidly progressive thoracic myelopathy.  Over the past 3 weeks he has had progressive difficulty with walking and controlling his lower extremities.  His wife is noticed that his legs have started rhythmically jerking when he tries to put them into certain positions.  She now has to help him get his legs in the bed.  He is having difficulty walking even with a 3 wheeled walker.  He is unable to lift his left leg anymore.  On formal examination he has severe deficits in the bilateral lower extremities left worse than right.  He is not antigravity in the left hip or ankle anymore at this time.  His last evaluation less than a month ago he was reported as having full strength with some mild hyperreflexia.  Now on physical examination he shows pathologic reflexes at  the bilateral patella, Achilles, and medial hamstrings.  He also has crossed adductor reflexes.  He has sustained clonus in the left lower extremity with an upgoing toe, and 7 beats of clonus on the right lower extremity with an upgoing toe.  His MRI was evaluated shows a severe stenosis at T2-3 with T2 signal change and cord flattening secondary to disc herniation.  He also has some more mild stenosis paracentrally at T1-2 and T3-4.  At this time given his rapid decline as well as progressive myelopathy coming from the thoracic disc herniation causing severe cord compression with T2 signal abnormality we have recommended that he present to the emergency department as his spouse and he both feel they are no longer safe at home  as he has had multiple recent falls and feels that he continues to worsen almost on a daily basis.  Plan on getting a CT scan as well as preoperative laboratory stories.  We did discuss that we would plan for a transpedicular decompression with a T1-T4 possibly C6-T5 fusion with a T2-3 transpedicular decompression and central decompressions across T1-2 and T3-4.  We discussed that this would be a very high risk procedure, given his rapid progression that he is at high risk for worsening spinal cord injury with reperfusion, need for further surgery, failure of fusion, CSF leak, repeat disc herniation, incomplete decompression, among other risks.  He plans to go to the emergency department for expedited admission and treatment of his severe progressive thoracic myelopathy.  Thank you for involving me in the care of this patient.    Penne MICAEL Sharps MD/MSCR Neurosurgery

## 2023-12-06 ENCOUNTER — Other Ambulatory Visit: Payer: Self-pay

## 2023-12-06 ENCOUNTER — Emergency Department

## 2023-12-06 ENCOUNTER — Inpatient Hospital Stay
Admission: EM | Admit: 2023-12-06 | Discharge: 2023-12-10 | DRG: 448 | Disposition: A | Discharge status: Rehabilitation Facility | Source: Ambulatory Visit | Attending: Internal Medicine | Admitting: Internal Medicine

## 2023-12-06 ENCOUNTER — Encounter: Payer: Self-pay | Admitting: Neurosurgery

## 2023-12-06 ENCOUNTER — Encounter: Payer: Self-pay | Admitting: Emergency Medicine

## 2023-12-06 ENCOUNTER — Ambulatory Visit: Admitting: Neurosurgery

## 2023-12-06 VITALS — BP 136/86 | Ht 68.0 in | Wt 244.0 lb

## 2023-12-06 DIAGNOSIS — D62 Acute posthemorrhagic anemia: Secondary | ICD-10-CM | POA: Diagnosis not present

## 2023-12-06 DIAGNOSIS — Z880 Allergy status to penicillin: Secondary | ICD-10-CM | POA: Diagnosis not present

## 2023-12-06 DIAGNOSIS — Z01818 Encounter for other preprocedural examination: Secondary | ICD-10-CM

## 2023-12-06 DIAGNOSIS — Z800 Family history of malignant neoplasm of digestive organs: Secondary | ICD-10-CM

## 2023-12-06 DIAGNOSIS — M5104 Intervertebral disc disorders with myelopathy, thoracic region: Secondary | ICD-10-CM | POA: Diagnosis not present

## 2023-12-06 DIAGNOSIS — I959 Hypotension, unspecified: Secondary | ICD-10-CM | POA: Diagnosis not present

## 2023-12-06 DIAGNOSIS — R32 Unspecified urinary incontinence: Secondary | ICD-10-CM | POA: Diagnosis present

## 2023-12-06 DIAGNOSIS — F32A Depression, unspecified: Secondary | ICD-10-CM | POA: Diagnosis present

## 2023-12-06 DIAGNOSIS — Z96652 Presence of left artificial knee joint: Secondary | ICD-10-CM | POA: Diagnosis present

## 2023-12-06 DIAGNOSIS — Z91199 Patient's noncompliance with other medical treatment and regimen due to unspecified reason: Secondary | ICD-10-CM | POA: Diagnosis not present

## 2023-12-06 DIAGNOSIS — R531 Weakness: Secondary | ICD-10-CM | POA: Diagnosis not present

## 2023-12-06 DIAGNOSIS — M4800 Spinal stenosis, site unspecified: Secondary | ICD-10-CM | POA: Diagnosis not present

## 2023-12-06 DIAGNOSIS — Z6836 Body mass index (BMI) 36.0-36.9, adult: Secondary | ICD-10-CM | POA: Diagnosis not present

## 2023-12-06 DIAGNOSIS — T148XXA Other injury of unspecified body region, initial encounter: Secondary | ICD-10-CM | POA: Diagnosis not present

## 2023-12-06 DIAGNOSIS — I10 Essential (primary) hypertension: Secondary | ICD-10-CM | POA: Diagnosis not present

## 2023-12-06 DIAGNOSIS — G992 Myelopathy in diseases classified elsewhere: Secondary | ICD-10-CM | POA: Insufficient documentation

## 2023-12-06 DIAGNOSIS — M4714 Other spondylosis with myelopathy, thoracic region: Secondary | ICD-10-CM | POA: Diagnosis not present

## 2023-12-06 DIAGNOSIS — M7989 Other specified soft tissue disorders: Secondary | ICD-10-CM | POA: Diagnosis not present

## 2023-12-06 DIAGNOSIS — E66811 Obesity, class 1: Secondary | ICD-10-CM

## 2023-12-06 DIAGNOSIS — R001 Bradycardia, unspecified: Secondary | ICD-10-CM | POA: Diagnosis not present

## 2023-12-06 DIAGNOSIS — D529 Folate deficiency anemia, unspecified: Secondary | ICD-10-CM | POA: Diagnosis present

## 2023-12-06 DIAGNOSIS — M4804 Spinal stenosis, thoracic region: Secondary | ICD-10-CM | POA: Diagnosis not present

## 2023-12-06 DIAGNOSIS — G4733 Obstructive sleep apnea (adult) (pediatric): Secondary | ICD-10-CM | POA: Diagnosis present

## 2023-12-06 DIAGNOSIS — M5134 Other intervertebral disc degeneration, thoracic region: Secondary | ICD-10-CM | POA: Diagnosis not present

## 2023-12-06 DIAGNOSIS — D638 Anemia in other chronic diseases classified elsewhere: Secondary | ICD-10-CM | POA: Diagnosis not present

## 2023-12-06 DIAGNOSIS — F54 Psychological and behavioral factors associated with disorders or diseases classified elsewhere: Secondary | ICD-10-CM | POA: Diagnosis not present

## 2023-12-06 DIAGNOSIS — E6609 Other obesity due to excess calories: Secondary | ICD-10-CM

## 2023-12-06 DIAGNOSIS — Z820 Family history of epilepsy and other diseases of the nervous system: Secondary | ICD-10-CM

## 2023-12-06 DIAGNOSIS — Z79899 Other long term (current) drug therapy: Secondary | ICD-10-CM

## 2023-12-06 DIAGNOSIS — E785 Hyperlipidemia, unspecified: Secondary | ICD-10-CM | POA: Diagnosis not present

## 2023-12-06 DIAGNOSIS — J9811 Atelectasis: Secondary | ICD-10-CM | POA: Diagnosis not present

## 2023-12-06 LAB — CBC WITH DIFFERENTIAL/PLATELET
Abs Immature Granulocytes: 0.02 K/uL (ref 0.00–0.07)
Basophils Absolute: 0 K/uL (ref 0.0–0.1)
Basophils Relative: 1 %
Eosinophils Absolute: 0.1 K/uL (ref 0.0–0.5)
Eosinophils Relative: 2 %
HCT: 42 % (ref 39.0–52.0)
Hemoglobin: 13.3 g/dL (ref 13.0–17.0)
Immature Granulocytes: 0 %
Lymphocytes Relative: 34 %
Lymphs Abs: 2.2 K/uL (ref 0.7–4.0)
MCH: 27 pg (ref 26.0–34.0)
MCHC: 31.7 g/dL (ref 30.0–36.0)
MCV: 85.2 fL (ref 80.0–100.0)
Monocytes Absolute: 0.5 K/uL (ref 0.1–1.0)
Monocytes Relative: 7 %
Neutro Abs: 3.7 K/uL (ref 1.7–7.7)
Neutrophils Relative %: 56 %
Platelets: 263 K/uL (ref 150–400)
RBC: 4.93 MIL/uL (ref 4.22–5.81)
RDW: 14 % (ref 11.5–15.5)
WBC: 6.5 K/uL (ref 4.0–10.5)
nRBC: 0 % (ref 0.0–0.2)

## 2023-12-06 LAB — BASIC METABOLIC PANEL WITH GFR
Anion gap: 10 (ref 5–15)
BUN: 15 mg/dL (ref 6–20)
CO2: 27 mmol/L (ref 22–32)
Calcium: 9.3 mg/dL (ref 8.9–10.3)
Chloride: 103 mmol/L (ref 98–111)
Creatinine, Ser: 1.14 mg/dL (ref 0.61–1.24)
GFR, Estimated: >60 mL/min (ref >60)
Glucose, Bld: 89 mg/dL (ref 70–99)
Potassium: 3.9 mmol/L (ref 3.5–5.1)
Sodium: 140 mmol/L (ref 135–145)

## 2023-12-06 LAB — APTT: aPTT: 27 s (ref 24–36)

## 2023-12-06 LAB — PROTIME-INR
INR: 1 (ref 0.8–1.2)
Prothrombin Time: 14.1 s (ref 11.4–15.2)

## 2023-12-06 MED ORDER — ONDANSETRON HCL 4 MG PO TABS: 4.0000 mg | ORAL_TABLET | Freq: Four times a day (QID) | ORAL | Status: DC | PRN

## 2023-12-06 MED ORDER — ONDANSETRON HCL 4 MG/2ML IJ SOLN: 4.0000 mg | Freq: Four times a day (QID) | INTRAMUSCULAR | Status: DC | PRN

## 2023-12-06 MED ORDER — PAROXETINE HCL 20 MG PO TABS
20.0000 mg | ORAL_TABLET | Freq: Every day | ORAL | Status: DC
  Administered 2023-12-07 – 2023-12-10 (×4): 20 mg via ORAL
  Filled 2023-12-06 (×4): qty 1

## 2023-12-06 MED ORDER — ACETAMINOPHEN 650 MG RE SUPP
650.0000 mg | Freq: Four times a day (QID) | RECTAL | Status: DC | PRN
Start: 2023-12-06 — End: 2023-12-11

## 2023-12-06 MED ORDER — ACETAMINOPHEN 325 MG PO TABS: 650.0000 mg | ORAL_TABLET | Freq: Four times a day (QID) | ORAL | Status: DC | PRN

## 2023-12-06 NOTE — Assessment & Plan Note (Signed)
 Progressive thoracic myelopathy with high risk spinal cord injury with reperfusion Neurosurgery is aware and plans for OR in the a.m. for T1-T4 posterior spinal instrumentation and fusion, T3 transpedicular decompression, additional laminectomies N.p.o. after midnight

## 2023-12-06 NOTE — Assessment & Plan Note (Signed)
Per neurosurgery  

## 2023-12-06 NOTE — ED Triage Notes (Signed)
 Patient to ED- sent by Dr Penne Sharps for CT scan of back. Pt reports mid back pain with numbness, weakness, and tingling down both legs. Ongoing since May. Suppose to have surgery tomorrow.

## 2023-12-06 NOTE — Hospital Course (Addendum)
 [name] is a 58 year old male with history of thoracic myelopathy who presents emergency department for progressive weakness.  He was evaluated by neurosurgery outpatient and was sent to the emergency department for expedited admission and treatment of severe progressive thoracic myelopathy.  Vitals in the ED showed temperature of 99.4, respiration rate of 18, heart rate 80, blood pressure 132/91, SpO2 99% on room air.  Serum sodium is 140, potassium 3.9, chloride 103, bicarb 27, BUN of 15, serum creatinine 1.14, EGFR greater than 60, nonfasting blood glucose 89, WBC 6.5, hemoglobin 13.3, platelets of 263.  ED treatment: None

## 2023-12-06 NOTE — Assessment & Plan Note (Signed)
 CPAP nightly ordered

## 2023-12-06 NOTE — Assessment & Plan Note (Signed)
 Home paroxetine  20 mg daily resumed

## 2023-12-06 NOTE — ED Provider Notes (Addendum)
 [facility] Provider Note    Event Date/Time   First MD Initiated Contact with Patient 12/06/23 1820     (approximate)   History   Back Pain   HPI  Corey [name] is a 58 year old male with history of thoracic myelopathy presenting to the emergency department for evaluation of progressive weakness.  Patient reports that he has had progressive weakness of his lower extremities as well as unsteady gait with falls.  No bowel or bladder dysfunction.  He was seen in the office with neurosurgery today.  I reviewed his clinic note from today.  Given his back and decline, was recommended he present to the ER for anticipated admission and treatment of his progressive myelopathy.      Physical Exam   Triage Vital Signs: ED Triage Vitals  Encounter Vitals Group     BP 12/06/23 1649 (!) 132/91     Girls Systolic BP Percentile --      Girls Diastolic BP Percentile --      Boys Systolic BP Percentile --      Boys Diastolic BP Percentile --      Pulse Rate 12/06/23 1649 80     Resp 12/06/23 1649 18     Temp 12/06/23 1649 99.4 F (37.4 C)     Temp Source 12/06/23 1649 Oral     SpO2 12/06/23 1649 99 %     Weight 12/06/23 1649 245 lb (111.1 kg)     Height 12/06/23 1649 5' 9 (1.753 m)     Head Circumference --      Peak Flow --      Pain Score 12/06/23 1654 6     Pain Loc --      Pain Education --      Exclude from Growth Chart --     Most recent vital signs: Vitals:   12/06/23 1649 12/06/23 2316  BP: (!) 132/91 129/68  Pulse: 80 (!) 106  Resp: 18 20  Temp: 99.4 F (37.4 C) 98.7 F (37.1 C)  SpO2: 99%      General: Awake, interactive  CV:  Regular rate, good peripheral perfusion.  Resp:  Unlabored respirations.  Abd:  Nondistended.  Neuro:  Symmetric facial movement, fluid speech, 5-5 strength in the bilateral upper extremities, weakness of the bilateral lower extremities.  Able to hold right lower extremity antigravity for few seconds, unable to  hold left lower extremity antigravity.    ED Results / Procedures / Treatments   Labs (all labs ordered are listed, but only abnormal results are displayed) Labs Reviewed  CBC WITH DIFFERENTIAL/PLATELET  BASIC METABOLIC PANEL WITH GFR  PROTIME-INR  APTT  BASIC METABOLIC PANEL WITH GFR  CBC     EKG EKG independently reviewed and interpreted by myself demonstrates:  EKG demonstrates normal sinus rhythm rate of 89, PR 148, QRS 88, QTc 396, no acute ST changes  RADIOLOGY Imaging independently reviewed and interpreted by myself demonstrates:  CT T-spine without obvious fracture on my review, formal radiology read pending  Formal Radiology Read:  CT Thoracic Spine Wo Contrast Result Date: 12/06/2023 CLINICAL DATA:  Rapidly progressive myelopathy and weakness, neurosurgery planning for OR tomorrow EXAM: CT THORACIC SPINE WITHOUT CONTRAST TECHNIQUE: Multidetector CT images of the thoracic were obtained using the standard protocol without intravenous contrast. RADIATION DOSE REDUCTION: This exam was performed according to the departmental dose-optimization program which includes automated exposure control, adjustment of the mA and/or kV according to patient size and/or use of  iterative reconstruction technique. COMPARISON:  MRI of the thoracic spine dated November 25, 2023. FINDINGS: Alignment: Normal. Vertebrae: No fracture or osseous lesion. Paraspinal and other soft tissues: Mild dependent atelectasis within the visualized lungs. Disc levels: T1-2: Left posterolateral disc osteophyte causing moderate left spinal canal and neural foraminal stenosis. T2-3: Bilateral posterolateral endplate spurring common causing moderate central spinal canal stenosis and bilateral neural foraminal stenosis. T3-4: Bilateral uncovertebral joint hypertrophy with mild-to-moderate bilateral neural foraminal stenosis. T4-5: Mild bilateral uncovertebral joint hypertrophy with mild bilateral neural foraminal stenosis. T5-6:  Negative. T6-7: Negative. T7-8: Negative. T8-9: Negative. T9-10: Negative. T10-11: Mild mild disc space narrowing. No apparent significant spinal canal or neural foraminal stenosis. T11-12: Mild disc space narrowing. No apparent significant spinal canal or neural foraminal stenosis. IMPRESSION: 1. Degenerative spurring at T2-3, resulting in moderate central spinal canal stenosis and bilateral neural foraminal stenosis. 2. Left posterolateral bulging disc osteophyte complex at T1-2, causing moderate left-sided spinal canal and neural foraminal stenosis. Electronically Signed   By: Evalene Coho M.D.   On: 12/06/2023 19:47    PROCEDURES:  Critical Care performed: No  Procedures   MEDICATIONS ORDERED IN ED: Medications  acetaminophen  (TYLENOL ) tablet 650 mg (has no administration in time range)    Or  acetaminophen  (TYLENOL ) suppository 650 mg (has no administration in time range)  ondansetron  (ZOFRAN ) tablet 4 mg (has no administration in time range)    Or  ondansetron  (ZOFRAN ) injection 4 mg (has no administration in time range)  PARoxetine  (PAXIL ) tablet 20 mg (has no administration in time range)     IMPRESSION / MDM / ASSESSMENT AND PLAN / ED COURSE  I reviewed the triage vital signs and the nursing notes.  Differential diagnosis includes, but is not limited to, myelopathy confirmed by recent MRI findings, no acute worsening suggested suggestive of acute spinal cord compression  Patient's presentation is most consistent with acute presentation with potential threat to life or bodily function.  58 year old male presenting with progressive myelopathy.  Stable vitals on presentation.  Labs without significant derangement.  CT ordered for preoperative planning.  Will reach out to hospitalist team.  Clinical Course as of 12/06/23 2341  [facility] Dec 06, 2023  1952 Case discussed with Dr. Sherre. She will evaluate for anticipated admission. [NR]    Clinical Course User Index [NR] Levander Slate, MD     FINAL CLINICAL IMPRESSION(S) / ED DIAGNOSES   Final diagnoses:  Thoracic myelopathy     Rx / DC Orders   ED Discharge Orders     None        Note:  This document was prepared using Dragon voice recognition software and may include unintentional dictation errors.   Levander Slate, MD 12/06/23 GLENDIA    Levander Slate, MD 12/06/23 [phone]

## 2023-12-06 NOTE — Assessment & Plan Note (Signed)
 -  This complicates overall care and prognosis.

## 2023-12-06 NOTE — H&P (Signed)
 History and Physical   Corey [name] FMW:[id number] DOB: [birth year] DOA: 12/06/2023  PCP: Joyce Norleen BROCKS, MD  Outpatient Specialists: Dr. Penne Sharps, neurosurgery Patient coming from: Outpatient neurosurgery clinic via POV  I have personally briefly reviewed patient's old medical records in [facility] EMR.  Chief Concern: Lower extremity weakness  HPI: [name] is a 58 year old male with history of thoracic myelopathy who presents emergency department for progressive weakness.  He was evaluated by neurosurgery outpatient and was sent to the emergency department for expedited admission and treatment of severe progressive thoracic myelopathy.  Vitals in the ED showed temperature of 99.4, respiration rate of 18, heart rate 80, blood pressure 132/91, SpO2 99% on room air.  Serum sodium is 140, potassium 3.9, chloride 103, bicarb 27, BUN of 15, serum creatinine 1.14, EGFR greater than 60, nonfasting blood glucose 89, WBC 6.5, hemoglobin 13.3, platelets of 263.  ED treatment: None ----------------------------- At bedside, patient able to tell me his first and last name, age, location, current calendar year.  He denies trauma to his person.  He reports he has been having bilateral lower extremity weakness for about 1 month.  He reports it has progressively worsened.  Social history: He lives at home with his wife.  He operates a [employer].  He reports he is on his feet a lot.  He denies tobacco, EtOH, recreational drug use.  ROS: Constitutional: no weight change, no fever ENT/Mouth: no sore throat, no rhinorrhea Eyes: no eye pain, no vision changes Cardiovascular: no chest pain, no dyspnea,  no edema, no palpitations Respiratory: no cough, no sputum, no wheezing Gastrointestinal: no nausea, no vomiting, no diarrhea, no constipation Genitourinary: no urinary incontinence, no dysuria, no hematuria Musculoskeletal: no arthralgias, no myalgias Skin: no skin lesions, no  pruritus, Neuro: + weakness of bilateral lower extremities, no loss of consciousness, no syncope Psych: no anxiety, no depression, no decrease appetite Heme/Lymph: no bruising, no bleeding  ED Course: Discussed with EDP, patient requiring hospitalization for chief concerns of progressive thoracic myelopathy.  Assessment/Plan  Principal Problem:   Thoracic myelopathy Active Problems:   Hyperlipidemia LDL goal <100   OSA on CPAP   Myelopathy concurrent with and due to spinal stenosis of thoracic region [facility])   Spinal stenosis   Morbid obesity (HCC)   Depression   Assessment and Plan:  * Thoracic myelopathy Progressive thoracic myelopathy with high risk spinal cord injury with reperfusion Neurosurgery is aware and plans for OR in the a.m. for T1-T4 posterior spinal instrumentation and fusion, T3 transpedicular decompression, additional laminectomies N.p.o. after midnight  Depression Home paroxetine  20 mg daily resumed  Morbid obesity (HCC) This complicates overall care and prognosis.   Spinal stenosis Per neurosurgery  OSA on CPAP CPAP nightly ordered  Chart reviewed.   DVT prophylaxis: Pharmacologic DVT not initiated on admission.  AM team to initiate pharmacologic DVT when the benefits outweigh the risk.  Code Status: Full code Diet: Regular diet; n.p.o. after midnight Family Communication: Spouse at bedside Disposition Plan: Pending clinical course Consults called: Neurosurgery Admission status: Inpatient, PCU  Past Medical History:  Diagnosis Date   Arthritis    knee, left   ED (erectile dysfunction)    Hyperlipidemia    Hypertension    Sleep apnea    CPAP - not using lately due to recall    Past Surgical History:  Procedure Laterality Date   ANKLE BONE SURGERY     BACK SURGERY     COLLAR BONE SURGEY  COLONOSCOPY     FRACTURE SURGERY     HERNIA REPAIR     KNEE ARTHROSCOPY Left 2006   LAPAROSCOPIC APPENDECTOMY N/A 01/13/2021   Procedure:  APPENDECTOMY LAPAROSCOPIC WITH LYSIS OF ADHESIONS;  Surgeon: Sheldon Standing, MD;  Location: [facility] ORS;  Service: General;  Laterality: N/A;   LUMBAR MICRODISCECTOMY     POLYPECTOMY     SKIN BIOPSY Left 08/11/2021   nodulocystic fat nercrosis   TOTAL KNEE ARTHROPLASTY Left 11/11/2020   Procedure: LEFT TOTAL KNEE ARTHROPLASTY;  Surgeon: Barbarann Oneil BROCKS, MD;  Location: [facility] OR;  Service: Orthopedics;  Laterality: Left;   UMBILICAL HERNIA REPAIR     Social History:  reports that he has never smoked. He has never used smokeless tobacco. He reports current alcohol use of about 3.0 standard drinks of alcohol per week. He reports that he does not currently use drugs after having used the following drugs: Marijuana.  Allergies  Allergen Reactions   Penicillins Other (See Comments)    Makes pt black out   Family History  Problem Relation Age of Onset   Alzheimer's disease Mother    Stevens-Johnson syndrome Father    Autism Brother    Colon cancer Other    Colon polyps Neg Hx    Esophageal cancer Neg Hx    Rectal cancer Neg Hx    Stomach cancer Neg Hx    Family history: Family history reviewed and not pertinent.  Prior to Admission medications   Medication Sig Start Date End Date Taking? Authorizing Provider  methocarbamol  (ROBAXIN ) 500 MG tablet Take 1 tablet (500 mg total) by mouth every 6 (six) hours as needed for muscle spasms. 11/11/23   Persons, Ronal Dragon, PA  Multiple Vitamins-Minerals (HAIR SKIN AND NAILS FORMULA PO) Take by mouth.    [provider]  Multiple Vitamins-Minerals (MULTIVITAMIN WITH MINERALS) tablet Take 2 tablets by mouth daily.    [provider]  PARoxetine  (PAXIL ) 20 MG tablet TAKE 1 TABLET BY MOUTH EVERY DAY IN THE MORNING 11/11/23   Joyce Norleen BROCKS, MD   Physical Exam: Vitals:   12/06/23 1649 12/06/23 1654  BP: (!) 132/91   Pulse: 80   Resp: 18   Temp: 99.4 F (37.4 C)   TempSrc: Oral   SpO2: 99%   Weight: 111.1 kg 111.1 kg  Height: 5' 9 (1.753  m) 5' 9 (1.753 m)   Constitutional: appears age-appropriate, NAD, calm Eyes: PERRL, lids and conjunctivae normal ENMT: Mucous membranes are moist. Posterior pharynx clear of any exudate or lesions. Age-appropriate dentition. Hearing appropriate Neck: normal, supple, no masses, no thyromegaly Respiratory: clear to auscultation bilaterally, no wheezing, no crackles. Normal respiratory effort. No accessory muscle use.  Cardiovascular: Regular rate and rhythm, no murmurs / rubs / gallops. No extremity edema. 2+ pedal pulses. No carotid bruits.  Abdomen: no tenderness, no masses palpated, no hepatosplenomegaly. Bowel sounds positive.  Musculoskeletal: no clubbing / cyanosis. No joint deformity upper and lower extremities. Good ROM, no contractures, no atrophy. Normal muscle tone.  Skin: no rashes, lesions, ulcers. No induration Neurologic: Sensation intact. Strength 5/5 in bilateral upper extremities.  Strength is 3-4 out of 5 in bilateral lower extremity.  Left lower extremity is weaker than the right. Psychiatric: Normal judgment and insight. Alert and oriented x 3. Normal mood.   EKG: independently reviewed, showing sinus rhythm with rate of 89, QTc 396  Chest x-ray on Admission: I personally reviewed and I agree with radiologist reading as below.  CT Thoracic Spine  Wo Contrast Result Date: 12/06/2023 CLINICAL DATA:  Rapidly progressive myelopathy and weakness, neurosurgery planning for OR tomorrow EXAM: CT THORACIC SPINE WITHOUT CONTRAST TECHNIQUE: Multidetector CT images of the thoracic were obtained using the standard protocol without intravenous contrast. RADIATION DOSE REDUCTION: This exam was performed according to the departmental dose-optimization program which includes automated exposure control, adjustment of the mA and/or kV according to patient size and/or use of iterative reconstruction technique. COMPARISON:  MRI of the thoracic spine dated November 25, 2023. FINDINGS: Alignment: Normal.  Vertebrae: No fracture or osseous lesion. Paraspinal and other soft tissues: Mild dependent atelectasis within the visualized lungs. Disc levels: T1-2: Left posterolateral disc osteophyte causing moderate left spinal canal and neural foraminal stenosis. T2-3: Bilateral posterolateral endplate spurring common causing moderate central spinal canal stenosis and bilateral neural foraminal stenosis. T3-4: Bilateral uncovertebral joint hypertrophy with mild-to-moderate bilateral neural foraminal stenosis. T4-5: Mild bilateral uncovertebral joint hypertrophy with mild bilateral neural foraminal stenosis. T5-6: Negative. T6-7: Negative. T7-8: Negative. T8-9: Negative. T9-10: Negative. T10-11: Mild mild disc space narrowing. No apparent significant spinal canal or neural foraminal stenosis. T11-12: Mild disc space narrowing. No apparent significant spinal canal or neural foraminal stenosis. IMPRESSION: 1. Degenerative spurring at T2-3, resulting in moderate central spinal canal stenosis and bilateral neural foraminal stenosis. 2. Left posterolateral bulging disc osteophyte complex at T1-2, causing moderate left-sided spinal canal and neural foraminal stenosis. Electronically Signed   By: Evalene Coho M.D.   On: 12/06/2023 19:47   Labs on Admission: I have personally reviewed following labs  CBC: Recent Labs  Lab 12/06/23 1700  WBC 6.5  NEUTROABS 3.7  HGB 13.3  HCT 42.0  MCV 85.2  PLT 263   Basic Metabolic Panel: Recent Labs  Lab 12/06/23 1700  NA 140  K 3.9  CL 103  CO2 27  GLUCOSE 89  BUN 15  CREATININE 1.14  CALCIUM  9.3   GFR: Estimated Creatinine Clearance: 90 mL/min (by C-G formula based on SCr of 1.14 mg/dL).  Coagulation Profile: Recent Labs  Lab 12/06/23 1700  INR 1.0   Urine analysis:    Component Value Date/Time   COLORURINE ORANGE (A) 01/12/2021 1329   APPEARANCEUR CLEAR 01/12/2021 1329   LABSPEC 1.020 01/12/2021 1329   LABSPEC 1.020 10/02/2019 1206   PHURINE 7.0  01/12/2021 1329   GLUCOSEU NEGATIVE 01/12/2021 1329   HGBUR TRACE (A) 01/12/2021 1329   BILIRUBINUR NEGATIVE 01/12/2021 1329   BILIRUBINUR negative 10/02/2019 1206   BILIRUBINUR n 08/31/2016 0917   KETONESUR NEGATIVE 01/12/2021 1329   PROTEINUR TRACE (A) 01/12/2021 1329   UROBILINOGEN negative 08/31/2016 0917   NITRITE NEGATIVE 01/12/2021 1329   LEUKOCYTESUR NEGATIVE 01/12/2021 1329   This document was prepared using Dragon Voice Recognition software and may include unintentional dictation errors.  Dr. Sherre [facility]  If 7PM-7AM, please contact overnight-coverage provider If 7AM-7PM, please contact day attending provider [email or web address]  12/06/2023, 8:20 PM

## 2023-12-06 NOTE — ED Notes (Signed)
 Pt assisted to bathroom at this time using walker.

## 2023-12-07 ENCOUNTER — Inpatient Hospital Stay

## 2023-12-07 ENCOUNTER — Encounter: Payer: Self-pay | Admitting: Internal Medicine

## 2023-12-07 ENCOUNTER — Encounter
Admission: EM | Disposition: A | Discharge status: Rehabilitation Facility | Payer: Self-pay | Source: Ambulatory Visit | Attending: Hospitalist

## 2023-12-07 ENCOUNTER — Telehealth: Payer: Self-pay | Admitting: Orthopedic Surgery

## 2023-12-07 ENCOUNTER — Other Ambulatory Visit

## 2023-12-07 ENCOUNTER — Other Ambulatory Visit: Payer: Self-pay

## 2023-12-07 DIAGNOSIS — M5104 Intervertebral disc disorders with myelopathy, thoracic region: Secondary | ICD-10-CM

## 2023-12-07 DIAGNOSIS — Z0189 Encounter for other specified special examinations: Secondary | ICD-10-CM | POA: Diagnosis not present

## 2023-12-07 DIAGNOSIS — M4714 Other spondylosis with myelopathy, thoracic region: Secondary | ICD-10-CM | POA: Diagnosis not present

## 2023-12-07 DIAGNOSIS — G9589 Other specified diseases of spinal cord: Secondary | ICD-10-CM | POA: Diagnosis not present

## 2023-12-07 DIAGNOSIS — M4804 Spinal stenosis, thoracic region: Secondary | ICD-10-CM | POA: Diagnosis not present

## 2023-12-07 HISTORY — PX: APPLICATION OF INTRAOPERATIVE CT SCAN: SHX6668

## 2023-12-07 LAB — BASIC METABOLIC PANEL WITH GFR
Anion gap: 11 (ref 5–15)
Anion gap: 8 (ref 5–15)
BUN: 15 mg/dL (ref 6–20)
BUN: 14 mg/dL (ref 6–20)
CO2: 27 mmol/L (ref 22–32)
CO2: 28 mmol/L (ref 22–32)
Calcium: 8.8 mg/dL — ABNORMAL LOW (ref 8.9–10.3)
Calcium: 8.8 mg/dL — ABNORMAL LOW (ref 8.9–10.3)
Chloride: 104 mmol/L (ref 98–111)
Chloride: 106 mmol/L (ref 98–111)
Creatinine, Ser: 1.29 mg/dL — ABNORMAL HIGH (ref 0.61–1.24)
Creatinine, Ser: 1.23 mg/dL (ref 0.61–1.24)
GFR, Estimated: >60 mL/min (ref >60)
GFR, Estimated: >60 mL/min (ref >60)
Glucose, Bld: 124 mg/dL — ABNORMAL HIGH (ref 70–99)
Glucose, Bld: 115 mg/dL — ABNORMAL HIGH (ref 70–99)
Potassium: 3.5 mmol/L (ref 3.5–5.1)
Potassium: 4 mmol/L (ref 3.5–5.1)
Sodium: 142 mmol/L (ref 135–145)
Sodium: 142 mmol/L (ref 135–145)

## 2023-12-07 LAB — CBC
HCT: 39.3 % (ref 39.0–52.0)
HCT: 40.2 % (ref 39.0–52.0)
Hemoglobin: 12.6 g/dL — ABNORMAL LOW (ref 13.0–17.0)
Hemoglobin: 12.9 g/dL — ABNORMAL LOW (ref 13.0–17.0)
MCH: 27.6 pg (ref 26.0–34.0)
MCH: 27.3 pg (ref 26.0–34.0)
MCHC: 32.1 g/dL (ref 30.0–36.0)
MCHC: 32.1 g/dL (ref 30.0–36.0)
MCV: 86 fL (ref 80.0–100.0)
MCV: 85 fL (ref 80.0–100.0)
Platelets: 254 K/uL (ref 150–400)
Platelets: 254 K/uL (ref 150–400)
RBC: 4.57 MIL/uL (ref 4.22–5.81)
RBC: 4.73 MIL/uL (ref 4.22–5.81)
RDW: 14.3 % (ref 11.5–15.5)
RDW: 14.1 % (ref 11.5–15.5)
WBC: 6.3 K/uL (ref 4.0–10.5)
WBC: 5 K/uL (ref 4.0–10.5)
nRBC: 0 % (ref 0.0–0.2)
nRBC: 0 % (ref 0.0–0.2)

## 2023-12-07 LAB — SURGICAL PCR SCREEN
MRSA, PCR: NEGATIVE
Staphylococcus aureus: NEGATIVE

## 2023-12-07 SURGERY — POSTERIOR THORACIC FUSION 3 LEVEL
Anesthesia: General | Site: Spine Thoracic

## 2023-12-07 MED ORDER — KETOROLAC TROMETHAMINE 15 MG/ML IJ SOLN
INTRAMUSCULAR | Status: AC
  Filled 2023-12-07: qty 1

## 2023-12-07 MED ORDER — ENOXAPARIN SODIUM 40 MG/0.4ML IJ SOSY
40.0000 mg | PREFILLED_SYRINGE | INTRAMUSCULAR | Status: DC
  Administered 2023-12-08 – 2023-12-10 (×3): 40 mg via SUBCUTANEOUS
  Filled 2023-12-07 (×3): qty 0.4

## 2023-12-07 MED ORDER — 0.9 % SODIUM CHLORIDE (POUR BTL) OPTIME
TOPICAL | Status: DC | PRN
  Administered 2023-12-07: 500 mL

## 2023-12-07 MED ORDER — REMIFENTANIL HCL 1 MG IV SOLR
INTRAVENOUS | Status: AC
Start: 2023-12-07 — End: 2023-12-07
  Filled 2023-12-07: qty 1000

## 2023-12-07 MED ORDER — FENTANYL CITRATE (PF) 100 MCG/2ML IJ SOLN
25.0000 ug | INTRAMUSCULAR | Status: DC | PRN
  Administered 2023-12-07 (×2): 50 ug via INTRAVENOUS

## 2023-12-07 MED ORDER — SORBITOL 70 % SOLN: 30.0000 mL | Freq: Every day | Status: DC | PRN

## 2023-12-07 MED ORDER — LACTATED RINGERS IV SOLN: INTRAVENOUS | Status: DC | PRN

## 2023-12-07 MED ORDER — DEXAMETHASONE SODIUM PHOSPHATE 10 MG/ML IJ SOLN
INTRAMUSCULAR | Status: AC
  Filled 2023-12-07: qty 1

## 2023-12-07 MED ORDER — PHENYLEPHRINE HCL-NACL 20-0.9 MG/250ML-% IV SOLN
INTRAVENOUS | Status: DC | PRN
  Administered 2023-12-07: 30 ug/min via INTRAVENOUS

## 2023-12-07 MED ORDER — SODIUM CHLORIDE 0.9% FLUSH: 3.0000 mL | INTRAVENOUS | Status: DC | PRN

## 2023-12-07 MED ORDER — HYDROMORPHONE HCL 1 MG/ML IJ SOLN
INTRAMUSCULAR | Status: AC
  Filled 2023-12-07: qty 1

## 2023-12-07 MED ORDER — OXYCODONE HCL 5 MG PO TABS
5.0000 mg | ORAL_TABLET | ORAL | Status: DC | PRN
  Filled 2023-12-07: qty 1

## 2023-12-07 MED ORDER — PROPOFOL 500 MG/50ML IV EMUL
INTRAVENOUS | Status: DC | PRN
Start: 2023-12-07 — End: 2023-12-07
  Administered 2023-12-07 (×2): 100 ug/kg/min via INTRAVENOUS

## 2023-12-07 MED ORDER — POVIDONE-IODINE 10 % EX SWAB: 2.0000 | Freq: Once | CUTANEOUS | Status: DC

## 2023-12-07 MED ORDER — MAGNESIUM CITRATE PO SOLN: 1.0000 | Freq: Once | ORAL | Status: DC | PRN

## 2023-12-07 MED ORDER — PROPOFOL 10 MG/ML IV BOLUS
INTRAVENOUS | Status: AC
  Filled 2023-12-07: qty 20

## 2023-12-07 MED ORDER — GLYCOPYRROLATE 0.2 MG/ML IJ SOLN
INTRAMUSCULAR | Status: DC | PRN
Start: 2023-12-07 — End: 2023-12-07
  Administered 2023-12-07: .2 mg via INTRAVENOUS

## 2023-12-07 MED ORDER — MIDAZOLAM HCL 2 MG/2ML IJ SOLN
INTRAMUSCULAR | Status: DC | PRN
  Administered 2023-12-07: 2 mg via INTRAVENOUS

## 2023-12-07 MED ORDER — PHENOL 1.4 % MT LIQD: 1.0000 | OROMUCOSAL | Status: DC | PRN

## 2023-12-07 MED ORDER — FENTANYL CITRATE (PF) 100 MCG/2ML IJ SOLN
INTRAMUSCULAR | Status: AC
Start: 2023-12-07 — End: 2023-12-07
  Filled 2023-12-07: qty 2

## 2023-12-07 MED ORDER — METHOCARBAMOL 1000 MG/10ML IJ SOLN: 500.0000 mg | Freq: Four times a day (QID) | INTRAMUSCULAR | Status: DC | PRN

## 2023-12-07 MED ORDER — HYDROMORPHONE HCL 1 MG/ML IJ SOLN
INTRAMUSCULAR | Status: DC | PRN
  Administered 2023-12-07: .5 mg via INTRAVENOUS

## 2023-12-07 MED ORDER — OXYCODONE HCL 5 MG PO TABS: 5.0000 mg | ORAL_TABLET | Freq: Once | ORAL | Status: DC | PRN

## 2023-12-07 MED ORDER — PHENYLEPHRINE HCL-NACL 20-0.9 MG/250ML-% IV SOLN
INTRAVENOUS | Status: AC
  Filled 2023-12-07: qty 250

## 2023-12-07 MED ORDER — PROPOFOL 1000 MG/100ML IV EMUL
INTRAVENOUS | Status: AC
  Filled 2023-12-07: qty 100

## 2023-12-07 MED ORDER — TRANEXAMIC ACID-NACL 1000-0.7 MG/100ML-% IV SOLN
1000.0000 mg | INTRAVENOUS | Status: AC
  Administered 2023-12-07: 1000 mg via INTRAVENOUS

## 2023-12-07 MED ORDER — FENTANYL CITRATE (PF) 100 MCG/2ML IJ SOLN
INTRAMUSCULAR | Status: DC | PRN
  Administered 2023-12-07 (×2): 50 ug via INTRAVENOUS

## 2023-12-07 MED ORDER — OXYCODONE HCL 5 MG PO TABS
10.0000 mg | ORAL_TABLET | ORAL | Status: DC | PRN
  Administered 2023-12-08 – 2023-12-10 (×3): 10 mg via ORAL
  Filled 2023-12-07 (×3): qty 2

## 2023-12-07 MED ORDER — CEFAZOLIN SODIUM-DEXTROSE 2-4 GM/100ML-% IV SOLN
2.0000 g | INTRAVENOUS | Status: AC
  Administered 2023-12-07: 2 g via INTRAVENOUS

## 2023-12-07 MED ORDER — KETAMINE HCL 50 MG/5ML IJ SOSY
PREFILLED_SYRINGE | INTRAMUSCULAR | Status: AC
Start: 2023-12-07 — End: 2023-12-07
  Filled 2023-12-07: qty 5

## 2023-12-07 MED ORDER — PHENYLEPHRINE 80 MCG/ML (10ML) SYRINGE FOR IV PUSH (FOR BLOOD PRESSURE SUPPORT): PREFILLED_SYRINGE | INTRAVENOUS | Status: DC | PRN

## 2023-12-07 MED ORDER — SODIUM CHLORIDE 0.9 % IV SOLN: INTRAVENOUS | Status: DC | PRN

## 2023-12-07 MED ORDER — ACETAMINOPHEN 325 MG PO TABS: 650.0000 mg | ORAL_TABLET | ORAL | Status: DC | PRN

## 2023-12-07 MED ORDER — ONDANSETRON HCL 4 MG/2ML IJ SOLN: 4.0000 mg | Freq: Four times a day (QID) | INTRAMUSCULAR | Status: DC | PRN

## 2023-12-07 MED ORDER — VANCOMYCIN HCL 1000 MG IV SOLR
INTRAVENOUS | Status: AC
  Filled 2023-12-07: qty 20

## 2023-12-07 MED ORDER — PROPOFOL 1000 MG/100ML IV EMUL
INTRAVENOUS | Status: AC
Start: 2023-12-07 — End: 2023-12-07
  Filled 2023-12-07: qty 100

## 2023-12-07 MED ORDER — HYDROMORPHONE HCL 1 MG/ML IJ SOLN: 0.5000 mg | INTRAMUSCULAR | Status: AC | PRN

## 2023-12-07 MED ORDER — ACETAMINOPHEN 10 MG/ML IV SOLN
INTRAVENOUS | Status: DC | PRN
  Administered 2023-12-07: 1000 mg via INTRAVENOUS

## 2023-12-07 MED ORDER — TRANEXAMIC ACID-NACL 1000-0.7 MG/100ML-% IV SOLN
INTRAVENOUS | Status: AC
  Filled 2023-12-07: qty 100

## 2023-12-07 MED ORDER — ACETAMINOPHEN 650 MG RE SUPP: 650.0000 mg | RECTAL | Status: DC | PRN

## 2023-12-07 MED ORDER — PROPOFOL 10 MG/ML IV BOLUS
INTRAVENOUS | Status: DC | PRN
  Administered 2023-12-07: 200 mg via INTRAVENOUS
  Administered 2023-12-07: 30 mg via INTRAVENOUS
  Administered 2023-12-07: 40 mg via INTRAVENOUS
  Administered 2023-12-07: 30 mg via INTRAVENOUS

## 2023-12-07 MED ORDER — DEXAMETHASONE SODIUM PHOSPHATE 10 MG/ML IJ SOLN
10.0000 mg | Freq: Once | INTRAMUSCULAR | Status: AC
Start: 2023-12-07 — End: 2023-12-07
  Administered 2023-12-07: 10 mg via INTRAVENOUS

## 2023-12-07 MED ORDER — KETOROLAC TROMETHAMINE 15 MG/ML IJ SOLN
7.5000 mg | Freq: Four times a day (QID) | INTRAMUSCULAR | Status: AC
  Administered 2023-12-07 – 2023-12-08 (×4): 7.5 mg via INTRAVENOUS
  Filled 2023-12-07 (×3): qty 1

## 2023-12-07 MED ORDER — FENTANYL CITRATE (PF) 100 MCG/2ML IJ SOLN
INTRAMUSCULAR | Status: AC
  Filled 2023-12-07: qty 2

## 2023-12-07 MED ORDER — ACETAMINOPHEN 10 MG/ML IV SOLN
INTRAVENOUS | Status: AC
  Filled 2023-12-07: qty 100

## 2023-12-07 MED ORDER — SENNA 8.6 MG PO TABS
1.0000 | ORAL_TABLET | Freq: Two times a day (BID) | ORAL | Status: DC
  Administered 2023-12-08 – 2023-12-10 (×5): 8.6 mg via ORAL
  Filled 2023-12-07 (×6): qty 1

## 2023-12-07 MED ORDER — MIDAZOLAM HCL 2 MG/2ML IJ SOLN
INTRAMUSCULAR | Status: AC
  Filled 2023-12-07: qty 2

## 2023-12-07 MED ORDER — MENTHOL 3 MG MT LOZG: 1.0000 | LOZENGE | OROMUCOSAL | Status: DC | PRN

## 2023-12-07 MED ORDER — METHOCARBAMOL 500 MG PO TABS: 500.0000 mg | ORAL_TABLET | Freq: Four times a day (QID) | ORAL | Status: DC | PRN

## 2023-12-07 MED ORDER — ONDANSETRON HCL 4 MG PO TABS: 4.0000 mg | ORAL_TABLET | Freq: Four times a day (QID) | ORAL | Status: DC | PRN

## 2023-12-07 MED ORDER — OXYCODONE HCL 5 MG/5ML PO SOLN: 5.0000 mg | Freq: Once | ORAL | Status: DC | PRN

## 2023-12-07 MED ORDER — LIDOCAINE HCL (CARDIAC) PF 100 MG/5ML IV SOSY
PREFILLED_SYRINGE | INTRAVENOUS | Status: DC | PRN
Start: 2023-12-07 — End: 2023-12-07
  Administered 2023-12-07: 100 mg via INTRAVENOUS

## 2023-12-07 MED ORDER — SURGIFLO WITH THROMBIN (HEMOSTATIC MATRIX KIT) OPTIME
TOPICAL | Status: DC | PRN
  Administered 2023-12-07: 1 via TOPICAL

## 2023-12-07 MED ORDER — ONDANSETRON HCL 4 MG/2ML IJ SOLN
INTRAMUSCULAR | Status: DC | PRN
  Administered 2023-12-07: 4 mg via INTRAVENOUS

## 2023-12-07 MED ORDER — SODIUM CHLORIDE 0.9% FLUSH
3.0000 mL | Freq: Two times a day (BID) | INTRAVENOUS | Status: DC
  Administered 2023-12-08 – 2023-12-10 (×5): 3 mL via INTRAVENOUS

## 2023-12-07 MED ORDER — POLYETHYLENE GLYCOL 3350 17 G PO PACK
17.0000 g | PACK | Freq: Every day | ORAL | Status: DC | PRN
  Administered 2023-12-10: 17 g via ORAL
  Filled 2023-12-07: qty 1

## 2023-12-07 MED ORDER — MUPIROCIN 2 % EX OINT
1.0000 | TOPICAL_OINTMENT | Freq: Two times a day (BID) | CUTANEOUS | Status: DC
Start: 2023-12-07 — End: 2023-12-10
  Administered 2023-12-07 – 2023-12-10 (×7): 1 via NASAL
  Filled 2023-12-07 (×2): qty 22

## 2023-12-07 MED ORDER — CEFAZOLIN SODIUM-DEXTROSE 2-4 GM/100ML-% IV SOLN
INTRAVENOUS | Status: AC
  Filled 2023-12-07: qty 100

## 2023-12-07 MED ORDER — SODIUM CHLORIDE 0.9 % IV SOLN: 250.0000 mL | INTRAVENOUS | Status: AC

## 2023-12-07 MED ORDER — DOCUSATE SODIUM 100 MG PO CAPS
100.0000 mg | ORAL_CAPSULE | Freq: Two times a day (BID) | ORAL | Status: DC
  Administered 2023-12-08 – 2023-12-10 (×5): 100 mg via ORAL
  Filled 2023-12-07 (×6): qty 1

## 2023-12-07 MED ORDER — KETAMINE HCL 10 MG/ML IJ SOLN
INTRAMUSCULAR | Status: DC | PRN
Start: 2023-12-07 — End: 2023-12-07
  Administered 2023-12-07: 20 mg via INTRAVENOUS
  Administered 2023-12-07: 10 mg via INTRAVENOUS
  Administered 2023-12-07: 20 mg via INTRAVENOUS

## 2023-12-07 MED ORDER — SUCCINYLCHOLINE CHLORIDE 200 MG/10ML IV SOSY
PREFILLED_SYRINGE | INTRAVENOUS | Status: DC | PRN
  Administered 2023-12-07: 100 mg via INTRAVENOUS

## 2023-12-07 MED ORDER — SODIUM CHLORIDE (PF) 0.9 % IJ SOLN
INTRAMUSCULAR | Status: DC | PRN
  Administered 2023-12-07: 60 mL via SURGICAL_CAVITY

## 2023-12-07 MED ORDER — REMIFENTANIL HCL 1 MG IV SOLR
INTRAVENOUS | Status: DC | PRN
  Administered 2023-12-07 (×2): .2 ug/kg/min via INTRAVENOUS
  Administered 2023-12-07: .15 ug/kg/min via INTRAVENOUS
  Administered 2023-12-07: .2 ug/kg/min via INTRAVENOUS

## 2023-12-07 MED ORDER — ACETAMINOPHEN 500 MG PO TABS
1000.0000 mg | ORAL_TABLET | Freq: Four times a day (QID) | ORAL | Status: DC
  Administered 2023-12-08 – 2023-12-10 (×6): 1000 mg via ORAL
  Filled 2023-12-07 (×7): qty 2

## 2023-12-07 SURGICAL SUPPLY — 48 items
ALLOGRAFT BONESTRIP KORE 2.5X5 (Bone Implant) IMPLANT
BASIN KIT SINGLE STR (MISCELLANEOUS) ×2 IMPLANT
BUR NEURO DRILL SOFT 3.0X3.8M (BURR) ×2 IMPLANT
COVER PROBE FLX POLY STRL (MISCELLANEOUS) IMPLANT
DERMABOND ADVANCED .7 DNX12 (GAUZE/BANDAGES/DRESSINGS) IMPLANT
DRAPE C-ARM 42X70 (DRAPES) IMPLANT
DRAPE C-ARM 42X72 X-RAY (DRAPES) IMPLANT
DRAPE LAPAROTOMY 100X77 ABD (DRAPES) ×2 IMPLANT
DRAPE SCAN PATIENT (DRAPES) IMPLANT
DRAPE TABLE BACK 80X90 (DRAPES) ×2 IMPLANT
ELECTRODE REM PT RTRN 9FT ADLT (ELECTROSURGICAL) ×2 IMPLANT
EVACUATOR 1/8 PVC DRAIN (DRAIN) IMPLANT
FEE INTRAOP CADWELL SUPPLY NCS (MISCELLANEOUS) IMPLANT
FEE INTRAOP MONITOR IMPULS NCS (MISCELLANEOUS) IMPLANT
GAUZE 4X4 16PLY ~~LOC~~+RFID DBL (SPONGE) IMPLANT
GLOVE BIOGEL PI IND STRL 7.0 (GLOVE) ×2 IMPLANT
GLOVE BIOGEL PI IND STRL 8 (GLOVE) ×4 IMPLANT
GLOVE SURG SYN 7.0 (GLOVE) ×4 IMPLANT
GLOVE SURG SYN 7.0 PF PI (GLOVE) ×4 IMPLANT
GLOVE SURG SYN 7.5 E (GLOVE) ×4 IMPLANT
GLOVE SURG SYN 7.5 PF PI (GLOVE) ×4 IMPLANT
GOWN SRG LRG LVL 4 IMPRV REINF (GOWNS) ×2 IMPLANT
GOWN SRG XL LVL 3 NONREINFORCE (GOWNS) ×2 IMPLANT
HOLDER FOLEY CATH W/STRAP (MISCELLANEOUS) IMPLANT
KIT PREVENA INCISION MGT 13 (CANNISTER) IMPLANT
KIT PREVENA INCISION MGT20CM45 (CANNISTER) IMPLANT
KIT SPINAL PRONEVIEW (KITS) ×2 IMPLANT
LAVAGE JET IRRISEPT WOUND (IRRIGATION / IRRIGATOR) ×2 IMPLANT
MANIFOLD NEPTUNE II (INSTRUMENTS) ×2 IMPLANT
MARKER SKIN DUAL TIP RULER LAB (MISCELLANEOUS) ×2 IMPLANT
MAT ABSORB FLUID 56X50 GRAY (MISCELLANEOUS) IMPLANT
NDL SAFETY ECLIPSE 18X1.5 (NEEDLE) ×2 IMPLANT
NS IRRIG 500ML POUR BTL (IV SOLUTION) ×2 IMPLANT
PACK LAMINECTOMY ARMC (PACKS) ×2 IMPLANT
PAD ARMBOARD POSITIONER FOAM (MISCELLANEOUS) ×2 IMPLANT
PENCIL SMOKE EVACUATOR (MISCELLANEOUS) IMPLANT
ROD RELINE 5.5X90MM LORDOTIC (Rod) IMPLANT
SCREW LOCK RELINE 5.5 TULIP (Screw) IMPLANT
SCREW RELINE 5.0X30MM POLY (Screw) IMPLANT
STAPLER SKIN PROX 35W (STAPLE) IMPLANT
SURGIFLO W/THROMBIN 8M KIT (HEMOSTASIS) ×2 IMPLANT
SUT STRATA 3-0 15 PS-2 (SUTURE) ×2 IMPLANT
SUT VIC AB 0 CT1 27XCR 8 STRN (SUTURE) ×4 IMPLANT
SUT VIC AB 2-0 CT1 18 (SUTURE) ×4 IMPLANT
SUTURE EHLN 3-0 FS-10 30 BLK (SUTURE) IMPLANT
SYR 30ML LL (SYRINGE) ×4 IMPLANT
TRAP FLUID SMOKE EVACUATOR (MISCELLANEOUS) ×2 IMPLANT
WATER STERILE IRR 1000ML POUR (IV SOLUTION) IMPLANT

## 2023-12-07 NOTE — Anesthesia Procedure Notes (Addendum)
 Procedure Name: Intubation Date/Time: 12/07/2023 3:03 PM  Performed by: Dyane Mass, CRNAPre-anesthesia Checklist: Patient identified, Emergency Drugs available, Suction available and Patient being monitored Patient Re-evaluated:Patient Re-evaluated prior to induction Oxygen Delivery Method: Circle system utilized Preoxygenation: Pre-oxygenation with 100% oxygen Induction Type: IV induction Ventilation: Mask ventilation without difficulty Laryngoscope Size: McGrath and 4 Grade View: Grade I Tube type: Oral Tube size: 7.5 mm Number of attempts: 1 Airway Equipment and Method: Stylet Placement Confirmation: ETT inserted through vocal cords under direct vision, positive ETCO2 and breath sounds checked- equal and bilateral Secured at: 23 cm Tube secured with: Tape Dental Injury: Teeth and Oropharynx as per pre-operative assessment

## 2023-12-07 NOTE — Telephone Encounter (Signed)
 I spoke with the patient today. He has decided to cancel his surgery and proceed with Neurosurgery instead.

## 2023-12-07 NOTE — Anesthesia Preprocedure Evaluation (Signed)
 Anesthesia Evaluation  Patient identified by MRN, date of birth, ID band Patient awake    Reviewed: Allergy  & Precautions, NPO status , Patient's Chart, lab work & pertinent test results, reviewed documented beta blocker date and time   Airway Mallampati: III  TM Distance: >3 FB Neck ROM: Full    Dental  (+) Teeth Intact, Dental Advisory Given, Missing, Caps   Pulmonary sleep apnea and Continuous Positive Airway Pressure Ventilation  Non compliant with CPAP   Pulmonary exam normal breath sounds clear to auscultation       Cardiovascular hypertension, negative cardio ROS Normal cardiovascular exam Rhythm:Regular Rate:Normal     Neuro/Psych  PSYCHIATRIC DISORDERS  Depression    negative neurological ROS     GI/Hepatic negative GI ROS, Neg liver ROS,,,Acute appendicitis   Endo/Other  negative endocrine ROS  Obesity Hyperlipidemia  Renal/GU      Musculoskeletal   Abdominal   Peds  Hematology negative hematology ROS (+)   Anesthesia Other Findings Past Medical History: No date: Arthritis     Comment:  knee, left No date: ED (erectile dysfunction) No date: Hyperlipidemia No date: Hypertension No date: Sleep apnea     Comment:  CPAP - not using lately due to recall   Past Surgical History: No date: ANKLE BONE SURGERY No date: BACK SURGERY No date: COLLAR BONE SURGEY No date: COLONOSCOPY No date: FRACTURE SURGERY No date: HERNIA REPAIR 2006: KNEE ARTHROSCOPY; Left 01/13/2021: LAPAROSCOPIC APPENDECTOMY; N/A     Comment:  Procedure: APPENDECTOMY LAPAROSCOPIC WITH LYSIS OF               ADHESIONS;  Surgeon: Sheldon Standing, MD;  Location: [facility]               ORS;  Service: General;  Laterality: N/A; No date: LUMBAR MICRODISCECTOMY No date: POLYPECTOMY 08/11/2021: SKIN BIOPSY; Left     Comment:  nodulocystic fat nercrosis 11/11/2020: TOTAL KNEE ARTHROPLASTY; Left     Comment:  Procedure: LEFT TOTAL KNEE  ARTHROPLASTY;  Surgeon:               Barbarann Oneil BROCKS, MD;  Location: [facility] OR;  Service:               Orthopedics;  Laterality: Left; No date: UMBILICAL HERNIA REPAIR  BMI    Body Mass Index: 36.17 kg/m      Reproductive/Obstetrics negative OB ROS                              Anesthesia Physical Anesthesia Plan  ASA: 2  Anesthesia Plan: General ETT   Post-op Pain Management:    Induction: Intravenous  PONV Risk Score and Plan: Ondansetron , Dexamethasone , Midazolam , Propofol  infusion and TIVA  Airway Management Planned: Oral ETT  Additional Equipment:   Intra-op Plan:   Post-operative Plan: Extubation in OR  Informed Consent: I have reviewed the patients History and Physical, chart, labs and discussed the procedure including the risks, benefits and alternatives for the proposed anesthesia with the patient or authorized representative who has indicated his/her understanding and acceptance.     Dental Advisory Given  Plan Discussed with: Anesthesiologist, CRNA and Surgeon  Anesthesia Plan Comments: (Patient consented for risks of anesthesia including but not limited to:  - adverse reactions to medications - damage to eyes, teeth, lips or other oral mucosa - nerve damage due to positioning  - sore throat or hoarseness - Damage to heart, brain, nerves,  lungs, other parts of body or loss of life  Patient voiced understanding and assent.)        Anesthesia Quick Evaluation

## 2023-12-07 NOTE — Transfer of Care (Signed)
 Immediate Anesthesia Transfer of Care Note  Patient: VANNIE HOCHSTETLER  Procedure(s) Performed: T1-T4 Posterior Spinal instrumentation and Fusion, T3 Transpedicular decompression, Additonal Lamionectomies (Spine Thoracic) APPLICATION OF INTRAOPERATIVE CT SCAN (Back)  Patient Location: PACU  Anesthesia Type:General  Level of Consciousness: drowsy  Airway & Oxygen Therapy: Patient Spontanous Breathing and Patient connected to face mask oxygen  Post-op Assessment: Report given to RN, Post -op Vital signs reviewed and stable, and Patient moving all extremities  Post vital signs: Reviewed and stable  Last Vitals:  Vitals Value Taken Time  BP 94/79 12/07/23 19:45  Temp    Pulse 114 12/07/23 19:47  Resp 26 12/07/23 19:47  SpO2 100 % 12/07/23 19:47  Vitals shown include unfiled device data.  Last Pain:  Vitals:   12/07/23 1431  TempSrc:   PainSc: 5          Complications: No notable events documented.

## 2023-12-07 NOTE — Progress Notes (Signed)
  PROGRESS NOTE    Corey [name]  FMW:[id number] DOB: 01/17/1966 DOA: 12/06/2023 PCP: Joyce Norleen BROCKS, MD  ARPO/None  LOS: 1 day   Brief hospital course:   Assessment & Plan: [name] is a 58 year old male with history of thoracic myelopathy who presents emergency department for progressive weakness.   He was evaluated by neurosurgery outpatient and was sent to the emergency department for expedited admission and treatment of severe progressive thoracic myelopathy.   * Thoracic myelopathy Spinal stenosis Progressive thoracic myelopathy with high risk spinal cord injury with reperfusion. --plans for OR today for T1-T4 posterior spinal instrumentation and fusion, T3 transpedicular decompression, additional laminectomies  Depression --cont home paroxetine   Morbid obesity (HCC), BMI 36 This complicates overall care and prognosis.   OSA on CPAP CPAP nightly   DVT prophylaxis: SCD/Compression stockings Code Status: Full code  Family Communication:  Level of care: Progressive Dispo:   The patient is from: home Anticipated d/c is to: home Anticipated d/c date is: 2-3 days   Subjective and Interval History:  Pt reported no pain, just weakness.  Going for neuro-surgery today.   Objective: Vitals:   12/07/23 0653 12/07/23 0815 12/07/23 1108 12/07/23 1431  BP:  125/88 124/80 135/85  Pulse:  87 72 75  Resp:    15  Temp: 98.8 F (37.1 C) 99.2 F (37.3 C) 98.4 F (36.9 C) 97.7 F (36.5 C)  TempSrc: Oral Oral    SpO2:  96% 97% 100%  Weight:    111.1 kg  Height:    5' 9 (1.753 m)   No intake or output data in the 24 hours ending 12/07/23 1739 Filed Weights   12/06/23 1649 12/06/23 1654 12/07/23 1431  Weight: 111.1 kg 111.1 kg 111.1 kg    Examination:   Constitutional: NAD, AAOx3 HEENT: conjunctivae and lids normal, EOMI CV: No cyanosis.   RESP: normal respiratory effort, on RA Neuro: II - XII grossly intact.   Psych: Normal mood and affect.   Appropriate judgement and reason   Data Reviewed: I have personally reviewed labs and imaging studies  Time spent: 35 minutes  Ellouise Haber, MD [facility] If 7PM-7AM, please contact night-coverage 12/07/2023, 5:39 PM

## 2023-12-08 ENCOUNTER — Encounter: Payer: Self-pay | Admitting: Neurosurgery

## 2023-12-08 DIAGNOSIS — M4714 Other spondylosis with myelopathy, thoracic region: Secondary | ICD-10-CM | POA: Diagnosis not present

## 2023-12-08 LAB — BASIC METABOLIC PANEL WITH GFR
Anion gap: 8 (ref 5–15)
BUN: 15 mg/dL (ref 6–20)
CO2: 26 mmol/L (ref 22–32)
Calcium: 8.5 mg/dL — ABNORMAL LOW (ref 8.9–10.3)
Chloride: 105 mmol/L (ref 98–111)
Creatinine, Ser: 1.19 mg/dL (ref 0.61–1.24)
GFR, Estimated: >60 mL/min (ref >60)
Glucose, Bld: 160 mg/dL — ABNORMAL HIGH (ref 70–99)
Potassium: 4.6 mmol/L (ref 3.5–5.1)
Sodium: 139 mmol/L (ref 135–145)

## 2023-12-08 LAB — CBC
HCT: 37.8 % — ABNORMAL LOW (ref 39.0–52.0)
Hemoglobin: 12 g/dL — ABNORMAL LOW (ref 13.0–17.0)
MCH: 27.3 pg (ref 26.0–34.0)
MCHC: 31.7 g/dL (ref 30.0–36.0)
MCV: 86.1 fL (ref 80.0–100.0)
Platelets: 240 K/uL (ref 150–400)
RBC: 4.39 MIL/uL (ref 4.22–5.81)
RDW: 14 % (ref 11.5–15.5)
WBC: 9.7 K/uL (ref 4.0–10.5)
nRBC: 0 % (ref 0.0–0.2)

## 2023-12-08 LAB — MAGNESIUM: Magnesium: 1.9 mg/dL (ref 1.7–2.4)

## 2023-12-08 NOTE — Evaluation (Signed)
 Physical Therapy Evaluation Patient Details Name: Corey [name] MRN: [id number] DOB: 08-30-1965 Today's Date: 12/08/2023  History of Present Illness  Pt is a 58 yo male  s/p emergent T3 transpedicular decompression T1-4 PSF. PMH of back surgery, TKA, hernia repair, HTN, sleep apnea.  Clinical Impression  Patient A&Ox4, on [place] at 2L. Trialed on room air, spO2 >92% throughout, RN notified and pt left on room air. Pt stated recently due to progressive weakness he has had daily falls, and was using a RW/rollator. Prior to this was fully independent, worked full time at [employer].   Pt instructed in log roll, required minA to complete. Fair sitting balance for several minutes. Assistance needed (maxA) to don socks. Sit <> stand with RW and minA twice. Very effortful for pt. He ambulated ~49ft with close chair follow, CGA, twice as well. Noted for instability, intermittent BLE buckling/weakness noted, HR in 120s, reliant on RW. Pt is very motivated to maximize and return to PLOF with supportive family at bedside.  Overall the patient demonstrated deficits (see PT Problem List) that impede the patient's functional abilities, safety, and mobility and would benefit from skilled PT intervention, >3hrs of therapy at next venue to maximize outcomes.          If plan is discharge home, recommend the following: A little help with walking and/or transfers;A little help with bathing/dressing/bathroom;Assistance with cooking/housework;Assist for transportation;Help with stairs or ramp for entrance   Can travel by private vehicle        Equipment Recommendations None recommended by PT  Recommendations for Other Services       Functional Status Assessment Patient has had a recent decline in their functional status and demonstrates the ability to make significant improvements in function in a reasonable and predictable amount of time.     Precautions / Restrictions Precautions Precautions: Fall Recall  of Precautions/Restrictions: Intact Precaution/Restrictions Comments: BLTs Restrictions Weight Bearing Restrictions Per Provider Order: No      Mobility  Bed Mobility Overal bed mobility: Needs Assistance Bed Mobility: Rolling, Sidelying to Sit Rolling: Min assist, Used rails Sidelying to sit: Min assist, Used rails            Transfers Overall transfer level: Needs assistance Equipment used: Rolling walker (2 wheels) Transfers: Sit to/from Stand Sit to Stand: Min assist           General transfer comment: RW steadying, light steadying for balance    Ambulation/Gait Ambulation/Gait assistance: Contact guard assist Gait Distance (Feet):  (2 bouts of ~64ft) Assistive device: Rolling walker (2 wheels)   Gait velocity: decreased     General Gait Details: chair follow. noted for intermittent BLE buckling/weakness. HR 120s. reliant on RW for balance. pt very motivated to increase distance as able  Acupuncturist Bed    Modified Rankin (Stroke Patients Only)       Balance Overall balance assessment: Needs assistance Sitting-balance support: Feet supported Sitting balance-Leahy Scale: Fair     Standing balance support: Bilateral upper extremity supported, During functional activity, Reliant on assistive device for balance Standing balance-Leahy Scale: Poor                               Pertinent Vitals/Pain Pain Assessment Pain Assessment: 0-10 Pain Score: 3  Pain Location: thoracic area Pain Descriptors / Indicators: Sore, Spasm Pain  Intervention(s): Limited activity within patient's tolerance, Monitored during session, Repositioned    Home Living Family/patient expects to be discharged to:: Private residence Living Arrangements: Spouse/significant other Available Help at Discharge: Family Type of Home: House Home Access: Stairs to enter Entrance Stairs-Rails: None Entrance Stairs-Number of  Steps: 2   Home Layout: One level Home Equipment: Agricultural consultant (2 wheels);Cane - single point;Rollator (4 wheels);BSC/3in1 Additional Comments: mutiple daily falls due to progressive leg weakness    Prior Function Prior Level of Function : Working/employed;Driving;Independent/Modified Independent             Mobility Comments: prior to onset of weakness, independent, working full time. in the last week started using rollator ADLs Comments: previously modI, just recently started to need assistance     Extremity/Trunk Assessment   Upper Extremity Assessment Upper Extremity Assessment: Defer to OT evaluation (limited shoulder ROM due to pain, grip strength functional and symmetrical, denied light touch sensation deficits)    Lower Extremity Assessment Lower Extremity Assessment:  (pt able to march, LAQ and DF/PF, true MMT deferred due to pain. more difficutly with LLE coordination. denied light touch sensation deficits)       Communication        Cognition Arousal: Alert Behavior During Therapy: WFL for tasks assessed/performed   PT - Cognitive impairments: No apparent impairments                         Following commands: Intact       Cueing Cueing Techniques: Verbal cues, Tactile cues, Visual cues     General Comments      Exercises     Assessment/Plan    PT Assessment Patient needs continued PT services  PT Problem List Decreased strength;Pain;Decreased range of motion;Decreased activity tolerance;Decreased knowledge of use of DME;Decreased balance;Decreased safety awareness;Decreased mobility;Decreased knowledge of precautions       PT Treatment Interventions DME instruction;Balance training;Gait training;Neuromuscular re-education;Stair training;Functional mobility training;Patient/family education;Therapeutic activities;Therapeutic exercise    PT Goals (Current goals can be found in the Care Plan section)  Acute Rehab PT Goals Patient  Stated Goal: to return to PLOF PT Goal Formulation: With patient Time For Goal Achievement: 12/22/23 Potential to Achieve Goals: Good    Frequency 7X/week     Co-evaluation               AM-PAC PT 6 Clicks Mobility  Outcome Measure Help needed turning from your back to your side while in a flat bed without using bedrails?: A Lot Help needed moving from lying on your back to sitting on the side of a flat bed without using bedrails?: A Lot Help needed moving to and from a bed to a chair (including a wheelchair)?: A Little Help needed standing up from a chair using your arms (e.g., wheelchair or bedside chair)?: A Little Help needed to walk in hospital room?: A Little Help needed climbing 3-5 steps with a railing? : A Lot 6 Click Score: 15    End of Session   Activity Tolerance: Patient tolerated treatment well Patient left: in chair;with call bell/phone within reach;with chair alarm set Nurse Communication: Mobility status PT Visit Diagnosis: Other abnormalities of gait and mobility (R26.89);Difficulty in walking, not elsewhere classified (R26.2);Muscle weakness (generalized) (M62.81);Pain Pain - Right/Left:  (midline) Pain - part of body:  (thoracic)    Time: 9075-8991 PT Time Calculation (min) (ACUTE ONLY): 44 min   Charges:   PT Evaluation $PT Eval Low Complexity: 1 Low  PT Treatments $Therapeutic Activity: 38-52 mins PT General Charges $$ ACUTE PT VISIT: 1 Visit        Doyal Shams PT, DPT 12:36 PM,12/08/23

## 2023-12-08 NOTE — Progress Notes (Signed)
  PROGRESS NOTE    Corey [name]  FMW:[id number] DOB: 1965-07-30 DOA: 12/06/2023 PCP: Joyce Norleen BROCKS, MD  [id number]  LOS: 2 days   Brief hospital course:   Assessment & Plan: [name] is a 58 year old male with history of thoracic myelopathy who presented to emergency department for progressive weakness.   He was evaluated by neurosurgery outpatient and was sent to the emergency department for expedited admission and treatment of severe progressive thoracic myelopathy.   * Thoracic myelopathy Spinal stenosis s/p T3 transpedicular decompression T1-4 PSF on 7/8 Progressive thoracic myelopathy with high risk spinal cord injury with reperfusion.  Pt is s/p surgery and tolerated it well. --cont HV and wound vac. --PT/OT  Depression --cont home paroxetine   Morbid obesity (HCC), BMI 36 This complicates overall care and prognosis.   OSA on CPAP CPAP nightly  Brief AMS --early afternoon, rapid response called when pt became less responsive, HR dropped to 40's, and incontinent of urine, when pt leaned forward during drain repositioning.  Pt reported feeling faint and nauseated during the episode.  By the time of rapid response arrival, pt was feeling back to baseline, with stable vitals. --likely vasovagal  --monitor on tele   DVT prophylaxis: SCD/Compression stockings Code Status: Full code  Family Communication: wife updated at bedside today Level of care: Med-Surg Dispo:   The patient is from: home Anticipated d/c is to: home Anticipated d/c date is: 2-3 days, when neurosurgery clears.   Subjective and Interval History:  Pt reported improvement in strength during rounds this morning.    Later in the day, rapid response called when pt became less responsive, HR dropped to 40's, and incontinent of urine, when pt leaned forward.  Pt reported feeling faint and nauseated during the episode.  By the time of rapid response arrival, pt was feeling back to baseline, with  stable vitals.   Objective: Vitals:   12/08/23 0400 12/08/23 0747 12/08/23 1133 12/08/23 1524  BP: 115/74  130/77 121/68  Pulse: 84     Resp: 12     Temp: 98.2 F (36.8 C) 98.2 F (36.8 C) 98.9 F (37.2 C) 98.6 F (37 C)  TempSrc: Oral Oral Oral Oral  SpO2: 94%   100%  Weight:      Height:        Intake/Output Summary (Last 24 hours) at 12/08/2023 1705 Last data filed at 12/08/2023 1500 Gross per 24 hour  Intake 1820 ml  Output 1880 ml  Net -60 ml   Filed Weights   12/06/23 1654 12/07/23 1431 12/07/23 2048  Weight: 111.1 kg 111.1 kg 108.4 kg    Examination:   Constitutional: NAD, AAOx3 HEENT: conjunctivae and lids normal, EOMI CV: No cyanosis.   RESP: normal respiratory effort, on RA Neuro: II - XII grossly intact.   Psych: Normal mood and affect.  Appropriate judgement and reason  Surgical drain connected to wound vac    Data Reviewed: I have personally reviewed labs and imaging studies  Time spent: 50 minutes  Ellouise Haber, MD [facility] If 7PM-7AM, please contact night-coverage 12/08/2023, 5:05 PM

## 2023-12-08 NOTE — Anesthesia Postprocedure Evaluation (Signed)
 Anesthesia Post Note  Patient: Corey [name]  Procedure(s) Performed: T1-T4 Posterior Spinal instrumentation and Fusion, T3 Transpedicular decompression, Additonal Lamionectomies (Spine Thoracic) APPLICATION OF INTRAOPERATIVE CT SCAN (Back)  Patient location during evaluation: PACU Anesthesia Type: General Level of consciousness: awake and alert Pain management: pain level controlled Vital Signs Assessment: post-procedure vital signs reviewed and stable Respiratory status: spontaneous breathing, nonlabored ventilation and respiratory function stable Cardiovascular status: blood pressure returned to baseline and stable Postop Assessment: no apparent nausea or vomiting Anesthetic complications: no   No notable events documented.   Last Vitals:  Vitals:   12/07/23 2300 12/08/23 0000  BP: 124/86 122/79  Pulse: (!) 113 (!) 103  Resp: (!) 22 12  Temp:    SpO2: 96% 97%    Last Pain:  Vitals:   12/07/23 2156  TempSrc: Oral  PainSc:                  Fairy POUR [place] Bejar

## 2023-12-08 NOTE — PMR Pre-admission (Signed)
 PMR Admission Coordinator Pre-Admission Assessment  Patient: Corey [name] is an 58 y.o., male MRN: [id number] DOB: Nov 07, 1965 Height: 5' 9 (175.3 cm) Weight: 108.4 kg  Insurance Information HMO:     PPO: yes     PCP:      IPA:      80/20:      OTHER:  PRIMARY: [employer] [place] PPO      Policy#: [id number]      Subscriber: Pt    Patient approved for admisison on 12/09/23 for admit 12/10/23-12/13/23 CM Name:       Phone#: [phone]     Fax#:  [phone] (please confirm if concurrent fax is still [phone]) Pre-Cert#: [id number]      Employer:  Benefits:  Phone #:      Name:  Eustacio Date: 06/02/2023 - 05/31/9998 Deductible: does not have one OOP Max: $7,500 ($520.16 met) CIR: $350/day co-pay/admission for days 1-5 SNF: no coverage Outpatient:  $35 co-pay/visit Home Health:  70% coverage, 30% co-insurance DME: 70% coverage, 30% co-insurance Providers: in network    SECONDARY:       Policy#:      Phone#:   Artist:       Phone#:   The Data processing manager" for patients in Inpatient Rehabilitation Facilities with attached "Privacy Act Statement-Health Care Records" was provided and verbally reviewed with: Patient  Emergency Contact Information Contact Information     Name Relation Home Work Mobile   Fee,[name] Spouse   [phone]      Other Contacts   None on File     Current Medical History  Patient Admitting Diagnosis: Thoracic Myelopathy History of Present Illness: Corey [name] is a 58 year old male with history of thoracic myelopathy who presented to [facility]  emergency department 12/06/23 for progressive weakness. He was evaluated by neurosurgery outpatient and was sent to the emergency department for expedited admission and treatment of severe progressive thoracic myelopathy. Vitals in the ED showed temperature of 99.4, respiration rate of 18, heart rate 80, blood pressure 132/91, SpO2 99% on room air.He denies  trauma to his person.  He reports he has been having bilateral lower extremity weakness for about 1 month.  He reports it has progressively worsened.On 12/07/23, Pt.  Underwent emergent T3 transpedicular decompression T1-4 PSF. He was seen by PT/OT and they recommended CIR to assist return to PLOF.     Serum sodium is 140, potassium 3.9, chloride 103, bicarb 27, BUN of 15, serum creatinine 1.14, EGFR greater than 60, nonfasting blood glucose 89, WBC 6.5, hemoglobin 13.3, platelets of 263.    Patient's medical record from [facility]  has been reviewed by the rehabilitation admission coordinator and physician.  Past Medical History  Past Medical History:  Diagnosis Date   Arthritis    knee, left   ED (erectile dysfunction)    Hyperlipidemia    Hypertension    Sleep apnea    CPAP - not using lately due to recall     Has the patient had major surgery during 100 days prior to admission? Yes  Family History   family history includes Alzheimer's disease in his mother; Autism in his brother; Colon cancer in an other family member; Stevens-Johnson syndrome in his father.  Current Medications  Current Facility-Administered Medications:    0.9 %  sodium chloride  infusion, 250 mL, Intravenous, Continuous, Gregory Edsel Ruth, PA   acetaminophen  (TYLENOL ) tablet 650 mg, 650 mg, Oral, Q4H PRN **OR** acetaminophen  (TYLENOL ) suppository  650 mg, 650 mg, Rectal, Q4H PRN, Gregory Edsel Ruth, PA   acetaminophen  (TYLENOL ) tablet 1,000 mg, 1,000 mg, Oral, Q6H, Gregory Edsel Ruth, PA, 1,000 mg at 12/08/23 1231   docusate sodium  (COLACE) capsule 100 mg, 100 mg, Oral, BID, Gregory Edsel Ruth, PA, 100 mg at 12/08/23 0935   enoxaparin  (LOVENOX ) injection 40 mg, 40 mg, Subcutaneous, Q24H, Gregory Edsel Ruth, [place], 40 mg at 12/08/23 0935   magnesium  citrate solution 1 Bottle, 1 Bottle, Oral, Once PRN, Gregory Edsel Ruth, PA   menthol -cetylpyridinium (CEPACOL) lozenge 3 mg, 1 lozenge, Oral, PRN  **OR** phenol (CHLORASEPTIC) mouth spray 1 spray, 1 spray, Mouth/Throat, PRN, Gregory Edsel Ruth, PA   methocarbamol  (ROBAXIN ) tablet 500 mg, 500 mg, Oral, Q6H PRN **OR** methocarbamol  (ROBAXIN ) injection 500 mg, 500 mg, Intravenous, Q6H PRN, Gregory Edsel Ruth, PA   mupirocin  ointment (BACTROBAN ) 2 % 1 Application, 1 Application, Nasal, BID, [place], MD, 1 Application at 12/08/23 0935   ondansetron  (ZOFRAN ) tablet 4 mg, 4 mg, Oral, Q6H PRN **OR** ondansetron  (ZOFRAN ) injection 4 mg, 4 mg, Intravenous, Q6H PRN, Gregory Edsel Ruth, PA   oxyCODONE  (Oxy IR/ROXICODONE ) immediate release tablet 10 mg, 10 mg, Oral, Q3H PRN, Gregory Edsel Ruth, PA   oxyCODONE  (Oxy IR/ROXICODONE ) immediate release tablet 5 mg, 5 mg, Oral, Q3H PRN, Gregory Edsel Ruth, PA   PARoxetine  (PAXIL ) tablet 20 mg, 20 mg, Oral, Daily, Cox, Amy N, DO, 20 mg at 12/08/23 0935   polyethylene glycol (MIRALAX  / GLYCOLAX ) packet 17 g, 17 g, Oral, Daily PRN, Gregory Edsel Ruth, PA   senna (SENOKOT) tablet 8.6 mg, 1 tablet, Oral, BID, Gregory Edsel Ruth, PA, 8.6 mg at 12/08/23 0935   sodium chloride  flush (NS) 0.9 % injection 3 mL, 3 mL, Intravenous, Q12H, Gregory Edsel Ruth, PA, 3 mL at 12/08/23 0800   sodium chloride  flush (NS) 0.9 % injection 3 mL, 3 mL, Intravenous, PRN, Gregory Edsel Ruth, PA   sorbitol  70 % solution 30 mL, 30 mL, Oral, Daily PRN, Gregory Edsel Ruth, PA  Patients Current Diet:  Diet Order             Diet regular Room service appropriate? Yes; Fluid consistency: Thin  Diet effective now                   Precautions / Restrictions Precautions Precautions: Fall, Back Precaution Booklet Issued: Yes (comment) Precaution/Restrictions Comments: spinal precautions, no brace needed per orders Restrictions Weight Bearing Restrictions Per Provider Order: No   Has the patient had 2 or more falls or a fall with injury in the past year? Yes  Prior Activity Level  Pt working full time PTA  Prior Functional  Level Self Care: Did the patient need help bathing, dressing, using the toilet or eating? Independent  Indoor Mobility: Did the patient need assistance with walking from room to room (with or without device)? Independent  Stairs: Did the patient need assistance with internal or external stairs (with or without device)? Independent  Functional Cognition: Did the patient need help planning regular tasks such as shopping or remembering to take medications? Independent  Patient Information Are you of Hispanic, Latino/a,or Spanish origin?: A. No, not of Hispanic, Latino/a, or Spanish origin What is your race?: B. Black or African American Do you need or want an interpreter to communicate with a doctor or health care staff?: 0. No  Patient's Response To:  Health Literacy and Transportation Is the patient able to respond to health literacy and transportation needs?: Yes  Health Literacy - How often do you need to have someone help you when you read instructions, pamphlets, or other written material from your doctor or pharmacy?: Never In the past 12 months, has lack of transportation kept you from medical appointments or from getting medications?: No In the past 12 months, has lack of transportation kept you from meetings, work, or from getting things needed for daily living?: No  Home Assistive Devices / Equipment Home Equipment: Agricultural consultant (2 wheels), [employer] - single point, Rollator (4 wheels), BSC/3in1, Shower seat  Prior Device Use: Indicate devices/aids used by the patient prior to current illness, exacerbation or injury? Manual wheelchair and Walker  Current Functional Level Cognition  Orientation Level: Oriented X4    Extremity Assessment (includes Sensation/Coordination)  Upper Extremity Assessment: RUE deficits/detail, Right hand dominant, LUE deficits/detail RUE Deficits / Details: limited in assessment by pain with AROM, did not formally assess MMT RUE: Unable to fully assess  due to pain, Shoulder pain with ROM RUE Sensation: WNL LUE: Unable to fully assess due to pain, Shoulder pain with ROM LUE Sensation: WNL  Lower Extremity Assessment: Defer to PT evaluation    ADLs  Overall ADL's : Needs assistance/impaired Lower Body Dressing: Sit to/from stand, Moderate assistance, Sitting/lateral leans, Cueing for sequencing, Cueing for safety, With adaptive equipment Lower Body Dressing Details (indicate cue type and reason): educated pt on use of AE for LB dressing with pt and spouse verbalizing understanding Toilet Transfer: Minimal assistance, Rolling walker (2 wheels), BSC/3in1 Toileting- Clothing Manipulation and Hygiene: Maximal assistance, Sit to/from stand, Cueing for sequencing, Cueing for safety, Adhering to back precautions Functional mobility during ADLs: Minimal assistance, Rolling walker (2 wheels), Cueing for sequencing, Cueing for safety General ADL Comments: pt significantly far from baseline, anticipate up to MAX A for LB ADLs    Mobility  Overal bed mobility: Needs Assistance Bed Mobility: Rolling, Sidelying to Sit Rolling: Min assist, Used rails Sidelying to sit: Min assist, Used rails General bed mobility comments: NT, pt recieved and left in recliner    Transfers  Overall transfer level: Needs assistance Equipment used: Rolling walker (2 wheels) Transfers: Sit to/from Stand Sit to Stand: Min assist General transfer comment: cues for technique, steadying assist    Ambulation / Gait / Stairs / Wheelchair Mobility  Ambulation/Gait Ambulation/Gait assistance: Contact guard assist Gait Distance (Feet):  (2 bouts of ~25ft) Assistive device: Rolling walker (2 wheels) General Gait Details: chair follow. noted for intermittent BLE buckling/weakness. HR 120s. reliant on RW for balance. pt very motivated to increase distance as able Gait velocity: decreased    Posture / Balance Balance Overall balance assessment: Needs assistance Sitting-balance  support: Feet supported Sitting balance-Leahy Scale: Fair Standing balance support: Bilateral upper extremity supported, During functional activity, Reliant on assistive device for balance Standing balance-Leahy Scale: Poor    Special needs/care consideration Skin surgical incision   Previous Home Environment (from acute therapy documentation) Living Arrangements: Spouse/significant other Available Help at Discharge: Family, Available 24 hours/day Type of Home: House Home Layout: One level Home Access: Stairs to enter Entrance Stairs-Rails: None Entrance Stairs-Number of Steps: 2 Bathroom Shower/Tub: Health visitor: Standard Bathroom Accessibility: Yes How Accessible: Accessible via walker Home Care Services: No Additional Comments: mutiple daily falls due to progressive leg weakness,  10+ since beginning of June  Discharge Living Setting Plans for Discharge Living Setting: Patient's home Type of Home at Discharge: House Discharge Home Layout: One level Discharge Home Access: Stairs to enter Entrance Stairs-Rails: None  Entrance Stairs-Number of Steps: 2 Discharge Bathroom Shower/Tub: Tub/shower unit Discharge Bathroom Toilet: Standard Discharge Bathroom Accessibility: Yes How Accessible: Other (comment), Accessible via walker Does the patient have any problems obtaining your medications?: No  Social/Family/Support Systems Patient Roles: Spouse Contact Information: [phone] Anticipated Caregiver: Princella Ability/Limitations of Caregiver: 24/7 min a Caregiver Availability: 24/7 Discharge Plan Discussed with Primary Caregiver: Yes Is Caregiver In Agreement with Plan?: No Does Caregiver/Family have Issues with Lodging/Transportation while Pt is in Rehab?: No  Goals Patient/Family Goal for Rehab: PT/OT mod I Expected length of stay: 10-12 days Pt/Family Agrees to Admission and willing to participate: Yes Program Orientation Provided & Reviewed with  Pt/Caregiver Including Roles  & Responsibilities: Yes  Decrease burden of Care through IP rehab admission: Not anticipated  Possible need for SNF placement upon discharge: Not anticipated  Patient Condition: I have reviewed medical records from [facility], spoken with CM, and patient and spouse. I discussed via phone for inpatient rehabilitation assessment.  Patient will benefit from ongoing PT and OT, can actively participate in 3 hours of therapy a day 5 days of the week, and can make measurable gains during the admission.  Patient will also benefit from the coordinated team approach during an Inpatient Acute Rehabilitation admission.  The patient will receive intensive therapy as well as Rehabilitation physician, nursing, social worker, and care management interventions.  Due to safety, skin/wound care, disease management, medication administration, pain management, and patient education the patient requires 24 hour a day rehabilitation nursing.  The patient is currently Min A +2 with mobility and Mod A with basic ADLs.  Discharge setting and therapy post discharge at home with home health is anticipated.  Patient has agreed to participate in the Acute Inpatient Rehabilitation Program and will admit today.  Preadmission Screen Completed By:  Leita KATHEE Kleine, 12/08/2023 1:31 PM ______________________________________________________________________   Discussed status with Dr. Emeline on 12/10/23  at 10:22 AM and received approval for admission today.  Admission Coordinator:  Leita KATHEE Kleine, CCC-SLP, time 10:22 AM/Date 12/10/23    Assessment/Plan: Diagnosis: Thoracic myelopathy status post T1-4 PSF 7-8 Does the need for close, 24 hr/day Medical supervision in concert with the patient's rehab needs make it unreasonable for this patient to be served in a less intensive setting? Yes Co-Morbidities requiring supervision/potential complications: Complex postoperative wound with wound  VAC, anemia of chronic disease, OSA on CPAP, morbid obesity, depression, poor pain control, constipation, hyperglycemia Due to bladder management, bowel management, safety, skin/wound care, disease management, medication administration, pain management, and patient education, does the patient require 24 hr/day rehab nursing? Yes Does the patient require coordinated care of a physician, rehab nurse, PT, OT  to address physical and functional deficits in the context of the above medical diagnosis(es)? Yes Addressing deficits in the following areas: balance, endurance, locomotion, strength, transferring, bowel/bladder control, bathing, dressing, feeding, grooming, and toileting Can the patient actively participate in an intensive therapy program of at least 3 hrs of therapy 5 days a week? Yes The potential for patient to make measurable gains while on inpatient rehab is good Anticipated functional outcomes upon discharge from inpatient rehab: modified independent PT, modified independent OT Estimated rehab length of stay to reach the above functional goals is: 10-12 days Anticipated discharge destination: Home 10. Overall Rehab/Functional Prognosis: good   MD Signature:  Joesph JAYSON Emeline, DO 12/10/2023

## 2023-12-08 NOTE — Progress Notes (Signed)
   Neurosurgery Progress Note  History: Corey [name] is s/p T3 transpedicular decompression T1-4 PSF  POD1: pt continues to have some spasms in his legs overnight but feels his strength is somewhat better and the numbness in his legs has improved  Physical Exam: Vitals:   12/08/23 0400 12/08/23 0747  BP: 115/74   Pulse: 84   Resp: 12   Temp: 98.2 F (36.8 C) 98.2 F (36.8 C)  SpO2: 94%     AA Ox3 CNI  Side Iliopsoas Quads Hamstring PF DF EHL   R 3 4- 5 4 4-  4-  L 4 4+ 5 4+ 4+  4-   HV 180 since surgery   Data:  Other tests/results: see results review  Assessment/Plan:  Corey STOLP is a 58 y.o presenting with progressive thoracic myelopathy s/p T3 transpedicular decompression T1-4 PSF   - mobilize - pain control - DVT prophylaxis - will continue HV for now - will continue wound vac. Will come interrogate later due to small leak however vac appears to be to suction.  - PTOT - remainder of care per primary team  Edsel Goods PA-C Department of Neurosurgery

## 2023-12-08 NOTE — Evaluation (Signed)
 Occupational Therapy Evaluation Patient Details Name: Corey [name] MRN: [id number] DOB: [birth year] Today's Date: 12/08/2023   History of Present Illness   Pt is a 58 yo male  s/p emergent T3 transpedicular decompression T1-4 PSF. PMH of back surgery, TKA, hernia repair, HTN, sleep apnea.     Clinical Impressions Pt admitted with above diagnosis. Pt typically independent in ADL/IADL/mobility performance but has required increased levels of physical assist since onset of BLE weakness beginning in June 2025. Pt lives with spouse who can provide support upon hospital discharge. Pt requires MIN A to rise from recliner using RW, functional mobility using RW t/f sink in room with MIN A, and anticipate pt will require up to MAX A for LB ADLs. Educated on back precautions and AE for LB ADLs to increase independence. Pt is motivated, but fatigues quickly and mobility requires extensive effort. Pt is far from his functional baseline and will benefit from intensive inpatient follow-up therapy, >3 hours/day.      If plan is discharge home, recommend the following:   A little help with walking and/or transfers;A little help with bathing/dressing/bathroom;Assistance with cooking/housework;Direct supervision/assist for medications management     Functional Status Assessment   Patient has had a recent decline in their functional status and demonstrates the ability to make significant improvements in function in a reasonable and predictable amount of time.     Equipment Recommendations   None recommended by OT;Tub/shower bench (defer to next LOC)      Precautions/Restrictions   Precautions Precautions: Fall;Back Precaution Booklet Issued: Yes (comment) Recall of Precautions/Restrictions: Intact Precaution/Restrictions Comments: spinal precautions, no brace needed per orders Restrictions Weight Bearing Restrictions Per Provider Order: No     Mobility Bed Mobility Overal bed mobility:  Needs Assistance             General bed mobility comments: NT, pt recieved and left in recliner    Transfers Overall transfer level: Needs assistance Equipment used: Rolling walker (2 wheels) Transfers: Sit to/from Stand Sit to Stand: Min assist           General transfer comment: cues for technique, steadying assist      Balance Overall balance assessment: Needs assistance Sitting-balance support: Feet supported Sitting balance-Leahy Scale: Fair     Standing balance support: Bilateral upper extremity supported, During functional activity, Reliant on assistive device for balance Standing balance-Leahy Scale: Poor                             ADL either performed or assessed with clinical judgement   ADL Overall ADL's : Needs assistance/impaired                     Lower Body Dressing: Sit to/from stand;Moderate assistance;Sitting/lateral leans;Cueing for sequencing;Cueing for safety;With adaptive equipment Lower Body Dressing Details (indicate cue type and reason): educated pt on use of AE for LB dressing with pt and spouse verbalizing understanding Toilet Transfer: Minimal assistance;Rolling walker (2 wheels);BSC/3in1   Toileting- Clothing Manipulation and Hygiene: Maximal assistance;Sit to/from stand;Cueing for sequencing;Cueing for safety;Adhering to back precautions       Functional mobility during ADLs: Minimal assistance;Rolling walker (2 wheels);Cueing for sequencing;Cueing for safety General ADL Comments: pt significantly far from baseline, anticipate up to MAX A for LB ADLs      Pertinent Vitals/Pain Pain Assessment Pain Assessment: 0-10 Pain Score: 3  Pain Location: thoracic area Pain Descriptors / Indicators: Sore, Spasm Pain Intervention(s):  Limited activity within patient's tolerance, Monitored during session, Repositioned     Extremity/Trunk Assessment Upper Extremity Assessment Upper Extremity Assessment: RUE  deficits/detail;Right hand dominant;LUE deficits/detail RUE Deficits / Details: limited in assessment by pain with AROM, did not formally assess MMT RUE: Unable to fully assess due to pain;Shoulder pain with ROM RUE Sensation: WNL LUE: Unable to fully assess due to pain;Shoulder pain with ROM LUE Sensation: WNL   Lower Extremity Assessment Lower Extremity Assessment: Defer to PT evaluation       Communication Communication Communication: No apparent difficulties   Cognition Arousal: Alert Behavior During Therapy: WFL for tasks assessed/performed Cognition: No apparent impairments                               Following commands: Intact       Cueing  General Comments   Cueing Techniques: Verbal cues;Tactile cues;Visual cues  HR up to 130s with mobility, returned down to 90-100s at rest. Wound vac and drain intact start / end of session   Exercises Other Exercises Other Exercises: edu on role of OT throughout continuum of care (acute, IPR, OPOT), spinal precautions, implications of functional mobility given restrictions, recommendation for therapy at hospital discharge   Shoulder Instructions      Home Living Family/patient expects to be discharged to:: Private residence Living Arrangements: Spouse/significant other Available Help at Discharge: Family;Available 24 hours/day Type of Home: House Home Access: Stairs to enter [employer] of Steps: 2 Entrance Stairs-Rails: None Home Layout: One level     Bathroom Shower/Tub: Producer, television/film/video: Standard     Home Equipment: Agricultural consultant (2 wheels);Cane - single point;Rollator (4 wheels);BSC/3in1;Shower seat   Additional Comments: mutiple daily falls due to progressive leg weakness,  10+ since beginning of June      Prior Functioning/Environment Prior Level of Function : Working/employed;Driving;Independent/Modified Independent             Mobility Comments: prior to  onset of weakness, independent, working full time. in the last week started using rollator ADLs Comments: prior to beginning of June, pt completely independent. pt now requires assist for most BADLs (showers, LB dressing) due to BLE weakness    OT Problem List: Decreased strength;Decreased range of motion;Decreased activity tolerance;Impaired balance (sitting and/or standing);Decreased coordination;Decreased knowledge of precautions;Impaired vision/perception   OT Treatment/Interventions: Self-care/ADL training;Therapeutic exercise;Modalities;Patient/family education;Balance training;Neuromuscular education;Energy conservation;Therapeutic activities;DME and/or AE instruction      OT Goals(Current goals can be found in the care plan section)   Acute Rehab OT Goals OT Goal Formulation: With patient Time For Goal Achievement: 12/22/23 Potential to Achieve Goals: Good   OT Frequency:  Min 3X/week    AM-PAC OT 6 Clicks Daily Activity     Outcome Measure Help from another person eating meals?: None Help from another person taking care of personal grooming?: A Little Help from another person toileting, which includes using toliet, bedpan, or urinal?: A Lot Help from another person bathing (including washing, rinsing, drying)?: A Lot Help from another person to put on and taking off regular upper body clothing?: A Lot Help from another person to put on and taking off regular lower body clothing?: A Lot 6 Click Score: 15   End of Session Equipment Utilized During Treatment: Gait belt;Rolling walker (2 wheels) Nurse Communication: Mobility status  Activity Tolerance: Patient tolerated treatment well Patient left: in chair;with call bell/phone within reach;with chair alarm set;with family/visitor present  OT  Visit Diagnosis: Unsteadiness on feet (R26.81);Other abnormalities of gait and mobility (R26.89);Repeated falls (R29.6);Muscle weakness (generalized) (M62.81);History of falling  (Z91.81)                Time: 8973-8944 OT Time Calculation (min): 29 min Charges:  OT General Charges $OT Visit: 1 Visit OT Evaluation $OT Eval Low Complexity: 1 Low OT Treatments $Self Care/Home Management : 8-22 mins  Ariadne Rissmiller L. Ulyssa Walthour, OTR/L  12/08/23, 1:21 PM

## 2023-12-08 NOTE — Progress Notes (Signed)
   Inpatient Rehab Admissions Coordinator :  Per therapy recommendations, patient was screened for CIR candidacy by Heron Leavell RN MSN.  At this time patient appears to be a potential candidate for CIR, but may progress rapidly as not to need CIR admit as CGA 2 bouts of 50 feet on eval. I will place a rehab consult per protocol for full assessment. Please call me with any questions.  Heron Leavell RN MSN Admissions Coordinator [phone]

## 2023-12-08 NOTE — Progress Notes (Signed)
  Chaplain On-Call responded to Rapid Response notification at 1337 hours.  The patient was receiving care at the bedside for stabilization of his condition.  Chaplain assured Staff of availability as needed.  Chaplain Bebe Ardean EMERSON Hershal., [facility]

## 2023-12-08 NOTE — Significant Event (Signed)
 Rapid Response Event Note   Reason for Call :  Near syncope  Initial Focused Assessment:  Rapid response RN arrived in patient's room with patient sitting up in the chair surrounded by 2A staff and neurosurgery PA. Per report from patient and staff, patient had stood/sat forward so the PA could check his dressings and patient became less responsive, diaphoretic, became incontinent of urine, and HR dropped to 40s. At time of rapid response arrival, patient alert and oriented, no reports of numbness or tingling, pupils reactive. HR 80s SR, BP 126/66 MAP 83, oxygen saturations 93% on room air, RR 18 and unlabored. Patient reported feeling light headed and nauseous at time of event but does not feel so anymore. He also reported he felt similar though not as intense when he worked with PT earlier.  Interventions:  None  Plan of Care:  Patient to remain on 2A for now. Patient educated with teach back to not try and get up and move around without staff present to assist for safety. 2A to reach back out rapid response team if any further needs.  Event Summary:   MD Notified: Dr. Awanda and Edsel Goods PA (neurosurgery) Call Time: 13:36 Arrival Time: 13:38 End Time: 13:47  GEORGINA LAURAINE NORRIS, RN

## 2023-12-09 DIAGNOSIS — F32A Depression, unspecified: Secondary | ICD-10-CM | POA: Diagnosis not present

## 2023-12-09 DIAGNOSIS — M4800 Spinal stenosis, site unspecified: Secondary | ICD-10-CM

## 2023-12-09 DIAGNOSIS — M4804 Spinal stenosis, thoracic region: Secondary | ICD-10-CM

## 2023-12-09 DIAGNOSIS — G4733 Obstructive sleep apnea (adult) (pediatric): Secondary | ICD-10-CM

## 2023-12-09 DIAGNOSIS — E785 Hyperlipidemia, unspecified: Secondary | ICD-10-CM | POA: Diagnosis not present

## 2023-12-09 DIAGNOSIS — M4714 Other spondylosis with myelopathy, thoracic region: Secondary | ICD-10-CM | POA: Diagnosis not present

## 2023-12-09 LAB — IRON AND TIBC
Iron: 19 ug/dL — ABNORMAL LOW (ref 45–182)
Saturation Ratios: 8 % — ABNORMAL LOW (ref 17.9–39.5)
TIBC: 237 ug/dL — ABNORMAL LOW (ref 250–450)
UIBC: 218 ug/dL

## 2023-12-09 LAB — RETICULOCYTES
Immature Retic Fract: 16.4 % — ABNORMAL HIGH (ref 2.3–15.9)
RBC.: 3.81 MIL/uL — ABNORMAL LOW (ref 4.22–5.81)
Retic Count, Absolute: 64 K/uL (ref 19.0–186.0)
Retic Ct Pct: 1.7 % (ref 0.4–3.1)

## 2023-12-09 LAB — FERRITIN: Ferritin: 60 ng/mL (ref 24–336)

## 2023-12-09 LAB — FOLATE: Folate: 5.3 ng/mL — ABNORMAL LOW (ref >5.9)

## 2023-12-09 LAB — HEMOGLOBIN: Hemoglobin: 10.2 g/dL — ABNORMAL LOW (ref 13.0–17.0)

## 2023-12-09 LAB — VITAMIN B12: Vitamin B-12: 131 pg/mL — ABNORMAL LOW (ref 180–914)

## 2023-12-09 MED ORDER — FE FUM-VIT C-VIT B12-FA 460-60-0.01-1 MG PO CAPS
1.0000 | ORAL_CAPSULE | Freq: Two times a day (BID) | ORAL | Status: DC
  Administered 2023-12-09 – 2023-12-10 (×3): 1 via ORAL
  Filled 2023-12-09 (×3): qty 1

## 2023-12-09 NOTE — Progress Notes (Signed)
   Neurosurgery Progress Note  History: MARGARITO DEHAAS is s/p T3 transpedicular decompression T1-4 PSF  POD2: Pt experienced an episode of bradycardia yesterday afternoon and a rapid was called. His heart rate and blood pressure recovered independently. He is otherwise doing well this morning  POD1: pt continues to have some spasms in his legs overnight but feels his strength is somewhat better and the numbness in his legs has improved  Physical Exam: Vitals:   12/08/23 2025 12/09/23 0238  BP: 131/74 99/64  Pulse: (!) 104 95  Resp: 20 18  Temp: 99.1 F (37.3 C)   SpO2: 95% 96%    AA Ox3 CNI  Side Iliopsoas Quads Hamstring PF DF EHL   R 4- 4- 5 4 4+  4+  L 4 4+ 5 4+ 4+  4-   HV 135 over the last 24 hrs Data:  Other tests/results:     Latest Ref Rng & Units 12/09/2023    2:45 AM 12/08/2023    3:12 AM 12/07/2023    8:48 AM  CBC  WBC 4.0 - 10.5 K/uL  9.7  5.0   Hemoglobin 13.0 - 17.0 g/dL 89.7  87.9  87.0   Hematocrit 39.0 - 52.0 %  37.8  40.2   Platelets 150 - 400 K/uL  240  254       Latest Ref Rng & Units 12/08/2023    3:12 AM 12/07/2023    8:48 AM 12/07/2023    5:07 AM  BMP  Glucose 70 - 99 mg/dL 839  884  875   BUN 6 - 20 mg/dL 15  14  15    Creatinine 0.61 - 1.24 mg/dL 8.80  8.76  8.70   Sodium 135 - 145 mmol/L 139  142  142   Potassium 3.5 - 5.1 mmol/L 4.6  4.0  3.5   Chloride 98 - 111 mmol/L 105  106  104   CO2 22 - 32 mmol/L 26  28  27    Calcium  8.9 - 10.3 mg/dL 8.5  8.8  8.8      Assessment/Plan:  RUTHERFORD ALARIE is a 58 y.o presenting with progressive thoracic myelopathy s/p T3 transpedicular decompression T1-4 PSF   - mobilize - pain control - DVT prophylaxis - will continue HV for now. Re-evaluate for removal tomorrow.  - will continue wound vac.  - PTOT - remainder of care per primary team  Edsel Goods PA-C Department of Neurosurgery

## 2023-12-09 NOTE — Progress Notes (Signed)
  PROGRESS NOTE    Corey [name]  FMW:[id number] DOB: 1965/06/30 DOA: 12/06/2023 PCP: Joyce Norleen BROCKS, MD  133A/133A-AA  LOS: 3 days   Brief hospital course:   Assessment & Plan: [name] is a 58 year old male with history of thoracic myelopathy who presented to emergency department for progressive weakness.   He was evaluated by neurosurgery outpatient and was sent to the emergency department for expedited admission and treatment of severe progressive thoracic myelopathy.  7/10: Neurosurgery will decide about drain tomorrow.  Hemoglobin slowly decreasing now at 10.2.  Anemia panel consistent with anemia of chronic disease with iron and folic acid deficiency, B12 pending.  Starting on supplement   * Thoracic myelopathy Spinal stenosis s/p T3 transpedicular decompression T1-4 PSF on 7/8 Progressive thoracic myelopathy with high risk spinal cord injury with reperfusion.  Pt is s/p surgery and tolerated it well. --cont HV and wound vac. --PT/OT-recommending CIR  Depression --cont home paroxetine   Morbid obesity (HCC), BMI 36 This complicates overall care and prognosis.   OSA on CPAP CPAP nightly  Brief AMS --early afternoon on 7/9, rapid response called when pt became less responsive, HR dropped to 40's, and incontinent of urine, when pt leaned forward during drain repositioning.  Pt reported feeling faint and nauseated during the episode.  By the time of rapid response arrival, pt was feeling back to baseline, with stable vitals. --likely vasovagal    DVT prophylaxis: SCD/Compression stockings Code Status: Full code  Family Communication: wife updated at bedside today Level of care: Med-Surg Dispo:   The patient is from: home Anticipated d/c is to: CIR Anticipated d/c date is: 1-2 days, when neurosurgery clears.   Subjective and Interval History:  Patient was seen and examined today.  He was having back pain with ambulation.  No new deficit.  He was feeling  little dizzy while working with PT.   Objective: Vitals:   12/08/23 1524 12/08/23 2025 12/09/23 0238 12/09/23 0810  BP: 121/68 131/74 99/64 114/69  Pulse:  (!) 104 95 88  Resp:  20 18 16   Temp: 98.6 F (37 C) 99.1 F (37.3 C)  99.2 F (37.3 C)  TempSrc: Oral Oral  Oral  SpO2: 100% 95% 96% 98%  Weight:      Height:        Intake/Output Summary (Last 24 hours) at 12/09/2023 1534 Last data filed at 12/09/2023 1453 Gross per 24 hour  Intake 100 ml  Output 1280 ml  Net -1180 ml   Filed Weights   12/06/23 1654 12/07/23 1431 12/07/23 2048  Weight: 111.1 kg 111.1 kg 108.4 kg    Examination:   General.  Obese gentleman, in no acute distress. Pulmonary.  Lungs clear bilaterally, normal respiratory effort. CV.  Regular rate and rhythm, no JVD, rub or murmur. Abdomen.  Soft, nontender, nondistended, BS positive. CNS.  Alert and oriented .  No focal neurologic deficit. Extremities.  No edema,  pulses intact and symmetrical. Psychiatry.  Judgment and insight appears normal.   Surgical drain connected to wound vac    Data Reviewed: I have personally reviewed labs and imaging studies  Time spent: 50 minutes  This record has been created using Conservation officer, historic buildings. Errors have been sought and corrected,but may not always be located. Such creation errors do not reflect on the standard of care.   Amaryllis Dare, MD [facility] If 7PM-7AM, please contact night-coverage 12/09/2023, 3:34 PM

## 2023-12-09 NOTE — Progress Notes (Signed)
 Physical Therapy Treatment Patient Details Name: Corey [name] MRN: [id number] DOB: August 23, 1965 Today's Date: 12/09/2023   History of Present Illness Pt is a 58 yo male  s/p emergent T3 transpedicular decompression T1-4 PSF. PMH of back surgery, TKA, hernia repair, HTN, sleep apnea.    PT Comments  Pt remained very motivated for all mobility today, but limited by lightheadedness. Reported 4/10 thoracic/neck pain, increased from 3/10 from supine. Log rolling with minA and bedrails, extra time. BP in sitting 128/76. Pt immediately complained of lightheadedness that did worsen over time, became diaphoretic. Symptoms did not truly resolve for patient until returned to supine, modAx2. BP in sitting 98/52 after being seated for 2 minutes after standing. Pt agreeable to transfer to recliner but due to ongoing symptoms and pt presentation, encouraged pt to return to supine. The patient would benefit from further skilled PT intervention to continue to progress towards goals.    If plan is discharge home, recommend the following: A little help with walking and/or transfers;A little help with bathing/dressing/bathroom;Assistance with cooking/housework;Assist for transportation;Help with stairs or ramp for entrance   Can travel by private vehicle        Equipment Recommendations  None recommended by PT    Recommendations for Other Services       Precautions / Restrictions Precautions Precautions: Fall;Back Precaution Booklet Issued: Yes (comment) Recall of Precautions/Restrictions: Intact Precaution/Restrictions Comments: spinal precautions, no brace needed per orders Restrictions Weight Bearing Restrictions Per Provider Order: No     Mobility  Bed Mobility Overal bed mobility: Needs Assistance Bed Mobility: Rolling, Sidelying to Sit Rolling: Min assist, Used rails Sidelying to sit: Mod assist, Used rails            Transfers Overall transfer level: Needs assistance Equipment used:  Rolling walker (2 wheels) Transfers: Sit to/from Stand Sit to Stand: Min assist, +2 physical assistance           General transfer comment: cues for technique, steadying assist    Ambulation/Gait               General Gait Details: unable to ambulate due to light headedness   Stairs             Wheelchair Mobility     Tilt Bed    Modified Rankin (Stroke Patients Only)       Balance Overall balance assessment: Needs assistance Sitting-balance support: Feet supported Sitting balance-Leahy Scale: Fair     Standing balance support: Bilateral upper extremity supported, Reliant on assistive device for balance Standing balance-Leahy Scale: Poor                              Communication    Cognition Arousal: Alert Behavior During Therapy: WFL for tasks assessed/performed   PT - Cognitive impairments: No apparent impairments                         Following commands: Intact      Cueing    Exercises      General Comments        Pertinent Vitals/Pain Pain Assessment Pain Assessment: 0-10 Pain Score: 4  Pain Location: thoracic area Pain Descriptors / Indicators: Sore, Spasm Pain Intervention(s): Limited activity within patient's tolerance, Monitored during session, Repositioned    Home Living  Prior Function            PT Goals (current goals can now be found in the care plan section) Progress towards PT goals: Progressing toward goals    Frequency    7X/week      PT Plan      Co-evaluation              AM-PAC PT 6 Clicks Mobility   Outcome Measure  Help needed turning from your back to your side while in a flat bed without using bedrails?: A Lot Help needed moving from lying on your back to sitting on the side of a flat bed without using bedrails?: A Lot Help needed moving to and from a bed to a chair (including a wheelchair)?: A Little Help needed standing  up from a chair using your arms (e.g., wheelchair or bedside chair)?: A Little Help needed to walk in hospital room?: A Little Help needed climbing 3-5 steps with a railing? : A Lot 6 Click Score: 15    End of Session   Activity Tolerance: Other (comment) (limited by light headedness) Patient left: in chair;with call bell/phone within reach;with chair alarm set Nurse Communication: Mobility status PT Visit Diagnosis: Other abnormalities of gait and mobility (R26.89);Difficulty in walking, not elsewhere classified (R26.2);Muscle weakness (generalized) (M62.81);Pain Pain - Right/Left:  (midline) Pain - part of body:  (thoracic)     Time: 8941-8873 PT Time Calculation (min) (ACUTE ONLY): 28 min  Charges:    $Therapeutic Activity: 23-37 mins PT General Charges $$ ACUTE PT VISIT: 1 Visit                    Doyal Shams PT, DPT 1:33 PM,12/09/23

## 2023-12-09 NOTE — Progress Notes (Signed)
Inpatient Rehab Admissions Coordinator:    I continue to await insurance auth for CIR. I will follow for potential admit pending insurance auth.   Oliviya Gilkison, MS, CCC-SLP Rehab Admissions Coordinator  [phone] (celll) [phone] (office)  

## 2023-12-10 ENCOUNTER — Inpatient Hospital Stay (HOSPITAL_BASED_OUTPATIENT_CLINIC_OR_DEPARTMENT_OTHER)

## 2023-12-10 ENCOUNTER — Inpatient Hospital Stay (HOSPITAL_COMMUNITY)
Admission: AD | Admit: 2023-12-10 | Discharge: 2023-12-20 | DRG: 560 | Disposition: A | Discharge status: Home / Self Care | Source: Intra-hospital | Attending: Physical Medicine and Rehabilitation | Admitting: Physical Medicine and Rehabilitation

## 2023-12-10 DIAGNOSIS — R001 Bradycardia, unspecified: Secondary | ICD-10-CM | POA: Diagnosis not present

## 2023-12-10 DIAGNOSIS — Z4789 Encounter for other orthopedic aftercare: Principal | ICD-10-CM

## 2023-12-10 DIAGNOSIS — F32A Depression, unspecified: Secondary | ICD-10-CM | POA: Diagnosis present

## 2023-12-10 DIAGNOSIS — D529 Folate deficiency anemia, unspecified: Secondary | ICD-10-CM | POA: Diagnosis not present

## 2023-12-10 DIAGNOSIS — D62 Acute posthemorrhagic anemia: Secondary | ICD-10-CM | POA: Diagnosis present

## 2023-12-10 DIAGNOSIS — R32 Unspecified urinary incontinence: Secondary | ICD-10-CM | POA: Diagnosis not present

## 2023-12-10 DIAGNOSIS — E785 Hyperlipidemia, unspecified: Secondary | ICD-10-CM | POA: Diagnosis not present

## 2023-12-10 DIAGNOSIS — Z800 Family history of malignant neoplasm of digestive organs: Secondary | ICD-10-CM | POA: Diagnosis not present

## 2023-12-10 DIAGNOSIS — Z880 Allergy status to penicillin: Secondary | ICD-10-CM | POA: Diagnosis not present

## 2023-12-10 DIAGNOSIS — Z6835 Body mass index (BMI) 35.0-35.9, adult: Secondary | ICD-10-CM | POA: Diagnosis not present

## 2023-12-10 DIAGNOSIS — R258 Other abnormal involuntary movements: Secondary | ICD-10-CM | POA: Diagnosis present

## 2023-12-10 DIAGNOSIS — D638 Anemia in other chronic diseases classified elsewhere: Secondary | ICD-10-CM | POA: Diagnosis not present

## 2023-12-10 DIAGNOSIS — Z6836 Body mass index (BMI) 36.0-36.9, adult: Secondary | ICD-10-CM | POA: Diagnosis not present

## 2023-12-10 DIAGNOSIS — F54 Psychological and behavioral factors associated with disorders or diseases classified elsewhere: Secondary | ICD-10-CM | POA: Diagnosis not present

## 2023-12-10 DIAGNOSIS — I82453 Acute embolism and thrombosis of peroneal vein, bilateral: Secondary | ICD-10-CM | POA: Diagnosis not present

## 2023-12-10 DIAGNOSIS — N529 Male erectile dysfunction, unspecified: Secondary | ICD-10-CM | POA: Diagnosis present

## 2023-12-10 DIAGNOSIS — R252 Cramp and spasm: Secondary | ICD-10-CM | POA: Diagnosis not present

## 2023-12-10 DIAGNOSIS — T148XXA Other injury of unspecified body region, initial encounter: Secondary | ICD-10-CM | POA: Diagnosis not present

## 2023-12-10 DIAGNOSIS — I82461 Acute embolism and thrombosis of right calf muscular vein: Secondary | ICD-10-CM | POA: Diagnosis present

## 2023-12-10 DIAGNOSIS — F524 Premature ejaculation: Secondary | ICD-10-CM

## 2023-12-10 DIAGNOSIS — R531 Weakness: Secondary | ICD-10-CM | POA: Diagnosis present

## 2023-12-10 DIAGNOSIS — M4714 Other spondylosis with myelopathy, thoracic region: Secondary | ICD-10-CM | POA: Diagnosis not present

## 2023-12-10 DIAGNOSIS — M4804 Spinal stenosis, thoracic region: Secondary | ICD-10-CM | POA: Diagnosis present

## 2023-12-10 DIAGNOSIS — K5900 Constipation, unspecified: Secondary | ICD-10-CM | POA: Diagnosis present

## 2023-12-10 DIAGNOSIS — G4733 Obstructive sleep apnea (adult) (pediatric): Secondary | ICD-10-CM | POA: Diagnosis not present

## 2023-12-10 DIAGNOSIS — Z91199 Patient's noncompliance with other medical treatment and regimen due to unspecified reason: Secondary | ICD-10-CM | POA: Diagnosis not present

## 2023-12-10 DIAGNOSIS — Z96652 Presence of left artificial knee joint: Secondary | ICD-10-CM | POA: Diagnosis not present

## 2023-12-10 DIAGNOSIS — G9589 Other specified diseases of spinal cord: Secondary | ICD-10-CM | POA: Diagnosis present

## 2023-12-10 DIAGNOSIS — Z981 Arthrodesis status: Secondary | ICD-10-CM

## 2023-12-10 DIAGNOSIS — Z820 Family history of epilepsy and other diseases of the nervous system: Secondary | ICD-10-CM

## 2023-12-10 DIAGNOSIS — G992 Myelopathy in diseases classified elsewhere: Secondary | ICD-10-CM | POA: Diagnosis not present

## 2023-12-10 DIAGNOSIS — I10 Essential (primary) hypertension: Secondary | ICD-10-CM | POA: Diagnosis not present

## 2023-12-10 DIAGNOSIS — M7989 Other specified soft tissue disorders: Secondary | ICD-10-CM | POA: Diagnosis not present

## 2023-12-10 DIAGNOSIS — Z7901 Long term (current) use of anticoagulants: Secondary | ICD-10-CM

## 2023-12-10 DIAGNOSIS — R296 Repeated falls: Secondary | ICD-10-CM | POA: Diagnosis present

## 2023-12-10 DIAGNOSIS — Z79899 Other long term (current) drug therapy: Secondary | ICD-10-CM

## 2023-12-10 DIAGNOSIS — I82443 Acute embolism and thrombosis of tibial vein, bilateral: Secondary | ICD-10-CM | POA: Diagnosis not present

## 2023-12-10 DIAGNOSIS — I959 Hypotension, unspecified: Secondary | ICD-10-CM | POA: Diagnosis not present

## 2023-12-10 DIAGNOSIS — I803 Phlebitis and thrombophlebitis of lower extremities, unspecified: Secondary | ICD-10-CM | POA: Diagnosis not present

## 2023-12-10 MED ORDER — ENOXAPARIN SODIUM 40 MG/0.4ML IJ SOSY: 40.0000 mg | PREFILLED_SYRINGE | INTRAMUSCULAR | Status: DC

## 2023-12-10 MED ORDER — METHOCARBAMOL 1000 MG/10ML IJ SOLN: 500.0000 mg | Freq: Four times a day (QID) | INTRAMUSCULAR | Status: DC | PRN

## 2023-12-10 MED ORDER — OXYCODONE HCL 5 MG PO TABS
10.0000 mg | ORAL_TABLET | ORAL | Status: DC | PRN
  Administered 2023-12-10 – 2023-12-20 (×19): 10 mg via ORAL
  Filled 2023-12-10 (×20): qty 2

## 2023-12-10 MED ORDER — ACETAMINOPHEN 500 MG PO TABS: 1000.0000 mg | ORAL_TABLET | Freq: Four times a day (QID) | ORAL | 0 refills | Status: DC

## 2023-12-10 MED ORDER — DOCUSATE SODIUM 100 MG PO CAPS
100.0000 mg | ORAL_CAPSULE | Freq: Two times a day (BID) | ORAL | Status: DC
Start: 2023-12-10 — End: 2023-12-22

## 2023-12-10 MED ORDER — CYANOCOBALAMIN 1000 MCG/ML IJ SOLN
1000.0000 ug | Freq: Every day | INTRAMUSCULAR | Status: AC
  Administered 2023-12-11 – 2023-12-12 (×2): 1000 ug via INTRAMUSCULAR
  Filled 2023-12-10 (×2): qty 1

## 2023-12-10 MED ORDER — FE FUM-VIT C-VIT B12-FA 460-60-0.01-1 MG PO CAPS: 1.0000 | ORAL_CAPSULE | Freq: Two times a day (BID) | ORAL | Status: DC

## 2023-12-10 MED ORDER — MUPIROCIN 2 % EX OINT
1.0000 | TOPICAL_OINTMENT | Freq: Two times a day (BID) | CUTANEOUS | Status: AC
  Administered 2023-12-10 – 2023-12-11 (×3): 1 via NASAL
  Filled 2023-12-10: qty 22

## 2023-12-10 MED ORDER — ONDANSETRON HCL 4 MG PO TABS: 4.0000 mg | ORAL_TABLET | Freq: Four times a day (QID) | ORAL | Status: DC | PRN

## 2023-12-10 MED ORDER — SENNA 8.6 MG PO TABS
1.0000 | ORAL_TABLET | Freq: Two times a day (BID) | ORAL | Status: DC
  Administered 2023-12-10 – 2023-12-18 (×14): 8.6 mg via ORAL
  Filled 2023-12-10 (×20): qty 1

## 2023-12-10 MED ORDER — FE FUM-VIT C-VIT B12-FA 460-60-0.01-1 MG PO CAPS
1.0000 | ORAL_CAPSULE | Freq: Two times a day (BID) | ORAL | Status: DC
  Administered 2023-12-10 – 2023-12-20 (×20): 1 via ORAL
  Filled 2023-12-10 (×20): qty 1

## 2023-12-10 MED ORDER — CYANOCOBALAMIN 1000 MCG/ML IJ SOLN: 1000.0000 ug | Freq: Every day | INTRAMUSCULAR | Status: DC

## 2023-12-10 MED ORDER — CYANOCOBALAMIN 1000 MCG/ML IJ SOLN
1000.0000 ug | Freq: Every day | INTRAMUSCULAR | Status: DC
  Administered 2023-12-10: 1000 ug via INTRAMUSCULAR
  Filled 2023-12-10: qty 1

## 2023-12-10 MED ORDER — METHOCARBAMOL 500 MG PO TABS
500.0000 mg | ORAL_TABLET | Freq: Four times a day (QID) | ORAL | Status: DC | PRN
  Administered 2023-12-11 – 2023-12-15 (×10): 500 mg via ORAL
  Filled 2023-12-10 (×11): qty 1

## 2023-12-10 MED ORDER — OXYCODONE HCL 5 MG PO TABS: 5.0000 mg | ORAL_TABLET | ORAL | Status: DC | PRN

## 2023-12-10 MED ORDER — ACETAMINOPHEN 325 MG PO TABS
325.0000 mg | ORAL_TABLET | ORAL | Status: DC | PRN
  Filled 2023-12-10: qty 2

## 2023-12-10 MED ORDER — PAROXETINE HCL 20 MG PO TABS
20.0000 mg | ORAL_TABLET | Freq: Every day | ORAL | Status: DC
  Administered 2023-12-11 – 2023-12-20 (×10): 20 mg via ORAL
  Filled 2023-12-10 (×10): qty 1

## 2023-12-10 MED ORDER — OXYCODONE HCL 5 MG PO TABS: 5.0000 mg | ORAL_TABLET | Freq: Four times a day (QID) | ORAL | 0 refills | Status: DC | PRN

## 2023-12-10 MED ORDER — ONDANSETRON HCL 4 MG/2ML IJ SOLN: 4.0000 mg | Freq: Four times a day (QID) | INTRAMUSCULAR | Status: DC | PRN

## 2023-12-10 MED ORDER — SENNA 8.6 MG PO TABS: 1.0000 | ORAL_TABLET | Freq: Two times a day (BID) | ORAL | Status: DC

## 2023-12-10 MED ORDER — ENOXAPARIN SODIUM 40 MG/0.4ML IJ SOSY
40.0000 mg | PREFILLED_SYRINGE | INTRAMUSCULAR | Status: DC
  Administered 2023-12-11 – 2023-12-15 (×5): 40 mg via SUBCUTANEOUS
  Filled 2023-12-10 (×5): qty 0.4

## 2023-12-10 MED ORDER — POLYETHYLENE GLYCOL 3350 17 G PO PACK: 17.0000 g | PACK | Freq: Every day | ORAL | Status: DC | PRN

## 2023-12-10 MED ORDER — ACETAMINOPHEN 650 MG RE SUPP: 650.0000 mg | RECTAL | Status: DC | PRN

## 2023-12-10 MED ORDER — DOCUSATE SODIUM 100 MG PO CAPS
100.0000 mg | ORAL_CAPSULE | Freq: Two times a day (BID) | ORAL | Status: DC
  Administered 2023-12-10 – 2023-12-18 (×14): 100 mg via ORAL
  Filled 2023-12-10 (×20): qty 1

## 2023-12-10 MED ORDER — POLYETHYLENE GLYCOL 3350 17 G PO PACK
17.0000 g | PACK | Freq: Every day | ORAL | Status: DC | PRN
Start: 2023-12-10 — End: 2023-12-22

## 2023-12-10 MED ORDER — ACETAMINOPHEN 325 MG PO TABS: 650.0000 mg | ORAL_TABLET | ORAL | Status: DC | PRN

## 2023-12-10 NOTE — H&P (Signed)
 Physical Medicine and Rehabilitation Admission H&P        Chief Complaint  Patient presents with   Back Pain  : HPI: Corey [name] is a 58 year old right-handed male with history of hypertension on no antihypertensive medication, hyperlipidemia currently on no statin drugs, depression, sleep apnea, left total knee arthroplasty by Dr. Oneil Herald 11/11/2020 as well as past surgical microdiscectomy L5-S1.  Per chart review patient lives with spouse.  1 level home 2 steps to enter.  Independent ADLs and mobility but requiring more physical assist over the last several weeks with noted falls due to increasing weakness.  Presented 12/06/2023 progressive sensation loss in bilateral lower extremities that started in his feet progressing up to his mid abdomen.  MRI imaging revealed multilevel thoracic spondylosis with resulted diffuse spinal stenosis at T1-2 through T3-4, severe at the T2-3 level.  Secondary cord flattening/compression at T2-3 with associated cord signal changes consistent with edema/myelomalacia.  Multifactorial degenerative changes with resultant multilevel foraminal narrowing.  Notable findings include moderate bilateral foraminal narrowing at T2-3 and T3-4 severe right foraminal narrowing at T11-12.  Underwent T3 transpedicular decompression, T1-T4 PSF on 12/07/2023 per neurosurgery Dr. Penne Sharps.  Postoperative drain and wound VAC placed.  Plan is for drain to be removed 12/11/2022 and wound VAC can be removed when loses suction or when the battery runs down.NO BRACE REQUIRED.  Placed on Lovenox  or DVT prophylaxis.  Therapy evaluations completed and due to patient's decreased functional mobility was admitted for a comprehensive rehab program.   Review of Systems  Constitutional:  Negative for chills and fever.  HENT:  Negative for hearing loss.   Eyes:  Negative for blurred vision and double vision.  Respiratory:  Negative for cough, shortness of breath and wheezing.    Cardiovascular:  Negative for chest pain, palpitations and leg swelling.  Gastrointestinal:  Positive for constipation. Negative for heartburn, nausea and vomiting.  Genitourinary:  Positive for urgency. Negative for dysuria, flank pain and hematuria.  Musculoskeletal:  Positive for back pain, falls and myalgias.  Skin:  Negative for rash.  Neurological:  Positive for sensory change and weakness.  Psychiatric/Behavioral:  Positive for depression.   All other systems reviewed and are negative.      Past Medical History:  Diagnosis Date   Arthritis      knee, left   ED (erectile dysfunction)     Hyperlipidemia     Hypertension     Sleep apnea      CPAP - not using lately due to recall              Past Surgical History:  Procedure Laterality Date   ANKLE BONE SURGERY       APPLICATION OF INTRAOPERATIVE CT SCAN   12/07/2023    Procedure: APPLICATION OF INTRAOPERATIVE CT SCAN;  Surgeon: Sharps Penne ORN, MD;  Location: [facility] ORS;  Service: Neurosurgery;;   BACK SURGERY       COLLAR BONE SURGEY       COLONOSCOPY       FRACTURE SURGERY       HERNIA REPAIR       KNEE ARTHROSCOPY Left 2006   LAPAROSCOPIC APPENDECTOMY N/A 01/13/2021    Procedure: APPENDECTOMY LAPAROSCOPIC WITH LYSIS OF ADHESIONS;  Surgeon: Sheldon Standing, MD;  Location: [facility];  Service: General;  Laterality: N/A;   LUMBAR MICRODISCECTOMY       POLYPECTOMY       SKIN BIOPSY Left 08/11/2021  nodulocystic fat nercrosis   TOTAL KNEE ARTHROPLASTY Left 11/11/2020    Procedure: LEFT TOTAL KNEE ARTHROPLASTY;  Surgeon: Barbarann Oneil BROCKS, MD;  Location: [facility] OR;  Service: Orthopedics;  Laterality: Left;   UMBILICAL HERNIA REPAIR                 Family History  Problem Relation Age of Onset   Alzheimer's disease Mother     Stevens-Johnson syndrome Father     Autism Brother     Colon cancer Other     Colon polyps Neg Hx     Esophageal cancer Neg Hx     Rectal cancer Neg Hx     Stomach cancer Neg Hx          Social  History:  reports that he has never smoked. He has never used smokeless tobacco. He reports current alcohol use of about 3.0 standard drinks of alcohol per week. He reports that he does not currently use drugs after having used the following drugs: Marijuana. Allergies:  Allergies       Allergies  Allergen Reactions   Penicillin G        Other Reaction(s): syncope   Penicillins Other (See Comments)      Makes pt black out            Medications Prior to Admission  Medication Sig Dispense Refill   acetaminophen  (TYLENOL ) 500 MG tablet Take 2 tablets (1,000 mg total) by mouth every 6 (six) hours. 30 tablet 0   [START ON 12/11/2023] cyanocobalamin  (VITAMIN B12) 1000 MCG/ML injection Inject 1 mL (1,000 mcg total) into the muscle daily for 6 days.       docusate sodium  (COLACE) 100 MG capsule Take 1 capsule (100 mg total) by mouth 2 (two) times daily.       Fe Fum-Vit C-Vit B12-FA (TRIGELS-F FORTE) CAPS capsule Take 1 capsule by mouth 2 (two) times daily.       methocarbamol  (ROBAXIN ) 500 MG tablet Take 1 tablet (500 mg total) by mouth every 6 (six) hours as needed for muscle spasms. 30 tablet 0   Multiple Vitamins-Minerals (HAIR SKIN AND NAILS FORMULA PO) Take by mouth.       Multiple Vitamins-Minerals (MULTIVITAMIN WITH MINERALS) tablet Take 2 tablets by mouth daily.       oxyCODONE  (OXY IR/ROXICODONE ) 5 MG immediate release tablet Take 1 tablet (5 mg total) by mouth every 6 (six) hours as needed for moderate pain (pain score 4-6) or severe pain (pain score 7-10). 20 tablet 0   PARoxetine  (PAXIL ) 20 MG tablet TAKE 1 TABLET BY MOUTH EVERY DAY IN THE MORNING (Patient taking differently: Take 20 mg by mouth in the morning.) 90 tablet 1   polyethylene glycol (MIRALAX  / GLYCOLAX ) 17 g packet Take 17 g by mouth daily as needed for mild constipation.       senna (SENOKOT) 8.6 MG TABS tablet Take 1 tablet (8.6 mg total) by mouth 2 (two) times daily.                  Home: Home  Living Family/patient expects to be discharged to:: Private residence Living Arrangements: Spouse/significant other Available Help at Discharge: Family, Available 24 hours/day Type of Home: House Home Access: Stairs to enter [employer] of Steps: 2 Entrance Stairs-Rails: None Home Layout: One level Bathroom Shower/Tub: Health visitor: Standard Bathroom Accessibility: Yes Home Equipment: Agricultural consultant (2 wheels), [employer] - single point, Rollator (4 wheels), BSC/3in1, Shower seat Additional  Comments: mutiple daily falls due to progressive leg weakness,  10+ since beginning of June   Functional History: Prior Function Prior Level of Function : Working/employed, Driving, Independent/Modified Independent Mobility Comments: prior to onset of weakness, independent, working full time. in the last week started using rollator ADLs Comments: prior to beginning of June, pt completely independent. pt now requires assist for most BADLs (showers, LB dressing) due to BLE weakness   Functional Status:  Mobility: Bed Mobility Overal bed mobility: Needs Assistance Bed Mobility: Rolling, Sidelying to Sit Rolling: Min assist, Used rails Sidelying to sit: Mod assist, Used rails General bed mobility comments: NT, pt recieved and left in recliner Transfers Overall transfer level: Needs assistance Equipment used: Rolling walker (2 wheels) Transfers: Sit to/from Stand Sit to Stand: Min assist, +2 physical assistance General transfer comment: cues for technique, steadying assist Ambulation/Gait Ambulation/Gait assistance: Contact guard assist Gait Distance (Feet):  (2 bouts of ~62ft) Assistive device: Rolling walker (2 wheels) General Gait Details: unable to ambulate due to light headedness Gait velocity: decreased   ADL: ADL Overall ADL's : Needs assistance/impaired Lower Body Dressing: Sit to/from stand, Moderate assistance, Sitting/lateral leans, Cueing for sequencing,  Cueing for safety, With adaptive equipment Lower Body Dressing Details (indicate cue type and reason): educated pt on use of AE for LB dressing with pt and spouse verbalizing understanding Toilet Transfer: Minimal assistance, Rolling walker (2 wheels), BSC/3in1 Toileting- Clothing Manipulation and Hygiene: Maximal assistance, Sit to/from stand, Cueing for sequencing, Cueing for safety, Adhering to back precautions Functional mobility during ADLs: Minimal assistance, Rolling walker (2 wheels), Cueing for sequencing, Cueing for safety General ADL Comments: pt significantly far from baseline, anticipate up to MAX A for LB ADLs   Cognition: Cognition Orientation Level: Oriented X4 Cognition Arousal: Alert Behavior During Therapy: WFL for tasks assessed/performed   Physical Exam: Blood pressure 114/66, pulse 82, temperature 98.6 F (37 C), resp. rate 18, height 5' 9 (1.753 m), weight 108.4 kg, SpO2 98%.  Constitutional: No apparent distress. Appropriate appearance for age. +Obese HENT: No JVD. Neck Supple. Trachea midline. Atraumatic, normocephalic. Eyes: PERRLA. EOMI. Visual fields grossly intact.  Cardiovascular: RRR, no murmurs/rub/gallops. No Edema. Peripheral pulses 2+  Respiratory: CTAB. No rales, rhonchi, or wheezing. On RA.  Abdomen: + bowel sounds, normoactive. Mild distention, no tenderness.  Skin: C/D/I. No apparent lesions.   - Accordion drain with minimal gross bloody output   - Wound vac, well-sealed, no active drainage  MSK:      No apparent deformity. + L knee TKR scar. R knee extension slightly limited 0-5 degrees.       Neurologic exam:  Cognition: AAO to person, place, time and event.  Language: Fluent, No substitutions or neoglisms. No dysarthria. Names 3/3 objects correctly.  Memory: Recalls 3/3 objects at 5 minutes. No apparent deficits  Insight: Good  insight into current condition.  Mood: Pleasant affect, appropriate mood.  Sensation: To light touch intact in  BL UEs and Les; notes sensory decrease in all bilateral toes Reflexes: 2+ in BL UE and LEs. Negative Hoffman's and babinski signs bilaterally.  CN: 2-12 grossly intact.  Coordination: No apparent tremors. + LLE ataxia with HTS; otherwise intact Spasticity: MAS 0 in all extremities.       Strength:                RUE: 5/5 SA, 5/5 EF, 5/5 EE, 5/5 WE, 5/5 FF, 5/5 FA  LUE:  5/5 SA, 5/5 EF, 5/5 EE, 5/5 WE, 5/5 FF, 5/5 FA                RLE: 4/5 HF, 5/5 KE, 5/5  DF, 5/5  EHL, 5/5  PF                 LLE:  4-/5 HF, 4-/5 KE, 5/5  DF, 5/5  EHL, 5/5  PF     Lab Results Last 48 Hours        Results for orders placed or performed during the hospital encounter of 12/06/23 (from the past 48 hours)  Hemoglobin     Status: Abnormal    Collection Time: 12/09/23  2:45 AM  Result Value Ref Range    Hemoglobin 10.2 (L) 13.0 - 17.0 g/dL      Comment: Performed at [facility], [address] [place], [place] [address]  Vitamin B12     Status: Abnormal    Collection Time: 12/09/23 10:27 AM  Result Value Ref Range    Vitamin B-12 131 (L) 180 - 914 pg/mL      Comment: (NOTE) This assay is not validated for testing neonatal or myeloproliferative syndrome specimens for Vitamin B12 levels. Performed at [facility], [address] [place], [place] [address]    Folate     Status: Abnormal    Collection Time: 12/09/23 10:27 AM  Result Value Ref Range    Folate 5.3 (L) >5.9 ng/mL      Comment: Performed at [facility], [address] [place], [place] [address]  Iron and TIBC     Status: Abnormal    Collection Time: 12/09/23 10:27 AM  Result Value Ref Range    Iron 19 (L) 45 - 182 ug/dL    TIBC 762 (L) 749 - 549 ug/dL    Saturation Ratios 8 (L) 17.9 - 39.5 %    UIBC 218 ug/dL      Comment: Performed at [facility], [address] [place], [place] [address]  Ferritin     Status: None    Collection Time: 12/09/23 10:27 AM  Result Value Ref Range     Ferritin 60 24 - 336 ng/mL      Comment: Performed at [facility], [address] [place], [place] [address]  Reticulocytes     Status: Abnormal    Collection Time: 12/09/23 10:27 AM  Result Value Ref Range    Retic Ct Pct 1.7 0.4 - 3.1 %    RBC. 3.81 (L) 4.22 - 5.81 MIL/uL    Retic Count, Absolute 64.0 19.0 - 186.0 K/uL    Immature Retic Fract 16.4 (H) 2.3 - 15.9 %      Comment: Performed at [facility], [address], [place], [place] [address]      Imaging Results (Last 48 hours)  No results found.         Blood pressure 114/66, pulse 82, temperature 98.6 F (37 C), resp. rate 18, height 5' 9 (1.753 m), weight 108.4 kg, SpO2 98%.   Medical Problem List and Plan: 1. Functional deficits secondary to thoracic myelopathy.  Status post T3 transpedicular decompression, T1-T4 PSF 12/07/2023 per Dr. Penne Sharps neurosurgery.NO BRACE REQUIRED.  As per neurosurgery postoperative drain to be removed 12/11/2022.  Wound VAC can be removed when loses suction or when the battery runs down.             -patient may not  shower until vac removed             -ELOS/Goals: 10-12 days, Mod I PT/OT   - Stable for IRF admission  2.  Antithrombotics: -DVT/anticoagulation:  Pharmaceutical: Lovenox .  Check vascular study             -antiplatelet therapy: N/A   - 7/11: Admission duplex + DVT R proximal posterior tibial veins, R mid-peroneal veins, L posterior tibial veins, L peroneal veins, and intramuscular thrombosis on R gastrocnemius veins. Corey [name] d/w Neurosurgery Dr. Theressa office, unable to start full dose AC until 7d post-op. Recommend continuation of PPX dose lovenox  40 mg daily with repeat Duplex Monday to ensure no extension.   -- Later, messaged by Dr. Claudene requesting additional consult for IVC filter; order placed, d/w [facility] for follow-up in AM with IR for eval  3. Pain Management: Robaxin  and oxycodone  as needed 4. Mood/Behavior/Sleep: Paxil  20 mg  daily             -antipsychotic agents:  5. Neuropsych/cognition: This patient is capable of making decisions on his own behalf. 6. Skin/Wound Care: Routine skin checks   - Per NSGY, HV drain can be removed in AM 7/12; order placed   - Wound vac to be removed once suction is lost/batteries run out  7. Fluids/Electrolytes/Nutrition: Routine in and outs with follow-up chemistries 8.  Constipation.  Colace 100 mg twice daily, MiraLAX  daily as needed  - LBM Mon/Tues per patient 9.  OSA.  CPAP 10.  Hypertension.  Currently on no antihypertensive medications or prior to admission.  Monitor with increased mobility  11. Anemia. 14->12->10 hgb, likely post-surgical. Monitor labs. Iron-Vitamin C -B12-FA supplement.      Toribio PARAS Angiulli, PA-C 12/10/2023  I have examined the patient independently and edited the note for HPI, ROS, exam, assessment, and plan as appropriate. I am in agreement with the above recommendations.   Corey JAYSON Likes, DO 12/10/2023

## 2023-12-10 NOTE — TOC Transition Note (Signed)
 Transition of Care [facility]) - Discharge Note   Patient Details  Name: Corey [name] MRN: [id number] Date of Birth: 03-Dec-1965  Transition of Care [facility]) CM/SW Contact:  Racheal LITTIE Schimke, RN Phone Number: 12/10/2023, 10:47 AM   Clinical Narrative: To discharge to [facility] via [facility].    Final next level of care: Acute to Acute Transfer     Patient Goals and CMS Choice Patient states their goals for this hospitalization and ongoing recovery are:: CIR          Discharge Placement              Patient chooses bed at:  [facility]) Patient to be transferred to facility by: [facility]   Patient and family notified of of transfer: 12/10/23  Discharge Plan and Services Additional resources added to the After Visit Summary for                    DME Agency: [facility]       HH Arranged: NA HH Agency: NA        Social Drivers of Health (SDOH) Interventions SDOH Screenings   Food Insecurity: No Food Insecurity (12/07/2023)  Housing: Low Risk  (12/07/2023)  Transportation Needs: No Transportation Needs (12/07/2023)  Utilities: Not At Risk (12/07/2023)  Alcohol Screen: Low Risk  (08/08/2021)  Depression (PHQ2-9): Low Risk  (11/23/2023)  Financial Resource Strain: Low Risk  (08/08/2021)  Physical Activity: Sufficiently Active (08/08/2021)  Social Connections: Moderately Integrated (12/07/2023)  Stress: Stress Concern Present (08/08/2021)  Tobacco Use: Low Risk  (12/07/2023)     Readmission Risk Interventions     No data to display

## 2023-12-10 NOTE — Progress Notes (Signed)
 Inpatient Rehabilitation Admission Medication Review by a Pharmacist  A complete drug regimen review was completed for this patient to identify any potential clinically significant medication issues.  High Risk Drug Classes Is patient taking? Indication by Medication  Antipsychotic No   Anticoagulant Yes Lovenox  - DVT px  Antibiotic Yes Bactroban  - decolonization  Opioid Yes Oxycodone  - pain  Antiplatelet No   Hypoglycemics/insulin No   Vasoactive Medication No   Chemotherapy No   Other Yes Tylenol  - pain Robaxin  - spasms B12 - anemia  Paxil  - depression Trigels Forte - supplement     Type of Medication Issue Identified Description of Issue Recommendation(s)  Drug Interaction(s) (clinically significant)     Duplicate Therapy     Allergy      No Medication Administration End Date     Incorrect Dose     Additional Drug Therapy Needed     Significant med changes from prior encounter (inform family/care partners about these prior to discharge).    Other       Clinically significant medication issues were identified that warrant physician communication and completion of prescribed/recommended actions by midnight of the next day:  No  Name of provider notified for urgent issues identified:   Provider Method of Notification:     Pharmacist comments:   Time spent performing this drug regimen review (minutes):  20   Sergio Batch, PharmD, [place], AAHIVP, CPP Infectious Disease Pharmacist 12/10/2023 12:30 PM

## 2023-12-10 NOTE — Plan of Care (Signed)

## 2023-12-10 NOTE — Discharge Instructions (Addendum)
 Inpatient Rehab Discharge Instructions  Corey [name] Discharge date and time: No discharge date for patient encounter.   Activities/Precautions/ Functional Status: Activity: activity as tolerated Diet: regular diet Wound Care: Routine skin checks Functional status:  ___ No restrictions     ___ Walk up steps independently ___ 24/7 supervision/assistance   ___ Walk up steps with assistance ___ Intermittent supervision/assistance  ___ Bathe/dress independently ___ Walk with walker     _x__ Bathe/dress with assistance ___ Walk Independently    ___ Shower independently ___ Walk with assistance    ___ Shower with assistance ___ No alcohol     ___ Return to work/school ________  Special Instructions: No driving smoking or alcohol  Follow-up 12/22/2023 for staple removal with Dr. Penne Sharps   COMMUNITY REFERRALS UPON DISCHARGE:    Outpatient: PT      OT              Agency: [facility]     Phone: [phone]              Appointment Date/Time:*Please expect follow-up within 7-10 business days to schedule your appointment. If you have not received follow-up, be sure to contact the site directly.*      My questions have been answered and I understand these instructions. I will adhere to these goals and the provided educational materials after my discharge from the hospital.  Patient/Caregiver Signature _______________________________ Date __________  Clinician Signature _______________________________________ Date __________  Please bring this form and your medication list with you to all your follow-up doctor's appointments.   ___________________________________________________________________________________________________________________   Information on my medicine - ELIQUIS  (apixaban )  This medication education was reviewed with me or my healthcare representative as part of my discharge preparation.   Why was Eliquis  prescribed for  you? Eliquis  was prescribed to treat blood clots that may have been found in the veins of your legs (deep vein thrombosis) or in your lungs (pulmonary embolism) and to reduce the risk of them occurring again.  What do You need to know about Eliquis  ? The dose is ONE 5 mg tablet taken TWICE daily.  Eliquis  may be taken with or without food.   Try to take the dose about the same time in the morning and in the evening. If you have difficulty swallowing the tablet whole please discuss with your pharmacist how to take the medication safely.  Take Eliquis  exactly as prescribed and DO NOT stop taking Eliquis  without talking to the doctor who prescribed the medication.  Stopping may increase your risk of developing a new blood clot.  Refill your prescription before you run out.  After discharge, you should have regular check-up appointments with your healthcare provider that is prescribing your Eliquis .    What do you do if you miss a dose? If a dose of ELIQUIS  is not taken at the scheduled time, take it as soon as possible on the same day and twice-daily administration should be resumed. The dose should not be doubled to make up for a missed dose.  Important Safety Information A possible side effect of Eliquis  is bleeding. You should call your healthcare provider right away if you experience any of the following: Bleeding from an injury or your nose that does not stop. Unusual colored urine (red or dark brown) or unusual colored stools (red or black). Unusual bruising for unknown reasons. A serious fall or if you hit your head (even if there is no bleeding).  Some medicines may  interact with Eliquis  and might increase your risk of bleeding or clotting while on Eliquis . To help avoid this, consult your healthcare provider or pharmacist prior to using any new prescription or non-prescription medications, including herbals, vitamins, non-steroidal anti-inflammatory drugs (NSAIDs) and  supplements.  This website has more information on Eliquis  (apixaban ): http://[email or web address] .com/eliquis dena

## 2023-12-10 NOTE — Discharge Summary (Signed)
 Physician Discharge Summary   Patient: Corey [name] MRN: [id number] DOB: 01/20/1966  Admit date:     12/06/2023  Discharge date: 12/10/23  Discharge Physician: Amaryllis Dare   PCP: Joyce Norleen BROCKS, MD   Recommendations at discharge:  Please obtain CBC and BMP on follow-up Follow-up with neurosurgery Please follow recommendations from neurosurgery regarding drain and wound VAC  Discharge Diagnoses: Principal Problem:   Thoracic myelopathy Active Problems:   Hyperlipidemia LDL goal <100   OSA on CPAP   Myelopathy concurrent with and due to spinal stenosis of thoracic region [facility])   Spinal stenosis   Morbid obesity [facility])   Depression   Hospital Course: [name] is a 58 year old male with history of thoracic myelopathy who presents emergency department for progressive weakness.  He was evaluated by neurosurgery outpatient and was sent to the emergency department for expedited admission and treatment of severe progressive thoracic myelopathy.  Patient underwent T3 transpedicular decompression, T1-T4 PSF on 7/8 with neurosurgery.  Tolerated the procedure well. Patient still has postoperative drain and wound VAC in place and neurosurgery placed recommendations and follow-up regarding drain and wound VAC.  Or physical therapist recommended CIR where he is being discharged for further rehab.  Patient was found to have anemia which is consistent with anemia of chronic disease with low TIBC and nutritional deficiencies involving iron, folic acid and B12.  Patient was started on supplement.  He will need an outpatient gastroenterology follow-up for further evaluation regarding the significant nutritional deficiencies.  Patient will continue on current medications and follow-up with her providers for further assistance.  Assessment and Plan: * Thoracic myelopathy Spinal stenosis s/p T3 transpedicular decompression T1-4 PSF on 7/8 Progressive thoracic myelopathy with high risk  spinal cord injury with reperfusion.  Pt is s/p surgery and tolerated it well. --cont HV and wound vac. --PT/OT-recommending CIR  Depression Home paroxetine  20 mg daily   Morbid obesity (HCC) This complicates overall care and prognosis.   OSA on CPAP CPAP nightly ordered   Pain control - [place]  Controlled Substance Reporting System database was reviewed. and patient was instructed, not to drive, operate heavy machinery, perform activities at heights, swimming or participation in water  activities or provide baby-sitting services while on Pain, Sleep and Anxiety Medications; until their outpatient Physician has advised to do so again. Also recommended to not to take more than prescribed Pain, Sleep and Anxiety Medications.  Consultants: Neurosurgery Procedures performed: Transpedicular decompression with drain and wound VAC in place Disposition: Rehabilitation facility Diet recommendation:  Discharge Diet Orders (From admission, onward)     Start     Ordered   12/10/23 0000  Diet - low sodium heart healthy        12/10/23 1014           Regular diet DISCHARGE MEDICATION: Allergies as of 12/10/2023       Reactions   Penicillin G    Other Reaction(s): syncope   Penicillins Other (See Comments)   Makes pt black out        Medication List     TAKE these medications    acetaminophen  500 MG tablet Commonly known as: TYLENOL  Take 2 tablets (1,000 mg total) by mouth every 6 (six) hours.   cyanocobalamin  1000 MCG/ML injection Commonly known as: VITAMIN B12 Inject 1 mL (1,000 mcg total) into the muscle daily for 6 days. Start taking on: December 11, 2023   docusate sodium  100 MG capsule Commonly known as: COLACE Take 1 capsule (  100 mg total) by mouth 2 (two) times daily.   Fe Fum-Vit C-Vit B12-FA Caps capsule Commonly known as: TRIGELS-F FORTE Take 1 capsule by mouth 2 (two) times daily.   methocarbamol  500 MG tablet Commonly known as: ROBAXIN  Take 1 tablet  (500 mg total) by mouth every 6 (six) hours as needed for muscle spasms.   HAIR SKIN AND NAILS FORMULA PO Take by mouth.   multivitamin with minerals tablet Take 2 tablets by mouth daily.   oxyCODONE  5 MG immediate release tablet Commonly known as: Oxy IR/ROXICODONE  Take 1 tablet (5 mg total) by mouth every 6 (six) hours as needed for moderate pain (pain score 4-6) or severe pain (pain score 7-10).   PARoxetine  20 MG tablet Commonly known as: PAXIL  TAKE 1 TABLET BY MOUTH EVERY DAY IN THE MORNING What changed: See the new instructions.   polyethylene glycol 17 g packet Commonly known as: MIRALAX  / GLYCOLAX  Take 17 g by mouth daily as needed for mild constipation.   senna 8.6 MG Tabs tablet Commonly known as: SENOKOT Take 1 tablet (8.6 mg total) by mouth 2 (two) times daily.               Discharge Care Instructions  (From admission, onward)           Start     Ordered   12/10/23 0000  Discharge wound care:       Comments: Patient is being discharged with drain and wound VAC in place, it can be pulled out while in CIR per neurosurgery.   12/10/23 1014            Discharge Exam: Filed Weights   12/06/23 1654 12/07/23 1431 12/07/23 2048  Weight: 111.1 kg 111.1 kg 108.4 kg   General.  Obese gentleman, in no acute distress. Pulmonary.  Lungs clear bilaterally, normal respiratory effort. CV.  Regular rate and rhythm, no JVD, rub or murmur. Abdomen.  Soft, nontender, nondistended, BS positive. CNS.  Alert and oriented .  No focal neurologic deficit. Extremities.  No edema, no cyanosis, pulses intact and symmetrical. Psychiatry.  Judgment and insight appears normal.   Condition at discharge: stable  The results of significant diagnostics from this hospitalization (including imaging, microbiology, ancillary and laboratory) are listed below for reference.   Imaging Studies: DG Thoracic Spine 2 View Result Date: 12/07/2023 CLINICAL DATA:  Elective surgery  EXAM: THORACIC SPINE 2 VIEWS COMPARISON:  CT 12/06/2023 FINDINGS: Multiple low resolution intraoperative spot views of the thoracic spine, in addition to limited CT views in the axial plane were obtained. Total fluoroscopic dose of 236.32 mGy. Images were obtained during posterior decompression surgery and placement of spinal hardware IMPRESSION: Intraoperative fluoroscopic assistance provided during thoracic spine surgery. Electronically Signed   By: Luke Bun M.D.   On: 12/07/2023 20:19   DG C-Arm 1-60 Min-No Report Result Date: 12/07/2023 Fluoroscopy was utilized by the requesting physician.  No radiographic interpretation.   DG C-Arm 1-60 Min-No Report Result Date: 12/07/2023 Fluoroscopy was utilized by the requesting physician.  No radiographic interpretation.   DG C-Arm 1-60 Min-No Report Result Date: 12/07/2023 Fluoroscopy was utilized by the requesting physician.  No radiographic interpretation.   CT Thoracic Spine Wo Contrast Result Date: 12/06/2023 CLINICAL DATA:  Rapidly progressive myelopathy and weakness, neurosurgery planning for OR tomorrow EXAM: CT THORACIC SPINE WITHOUT CONTRAST TECHNIQUE: Multidetector CT images of the thoracic were obtained using the standard protocol without intravenous contrast. RADIATION DOSE REDUCTION: This exam was performed according to  the departmental dose-optimization program which includes automated exposure control, adjustment of the mA and/or kV according to patient size and/or use of iterative reconstruction technique. COMPARISON:  MRI of the thoracic spine dated November 25, 2023. FINDINGS: Alignment: Normal. Vertebrae: No fracture or osseous lesion. Paraspinal and other soft tissues: Mild dependent atelectasis within the visualized lungs. Disc levels: T1-2: Left posterolateral disc osteophyte causing moderate left spinal canal and neural foraminal stenosis. T2-3: Bilateral posterolateral endplate spurring common causing moderate central spinal canal stenosis  and bilateral neural foraminal stenosis. T3-4: Bilateral uncovertebral joint hypertrophy with mild-to-moderate bilateral neural foraminal stenosis. T4-5: Mild bilateral uncovertebral joint hypertrophy with mild bilateral neural foraminal stenosis. T5-6: Negative. T6-7: Negative. T7-8: Negative. T8-9: Negative. T9-10: Negative. T10-11: Mild mild disc space narrowing. No apparent significant spinal canal or neural foraminal stenosis. T11-12: Mild disc space narrowing. No apparent significant spinal canal or neural foraminal stenosis. IMPRESSION: 1. Degenerative spurring at T2-3, resulting in moderate central spinal canal stenosis and bilateral neural foraminal stenosis. 2. Left posterolateral bulging disc osteophyte complex at T1-2, causing moderate left-sided spinal canal and neural foraminal stenosis. Electronically Signed   By: Evalene Coho M.D.   On: 12/06/2023 19:47   MR Thoracic Spine Wo Contrast Addendum Date: 11/25/2023 ADDENDUM REPORT: 11/25/2023 22:57 ADDENDUM: Critical Value/emergent results were called by telephone at the time of interpretation on 11/25/2023 at 8:20 p.m. to provider NORLEEN JOBS , who verbally acknowledged these results. Electronically Signed   By: Morene Hoard M.D.   On: 11/25/2023 22:57   Result Date: 11/25/2023 CLINICAL DATA:  Initial evaluation for acute myelopathy. EXAM: MRI THORACIC SPINE WITHOUT CONTRAST TECHNIQUE: Multiplanar, multisequence MR imaging of the thoracic spine was performed. No intravenous contrast was administered. COMPARISON:  None Available. FINDINGS: Alignment: Trace scoliosis. Alignment otherwise normal preservation of the normal thoracic kyphosis. No listhesis. Vertebrae: Vertebral body height maintained without acute or chronic fracture. Bone marrow signal intensity overall within normal limits. No worrisome osseous lesions. No abnormal marrow edema. Cord: Patchy cord signal abnormality seen at the level of T2-3, concerning for edema and/or  myelomalacia (series 106, image 10). Otherwise normal signal and morphology. Paraspinal and other soft tissues: Unremarkable. Disc levels: T1-2: Diffuse disc bulge with reactive endplate spurring. Superimposed left paracentral disc protrusion (series 108, image 5). Left-sided facet hypertrophy. Resultant mild spinal stenosis with mild cord flattening, but no cord signal changes. Foramina appear patent. T2-3: Diffuse disc bulge with reactive endplate spurring. Moderate bilateral facet hypertrophy. Resultant severe spinal stenosis. Secondary cord flattening/compression with associated cord signal changes (series 108, image 9). Moderate bilateral foraminal narrowing. T3-4: Diffuse disc bulge with reactive endplate spurring. Mild facet hypertrophy. Resultant mild spinal stenosis. Moderate bilateral foraminal narrowing. T4-5: Disc bulge with endplate spurring. Mild facet hypertrophy. No significant spinal stenosis. Mild right foraminal narrowing. Left neural foramina appears patent. T5-6: Mild disc bulge with endplate spurring. Right-sided facet hypertrophy. No significant stenosis. T6-7: Mild disc bulge, asymmetric to the left. Mild facet hypertrophy. No significant canal or foraminal stenosis. T7-8: Mild disc bulge. Right-sided facet hypertrophy. No significant stenosis. T8-9: Minimal disc bulge. Mild right-sided facet hypertrophy. No significant stenosis. T9-10: Mild diffuse disc bulge superimposed small right paracentral disc protrusion (series 108, image 31). Mild right-sided facet hypertrophy. No significant spinal stenosis. Foramina appear patent. T10-11: Diffuse disc bulge with reactive endplate spurring. Right worse than left facet hypertrophy. No significant spinal stenosis. Moderate right foraminal narrowing. Left neural foramina remains patent. T11-12: Disc bulge with endplate spurring. Mild facet hypertrophy. No significant spinal stenosis. Severe  right foraminal narrowing. Left neural foramina remains  patent. T12-L1:  Negative interspace.  Mild facet hypertrophy.  No stenosis. IMPRESSION: 1. Multilevel thoracic spondylosis with resultant diffuse spinal stenosis at T1-2 through T3-4, severe at the T2-3 level. Secondary cord flattening/compression at T2-3 with associated cord signal changes, consistent with edema and/or myelomalacia. 2. Multifactorial degenerative changes with resultant multilevel foraminal narrowing as above. Notable findings include moderate bilateral foraminal narrowing at T2-3 and T3-4, with severe right foraminal narrowing at T11-12. Current attempt is being made to contact the ordering clinician. Results will be conveyed as soon as possible. Electronically Signed: By: Morene Hoard M.D. On: 11/25/2023 20:09   XR Lumb Spine Flex&Ext Only Result Date: 11/22/2023 XRs of the lumbar spine from 11/22/2023 were independently reviewed and interpreted, showing posterior instrumentation at L5 and S1. No lucency seen around the screws.  Interbody device in the former L5/S1 disc space that appears appropriate position. Anterior osteophyte formation seen at L4/5.  No fracture or dislocation seen.  MR Lumbar Spine w/o contrast Result Date: 11/11/2023 CLINICAL DATA:  Low back pain, symptoms persist with > 6 wks treatment. Bilateral lower extremity weakness and tightness. Back tightness. History of lumbar surgery. EXAM: MRI LUMBAR SPINE WITHOUT CONTRAST TECHNIQUE: Multiplanar, multisequence MR imaging of the lumbar spine was performed. No intravenous contrast was administered. COMPARISON:  Lumbar spine radiographs 11/03/2023. Chest radiographs 04/01/2004. CT abdomen and pelvis 01/12/2021. MRI lumbar spine 05/27/2007 (report only). FINDINGS: Segmentation: Transitional lumbosacral anatomy with partial lumbarization of S1. Alignment:  Normal. Vertebrae: No fracture, suspicious marrow lesion, or significant marrow edema. Prior posterior and interbody fusion at L5-S1. Conus medullaris and cauda  equina: Conus extends to the L1-2 level. Conus and cauda equina appear normal. Paraspinal and other soft tissues: Postoperative changes in the posterior lower lumbar soft tissues. No fluid collection. Disc levels: Diffuse congenital narrowing of the lumbar spinal canal due to short pedicles. T12-L1: Only imaged sagittally and negative. L1-2: Negative. L2-3: Mild disc bulging, dorsal epidural lipomatosis, and mild facet and ligamentum flavum hypertrophy result in mild spinal stenosis without neural foraminal stenosis. L3-4: Disc bulging, epidural lipomatosis, and mild to moderate facet and ligamentum flavum hypertrophy result in mild spinal stenosis and minimal bilateral neural foraminal narrowing. L4-5: Disc desiccation and moderate disc space narrowing. Circumferential disc bulging mildly eccentric to the left and severe facet and ligamentum flavum hypertrophy result in severe spinal stenosis, asymmetric right lateral recess stenosis, and severe right and moderate left neural foraminal stenosis. L5-S1: Prior posterior decompression and fusion. No significant stenosis. S1-2: Transitional anatomy with rudimentary disc.  No stenosis. IMPRESSION: 1. Transitional lumbosacral anatomy. 2. Severe spinal stenosis and severe right and moderate left neural foraminal stenosis at L4-5. 3. Mild spinal stenosis at L2-3 and L3-4. 4. L5-S1 fusion without stenosis. Electronically Signed   By: Dasie Hamburg M.D.   On: 11/11/2023 16:44    Microbiology: Results for orders placed or performed during the hospital encounter of 12/06/23  Surgical PCR screen     Status: None   Collection Time: 12/07/23  8:34 AM   Specimen: Nasal Mucosa; Nasal Swab  Result Value Ref Range Status   MRSA, PCR NEGATIVE NEGATIVE Final   Staphylococcus aureus NEGATIVE NEGATIVE Final    Comment: (NOTE) The Xpert SA Assay (FDA approved for NASAL specimens in patients 42 years of age and older), is one component of a comprehensive surveillance program.  It is not intended to diagnose infection nor to guide or monitor treatment. Performed at [facility], [phone]  [address] [place], [place] [address]     Labs: CBC: Recent Labs  Lab 12/06/23 1700 12/07/23 0507 12/07/23 0848 12/08/23 0312 12/09/23 0245  WBC 6.5 6.3 5.0 9.7  --   NEUTROABS 3.7  --   --   --   --   HGB 13.3 12.6* 12.9* 12.0* 10.2*  HCT 42.0 39.3 40.2 37.8*  --   MCV 85.2 86.0 85.0 86.1  --   PLT 263 254 254 240  --    Basic Metabolic Panel: Recent Labs  Lab 12/06/23 1700 12/07/23 0507 12/07/23 0848 12/08/23 0312  NA 140 142 142 139  K 3.9 3.5 4.0 4.6  CL 103 104 106 105  CO2 27 27 28 26   GLUCOSE 89 124* 115* 160*  BUN 15 15 14 15   CREATININE 1.14 1.29* 1.23 1.19  CALCIUM  9.3 8.8* 8.8* 8.5*  MG  --   --   --  1.9   Liver Function Tests: No results for input(s): AST, ALT, ALKPHOS, BILITOT, PROT, ALBUMIN  in the last 168 hours. CBG: No results for input(s): GLUCAP in the last 168 hours.  Discharge time spent: greater than 30 minutes.  This record has been created using Conservation officer, historic buildings. Errors have been sought and corrected,but may not always be located. Such creation errors do not reflect on the standard of care.   Signed: Amaryllis Dare, MD [facility] 12/10/2023

## 2023-12-10 NOTE — Discharge Instructions (Signed)
  Your surgeon has performed an operation on your thoracic spine to relieve pressure on the spinal cord and/or nerves.  This involves making an incision on the back of your neck and removing bone and placing screws and rods to hold things in place while you heal.  The following are instructions to help in your recovery once you have been discharged from the hospital. Even if you feel well, it is important that you follow these activity guidelines. If you do not let your upper back/neck heal properly from the surgery, you can increase the chance of return of your symptoms and other complications.   Activity   Activity per inpatient rehab.  You do have a drain in place to collect postoperative fluid.  This will be at the correct level for removal tomorrow.  It put out 100 cc over the last 24 hours so would like to keep it in for an additional day.  Surgical Incision   There is a wound VAC in place, this can be removed when it loses suction or when the battery has stopped at the 6-day mark.  Whichever 1 of these times is sooner.  When to Contact Us   You may experience pain in your neck and/or pain between your shoulder blades. This is normal and should improve in the next few weeks with the help of pain medication, muscle relaxers, and rest. Some patients report that a warm compress on the back of the neck or between the shoulder blades helps.  However, should you experience any of the following, contact us  immediately: New numbness or weakness Pain that is progressively getting worse, and is not relieved by your pain medication, muscle relaxers, rest, and warm compresses Bleeding, redness, swelling, pain, or drainage from surgical incision Chills or flu-like symptoms Fever greater than 101.0 F (38.3 C) Inability to eat, drink fluids, or take medications Problems with bowel or bladder functions Difficulty breathing or shortness of breath Warmth, tenderness, or swelling in your calf Contact  Information How to contact us :  If you have any questions/concerns before or after surgery, you can reach us  at [phone], or you can send a mychart message. We can be reached by phone or mychart 8am-4pm, Monday-Friday.  *Please note: Calls after 4pm are forwarded to a third party answering service. Mychart messages are not routinely monitored during evenings, weekends, and holidays. Please call our office to contact the answering service for urgent concerns during non-business hours.    We will also be in direct contact with your inpatient rehabilitation team.

## 2023-12-10 NOTE — Progress Notes (Signed)
   Neurosurgery Progress Note  History: Corey [name] is s/p T3 transpedicular decompression T1-4 PSF  POD3: Patient doing well, feeling stronger in his legs than he was preoperatively.  He has had some areas of hypotension bradycardia, potentially spinal etiology.  Preoperatively his T2 signal change did extend up to the upper thoracic spine so could potentially interfere with his sympathetic innervation. POD2: Pt experienced an episode of bradycardia yesterday afternoon and a rapid was called. His heart rate and blood pressure recovered independently. He is otherwise doing well this morning  POD1: pt continues to have some spasms in his legs overnight but feels his strength is somewhat better and the numbness in his legs has improved  Physical Exam: Vitals:   12/10/23 0353 12/10/23 0757  BP: 116/72 114/66  Pulse: 83 82  Resp: 16 18  Temp: 98.2 F (36.8 C) 98.6 F (37 C)  SpO2: 100% 98%    AA Ox3 CNI  Side Iliopsoas Quads Hamstring PF DF EHL   R 4- 4- 5 4 4+  4+  L 4 4+ 5 4+ 4+  4-   HV 135 over the last 24 hrs Data:  Other tests/results:     Latest Ref Rng & Units 12/09/2023    2:45 AM 12/08/2023    3:12 AM 12/07/2023    8:48 AM  CBC  WBC 4.0 - 10.5 K/uL  9.7  5.0   Hemoglobin 13.0 - 17.0 g/dL 89.7  87.9  87.0   Hematocrit 39.0 - 52.0 %  37.8  40.2   Platelets 150 - 400 K/uL  240  254       Latest Ref Rng & Units 12/08/2023    3:12 AM 12/07/2023    8:48 AM 12/07/2023    5:07 AM  BMP  Glucose 70 - 99 mg/dL 839  884  875   BUN 6 - 20 mg/dL 15  14  15    Creatinine 0.61 - 1.24 mg/dL 8.80  8.76  8.70   Sodium 135 - 145 mmol/L 139  142  142   Potassium 3.5 - 5.1 mmol/L 4.6  4.0  3.5   Chloride 98 - 111 mmol/L 105  106  104   CO2 22 - 32 mmol/L 26  28  27    Calcium  8.9 - 10.3 mg/dL 8.5  8.8  8.8    Output by Drain (mL) 12/08/23 0701 - 12/08/23 1900 12/08/23 1901 - 12/09/23 0700 12/09/23 0701 - 12/09/23 1900 12/09/23 1901 - 12/10/23 0700 12/10/23 0701 - 12/10/23 0855   Closed System Drain 1 Right;Superior Back Accordion (Hemovac) 10 Fr. 75 60 70 30   Negative Pressure Wound Therapy Back Posterior;Upper          Assessment/Plan:  Corey [name] is a 58 y.o presenting with progressive thoracic myelopathy s/p T3 transpedicular decompression T1-4 PSF   - mobilize - pain control - DVT prophylaxis - will continue HV for now. Drain output, 100y/d, will be ready for removal tomorrow.  Okay for removal at IPR if needed - will continue wound vac, once suction comes down then can remove his dressing. - PTOT - remainder of care per primary team  Penne MICAEL Sharps, MD Department of Neurosurgery

## 2023-12-10 NOTE — Progress Notes (Signed)
 Signed     Expand All Collapse All PMR Admission Coordinator Pre-Admission Assessment   Patient: Corey [name] is an 58 y.o., male MRN: [id number] DOB: June 13, 1965 Height: 5' 9 (175.3 cm) Weight: 108.4 kg   Insurance Information HMO:     PPO: yes     PCP:      IPA:      80/20:      OTHER:  PRIMARY: [employer] [place] PPO      Policy#: [id number]      Subscriber: Pt    Patient approved for admisison on 12/09/23 for admit 12/10/23-12/13/23 CM Name:       Phone#: [phone]     Fax#:  [phone] (concurrent fax is still [phone])) Pre-Cert#: [id number]      Employer:  Benefits:  Phone #:      Name:  Eff Date: 06/02/2023 - 05/31/9998 Deductible: does not have one OOP Max: $7,500 ($520.16 met) CIR: $350/day co-pay/admission for days 1-5 SNF: no coverage Outpatient:  $35 co-pay/visit Home Health:  70% coverage, 30% co-insurance DME: 70% coverage, 30% co-insurance Providers: in network    SECONDARY:       Policy#:      Phone#:    Artist:       Phone#:    The Data processing manager" for patients in Inpatient Rehabilitation Facilities with attached "Privacy Act Statement-Health Care Records" was provided and verbally reviewed with: Patient   Emergency Contact Information Contact Information       Name Relation Home Work Mobile    Fee,[name] Spouse     [phone]         Other Contacts   None on File        Current Medical History  Patient Admitting Diagnosis: Thoracic Myelopathy History of Present Illness: [name] is a 58 year old male with history of thoracic myelopathy who presented to [facility]  emergency department 12/06/23 for progressive weakness. He was evaluated by neurosurgery outpatient and was sent to the emergency department for expedited admission and treatment of severe progressive thoracic myelopathy. Vitals in the ED showed temperature of 99.4, respiration rate of 18, heart rate 80, blood pressure 132/91,  SpO2 99% on room air.He denies trauma to his person.  He reports he has been having bilateral lower extremity weakness for about 1 month.  He reports it has progressively worsened.On 12/07/23, Pt.  Underwent emergent T3 transpedicular decompression T1-4 PSF. He was seen by PT/OT and they recommended CIR to assist return to PLOF.      Serum sodium is 140, potassium 3.9, chloride 103, bicarb 27, BUN of 15, serum creatinine 1.14, EGFR greater than 60, nonfasting blood glucose 89, WBC 6.5, hemoglobin 13.3, platelets of 263.   Patient's medical record from [facility]  has been reviewed by the rehabilitation admission coordinator and physician.   Past Medical History      Past Medical History:  Diagnosis Date   Arthritis      knee, left   ED (erectile dysfunction)     Hyperlipidemia     Hypertension     Sleep apnea      CPAP - not using lately due to recall           Has the patient had major surgery during 100 days prior to admission? Yes   Family History   family history includes Alzheimer's disease in his mother; Autism in his brother; Colon cancer in an other family member; Stevens-Johnson syndrome in his  father.   Current Medications  Current Medications    Current Facility-Administered Medications:    0.9 %  sodium chloride  infusion, 250 mL, Intravenous, Continuous, Gregory, Edsel Ruth, PA   acetaminophen  (TYLENOL ) tablet 650 mg, 650 mg, Oral, Q4H PRN **OR** acetaminophen  (TYLENOL ) suppository 650 mg, 650 mg, Rectal, Q4H PRN, Gregory Edsel Ruth, PA   acetaminophen  (TYLENOL ) tablet 1,000 mg, 1,000 mg, Oral, Q6H, Gregory Edsel Ruth, PA, 1,000 mg at 12/08/23 1231   docusate sodium  (COLACE) capsule 100 mg, 100 mg, Oral, BID, Gregory Edsel Ruth, PA, 100 mg at 12/08/23 0935   enoxaparin  (LOVENOX ) injection 40 mg, 40 mg, Subcutaneous, Q24H, Gregory Edsel Ruth, [place], 40 mg at 12/08/23 0935   magnesium  citrate solution 1 Bottle, 1 Bottle, Oral, Once PRN, Gregory Edsel Ruth, PA   menthol -cetylpyridinium (CEPACOL) lozenge 3 mg, 1 lozenge, Oral, PRN **OR** phenol (CHLORASEPTIC) mouth spray 1 spray, 1 spray, Mouth/Throat, PRN, Gregory Edsel Ruth, PA   methocarbamol  (ROBAXIN ) tablet 500 mg, 500 mg, Oral, Q6H PRN **OR** methocarbamol  (ROBAXIN ) injection 500 mg, 500 mg, Intravenous, Q6H PRN, Gregory Edsel Ruth, PA   mupirocin  ointment (BACTROBAN ) 2 % 1 Application, 1 Application, Nasal, BID, [place], MD, 1 Application at 12/08/23 0935   ondansetron  (ZOFRAN ) tablet 4 mg, 4 mg, Oral, Q6H PRN **OR** ondansetron  (ZOFRAN ) injection 4 mg, 4 mg, Intravenous, Q6H PRN, Gregory Edsel Ruth, PA   oxyCODONE  (Oxy IR/ROXICODONE ) immediate release tablet 10 mg, 10 mg, Oral, Q3H PRN, Gregory Edsel Ruth, PA   oxyCODONE  (Oxy IR/ROXICODONE ) immediate release tablet 5 mg, 5 mg, Oral, Q3H PRN, Gregory Edsel Ruth, PA   PARoxetine  (PAXIL ) tablet 20 mg, 20 mg, Oral, Daily, Cox, Amy N, DO, 20 mg at 12/08/23 0935   polyethylene glycol (MIRALAX  / GLYCOLAX ) packet 17 g, 17 g, Oral, Daily PRN, Gregory Edsel Ruth, PA   senna (SENOKOT) tablet 8.6 mg, 1 tablet, Oral, BID, Gregory Edsel Ruth, PA, 8.6 mg at 12/08/23 0935   sodium chloride  flush (NS) 0.9 % injection 3 mL, 3 mL, Intravenous, Q12H, Gregory Edsel Ruth, PA, 3 mL at 12/08/23 0800   sodium chloride  flush (NS) 0.9 % injection 3 mL, 3 mL, Intravenous, PRN, Gregory Edsel Ruth, PA   sorbitol  70 % solution 30 mL, 30 mL, Oral, Daily PRN, Gregory Edsel Ruth, PA     Patients Current Diet:  Diet Order                  Diet regular Room service appropriate? Yes; Fluid consistency: Thin  Diet effective now                         Precautions / Restrictions Precautions Precautions: Fall, Back Precaution Booklet Issued: Yes (comment) Precaution/Restrictions Comments: spinal precautions, no brace needed per orders Restrictions Weight Bearing Restrictions Per Provider Order: No    Has the patient had 2 or more falls or a fall with injury  in the past year? Yes   Prior Activity Level  Pt working full time PTA   Prior Functional Level Self Care: Did the patient need help bathing, dressing, using the toilet or eating? Independent   Indoor Mobility: Did the patient need assistance with walking from room to room (with or without device)? Independent   Stairs: Did the patient need assistance with internal or external stairs (with or without device)? Independent   Functional Cognition: Did the patient need help planning regular tasks such as shopping or remembering to take medications? Independent  Patient Information Are you of Hispanic, Latino/a,or Spanish origin?: A. No, not of Hispanic, Latino/a, or Spanish origin What is your race?: B. Black or African American Do you need or want an interpreter to communicate with a doctor or health care staff?: 0. No   Patient's Response To:  Health Literacy and Transportation Is the patient able to respond to health literacy and transportation needs?: Yes Health Literacy - How often do you need to have someone help you when you read instructions, pamphlets, or other written material from your doctor or pharmacy?: Never In the past 12 months, has lack of transportation kept you from medical appointments or from getting medications?: No In the past 12 months, has lack of transportation kept you from meetings, work, or from getting things needed for daily living?: No   Home Assistive Devices / Equipment Home Equipment: Agricultural consultant (2 wheels), [employer] - single point, Rollator (4 wheels), BSC/3in1, Shower seat   Prior Device Use: Indicate devices/aids used by the patient prior to current illness, exacerbation or injury? Manual wheelchair and Walker   Current Functional Level Cognition   Orientation Level: Oriented X4    Extremity Assessment (includes Sensation/Coordination)   Upper Extremity Assessment: RUE deficits/detail, Right hand dominant, LUE deficits/detail RUE Deficits /  Details: limited in assessment by pain with AROM, did not formally assess MMT RUE: Unable to fully assess due to pain, Shoulder pain with ROM RUE Sensation: WNL LUE: Unable to fully assess due to pain, Shoulder pain with ROM LUE Sensation: WNL  Lower Extremity Assessment: Defer to PT evaluation     ADLs   Overall ADL's : Needs assistance/impaired Lower Body Dressing: Sit to/from stand, Moderate assistance, Sitting/lateral leans, Cueing for sequencing, Cueing for safety, With adaptive equipment Lower Body Dressing Details (indicate cue type and reason): educated pt on use of AE for LB dressing with pt and spouse verbalizing understanding Toilet Transfer: Minimal assistance, Rolling walker (2 wheels), BSC/3in1 Toileting- Clothing Manipulation and Hygiene: Maximal assistance, Sit to/from stand, Cueing for sequencing, Cueing for safety, Adhering to back precautions Functional mobility during ADLs: Minimal assistance, Rolling walker (2 wheels), Cueing for sequencing, Cueing for safety General ADL Comments: pt significantly far from baseline, anticipate up to MAX A for LB ADLs     Mobility   Overal bed mobility: Needs Assistance Bed Mobility: Rolling, Sidelying to Sit Rolling: Min assist, Used rails Sidelying to sit: Min assist, Used rails General bed mobility comments: NT, pt recieved and left in recliner     Transfers   Overall transfer level: Needs assistance Equipment used: Rolling walker (2 wheels) Transfers: Sit to/from Stand Sit to Stand: Min assist General transfer comment: cues for technique, steadying assist     Ambulation / Gait / Stairs / Wheelchair Mobility   Ambulation/Gait Ambulation/Gait assistance: Contact guard assist Gait Distance (Feet):  (2 bouts of ~79ft) Assistive device: Rolling walker (2 wheels) General Gait Details: chair follow. noted for intermittent BLE buckling/weakness. HR 120s. reliant on RW for balance. pt very motivated to increase distance as able Gait  velocity: decreased     Posture / Balance Balance Overall balance assessment: Needs assistance Sitting-balance support: Feet supported Sitting balance-Leahy Scale: Fair Standing balance support: Bilateral upper extremity supported, During functional activity, Reliant on assistive device for balance Standing balance-Leahy Scale: Poor     Special needs/care consideration Skin surgical incision    Previous Home Environment (from acute therapy documentation) Living Arrangements: Spouse/significant other Available Help at Discharge: Family, Available 24 hours/day Type of Home:  House Home Layout: One level Home Access: Stairs to enter Entrance Stairs-Rails: None Entrance Stairs-Number of Steps: 2 Bathroom Shower/Tub: Engineer, manufacturing systems: Yes How Accessible: Accessible via walker Home Care Services: No Additional Comments: mutiple daily falls due to progressive leg weakness,  10+ since beginning of June   Discharge Living Setting Plans for Discharge Living Setting: Patient's home Type of Home at Discharge: House Discharge Home Layout: One level Discharge Home Access: Stairs to enter Entrance Stairs-Rails: None Entrance Stairs-Number of Steps: 2 Discharge Bathroom Shower/Tub: Tub/shower unit Discharge Bathroom Toilet: Standard Discharge Bathroom Accessibility: Yes How Accessible: Other (comment), Accessible via walker Does the patient have any problems obtaining your medications?: No   Social/Family/Support Systems Patient Roles: Spouse Contact Information: [phone] Anticipated Caregiver: Princella Ability/Limitations of Caregiver: 24/7 min a Caregiver Availability: 24/7 Discharge Plan Discussed with Primary Caregiver: Yes Is Caregiver In Agreement with Plan?: No Does Caregiver/Family have Issues with Lodging/Transportation while Pt is in Rehab?: No   Goals Patient/Family Goal for Rehab: PT/OT mod I Expected length of stay: 10-12  days Pt/Family Agrees to Admission and willing to participate: Yes Program Orientation Provided & Reviewed with Pt/Caregiver Including Roles  & Responsibilities: Yes   Decrease burden of Care through IP rehab admission: Not anticipated   Possible need for SNF placement upon discharge: Not anticipated   Patient Condition: I have reviewed medical records from [facility], spoken with CM, and patient and spouse. I discussed via phone for inpatient rehabilitation assessment.  Patient will benefit from ongoing PT and OT, can actively participate in 3 hours of therapy a day 5 days of the week, and can make measurable gains during the admission.  Patient will also benefit from the coordinated team approach during an Inpatient Acute Rehabilitation admission.  The patient will receive intensive therapy as well as Rehabilitation physician, nursing, social worker, and care management interventions.  Due to safety, skin/wound care, disease management, medication administration, pain management, and patient education the patient requires 24 hour a day rehabilitation nursing.  The patient is currently Min A +2 with mobility and Mod A with basic ADLs.  Discharge setting and therapy post discharge at home with home health is anticipated.  Patient has agreed to participate in the Acute Inpatient Rehabilitation Program and will admit today.   Preadmission Screen Completed By:  Leita KATHEE Kleine, 12/08/2023 1:31 PM ______________________________________________________________________   Discussed status with Dr. Emeline on 12/10/23  at 10:22 AM and received approval for admission today.   Admission Coordinator:  Leita KATHEE Kleine, CCC-SLP, time 10:22 AM/Date 12/10/23     Assessment/Plan: Diagnosis: Thoracic myelopathy status post T1-4 PSF 7-8 Does the need for close, 24 hr/day Medical supervision in concert with the patient's rehab needs make it unreasonable for this patient to be served in a less  intensive setting? Yes Co-Morbidities requiring supervision/potential complications: Complex postoperative wound with wound VAC, anemia of chronic disease, OSA on CPAP, morbid obesity, depression, poor pain control, constipation, hyperglycemia Due to bladder management, bowel management, safety, skin/wound care, disease management, medication administration, pain management, and patient education, does the patient require 24 hr/day rehab nursing? Yes Does the patient require coordinated care of a physician, rehab nurse, PT, OT  to address physical and functional deficits in the context of the above medical diagnosis(es)? Yes Addressing deficits in the following areas: balance, endurance, locomotion, strength, transferring, bowel/bladder control, bathing, dressing, feeding, grooming, and toileting Can the patient actively participate in an intensive therapy program of at least  3 hrs of therapy 5 days a week? Yes The potential for patient to make measurable gains while on inpatient rehab is good Anticipated functional outcomes upon discharge from inpatient rehab: modified independent PT, modified independent OT Estimated rehab length of stay to reach the above functional goals is: 10-12 days Anticipated discharge destination: Home 10. Overall Rehab/Functional Prognosis: good     MD Signature:   Joesph JAYSON Likes, DO 12/10/2023

## 2023-12-10 NOTE — Plan of Care (Signed)

## 2023-12-10 NOTE — H&P (Incomplete)
 Physical Medicine and Rehabilitation Admission H&P        Chief Complaint  Patient presents with  . Back Pain  : HPI: Corey [name] is a 58 year old right-handed male with history of hypertension on no antihypertensive medication, hyperlipidemia currently on no statin drugs, depression, sleep apnea, left total knee arthroplasty by Dr. Oneil Herald 11/11/2020 as well as past surgical microdiscectomy L5-S1.  Per chart review patient lives with spouse.  1 level home 2 steps to enter.  Independent ADLs and mobility but requiring more physical assist over the last several weeks with noted falls due to increasing weakness.  Presented 12/06/2023 progressive sensation loss in bilateral lower extremities that started in his feet progressing up to his mid abdomen.  MRI imaging revealed multilevel thoracic spondylosis with resulted diffuse spinal stenosis at T1-2 through T3-4, severe at the T2-3 level.  Secondary cord flattening/compression at T2-3 with associated cord signal changes consistent with edema/myelomalacia.  Multifactorial degenerative changes with resultant multilevel foraminal narrowing.  Notable findings include moderate bilateral foraminal narrowing at T2-3 and T3-4 severe right foraminal narrowing at T11-12.  Underwent T3 transpedicular decompression, T1-T4 PSF on 12/07/2023 per neurosurgery Dr. Penne Sharps.  Postoperative drain and wound VAC placed.  Plan is for drain to be removed 12/11/2022 and wound VAC can be removed when loses suction or when the battery runs down.NO BRACE REQUIRED.  Placed on Lovenox  or DVT prophylaxis.  Therapy evaluations completed and due to patient's decreased functional mobility was admitted for a comprehensive rehab program.   Review of Systems  Constitutional:  Negative for chills and fever.  HENT:  Negative for hearing loss.   Eyes:  Negative for blurred vision and double vision.  Respiratory:  Negative for cough, shortness of breath and wheezing.    Cardiovascular:  Negative for chest pain, palpitations and leg swelling.  Gastrointestinal:  Positive for constipation. Negative for heartburn, nausea and vomiting.  Genitourinary:  Positive for urgency. Negative for dysuria, flank pain and hematuria.  Musculoskeletal:  Positive for back pain, falls and myalgias.  Skin:  Negative for rash.  Neurological:  Positive for sensory change and weakness.  Psychiatric/Behavioral:  Positive for depression.   All other systems reviewed and are negative.      Past Medical History:  Diagnosis Date  . Arthritis      knee, left  . ED (erectile dysfunction)    . Hyperlipidemia    . Hypertension    . Sleep apnea      CPAP - not using lately due to recall              Past Surgical History:  Procedure Laterality Date  . ANKLE BONE SURGERY      . APPLICATION OF INTRAOPERATIVE CT SCAN   12/07/2023    Procedure: APPLICATION OF INTRAOPERATIVE CT SCAN;  Surgeon: Sharps Penne ORN, MD;  Location: [facility] ORS;  Service: Neurosurgery;;  . BACK SURGERY      . COLLAR BONE SURGEY      . COLONOSCOPY      . FRACTURE SURGERY      . HERNIA REPAIR      . KNEE ARTHROSCOPY Left 2006  . LAPAROSCOPIC APPENDECTOMY N/A 01/13/2021    Procedure: APPENDECTOMY LAPAROSCOPIC WITH LYSIS OF ADHESIONS;  Surgeon: Sheldon Standing, MD;  Location: [facility] OR[facility];  Service: General;  Laterality: N/A;  . LUMBAR MICRODISCECTOMY      . POLYPECTOMY      . SKIN BIOPSY Left 08/11/2021  nodulocystic fat nercrosis  . TOTAL KNEE ARTHROPLASTY Left 11/11/2020    Procedure: LEFT TOTAL KNEE ARTHROPLASTY;  Surgeon: Barbarann Oneil BROCKS, MD;  Location: [facility] OR;  Service: Orthopedics;  Laterality: Left;  . UMBILICAL HERNIA REPAIR                 Family History  Problem Relation Age of Onset  . Alzheimer's disease Mother    . Stevens-Johnson syndrome Father    . Autism Brother    . Colon cancer Other    . Colon polyps Neg Hx    . Esophageal cancer Neg Hx    . Rectal cancer Neg Hx    . Stomach cancer  Neg Hx          Social History:  reports that he has never smoked. He has never used smokeless tobacco. He reports current alcohol use of about 3.0 standard drinks of alcohol per week. He reports that he does not currently use drugs after having used the following drugs: Marijuana. Allergies:  Allergies       Allergies  Allergen Reactions  . Penicillin G        Other Reaction(s): syncope  . Penicillins Other (See Comments)      Makes pt black out            Medications Prior to Admission  Medication Sig Dispense Refill  . acetaminophen  (TYLENOL ) 500 MG tablet Take 2 tablets (1,000 mg total) by mouth every 6 (six) hours. 30 tablet 0  . [START ON 12/11/2023] cyanocobalamin  (VITAMIN B12) 1000 MCG/ML injection Inject 1 mL (1,000 mcg total) into the muscle daily for 6 days.      . docusate sodium  (COLACE) 100 MG capsule Take 1 capsule (100 mg total) by mouth 2 (two) times daily.      . Fe Fum-Vit C-Vit B12-FA (TRIGELS-F FORTE) CAPS capsule Take 1 capsule by mouth 2 (two) times daily.      . methocarbamol  (ROBAXIN ) 500 MG tablet Take 1 tablet (500 mg total) by mouth every 6 (six) hours as needed for muscle spasms. 30 tablet 0  . Multiple Vitamins-Minerals (HAIR SKIN AND NAILS FORMULA PO) Take by mouth.      . Multiple Vitamins-Minerals (MULTIVITAMIN WITH MINERALS) tablet Take 2 tablets by mouth daily.      . oxyCODONE  (OXY IR/ROXICODONE ) 5 MG immediate release tablet Take 1 tablet (5 mg total) by mouth every 6 (six) hours as needed for moderate pain (pain score 4-6) or severe pain (pain score 7-10). 20 tablet 0  . PARoxetine  (PAXIL ) 20 MG tablet TAKE 1 TABLET BY MOUTH EVERY DAY IN THE MORNING (Patient taking differently: Take 20 mg by mouth in the morning.) 90 tablet 1  . polyethylene glycol (MIRALAX  / GLYCOLAX ) 17 g packet Take 17 g by mouth daily as needed for mild constipation.      . senna (SENOKOT) 8.6 MG TABS tablet Take 1 tablet (8.6 mg total) by mouth 2 (two) times daily.                   Home: Home Living Family/patient expects to be discharged to:: Private residence Living Arrangements: Spouse/significant other Available Help at Discharge: Family, Available 24 hours/day Type of Home: House Home Access: Stairs to enter [employer] of Steps: 2 Entrance Stairs-Rails: None Home Layout: One level Bathroom Shower/Tub: Health visitor: Standard Bathroom Accessibility: Yes Home Equipment: Agricultural consultant (2 wheels), [employer] - single point, Rollator (4 wheels), BSC/3in1, Shower seat Additional  Comments: mutiple daily falls due to progressive leg weakness,  10+ since beginning of June   Functional History: Prior Function Prior Level of Function : Working/employed, Driving, Independent/Modified Independent Mobility Comments: prior to onset of weakness, independent, working full time. in the last week started using rollator ADLs Comments: prior to beginning of June, pt completely independent. pt now requires assist for most BADLs (showers, LB dressing) due to BLE weakness   Functional Status:  Mobility: Bed Mobility Overal bed mobility: Needs Assistance Bed Mobility: Rolling, Sidelying to Sit Rolling: Min assist, Used rails Sidelying to sit: Mod assist, Used rails General bed mobility comments: NT, pt recieved and left in recliner Transfers Overall transfer level: Needs assistance Equipment used: Rolling walker (2 wheels) Transfers: Sit to/from Stand Sit to Stand: Min assist, +2 physical assistance General transfer comment: cues for technique, steadying assist Ambulation/Gait Ambulation/Gait assistance: Contact guard assist Gait Distance (Feet):  (2 bouts of ~15ft) Assistive device: Rolling walker (2 wheels) General Gait Details: unable to ambulate due to light headedness Gait velocity: decreased   ADL: ADL Overall ADL's : Needs assistance/impaired Lower Body Dressing: Sit to/from stand, Moderate assistance, Sitting/lateral leans,  Cueing for sequencing, Cueing for safety, With adaptive equipment Lower Body Dressing Details (indicate cue type and reason): educated pt on use of AE for LB dressing with pt and spouse verbalizing understanding Toilet Transfer: Minimal assistance, Rolling walker (2 wheels), BSC/3in1 Toileting- Clothing Manipulation and Hygiene: Maximal assistance, Sit to/from stand, Cueing for sequencing, Cueing for safety, Adhering to back precautions Functional mobility during ADLs: Minimal assistance, Rolling walker (2 wheels), Cueing for sequencing, Cueing for safety General ADL Comments: pt significantly far from baseline, anticipate up to MAX A for LB ADLs   Cognition: Cognition Orientation Level: Oriented X4 Cognition Arousal: Alert Behavior During Therapy: WFL for tasks assessed/performed   Physical Exam: Blood pressure 114/66, pulse 82, temperature 98.6 F (37 C), resp. rate 18, height 5' 9 (1.753 m), weight 108.4 kg, SpO2 98%. Physical Exam Skin:    Comments: Back incision with wound drain as well as VAC in place.  Neurological:     Comments: Patient is alert and oriented.  Following commands.       Lab Results Last 48 Hours        Results for orders placed or performed during the hospital encounter of 12/06/23 (from the past 48 hours)  Hemoglobin     Status: Abnormal    Collection Time: 12/09/23  2:45 AM  Result Value Ref Range    Hemoglobin 10.2 (L) 13.0 - 17.0 g/dL      Comment: Performed at [facility], [address] [place], [place] [address]  Vitamin B12     Status: Abnormal    Collection Time: 12/09/23 10:27 AM  Result Value Ref Range    Vitamin B-12 131 (L) 180 - 914 pg/mL      Comment: (NOTE) This assay is not validated for testing neonatal or myeloproliferative syndrome specimens for Vitamin B12 levels. Performed at [facility], [address], [place], [place] [address]    Folate     Status: Abnormal    Collection Time: 12/09/23 10:27 AM  Result  Value Ref Range    Folate 5.3 (L) >5.9 ng/mL      Comment: Performed at [facility], [address] [place], [place] [address]  Iron and TIBC     Status: Abnormal    Collection Time: 12/09/23 10:27 AM  Result Value Ref Range  Iron 19 (L) 45 - 182 ug/dL    TIBC 762 (L) 749 - 549 ug/dL    Saturation Ratios 8 (L) 17.9 - 39.5 %    UIBC 218 ug/dL      Comment: Performed at [facility], [address] [place], [place] [address]  Ferritin     Status: None    Collection Time: 12/09/23 10:27 AM  Result Value Ref Range    Ferritin 60 24 - 336 ng/mL      Comment: Performed at [facility], [address] [place], [place] [address]  Reticulocytes     Status: Abnormal    Collection Time: 12/09/23 10:27 AM  Result Value Ref Range    Retic Ct Pct 1.7 0.4 - 3.1 %    RBC. 3.81 (L) 4.22 - 5.81 MIL/uL    Retic Count, Absolute 64.0 19.0 - 186.0 K/uL    Immature Retic Fract 16.4 (H) 2.3 - 15.9 %      Comment: Performed at [facility], [address] [place], [place] [address]      Imaging Results (Last 48 hours)  No results found.         Blood pressure 114/66, pulse 82, temperature 98.6 F (37 C), resp. rate 18, height 5' 9 (1.753 m), weight 108.4 kg, SpO2 98%.   Medical Problem List and Plan: 1. Functional deficits secondary to thoracic myelopathy.  Status post T3 transpedicular decompression, T1-T4 PSF 12/07/2023 per Dr. Penne Sharps neurosurgery.NO BRACE REQUIRED.  As per neurosurgery postoperative drain to be removed 12/11/2022.  Wound VAC can be removed when loses suction or when the battery runs down.             -patient may not shower until vac removed             -ELOS/Goals: 10-12 days, Mod I PT/OT   - Stable for IRF admission  2.  Antithrombotics: -DVT/anticoagulation:  Pharmaceutical: Lovenox .  Check vascular study             -antiplatelet therapy: N/A   - 7/11: Admission duplex + DVT R proximal posterior tibial veins, R  mid-peroneal veins, L posterior tibial veins, L peroneal veins, and intramuscular thrombosis on R gastrocnemius veins. Rolan Haggard d/w Neurosurgery Dr. Theressa office, unable to start full dose AC until 7d post-op. Recommend continuation of PPX dose lovenox  40 mg daily with repeat Duplex Monday to ensure no extension.   -- Later, messaged by Dr. Sharps requesting additional consult for IVC filter; order placed, d/w [facility] for follow-ip in AM  3. Pain Management: Robaxin  and oxycodone  as needed 4. Mood/Behavior/Sleep: Paxil  20 mg daily             -antipsychotic agents:  5. Neuropsych/cognition: This patient is capable of making decisions on his own behalf. 6. Skin/Wound Care: Routine skin checks 7. Fluids/Electrolytes/Nutrition: Routine in and outs with follow-up chemistries 8.  Constipation.  Colace 100 mg twice daily, MiraLAX  daily as needed 9.  OSA.  CPAP 10.  Hypertension.  Currently on no antihypertensive medications or prior to admission.  Monitor with increased mobility       Toribio JINNY Pitch, PA-C 12/10/2023  I have examined the patient independently and edited the note for HPI, ROS, exam, assessment, and plan as appropriate. I am in agreement with the above recommendations.   Joesph JAYSON Likes, DO 12/10/2023

## 2023-12-10 NOTE — Progress Notes (Signed)
 Inpatient Rehab Admissions Coordinator:  There is a bed available in CIR for pt today. Dr. Caleen aware and in agreement. Pt, NSG and TOC made aware.   Tinnie Yvone Cohens, MS, CCC-SLP Admissions Coordinator [phone]

## 2023-12-10 NOTE — H&P (Incomplete)
 Physical Medicine and Rehabilitation Admission H&P    Chief Complaint  Patient presents with   Back Pain  : HPI: Corey [name] is a 58 year old right-handed male with history of hypertension on no antihypertensive medication, hyperlipidemia currently on no statin drugs, depression, sleep apnea, left total knee arthroplasty by Dr. Oneil Herald 11/11/2020 as well as past surgical microdiscectomy L5-S1.  Per chart review patient lives with spouse.  1 level home 2 steps to enter.  Independent ADLs and mobility but requiring more physical assist over the last several weeks with noted falls due to increasing weakness.  Presented 12/06/2023 progressive sensation loss in bilateral lower extremities that started in his feet progressing up to his mid abdomen.  MRI imaging revealed multilevel thoracic spondylosis with resulted diffuse spinal stenosis at T1-2 through T3-4, severe at the T2-3 level.  Secondary cord flattening/compression at T2-3 with associated cord signal changes consistent with edema/myelomalacia.  Multifactorial degenerative changes with resultant multilevel foraminal narrowing.  Notable findings include moderate bilateral foraminal narrowing at T2-3 and T3-4 severe right foraminal narrowing at T11-12.  Underwent T3 transpedicular decompression, T1-T4 PSF on 12/07/2023 per neurosurgery Dr. Penne Sharps.  Postoperative drain and wound VAC placed.  Plan is for drain to be removed 12/11/2022 and wound VAC can be removed when loses suction or when the battery runs down.NO BRACE REQUIRED.  Placed on Lovenox  or DVT prophylaxis.  Therapy evaluations completed and due to patient's decreased functional mobility was admitted for a comprehensive rehab program.  Review of Systems  Constitutional:  Negative for chills and fever.  HENT:  Negative for hearing loss.   Eyes:  Negative for blurred vision and double vision.  Respiratory:  Negative for cough, shortness of breath and wheezing.   Cardiovascular:   Negative for chest pain, palpitations and leg swelling.  Gastrointestinal:  Positive for constipation. Negative for heartburn, nausea and vomiting.  Genitourinary:  Positive for urgency. Negative for dysuria, flank pain and hematuria.  Musculoskeletal:  Positive for back pain, falls and myalgias.  Skin:  Negative for rash.  Neurological:  Positive for sensory change and weakness.  Psychiatric/Behavioral:  Positive for depression.   All other systems reviewed and are negative.  Past Medical History:  Diagnosis Date   Arthritis    knee, left   ED (erectile dysfunction)    Hyperlipidemia    Hypertension    Sleep apnea    CPAP - not using lately due to recall    Past Surgical History:  Procedure Laterality Date   ANKLE BONE SURGERY     APPLICATION OF INTRAOPERATIVE CT SCAN  12/07/2023   Procedure: APPLICATION OF INTRAOPERATIVE CT SCAN;  Surgeon: Sharps Penne ORN, MD;  Location: [facility] ORS;  Service: Neurosurgery;;   BACK SURGERY     COLLAR BONE SURGEY     COLONOSCOPY     FRACTURE SURGERY     HERNIA REPAIR     KNEE ARTHROSCOPY Left 2006   LAPAROSCOPIC APPENDECTOMY N/A 01/13/2021   Procedure: APPENDECTOMY LAPAROSCOPIC WITH LYSIS OF ADHESIONS;  Surgeon: Sheldon Standing, MD;  Location: [facility];  Service: General;  Laterality: N/A;   LUMBAR MICRODISCECTOMY     POLYPECTOMY     SKIN BIOPSY Left 08/11/2021   nodulocystic fat nercrosis   TOTAL KNEE ARTHROPLASTY Left 11/11/2020   Procedure: LEFT TOTAL KNEE ARTHROPLASTY;  Surgeon: Herald Oneil BROCKS, MD;  Location: [facility] OR;  Service: Orthopedics;  Laterality: Left;   UMBILICAL HERNIA REPAIR     Family History  Problem Relation  Age of Onset   Alzheimer's disease Mother    Stevens-Johnson syndrome Father    Autism Brother    Colon cancer Other    Colon polyps Neg Hx    Esophageal cancer Neg Hx    Rectal cancer Neg Hx    Stomach cancer Neg Hx    Social History:  reports that he has never smoked. He has never used smokeless tobacco. He reports  current alcohol use of about 3.0 standard drinks of alcohol per week. He reports that he does not currently use drugs after having used the following drugs: Marijuana. Allergies:  Allergies  Allergen Reactions   Penicillin G     Other Reaction(s): syncope   Penicillins Other (See Comments)    Makes pt black out   Medications Prior to Admission  Medication Sig Dispense Refill   acetaminophen  (TYLENOL ) 500 MG tablet Take 2 tablets (1,000 mg total) by mouth every 6 (six) hours. 30 tablet 0   [START ON 12/11/2023] cyanocobalamin  (VITAMIN B12) 1000 MCG/ML injection Inject 1 mL (1,000 mcg total) into the muscle daily for 6 days.     docusate sodium  (COLACE) 100 MG capsule Take 1 capsule (100 mg total) by mouth 2 (two) times daily.     Fe Fum-Vit C-Vit B12-FA (TRIGELS-F FORTE) CAPS capsule Take 1 capsule by mouth 2 (two) times daily.     methocarbamol  (ROBAXIN ) 500 MG tablet Take 1 tablet (500 mg total) by mouth every 6 (six) hours as needed for muscle spasms. 30 tablet 0   Multiple Vitamins-Minerals (HAIR SKIN AND NAILS FORMULA PO) Take by mouth.     Multiple Vitamins-Minerals (MULTIVITAMIN WITH MINERALS) tablet Take 2 tablets by mouth daily.     oxyCODONE  (OXY IR/ROXICODONE ) 5 MG immediate release tablet Take 1 tablet (5 mg total) by mouth every 6 (six) hours as needed for moderate pain (pain score 4-6) or severe pain (pain score 7-10). 20 tablet 0   PARoxetine  (PAXIL ) 20 MG tablet TAKE 1 TABLET BY MOUTH EVERY DAY IN THE MORNING (Patient taking differently: Take 20 mg by mouth in the morning.) 90 tablet 1   polyethylene glycol (MIRALAX  / GLYCOLAX ) 17 g packet Take 17 g by mouth daily as needed for mild constipation.     senna (SENOKOT) 8.6 MG TABS tablet Take 1 tablet (8.6 mg total) by mouth 2 (two) times daily.        Home: Home Living Family/patient expects to be discharged to:: Private residence Living Arrangements: Spouse/significant other Available Help at Discharge: Family, Available 24  hours/day Type of Home: House Home Access: Stairs to enter [employer] of Steps: 2 Entrance Stairs-Rails: None Home Layout: One level Bathroom Shower/Tub: Health visitor: Standard Bathroom Accessibility: Yes Home Equipment: Agricultural consultant (2 wheels), [employer] - single point, Rollator (4 wheels), BSC/3in1, Shower seat Additional Comments: mutiple daily falls due to progressive leg weakness,  10+ since beginning of June   Functional History: Prior Function Prior Level of Function : Working/employed, Driving, Independent/Modified Independent Mobility Comments: prior to onset of weakness, independent, working full time. in the last week started using rollator ADLs Comments: prior to beginning of June, pt completely independent. pt now requires assist for most BADLs (showers, LB dressing) due to BLE weakness  Functional Status:  Mobility: Bed Mobility Overal bed mobility: Needs Assistance Bed Mobility: Rolling, Sidelying to Sit Rolling: Min assist, Used rails Sidelying to sit: Mod assist, Used rails General bed mobility comments: NT, pt recieved and left in recliner Transfers Overall transfer  level: Needs assistance Equipment used: Rolling walker (2 wheels) Transfers: Sit to/from Stand Sit to Stand: Min assist, +2 physical assistance General transfer comment: cues for technique, steadying assist Ambulation/Gait Ambulation/Gait assistance: Contact guard assist Gait Distance (Feet):  (2 bouts of ~25ft) Assistive device: Rolling walker (2 wheels) General Gait Details: unable to ambulate due to light headedness Gait velocity: decreased    ADL: ADL Overall ADL's : Needs assistance/impaired Lower Body Dressing: Sit to/from stand, Moderate assistance, Sitting/lateral leans, Cueing for sequencing, Cueing for safety, With adaptive equipment Lower Body Dressing Details (indicate cue type and reason): educated pt on use of AE for LB dressing with pt and spouse  verbalizing understanding Toilet Transfer: Minimal assistance, Rolling walker (2 wheels), BSC/3in1 Toileting- Clothing Manipulation and Hygiene: Maximal assistance, Sit to/from stand, Cueing for sequencing, Cueing for safety, Adhering to back precautions Functional mobility during ADLs: Minimal assistance, Rolling walker (2 wheels), Cueing for sequencing, Cueing for safety General ADL Comments: pt significantly far from baseline, anticipate up to MAX A for LB ADLs  Cognition: Cognition Orientation Level: Oriented X4 Cognition Arousal: Alert Behavior During Therapy: WFL for tasks assessed/performed  Physical Exam: Blood pressure 114/66, pulse 82, temperature 98.6 F (37 C), resp. rate 18, height 5' 9 (1.753 m), weight 108.4 kg, SpO2 98%. Physical Exam Skin:    Comments: Back incision with wound drain as well as VAC in place.  Neurological:     Comments: Patient is alert and oriented.  Following commands.     Results for orders placed or performed during the hospital encounter of 12/06/23 (from the past 48 hours)  Hemoglobin     Status: Abnormal   Collection Time: 12/09/23  2:45 AM  Result Value Ref Range   Hemoglobin 10.2 (L) 13.0 - 17.0 g/dL    Comment: Performed at [facility], [address] [place], [place] [address]  Vitamin B12     Status: Abnormal   Collection Time: 12/09/23 10:27 AM  Result Value Ref Range   Vitamin B-12 131 (L) 180 - 914 pg/mL    Comment: (NOTE) This assay is not validated for testing neonatal or myeloproliferative syndrome specimens for Vitamin B12 levels. Performed at [facility], [address] [place], [place] [address]   Folate     Status: Abnormal   Collection Time: 12/09/23 10:27 AM  Result Value Ref Range   Folate 5.3 (L) >5.9 ng/mL    Comment: Performed at [facility], [address] [place], [place] [address]  Iron and TIBC     Status: Abnormal   Collection Time: 12/09/23 10:27 AM  Result Value Ref  Range   Iron 19 (L) 45 - 182 ug/dL   TIBC 762 (L) 749 - 549 ug/dL   Saturation Ratios 8 (L) 17.9 - 39.5 %   UIBC 218 ug/dL    Comment: Performed at [facility], [address] [place], [place] [address]  Ferritin     Status: None   Collection Time: 12/09/23 10:27 AM  Result Value Ref Range   Ferritin 60 24 - 336 ng/mL    Comment: Performed at [facility], [address] [place], [place] [address]  Reticulocytes     Status: Abnormal   Collection Time: 12/09/23 10:27 AM  Result Value Ref Range   Retic Ct Pct 1.7 0.4 - 3.1 %   RBC. 3.81 (L) 4.22 - 5.81 MIL/uL   Retic Count, Absolute 64.0 19.0 - 186.0 K/uL   Immature Retic Fract 16.4 (  H) 2.3 - 15.9 %    Comment: Performed at [facility], [address] [place], [place] [address]   No results found.    Blood pressure 114/66, pulse 82, temperature 98.6 F (37 C), resp. rate 18, height 5' 9 (1.753 m), weight 108.4 kg, SpO2 98%.  Medical Problem List and Plan: 1. Functional deficits secondary to thoracic myelopathy.  Status post T3 transpedicular decompression, T1-T4 PSF 12/07/2023 per Dr. Penne Sharps neurosurgery.NO BRACE REQUIRED.  As per neurosurgery postoperative drain to be removed 12/11/2022.  Wound VAC can be removed when loses suction or when the battery runs down.  -patient may *** shower  -ELOS/Goals: *** 2.  Antithrombotics: -DVT/anticoagulation:  Pharmaceutical: Lovenox .  Check vascular study  -antiplatelet therapy: N/A 3. Pain Management: Robaxin  and oxycodone  as needed 4. Mood/Behavior/Sleep: Paxil  20 mg daily  -antipsychotic agents:  5. Neuropsych/cognition: This patient is capable of making decisions on his own behalf. 6. Skin/Wound Care: Routine skin checks 7. Fluids/Electrolytes/Nutrition: Routine in and outs with follow-up chemistries 8.  Constipation.  Colace 100 mg twice daily, MiraLAX  daily as needed 9.  OSA.  CPAP 10.  Hypertension.  Currently on no antihypertensive  medications or prior to admission.  Monitor with increased mobility    Toribio JINNY Pitch, PA-C 12/10/2023

## 2023-12-10 NOTE — Op Note (Addendum)
 Indications: [name] is a 58 y.o. male with severe thoracic myelopathy with progressive ambulatory functional loss lower extremity weakness, pathologic reflexes found to have severe thoracic compression with T2 signal change  Findings:  Lateral anterior calcified osteophytes causing severe stenosis.  Well decompressed at the end of the procedure  Preoperative Diagnosis:  Thoracic spinal cord compression with T2 signal change and progressive myelopathy  Postoperative Diagnosis: same   EBL: 400 IVF: see anesthesia record Drains: Subfascial JP drain  Disposition: Extubated and Stable to PACU Complications: none  No foley catheter was placed.   Preoperative Note:   Risks of surgery discussed include: infection, bleeding, stroke, coma, death, paralysis, CSF leak, nerve/spinal cord injury, numbness, tingling, weakness, complex regional pain syndrome, recurrent stenosis and/or disc herniation, vascular injury, development of instability, neck/back pain, need for further surgery, persistent symptoms, development of deformity, and the risks of anesthesia. The patient understood these risks and agreed to proceed.  Operative Note:   OPERATIVE PROCEDURE:  1. Posterior Segmental Instrumentation T 1 to T 4 2. Posterolateral arthrodesis from T 1 to T 4 3. Thoracic Laminotomy at T 1 and T 4 4.  Transpedicular decompression at T3 on the right 5. Use of Allograft 6. Use of intraoperative navigation   OPERATIVE PROCEDURE:  After induction of general anesthesia, the patient was placed in the prone position on the operative table.  A midline incision was then planned using fluoroscopy.  A timeout was performed, and antibiotics given.  Next, a midline incision was planned.  The patient was prepped and draped in the usual fashion.  All the pressure points were padded.  After a comprehensive timeout verifying the patient's name, MRN, planned procedure, and images local anesthetic was injected  into the midline incision.  After giving this time to side and a midline incision was made.  It was opened sharply.  Was brought down to the level of the thoracic fascia.  Then a subperiosteal dissection was used from T1 to T4in order to expose the posterior spinal elements.  At each level we identified the spinous process brought the dissection down to the level of the lamina and expose the medial aspect of the transverse process.  At the most cranial level we took care not to violate the facet capsule.  After satisfactory exposure has been obtained our attention turned towards instrumentation.  At this point we fixated the navigation reference frame to the most inferior spinous process.  We fixed this into place and ensure that it was in good standing.  We then covered the patient in a sterile fashion and prepared for the three-dimensional C arm image acquisition.  We performed an image acquisition and afterwards checked the reference.  The reference was well registered based off of multiple points and therefore we chose not to obtain a new image.  Once imaging was adequate we then turned our attention towards placing pedicle screws.  First using a navigated drill guide we planned the for screw at the most caudal level.  On the right side we started at T T1, we found a ideal starting point with a good trajectory down the pedicle.  Based off of the intraoperative imaging we sized the pedicle screw appropriately.  We then utilized the navigated drill guide to place a pilot hole.  After the pilot hole was placed we then palpated this with a hole probe.  We repeated this on the contralateral side and then descended down to the level of T4 bilaterally sequentially instrumenting  each pedicle.  The right sided T3 pedicle was predrilled but a screw was not placed at this level due to the plan for the transpedicular decompression  We then performed a midline laminectomy from just superior to the disc space of T1 to  down to the level of the T3-4 disc space.  We then utilized intraoperative ultrasound to the plantar T3 transpedicular decompression.  We removed the pedicle down to the level of the vertebral body.  We are able to palpate ventral and felt calcified osteophytes laterally.  This was verified with our intraoperative ultrasound.  Coming down the posterior lateral aspect and the transpedicular fashion we are able to palpate the osteophyte and remove some disc material.  We felt contralaterally after making some room in the disc space, there was no large central disc most of the compression was coming from out laterally.  The ultrasound demonstrated good pulsatile flow both ventral and posterior to the spinal cord.  It appeared to be well decompressed throughout the area of exposure.  Once the decompression was satisfactory we then turned our attention to preparing the posterior lateral endplates for arthrodesis.  We had previously harvested the spinous processes for autograft.  We mixed this with allograft.  The allograft was placed on the posterior lateral elements after meticulous hemostasis and copious irrigation.  Once this was in place we then affixed appropriately sized rods.  The rods were secured into place with set screws.  These were final tightened.  Imaging demonstrated good positioning of the hardware.  I was at that point that we decided to move forward with closure.  We placed a subfascial JP drain.  We closed in multiple layers.  Staples were used on the superficial aspect.  A wound VAC drain was placed  The counts were correct, no immediate complications.  I performed this procedure with the assistance of Edsel Goods, PA-C.  She was necessary for the procedure given its complexity, she helped with exposure, retraction, suctioning, protection of the neural elements.  I performed the critical aspects of the procedure myself.   IONM was stable throughout the procedure  Penne MICAEL Sharps, MD     Implant Name Type Inv. Item Serial No. Manufacturer Lot No. LRB No. Used Action  ALLOGRAFT BONESTRIP KORE 2.5X5 - [id number] Bone Implant ALLOGRAFT BONESTRIP KORE 2.5X5 [id number] [employer] TRANSPLANT FNDN   1 Implanted  SCREW RELINE 5.0X30MM POLY - [id number] Screw SCREW RELINE 5.0X30MM POLY  [employer]   7 Implanted  SCREW LOCK RELINE 5.5 [place] - [id number] Screw SCREW LOCK RELINE 5.5 TULIP  [employer]   7 Implanted  ROD RELINE 5.5X90MM LORDOTIC - [id number] Rod ROD RELINE 5.5X90MM LORDOTIC  [employer]   2 Implanted

## 2023-12-10 NOTE — Progress Notes (Signed)
 VASCULAR LAB    Bilateral lower extremity venous duplex has been performed.  See CV proc for preliminary results.  Gave verbal report to Dr. Emeline LIS, [facility], RVT 12/10/2023, 2:47 PM

## 2023-12-11 DIAGNOSIS — I82403 Acute embolism and thrombosis of unspecified deep veins of lower extremity, bilateral: Secondary | ICD-10-CM | POA: Diagnosis not present

## 2023-12-11 DIAGNOSIS — G4733 Obstructive sleep apnea (adult) (pediatric): Secondary | ICD-10-CM | POA: Diagnosis not present

## 2023-12-11 DIAGNOSIS — M4714 Other spondylosis with myelopathy, thoracic region: Secondary | ICD-10-CM | POA: Diagnosis not present

## 2023-12-11 MED ORDER — SORBITOL 70 % SOLN
45.0000 mL | Freq: Once | Status: AC
  Administered 2023-12-11: 45 mL via ORAL
  Filled 2023-12-11: qty 60

## 2023-12-11 NOTE — Evaluation (Signed)
 Physical Therapy Assessment and Plan  Patient Details  Name: Corey [name] MRN: [id number] Date of Birth: 21-Oct-1965  PT Diagnosis: Abnormality of gait, Difficulty walking, Impaired sensation, Muscle weakness, and Pain in back Rehab Potential: Excellent ELOS: 7-9 days   Today's Date: 12/11/2023 PT Individual Time: 1000-1113 PT Individual Time Calculation (min): 73 min    Hospital Problem: Principal Problem:   Thoracic myelopathy   Past Medical History:  Past Medical History:  Diagnosis Date   Arthritis    knee, left   ED (erectile dysfunction)    Hyperlipidemia    Hypertension    Sleep apnea    CPAP - not using lately due to recall    Past Surgical History:  Past Surgical History:  Procedure Laterality Date   ANKLE BONE SURGERY     APPLICATION OF INTRAOPERATIVE CT SCAN  12/07/2023   Procedure: APPLICATION OF INTRAOPERATIVE CT SCAN;  Surgeon: Claudene Penne ORN, MD;  Location: [facility] ORS;  Service: Neurosurgery;;   BACK SURGERY     COLLAR BONE SURGEY     COLONOSCOPY     FRACTURE SURGERY     HERNIA REPAIR     KNEE ARTHROSCOPY Left 2006   LAPAROSCOPIC APPENDECTOMY N/A 01/13/2021   Procedure: APPENDECTOMY LAPAROSCOPIC WITH LYSIS OF ADHESIONS;  Surgeon: Sheldon Standing, MD;  Location: [facility];  Service: General;  Laterality: N/A;   LUMBAR MICRODISCECTOMY     POLYPECTOMY     SKIN BIOPSY Left 08/11/2021   nodulocystic fat nercrosis   TOTAL KNEE ARTHROPLASTY Left 11/11/2020   Procedure: LEFT TOTAL KNEE ARTHROPLASTY;  Surgeon: Barbarann Oneil BROCKS, MD;  Location: [facility];  Service: Orthopedics;  Laterality: Left;   UMBILICAL HERNIA REPAIR      Assessment & Plan Clinical Impression: Patient is a 58 year old right-handed male with history of hypertension on no antihypertensive medication, hyperlipidemia currently on no statin drugs, depression, sleep apnea, left total knee arthroplasty by Dr. Oneil Barbarann 11/11/2020 as well as past surgical microdiscectomy L5-S1. Per chart review patient lives  with spouse. 1 level home 2 steps to enter. Independent ADLs and mobility but requiring more physical assist over the last several weeks with noted falls due to increasing weakness. Presented 12/06/2023 progressive sensation loss in bilateral lower extremities that started in his feet progressing up to his mid abdomen. MRI imaging revealed multilevel thoracic spondylosis with resulted diffuse spinal stenosis at T1-2 through T3-4, severe at the T2-3 level. Secondary cord flattening/compression at T2-3 with associated cord signal changes consistent with edema/myelomalacia. Multifactorial degenerative changes with resultant multilevel foraminal narrowing. Notable findings include moderate bilateral foraminal narrowing at T2-3 and T3-4 severe right foraminal narrowing at T11-12. Underwent T3 transpedicular decompression, T1-T4 PSF on 12/07/2023 per neurosurgery Dr. Penne Claudene. Postoperative drain and wound VAC placed. Plan is for drain to be removed 12/11/2022 and wound VAC can be removed when loses suction or when the battery runs down.NO BRACE REQUIRED. Placed on Lovenox  or DVT prophylaxis. Therapy evaluations completed and due to patient's decreased functional mobility was admitted for a comprehensive rehab program. Patient transferred to CIR on 12/10/2023 .   Patient currently requires min with mobility secondary to muscle weakness and muscle joint tightness, decreased cardiorespiratoy endurance, and decreased standing balance and decreased balance strategies.  Prior to hospitalization, patient was independent  with mobility and lived with Spouse in a House home.  Home access is 2Stairs to enter.  Patient will benefit from skilled PT intervention to maximize safe functional mobility, minimize fall risk, and decrease caregiver burden  for planned discharge home with intermittent assist.  Anticipate patient will benefit from follow up OP at discharge.  PT - End of Session Activity Tolerance: Tolerates 10 - 20 min  activity with multiple rests Endurance Deficit: Yes PT Assessment Rehab Potential (ACUTE/IP ONLY): Excellent PT Barriers to Discharge: Decreased caregiver support;Home environment access/layout PT Patient demonstrates impairments in the following area(s): Balance;Endurance;Pain;Safety;Sensory PT Transfers Functional Problem(s): Bed Mobility;Bed to Chair;Car PT Locomotion Functional Problem(s): Ambulation;Stairs;Wheelchair Mobility PT Plan PT Intensity: Minimum of 1-2 x/day ,45 to 90 minutes PT Frequency: 5 out of 7 days PT Duration Estimated Length of Stay: 7-9 days PT Treatment/Interventions: Ambulation/gait training;Balance/vestibular training;Community reintegration;Discharge planning;Disease management/prevention;DME/adaptive equipment instruction;Functional electrical stimulation;Functional mobility training;Neuromuscular re-education;Pain management;Patient/family education;Psychosocial support;Splinting/orthotics;Therapeutic Activities;Stair training;Therapeutic Exercise;UE/LE Strength taining/ROM;UE/LE Coordination activities;Wheelchair propulsion/positioning PT Transfers Anticipated Outcome(s): mod I PT Locomotion Anticipated Outcome(s): mod I PT Recommendation Recommendations for Other Services: None Follow Up Recommendations: Outpatient PT Patient destination: Home Equipment Recommended: To be determined   PT Evaluation Precautions/Restrictions Precautions Precautions: Fall;Back Precaution Booklet Issued: Yes (comment) Recall of Precautions/Restrictions: Intact Precaution/Restrictions Comments: spinal precautions, no brace needed per orders Restrictions Weight Bearing Restrictions Per Provider Order: No  Pain Interference Pain Interference Pain Effect on Sleep: 4. Almost constantly Pain Interference with Therapy Activities: 3. Frequently Pain Interference with Day-to-Day Activities: 3. Frequently Home Living/Prior Functioning Home Living Available Help at Discharge:  Family;Available 24 hours/day Type of Home: House Home Access: Stairs to enter [employer] of Steps: 2 Entrance Stairs-Rails: None Home Layout: One level Bathroom Shower/Tub: Walk-in shower;Door Bathroom Toilet: Standard Bathroom Accessibility: Yes Additional Comments: no grab bars, purchasing shower chair.  Lives With: Spouse Prior Function Level of Independence: Independent with basic ADLs;Independent with homemaking with ambulation;Independent with gait;Independent with transfers  Able to Take Stairs?: Yes Driving: Yes Vocation: Full time employment Vocation Requirements: Warehouse for [employer] Leisure: Hobbies-yes (Comment) (working out- exercise) Vision/Perception  Vision - History Ability to See in Adequate Light: 0 Adequate Perception Perception: Within Functional Limits Praxis Praxis: WFL  Cognition Overall Cognitive Status: Within Functional Limits for tasks assessed Arousal/Alertness: Awake/alert Orientation Level: Oriented X4 Memory: Appears intact Awareness: Appears intact Problem Solving: Appears intact Safety/Judgment: Appears intact Sensation Sensation Light Touch: Appears Intact Hot/Cold: Appears Intact Proprioception: Appears Intact Stereognosis: Appears Intact Additional Comments: Reports all x5 toes in both feet have been cold and slightly numb; prior to surgery when symptoms began. Coordination Gross Motor Movements are Fluid and Coordinated: No Fine Motor Movements are Fluid and Coordinated: Yes Coordination and Movement Description: pain limiting; mild spasms in LLE Finger Nose Finger Test: [facility] B/L Motor  Motor Motor: Within Functional Limits   Trunk/Postural Assessment  Cervical Assessment Cervical Assessment: Within Functional Limits Thoracic Assessment Thoracic Assessment: Exceptions to [facility] (pain limiting from surgery. Deferred formal testing for rotationand mobility) Lumbar Assessment Lumbar Assessment: Within Functional  Limits Postural Control Postural Control: Within Functional Limits  Balance Balance Balance Assessed: Yes Standardized Balance Assessment Standardized Balance Assessment: Timed Up and Go Test Timed Up and Go Test TUG: Normal TUG Normal TUG (seconds): 55 (w/ RW) Dynamic Sitting Balance Dynamic Sitting - Balance Support: No upper extremity supported Dynamic Sitting - Level of Assistance: 5: Stand by assistance Dynamic Sitting - Balance Activities: Forward lean/weight shifting;Lateral lean/weight shifting;Reaching for objects Static Standing Balance Static Standing - Balance Support: Bilateral upper extremity supported Static Standing - Level of Assistance: 5: Stand by assistance Static Standing - Comment/# of Minutes: 2 Dynamic Standing Balance Dynamic Standing - Balance Support: During functional activity;Bilateral upper extremity supported Dynamic Standing -  Level of Assistance: 4: Min assist Dynamic Standing - Balance Activities: Reaching for objects;Forward lean/weight shifting Extremity Assessment  RUE Assessment RUE Assessment: Within Functional Limits Active Range of Motion (AROM) Comments: WFL General Strength Comments: 5/5 grossly LUE Assessment LUE Assessment: Within Functional Limits Active Range of Motion (AROM) Comments: WFL General Strength Comments: 5/5 grossly RLE Assessment RLE Assessment: Within Functional Limits LLE Assessment LLE Assessment: Exceptions to [facility] LLE Strength LLE Overall Strength: Deficits Left Hip Flexion: 2+/5 Left Hip ABduction: 4/5 Left Hip ADduction: 4-/5 Left Knee Flexion: 4-/5 Left Knee Extension: 4-/5 Left Ankle Dorsiflexion: 4/5  Care Tool Care Tool Bed Mobility Roll left and right activity   Roll left and right assist level: Supervision/Verbal cueing    Sit to lying activity   Sit to lying assist level: Contact Guard/Touching assist    Lying to sitting on side of bed activity   Lying to sitting on side of bed assist level:  the ability to move from lying on the back to sitting on the side of the bed with no back support.: Contact Guard/Touching assist     Care Tool Transfers Sit to stand transfer   Sit to stand assist level: Minimal Assistance - Patient > 75%    Chair/bed transfer   Chair/bed transfer assist level: Minimal Assistance - Patient > 75%    Car transfer   Car transfer assist level: Minimal Assistance - Patient > 75%      Care Tool Locomotion Ambulation   Assist level: Minimal Assistance - Patient > 75% Assistive device: Walker-rolling Max distance: 142ft  Walk 10 feet activity   Assist level: Minimal Assistance - Patient > 75% Assistive device: Walker-rolling   Walk 50 feet with 2 turns activity   Assist level: Minimal Assistance - Patient > 75% Assistive device: Walker-rolling  Walk 150 feet activity   Assist level: Minimal Assistance - Patient > 75% Assistive device: Walker-rolling  Walk 10 feet on uneven surfaces activity Walk 10 feet on uneven surfaces activity did not occur: Safety/medical concerns      Stairs   Assist level: Minimal Assistance - Patient > 75% Stairs assistive device: 2 hand rails Max number of stairs: 5  Walk up/down 1 step activity   Walk up/down 1 step (curb) assist level: Minimal Assistance - Patient > 75% Walk up/down 1 step or curb assistive device: 2 hand rails  Walk up/down 4 steps activity   Walk up/down 4 steps assist level: Minimal Assistance - Patient > 75% Walk up/down 4 steps assistive device: 2 hand rails  Walk up/down 12 steps activity Walk up/down 12 steps activity did not occur: Safety/medical concerns      Pick up small objects from floor Pick up small object from the floor (from standing position) activity did not occur: Safety/medical concerns      Wheelchair Is the patient using a wheelchair?: Yes Type of Wheelchair: Manual   Wheelchair assist level: Supervision/Verbal cueing Max wheelchair distance: 166ft  Wheel 50 feet with 2  turns activity   Assist Level: Supervision/Verbal cueing  Wheel 150 feet activity   Assist Level: Supervision/Verbal cueing    Refer to Care Plan for Long Term Goals  SHORT TERM GOAL WEEK 1 PT Short Term Goal 1 (Week 1): STG = LTG due to ELOS  Recommendations for other services: None   Skilled Therapeutic Intervention Mobility Bed Mobility Bed Mobility: Rolling Right;Rolling Left;Right Sidelying to Sit;Left Sidelying to Sit;Supine to Sit;Sitting - Scoot to Edge of Bed;Sit to Supine;Sit to Sidelying  Left Rolling Right: Supervision/verbal cueing Rolling Left: Supervision/Verbal cueing Right Sidelying to Sit: Supervision/Verbal cueing Left Sidelying to Sit: Supervision/Verbal cueing Supine to Sit: Contact Guard/Touching assist Sitting - Scoot to Edge of Bed: Supervision/Verbal cueing Sit to Supine: Contact Guard/Touching assist Sit to Sidelying Left: Contact Guard/Touching assist Transfers Transfers: Sit to Stand;Stand to Sit;Stand Pivot Transfers Sit to Stand: Minimal Assistance - Patient > 75% Stand to Sit: Minimal Assistance - Patient > 75% Stand Pivot Transfers: Minimal Assistance - Patient > 75% Stand Pivot Transfer Details: Tactile cues for sequencing;Tactile cues for posture;Verbal cues for precautions/safety;Verbal cues for safe use of DME/AE;Verbal cues for technique Transfer (Assistive device): Rolling walker Locomotion  Gait Ambulation: Yes Gait Assistance: Minimal Assistance - Patient > 75% Gait Distance (Feet): 175 Feet Assistive device: Rolling walker Gait Assistance Details: Verbal cues for gait pattern;Verbal cues for precautions/safety;Verbal cues for safe use of DME/AE;Verbal cues for technique Gait Gait: Yes Gait Pattern: Impaired Gait Pattern: Step-through pattern;Decreased stance time - left;Decreased stride length;Antalgic;Trunk flexed;Left flexed knee in stance;Decreased weight shift to left Stairs / Additional Locomotion Stairs: Yes Stairs Assistance:  Minimal Assistance - Patient > 75% Stair Management Technique: Two rails;Alternating pattern Number of Stairs: 4 Height of Stairs: 6 Wheelchair Mobility Wheelchair Mobility: Yes Wheelchair Assistance: Doctor, general practice: Both upper extremities Wheelchair Parts Management: Needs assistance Distance: 189ft  Skilled Treatment: Pt sitting up in recliner to start evaluation. Patient pleasant and cooperative, A&Ox4. Reports 1/10 resting pain which evolves to 5/10 during assessment. Pt denies need for pain medication and mobility and distraction provided for pain management. Pt confirms PLOF and social factors - reports since June 1st has been having several falls (>10) at home due to BLE weakness. 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 Patient completed functional mobility as outlined above. Grossly, requires minA with the use of a RW for functional mobility. He presents with BLE weakness (LLE > RLE), decreased activity tolerance, impaired standing balance, and pain limiting mobility. Anticipate if pain stays controlled, he will progress quickly given his age and determination. Patient ended session sitting in recliner with his needs met, call bell within reach.  Instructed pt in results of PT evaluation as detailed above, PT POC, rehab potential, rehab goals, and discharge recommendations. Additionally discussed CIR's policies regarding fall safety and use of chair alarm and/or quick release belt. Pt verbalized understanding and in agreement. Will update pt's family members as they become available.   Discharge Criteria: Patient will be discharged from PT if patient refuses treatment 3 consecutive times without medical reason, if treatment goals not met, if there is a change in medical status, if patient makes no progress towards goals or if patient is discharged from hospital.  The above assessment, treatment plan, treatment alternatives  and goals were discussed and mutually agreed upon: by patient  Sherlean SHAUNNA Perks  PT, DPT, CSRS  12/11/2023, 11:01 AM

## 2023-12-11 NOTE — Plan of Care (Signed)
  Problem: RH Balance Goal: LTG Patient will maintain dynamic standing balance (PT) Description: LTG:  Patient will maintain dynamic standing balance with assistance during mobility activities (PT) Flowsheets (Taken 12/11/2023 1143) LTG: Pt will maintain dynamic standing balance during mobility activities with:: Independent with assistive device    Problem: Sit to Stand Goal: LTG:  Patient will perform sit to stand with assistance level (PT) Description: LTG:  Patient will perform sit to stand with assistance level (PT) Flowsheets (Taken 12/11/2023 1143) LTG: PT will perform sit to stand in preparation for functional mobility with assistance level: Independent with assistive device   Problem: RH Bed Mobility Goal: LTG Patient will perform bed mobility with assist (PT) Description: LTG: Patient will perform bed mobility with assistance, with/without cues (PT). Flowsheets (Taken 12/11/2023 1143) LTG: Pt will perform bed mobility with assistance level of: Independent with assistive device    Problem: RH Bed to Chair Transfers Goal: LTG Patient will perform bed/chair transfers w/assist (PT) Description: LTG: Patient will perform bed to chair transfers with assistance (PT). Flowsheets (Taken 12/11/2023 1143) LTG: Pt will perform Bed to Chair Transfers with assistance level: Independent with assistive device    Problem: RH Car Transfers Goal: LTG Patient will perform car transfers with assist (PT) Description: LTG: Patient will perform car transfers with assistance (PT). Flowsheets (Taken 12/11/2023 1143) LTG: Pt will perform car transfers with assist:: Supervision/Verbal cueing   Problem: RH Ambulation Goal: LTG Patient will ambulate in controlled environment (PT) Description: LTG: Patient will ambulate in a controlled environment, # of feet with assistance (PT). Flowsheets (Taken 12/11/2023 1143) LTG: Pt will ambulate in controlled environ  assist needed:: Independent with assistive  device LTG: Ambulation distance in controlled environment: 168ft with LRAD Goal: LTG Patient will ambulate in home environment (PT) Description: LTG: Patient will ambulate in home environment, # of feet with assistance (PT). Flowsheets (Taken 12/11/2023 1143) LTG: Pt will ambulate in home environ  assist needed:: Independent with assistive device LTG: Ambulation distance in home environment: 75ft with LRAD   Problem: RH Stairs Goal: LTG Patient will ambulate up and down stairs w/assist (PT) Description: LTG: Patient will ambulate up and down # of stairs with assistance (PT) Flowsheets (Taken 12/11/2023 1143) LTG: Pt will ambulate up/down stairs assist needed:: Supervision/Verbal cueing LTG: Pt will  ambulate up and down number of stairs: at least 2 steps with no railings as per home setup

## 2023-12-11 NOTE — Evaluation (Addendum)
 Occupational Therapy Assessment and Plan  Patient Details  Name: Corey [name] MRN: [id number] Date of Birth: 1965/06/27  OT Diagnosis: acute pain, muscle weakness (generalized), and mid back pain Rehab Potential: Rehab Potential (ACUTE ONLY): Good ELOS: 10-12 days   Today's Date: 12/11/2023 OT Individual Time: 0733-0900 OT Individual Time Calculation (min): 87 min     Hospital Problem: Principal Problem:   Thoracic myelopathy   Past Medical History:  Past Medical History:  Diagnosis Date   Arthritis    knee, left   ED (erectile dysfunction)    Hyperlipidemia    Hypertension    Sleep apnea    CPAP - not using lately due to recall    Past Surgical History:  Past Surgical History:  Procedure Laterality Date   ANKLE BONE SURGERY     APPLICATION OF INTRAOPERATIVE CT SCAN  12/07/2023   Procedure: APPLICATION OF INTRAOPERATIVE CT SCAN;  Surgeon: Claudene Penne ORN, MD;  Location: [facility];  Service: Neurosurgery;;   BACK SURGERY     COLLAR BONE SURGEY     COLONOSCOPY     FRACTURE SURGERY     HERNIA REPAIR     KNEE ARTHROSCOPY Left 2006   LAPAROSCOPIC APPENDECTOMY N/A 01/13/2021   Procedure: APPENDECTOMY LAPAROSCOPIC WITH LYSIS OF ADHESIONS;  Surgeon: Sheldon Standing, MD;  Location: [facility];  Service: General;  Laterality: N/A;   LUMBAR MICRODISCECTOMY     POLYPECTOMY     SKIN BIOPSY Left 08/11/2021   nodulocystic fat nercrosis   TOTAL KNEE ARTHROPLASTY Left 11/11/2020   Procedure: LEFT TOTAL KNEE ARTHROPLASTY;  Surgeon: Barbarann Oneil BROCKS, MD;  Location: [facility];  Service: Orthopedics;  Laterality: Left;   UMBILICAL HERNIA REPAIR      Assessment & Plan Clinical Impression: Patient is a 58 y.o. right-handed male with history of hypertension on no antihypertensive medication, hyperlipidemia currently on no statin drugs, depression, sleep apnea, left total knee arthroplasty by Dr. Oneil Barbarann 11/11/2020 as well as past surgical microdiscectomy L5-S1. Per chart review patient lives with  spouse. 1 level home 2 steps to enter. Independent ADLs and mobility but requiring more physical assist over the last several weeks with noted falls due to increasing weakness. Presented 12/06/2023 progressive sensation loss in bilateral lower extremities that started in his feet progressing up to his mid abdomen. MRI imaging revealed multilevel thoracic spondylosis with resulted diffuse spinal stenosis at T1-2 through T3-4, severe at the T2-3 level. Secondary cord flattening/compression at T2-3 with associated cord signal changes consistent with edema/myelomalacia. Multifactorial degenerative changes with resultant multilevel foraminal narrowing. Notable findings include moderate bilateral foraminal narrowing at T2-3 and T3-4 severe right foraminal narrowing at T11-12. Underwent T3 transpedicular decompression, T1-T4 PSF on 12/07/2023 per neurosurgery Dr. Penne Claudene. Postoperative drain and wound VAC placed. Plan is for drain to be removed 12/11/2022 and wound VAC can be removed when loses suction or when the battery runs down.NO BRACE REQUIRED. Placed on Lovenox  or DVT prophylaxis. Therapy evaluations completed and due to patient's decreased functional mobility was admitted for a comprehensive rehab program.   Patient currently requires mod with basic self-care skills secondary to muscle weakness and muscle joint tightness, decreased cardiorespiratoy endurance, and decreased standing balance, decreased postural control, and decreased balance strategies.  Prior to hospitalization, patient could complete adls, IADLs, working with independent .  Patient will benefit from skilled intervention to decrease level of assist with basic self-care skills, increase independence with basic self-care skills, and increase level of independence with iADL prior to discharge  home with care partner.  Anticipate patient will require intermittent supervision for high level IADLS and no further OT follow recommended.  OT - End of  Session Activity Tolerance: Tolerates 30+ min activity with multiple rests Endurance Deficit: Yes OT Assessment Rehab Potential (ACUTE ONLY): Good OT Patient demonstrates impairments in the following area(s): Balance;Pain;Safety;Motor;Endurance OT Basic ADL's Functional Problem(s): Grooming;Bathing;Dressing;Toileting OT Transfers Functional Problem(s): Tub/Shower;Toilet OT Plan OT Intensity: Minimum of 1-2 x/day, 45 to 90 minutes OT Frequency: 5 out of 7 days OT Duration/Estimated Length of Stay: 10-12 days OT Treatment/Interventions: Balance/vestibular training;Discharge planning;Pain management;Self Care/advanced ADL retraining;Therapeutic Activities;UE/LE Coordination activities;Cognitive remediation/compensation;Disease mangement/prevention;Functional mobility training;Patient/family education;Therapeutic Exercise;Community reintegration;DME/adaptive equipment instruction;Psychosocial support;UE/LE Strength taining/ROM OT Self Feeding Anticipated Outcome(s): IND OT Basic Self-Care Anticipated Outcome(s): mod I OT Toileting Anticipated Outcome(s): mod I OT Bathroom Transfers Anticipated Outcome(s): mod I OT Recommendation Patient destination: Home Follow Up Recommendations: None Equipment Recommended: Other (comment) Equipment Details: has rollator, walker, tub bench   OT Evaluation Precautions/Restrictions  Precautions Precautions: Fall;Back Precaution Booklet Issued: Yes (comment) Recall of Precautions/Restrictions: Intact Precaution/Restrictions Comments: spinal precautions, no brace needed per orders Restrictions Weight Bearing Restrictions Per Provider Order: No General Chart Reviewed: Yes PT Missed Treatment Reason: Not applicable Response to Previous Treatment: Not applicable Vital Signs   Pain Pain Assessment Pain Scale: 0-10 Pain Score: 6  Pain Type: Acute pain;Surgical pain Pain Location: Back Pain Orientation: Upper;Posterior;Mid Pain Intervention(s):  Medication (See eMAR) Home Living/Prior Functioning Home Living Family/patient expects to be discharged to:: Private residence Living Arrangements: Spouse/significant other Available Help at Discharge: Family, Available 24 hours/day Type of Home: House Home Access: Stairs to enter Secretary/administrator of Steps: 2 Entrance Stairs-Rails: None Home Layout: One level Bathroom Shower/Tub: Health visitor: Standard Bathroom Accessibility: Yes Additional Comments: no grab bars, purchasing shower chair.  Lives With: Spouse IADL History Homemaking Responsibilities: Yes Meal Prep Responsibility: Secondary Laundry Responsibility: Secondary Cleaning Responsibility: Secondary Bill Paying/Finance Responsibility: Secondary Shopping Responsibility: Secondary Current License: Yes Occupation: Full time employment Type of Occupation: Naval architect- [employer] Prior Function Level of Independence: Independent with basic ADLs, Independent with homemaking with ambulation, Independent with gait, Independent with transfers  Able to Take Stairs?: Yes Driving: Yes Vocation: Full time employment Leisure: Hobbies-yes (Comment) (working out- exercise) Vision Baseline Vision/History: 1 Wears glasses Ability to See in Adequate Light: 0 Adequate Patient Visual Report: No change from baseline Vision Assessment?: No apparent visual deficits Perception  Perception: Within Functional Limits Praxis Praxis: WFL Cognition Cognition Overall Cognitive Status: Within Functional Limits for tasks assessed Arousal/Alertness: Awake/alert Memory: Appears intact Awareness: Appears intact Problem Solving: Appears intact Safety/Judgment: Appears intact Brief Interview for Mental Status (BIMS) Repetition of Three Words (First Attempt): 3 Temporal Orientation: Year: Correct Temporal Orientation: Month: Accurate within 5 days Temporal Orientation: Day: Correct Recall: Sock: Yes, no cue required Recall:  Blue: Yes, no cue required Recall: Bed: Yes, no cue required BIMS Summary Score: 15 Sensation Sensation Light Touch: Appears Intact Coordination Gross Motor Movements are Fluid and Coordinated: No Fine Motor Movements are Fluid and Coordinated: Yes Finger Nose Finger Test: [facility] B/L Motor  Motor Motor: Within Functional Limits  Trunk/Postural Assessment  Cervical Assessment Cervical Assessment: Within Functional Limits Thoracic Assessment Thoracic Assessment: Within Functional Limits Lumbar Assessment Lumbar Assessment: Within Functional Limits Postural Control Postural Control: Within Functional Limits  Balance Balance Balance Assessed: Yes Dynamic Sitting Balance Dynamic Sitting - Balance Support: No upper extremity supported Dynamic Sitting - Level of Assistance: 5: Stand by assistance Dynamic Sitting - Balance Activities: Forward  lean/weight shifting;Lateral lean/weight shifting;Reaching for objects Static Standing Balance Static Standing - Level of Assistance: 5: Stand by assistance Static Standing - Comment/# of Minutes: 2 Dynamic Standing Balance Dynamic Standing - Balance Support: Left upper extremity supported;Right upper extremity supported Dynamic Standing - Level of Assistance: 4: Min assist Dynamic Standing - Balance Activities: Reaching for objects;Forward lean/weight shifting Extremity/Trunk Assessment RUE Assessment RUE Assessment: Within Functional Limits Active Range of Motion (AROM) Comments: WFL General Strength Comments: 5/5 grossly LUE Assessment LUE Assessment: Within Functional Limits Active Range of Motion (AROM) Comments: WFL General Strength Comments: 5/5 grossly  Care Tool Care Tool Self Care Eating   Eating Assist Level: Set up assist    Oral Care    Oral Care Assist Level: Set up assist    Bathing   Body parts bathed by patient: Right arm;Left arm;Chest;Abdomen;Front perineal area;Buttocks;Right upper leg;Left upper leg;Face Body  parts bathed by helper: Left lower leg;Right lower leg;Buttocks   Assist Level: Moderate Assistance - Patient 50 - 74%    Upper Body Dressing(including orthotics)   What is the patient wearing?: Pull over shirt   Assist Level: Set up assist    Lower Body Dressing (excluding footwear)   What is the patient wearing?: Pants Assist for lower body dressing: Moderate Assistance - Patient 50 - 74%    Putting on/Taking off footwear   What is the patient wearing?: Socks Assist for footwear: Maximal Assistance - Patient 25 - 49%       Care Tool Toileting Toileting activity   Assist for toileting: Minimal Assistance - Patient > 75%     Care Tool Bed Mobility Roll left and right activity   Roll left and right assist level: Supervision/Verbal cueing    Sit to lying activity   Sit to lying assist level: Contact Guard/Touching assist    Lying to sitting on side of bed activity   Lying to sitting on side of bed assist level: the ability to move from lying on the back to sitting on the side of the bed with no back support.: Contact Guard/Touching assist     Care Tool Transfers Sit to stand transfer   Sit to stand assist level: Minimal Assistance - Patient > 75%    Chair/bed transfer   Chair/bed transfer assist level: Minimal Assistance - Patient > 75%     Toilet transfer   Assist Level: Minimal Assistance - Patient > 75%     Care Tool Cognition  Expression of Ideas and Wants Expression of Ideas and Wants: 4. Without difficulty (complex and basic) - expresses complex messages without difficulty and with speech that is clear and easy to understand  Understanding Verbal and Non-Verbal Content Understanding Verbal and Non-Verbal Content: 4. Understands (complex and basic) - clear comprehension without cues or repetitions   Memory/Recall Ability Memory/Recall Ability : Current season;Location of own room;Staff names and faces;That he or she is in a hospital/hospital unit   Refer to Care  Plan for Long Term Goals  SHORT TERM GOAL WEEK 1 OT Short Term Goal 1 (Week 1): patient will complete toileting act with sup A OT Short Term Goal 2 (Week 1): patient will complete bathing at sink or shower CGA with AE and min verbal cues OT Short Term Goal 3 (Week 1): patient will complete UB/ LB dressing SUP with AE  Recommendations for other services: None    Skilled Therapeutic Intervention  Patient agreeable to participate in OT session. Reports 6/10 pain level.   Patient participated in  skilled OT session focusing on ADLs reported levels below, functional mobility and education. Patient able to complete functional mobility and functional transfer training with RW. Patient able to verbalize understanding with education.    ADL ADL Equipment Provided: Long-handled sponge Eating: Set up Where Assessed-Eating: Chair Grooming: Setup Upper Body Bathing: Minimal cueing;Supervision/safety Where Assessed-Upper Body Bathing: Sitting at sink Lower Body Bathing: Moderate assistance Where Assessed-Lower Body Bathing: Sitting at sink Upper Body Dressing: Setup Where Assessed-Upper Body Dressing: Wheelchair Lower Body Dressing: Moderate assistance Where Assessed-Lower Body Dressing: Wheelchair Toileting: Minimal assistance Where Assessed-Toileting: Teacher, adult education: Curator Method: Proofreader: Raised toilet seat Film/video editor: Insurance underwriter Method: Designer, industrial/product: Shower seat with back Mobility  Bed Mobility Bed Mobility: Rolling Right;Rolling Left;Right Sidelying to Sit;Left Sidelying to Sit;Supine to Sit;Sitting - Scoot to Edge of Bed;Sit to Supine;Sit to Sidelying Left Rolling Right: Supervision/verbal cueing Rolling Left: Supervision/Verbal cueing Right Sidelying to Sit: Supervision/Verbal cueing Left Sidelying to Sit: Supervision/Verbal cueing Supine to Sit:  Contact Guard/Touching assist Sitting - Scoot to Edge of Bed: Supervision/Verbal cueing Sit to Supine: Contact Guard/Touching assist Sit to Sidelying Left: Contact Guard/Touching assist Transfers Sit to Stand: Minimal Assistance - Patient > 75% Stand to Sit: Minimal Assistance - Patient > 75%   Discharge Criteria: Patient will be discharged from OT if patient refuses treatment 3 consecutive times without medical reason, if treatment goals not met, if there is a change in medical status, if patient makes no progress towards goals or if patient is discharged from hospital.  The above assessment, treatment plan, treatment alternatives and goals were discussed and mutually agreed upon: by patient  Corey [name] 12/11/2023, 8:47 AM

## 2023-12-11 NOTE — Progress Notes (Signed)
 PROGRESS NOTE   Subjective/Complaints:  Pt reports doing OK- took pain meds and muscle relaxants this AM- and no significant pain-   Drain has ~ 15cc in it and in tubing- sanguinous - per nursing Drain is UNDER VAC, so cannot remove drain today due to this.  LBM  Monday Frustrated by IV's- they aren't using them.  Also, we discussed the multiple DVT's- below the knee- at high risk for propagation, so needs an IVC filter, since surgery was just a few days ago- at risk for PE- and went over risks with pt. -    Objective:   VAS US  LOWER EXTREMITY VENOUS (DVT) Result Date: 12/10/2023  Lower Venous DVT Study Patient Name:  Corey [name]  Date of Exam:   12/10/2023 Medical Rec #: [id number]       Accession #:    [id number] Date of Birth: 17-Jan-1966      Patient Gender: M Patient Age:   58 years Exam Location:  [facility] Procedure:      VAS US  LOWER EXTREMITY VENOUS (DVT) Referring Phys: TORIBIO PITCH --------------------------------------------------------------------------------  Other Indications: Severe progressive thoracic myelopathy, status post T1-T4                    posterior spinal instrumentation and Fusion, T3                    Transpedicular decompression, additional laminectomies                    12/07/2023 at [facility]. Patient newly admitted to rehabilitation. Risk Factors: Immobility. Comparison Study: No prior study on file Performing Technologist: Alberta Lis RVS  Examination Guidelines: A complete evaluation includes B-mode imaging, spectral Doppler, color Doppler, and power Doppler as needed of all accessible portions of each vessel. Bilateral testing is considered an integral part of a complete examination. Limited examinations for reoccurring indications may be performed as noted. The reflux portion of the exam is performed with the patient in reverse Trendelenburg.   +---------+---------------+---------+-----------+----------+-------------------+ RIGHT    CompressibilityPhasicitySpontaneityPropertiesThrombus Aging      +---------+---------------+---------+-----------+----------+-------------------+ CFV      Full           Yes      Yes                                      +---------+---------------+---------+-----------+----------+-------------------+ SFJ      Full                                                             +---------+---------------+---------+-----------+----------+-------------------+ FV Prox  Full           Yes      Yes                                      +---------+---------------+---------+-----------+----------+-------------------+  FV Mid   Full                                                             +---------+---------------+---------+-----------+----------+-------------------+ FV DistalFull                                                             +---------+---------------+---------+-----------+----------+-------------------+ PFV      Full           Yes      Yes                                      +---------+---------------+---------+-----------+----------+-------------------+ POP      Full           Yes      Yes                                      +---------+---------------+---------+-----------+----------+-------------------+ PTV      None                                         Acute proximal calf +---------+---------------+---------+-----------+----------+-------------------+ PERO     None                                         Acute mid calf      +---------+---------------+---------+-----------+----------+-------------------+ Gastroc  None           No       No                   Acute               +---------+---------------+---------+-----------+----------+-------------------+   +---------+---------------+---------+-----------+----------+--------------+ LEFT      CompressibilityPhasicitySpontaneityPropertiesThrombus Aging +---------+---------------+---------+-----------+----------+--------------+ CFV      Full           Yes      Yes                                 +---------+---------------+---------+-----------+----------+--------------+ SFJ      Full                                                        +---------+---------------+---------+-----------+----------+--------------+ FV Prox  Full                                                        +---------+---------------+---------+-----------+----------+--------------+  FV Mid   Full                                                        +---------+---------------+---------+-----------+----------+--------------+ FV DistalFull                                                        +---------+---------------+---------+-----------+----------+--------------+ PFV      Full                                                        +---------+---------------+---------+-----------+----------+--------------+ POP      Full           Yes      Yes                                 +---------+---------------+---------+-----------+----------+--------------+ PTV      None                                         Acute distally +---------+---------------+---------+-----------+----------+--------------+ PERO     None                                         Acute distally +---------+---------------+---------+-----------+----------+--------------+ Gastroc  Full                                                        +---------+---------------+---------+-----------+----------+--------------+     Summary: RIGHT: - Findings consistent with acute deep vein thrombosis involving the right proximal posterior tibial veins, and the right mid peroneal veins. Findings consistent with acute intramuscular thrombosis involving the right gastrocnemius veins. - No cystic structure found  in the popliteal fossa.  LEFT: - Findings consistent with acute deep vein thrombosis involving the distal left posterior tibial veins, and left peroneal veins.  - No cystic structure found in the popliteal fossa.  *See table(s) above for measurements and observations. Electronically signed by Gaile New MD on 12/10/2023 at 5:17:35 PM.    Final    Recent Labs    12/09/23 0245  HGB 10.2*   No results for input(s): NA, K, CL, CO2, GLUCOSE, BUN, CREATININE, CALCIUM  in the last 72 hours. No intake or output data in the 24 hours ending 12/11/23 0912      Physical Exam: Vital Signs Blood pressure 109/72, pulse 97, temperature 98.2 F (36.8 C), temperature source Oral, resp. rate 18, SpO2 99%.    General: awake, alert, appropriate, initially seen sitting up in bed; 2nd time in chair at bedside; after OT; NAD HENT: conjugate gaze; oropharynx moist CV: regular rate  and rhythm- rate in 90's; no JVD Pulmonary: CTA B/L; no W/R/R- good air movement GI: soft, NT, somewhat distended- hypoactive BS Psychiatric: appropriate, interactive, quiet Neurological: Ox3 Skin: C/D/I. No apparent lesions.              - Accordion drain with minimal gross bloody output              - Wound vac, well-sealed, no active drainage- looks good- small VAC in place   MSK:      No apparent deformity. + L knee TKR scar. R knee extension slightly limited 0-5 degrees.       Neurologic exam:  Cognition: AAO to person, place, time and event.  Language: Fluent, No substitutions or neoglisms. No dysarthria. Names 3/3 objects correctly.  Memory: Recalls 3/3 objects at 5 minutes. No apparent deficits  Insight: Good  insight into current condition.  Mood: Pleasant affect, appropriate mood.  Sensation: To light touch intact in BL UEs and Les; notes sensory decrease in all bilateral toes Reflexes: 2+ in BL UE and LEs. Negative Hoffman's and babinski signs bilaterally.  CN: 2-12 grossly intact.   Coordination: No apparent tremors. + LLE ataxia with HTS; otherwise intact Spasticity: MAS 0 in all extremities.       Strength:                RUE: 5/5 SA, 5/5 EF, 5/5 EE, 5/5 WE, 5/5 FF, 5/5 FA                LUE:  5/5 SA, 5/5 EF, 5/5 EE, 5/5 WE, 5/5 FF, 5/5 FA                RLE: 4/5 HF, 5/5 KE, 5/5  DF, 5/5  EHL, 5/5  PF                 LLE:  4-/5 HF, 4-/5 KE, 5/5  DF, 5/5  EHL, 5/5  PF     Assessment/Plan: 1. Functional deficits which require 3+ hours per day of interdisciplinary therapy in a comprehensive inpatient rehab setting. Physiatrist is providing close team supervision and 24 hour management of active medical problems listed below. Physiatrist and rehab team continue to assess barriers to discharge/monitor patient progress toward functional and medical goals  Care Tool:  Bathing    Body parts bathed by patient: Right arm, Left arm, Chest, Abdomen, Front perineal area, Buttocks, Right upper leg, Left upper leg, Face   Body parts bathed by helper: Left lower leg, Right lower leg, Buttocks     Bathing assist Assist Level: Moderate Assistance - Patient 50 - 74%     Upper Body Dressing/Undressing Upper body dressing   What is the patient wearing?: Pull over shirt    Upper body assist Assist Level: Set up assist    Lower Body Dressing/Undressing Lower body dressing      What is the patient wearing?: Pants     Lower body assist Assist for lower body dressing: Moderate Assistance - Patient 50 - 74%     Toileting Toileting    Toileting assist Assist for toileting: Minimal Assistance - Patient > 75%     Transfers Chair/bed transfer  Transfers assist     Chair/bed transfer assist level: Minimal Assistance - Patient > 75%     Locomotion Ambulation   Ambulation assist              Walk 10 feet activity   Assist  Walk 50 feet activity   Assist           Walk 150 feet activity   Assist           Walk 10 feet on  uneven surface  activity   Assist           Wheelchair     Assist               Wheelchair 50 feet with 2 turns activity    Assist            Wheelchair 150 feet activity     Assist          Blood pressure 109/72, pulse 97, temperature 98.2 F (36.8 C), temperature source Oral, resp. rate 18, SpO2 99%.   Medical Problem List and Plan: 1. Functional deficits secondary to thoracic myelopathy.  Status post T3 transpedicular decompression, T1-T4 PSF 12/07/2023 per Dr. Penne Sharps neurosurgery.NO BRACE REQUIRED.  As per neurosurgery postoperative drain to be removed 12/11/2022.  Wound VAC can be removed when loses suction or when the battery runs down.             -patient may not shower until vac removed             -ELOS/Goals: 10-12 days, Mod I PT/OT              First day of evaluations-con't CIR PT and OT- cannot get shower due to [facility]  Cannot remove DRAIN today- due to it being under wound VAC- needs to be remove MONDAY 7/14- by nursing, by changing VAC. 2.  Antithrombotics: -DVT/anticoagulation:  Pharmaceutical: Lovenox .  Check vascular study             -antiplatelet therapy: N/A              - 7/11: Admission duplex + DVT R proximal posterior tibial veins, R mid-peroneal veins, L posterior tibial veins, L peroneal veins, and intramuscular thrombosis on R gastrocnemius veins. Rolan Haggard d/w Neurosurgery Dr. Theressa office, unable to start full dose AC until 7d post-op. Recommend continuation of PPX dose lovenox  40 mg daily with repeat Duplex Monday to ensure no extension.              -- Later, messaged by Dr. Sharps requesting additional consult for IVC filter; order placed, d/w [facility] PA for follow-up in AM with IR for eval   7/12- spoke with IR_ will do IVC filter tomorrow- and will make him NPO- went back and spoke to pt about plan 3. Pain Management: Robaxin  and oxycodone  as needed 4. Mood/Behavior/Sleep: Paxil  20 mg daily              -antipsychotic agents:  5. Neuropsych/cognition: This patient is capable of making decisions on his own behalf. 6. Skin/Wound Care: Routine skin checks              - Per NSGY, HV drain can be removed in AM 7/12; order placed              - Wound vac to be removed once suction is lost/batteries run out   7/12- will have to remove VAC and put back on, because drain under VAC material 7. Fluids/Electrolytes/Nutrition: Routine in and outs with follow-up chemistries 8.  Constipation.  Colace 100 mg twice daily, MiraLAX  daily as needed             - LBM Mon/Tues per patient 9.  OSA.  CPAP  10.  Hypertension.  Currently on no antihypertensive medications or prior to admission.  Monitor with increased mobility  7/12- BP doing better- will con't to monitor with activity  11. Anemia. 14->12->10 hgb, likely post-surgical. Monitor labs. Iron-Vitamin C -B12-FA supplement.    7/12- will check labs Monday    I spent a total of 58   minutes on total care today- >50% coordination of care- due to  D/w pt x2, d/w nursing at length- also d/w IR about IVC filter and with PA about issues related to IVC filter. Also reviewed chart and labs, vitals-   LOS: 1 days A FACE TO FACE EVALUATION WAS PERFORMED  Charlaine Utsey 12/11/2023, 9:12 AM

## 2023-12-11 NOTE — Plan of Care (Signed)
  Problem: RH Balance Goal: LTG: Patient will maintain dynamic sitting balance (OT) Description: LTG:  Patient will maintain dynamic sitting balance with assistance during activities of daily living (OT) Flowsheets (Taken 12/11/2023 1006) LTG: Pt will maintain dynamic sitting balance during ADLs with: Independent with assistive device Goal: LTG Patient will maintain dynamic standing with ADLs (OT) Description: LTG:  Patient will maintain dynamic standing balance with assist during activities of daily living (OT)  Flowsheets (Taken 12/11/2023 1006) LTG: Pt will maintain dynamic standing balance during ADLs with: Independent with assistive device   Problem: Sit to Stand Goal: LTG:  Patient will perform sit to stand in prep for activites of daily living with assistance level (OT) Description: LTG:  Patient will perform sit to stand in prep for activites of daily living with assistance level (OT) Flowsheets (Taken 12/11/2023 1006) LTG: PT will perform sit to stand in prep for activites of daily living with assistance level: Independent with assistive device   Problem: RH Eating Goal: LTG Patient will perform eating w/assist, cues/equip (OT) Description: LTG: Patient will perform eating with assist, with/without cues using equipment (OT) Flowsheets (Taken 12/11/2023 1006) LTG: Pt will perform eating with assistance level of: Independent with assistive device    Problem: RH Grooming Goal: LTG Patient will perform grooming w/assist,cues/equip (OT) Description: LTG: Patient will perform grooming with assist, with/without cues using equipment (OT) Flowsheets (Taken 12/11/2023 1006) LTG: Pt will perform grooming with assistance level of: Independent with assistive device    Problem: RH Bathing Goal: LTG Patient will bathe all body parts with assist levels (OT) Description: LTG: Patient will bathe all body parts with assist levels (OT) Flowsheets (Taken 12/11/2023 1006) LTG: Pt will perform bathing  with assistance level/cueing: Independent with assistive device    Problem: RH Dressing Goal: LTG Patient will perform upper body dressing (OT) Description: LTG Patient will perform upper body dressing with assist, with/without cues (OT). Flowsheets (Taken 12/11/2023 1006) LTG: Pt will perform upper body dressing with assistance level of: Independent with assistive device Goal: LTG Patient will perform lower body dressing w/assist (OT) Description: LTG: Patient will perform lower body dressing with assist, with/without cues in positioning using equipment (OT) Flowsheets (Taken 12/11/2023 1006) LTG: Pt will perform lower body dressing with assistance level of: Independent with assistive device   Problem: RH Toileting Goal: LTG Patient will perform toileting task (3/3 steps) with assistance level (OT) Description: LTG: Patient will perform toileting task (3/3 steps) with assistance level (OT)  Flowsheets (Taken 12/11/2023 1006) LTG: Pt will perform toileting task (3/3 steps) with assistance level: Independent with assistive device   Problem: RH Toilet Transfers Goal: LTG Patient will perform toilet transfers w/assist (OT) Description: LTG: Patient will perform toilet transfers with assist, with/without cues using equipment (OT) Flowsheets (Taken 12/11/2023 1006) LTG: Pt will perform toilet transfers with assistance level of: Independent with assistive device

## 2023-12-11 NOTE — Progress Notes (Signed)
   12/11/23 0052  BiPAP/CPAP/SIPAP  $ Non-Invasive Home Ventilator  Subsequent  BiPAP/CPAP/SIPAP Pt Type Adult  BiPAP/CPAP/SIPAP Resmed  Mask Type Nasal mask  Dentures removed? Not applicable  Respiratory Rate 18 breaths/min  Patient Home Machine Yes  Safety Check Completed by RT for Home Unit Yes, no issues noted  Patient Home Mask Yes  Patient Home Tubing Yes  Auto Titrate No  CPAP/SIPAP surface wiped down Yes  Device Plugged into RED Power Outlet Yes  BiPAP/CPAP /SiPAP Vitals  Bilateral Breath Sounds Clear

## 2023-12-11 NOTE — Progress Notes (Addendum)
 Occupational Therapy Session Note  Patient Details  Name: Corey [name] MRN: [id number] Date of Birth: 1965-09-07  Today's Date: 12/11/2023 OT Individual Time: 1300-1345 OT Individual Time Calculation (min): 45 min    Short Term Goals: Week 1:  OT Short Term Goal 1 (Week 1): patient will complete toileting act with sup A OT Short Term Goal 2 (Week 1): patient will complete bathing at sink or shower CGA with AE and min verbal cues OT Short Term Goal 3 (Week 1): patient will complete UB/ LB dressing SUP with AE  Skilled Therapeutic Interventions/Progress Updates:    Patient agreeable to participate in OT session. Reports 2/10 pain level.   Patient participated in skilled OT session focusing on functional mobility with proper balance, UE/ LE strengthening, functional activity tolerance. Patient completed functional mobility 45 ft and 75 ft with one hand support and CG to min A for proper balance in hallway to increase safety with mobility related to ADLs. OT facilitated proper positioning, balance, and speed to maximize safety. Pt completed Nustep activity for 10 minutes to increase functional activity tolerance. Patient returned to room with all needs met, call light in reach.   Therapy Documentation Precautions:  Precautions Precautions: Fall, Back Precaution Booklet Issued: Yes (comment) Recall of Precautions/Restrictions: Intact Precaution/Restrictions Comments: spinal precautions, no brace needed per orders Restrictions Weight Bearing Restrictions Per Provider Order: No  Therapy/Group: Individual Therapy  D'mariea L Waldon Sheerin 12/11/2023, 3:11 PM

## 2023-12-11 NOTE — Consult Note (Signed)
 Chief Complaint: Patient was seen in consultation today for DVT  Referring Physician(s): Dr. Cornelio  Supervising Physician: Luverne Aran  Patient Status: [facility] - In-pt  History of Present Illness: Corey [name] is a 58 y.o. male with a medical history significant for HTN, depression, sleep apnea and recent spine surgery for thoracic foraminal narrowing. This procedure was performed 12/10/23 by Neurosurgery. Imaging obtained during his hospitalization was positive for bilateral DVTs. Patient is currently on lovenox  but due to patient's limited mobility he is at high risk for stroke or PE.   Interventional Radiology has been asked to evaluate this patient for an image-guided inferior vena cava filter placement. Imaging reviewed and procedure approved by Dr. Luverne.   Past Medical History:  Diagnosis Date   Arthritis    knee, left   ED (erectile dysfunction)    Hyperlipidemia    Hypertension    Sleep apnea    CPAP - not using lately due to recall     Past Surgical History:  Procedure Laterality Date   ANKLE BONE SURGERY     APPLICATION OF INTRAOPERATIVE CT SCAN  12/07/2023   Procedure: APPLICATION OF INTRAOPERATIVE CT SCAN;  Surgeon: Claudene Penne ORN, MD;  Location: [facility] ORS;  Service: Neurosurgery;;   BACK SURGERY     COLLAR BONE SURGEY     COLONOSCOPY     FRACTURE SURGERY     HERNIA REPAIR     KNEE ARTHROSCOPY Left 2006   LAPAROSCOPIC APPENDECTOMY N/A 01/13/2021   Procedure: APPENDECTOMY LAPAROSCOPIC WITH LYSIS OF ADHESIONS;  Surgeon: Sheldon Standing, MD;  Location: [facility] ORS;  Service: General;  Laterality: N/A;   LUMBAR MICRODISCECTOMY     POLYPECTOMY     SKIN BIOPSY Left 08/11/2021   nodulocystic fat nercrosis   TOTAL KNEE ARTHROPLASTY Left 11/11/2020   Procedure: LEFT TOTAL KNEE ARTHROPLASTY;  Surgeon: Barbarann Oneil BROCKS, MD;  Location: [facility] OR;  Service: Orthopedics;  Laterality: Left;   UMBILICAL HERNIA REPAIR      Allergies: Penicillins  Medications: Prior to  Admission medications   Medication Sig Start Date End Date Taking? Authorizing Provider  acetaminophen  (TYLENOL ) 500 MG tablet Take 2 tablets (1,000 mg total) by mouth every 6 (six) hours. 12/10/23   Caleen Qualia, MD  cyanocobalamin  (VITAMIN B12) 1000 MCG/ML injection Inject 1 mL (1,000 mcg total) into the muscle daily for 6 days. 12/11/23 12/17/23  Amin, Sumayya, MD  docusate sodium  (COLACE) 100 MG capsule Take 1 capsule (100 mg total) by mouth 2 (two) times daily. 12/10/23   Amin, Sumayya, MD  Fe Fum-Vit C-Vit B12-FA (TRIGELS-F FORTE) CAPS capsule Take 1 capsule by mouth 2 (two) times daily. 12/10/23   Amin, Sumayya, MD  methocarbamol  (ROBAXIN ) 500 MG tablet Take 1 tablet (500 mg total) by mouth every 6 (six) hours as needed for muscle spasms. 11/11/23   Persons, Ronal Dragon, PA  Multiple Vitamins-Minerals (HAIR SKIN AND NAILS FORMULA PO) Take by mouth.    [provider]  Multiple Vitamins-Minerals (MULTIVITAMIN WITH MINERALS) tablet Take 2 tablets by mouth daily.    [provider]  oxyCODONE  (OXY IR/ROXICODONE ) 5 MG immediate release tablet Take 1 tablet (5 mg total) by mouth every 6 (six) hours as needed for moderate pain (pain score 4-6) or severe pain (pain score 7-10). 12/10/23   Caleen Qualia, MD  PARoxetine  (PAXIL ) 20 MG tablet TAKE 1 TABLET BY MOUTH EVERY DAY IN THE MORNING Patient taking differently: Take 20 mg by mouth in the morning. 11/11/23  Lalonde, John C, MD  polyethylene glycol (MIRALAX  / GLYCOLAX ) 17 g packet Take 17 g by mouth daily as needed for mild constipation. 12/10/23   Amin, Sumayya, MD  senna (SENOKOT) 8.6 MG TABS tablet Take 1 tablet (8.6 mg total) by mouth 2 (two) times daily. 12/10/23   Caleen Qualia, MD     Family History  Problem Relation Age of Onset   Alzheimer's disease Mother    Stevens-Johnson syndrome Father    Autism Brother    Colon cancer Other    Colon polyps Neg Hx    Esophageal cancer Neg Hx    Rectal cancer Neg Hx    Stomach cancer Neg  Hx     Social History   Socioeconomic History   Marital status: Married    Spouse name: Not on file   Number of children: 1   Years of education: Not on file   Highest education level: Some college, no degree  Occupational History   Occupation: Health and safety inspector  Tobacco Use   Smoking status: Never   Smokeless tobacco: Never  Vaping Use   Vaping status: Never Used  Substance and Sexual Activity   Alcohol use: Yes    Alcohol/week: 3.0 standard drinks of alcohol    Types: 3 Glasses of wine per week    Comment: occasional   Drug use: Not Currently    Types: Marijuana   Sexual activity: Yes    Partners: Female  Other Topics Concern   Not on file  Social History Narrative   Not on file   Social Drivers of Health   Financial Resource Strain: Low Risk  (08/08/2021)   Overall Financial Resource Strain (CARDIA)    Difficulty of Paying Living Expenses: Not hard at all  Food Insecurity: No Food Insecurity (12/07/2023)   Hunger Vital Sign    Worried About Running Out of Food in the Last Year: Never true    Ran Out of Food in the Last Year: Never true  Transportation Needs: No Transportation Needs (12/07/2023)   PRAPARE - Administrator, Civil Service (Medical): No    Lack of Transportation (Non-Medical): No  Physical Activity: Sufficiently Active (08/08/2021)   Exercise Vital Sign    Days of Exercise per Week: 3 days    Minutes of Exercise per Session: 90 min  Stress: Stress Concern Present (08/08/2021)   [employer] of Occupational Health - Occupational Stress Questionnaire    Feeling of Stress : To some extent  Social Connections: Moderately Integrated (12/07/2023)   Social Connection and Isolation Panel    Frequency of Communication with Friends and Family: More than three times a week    Frequency of Social Gatherings with Friends and Family: Twice a week    Attends Religious Services: More than 4 times per year    Active Member of [employer] or Organizations: Yes     Attends Banker Meetings: 1 to 4 times per year    Marital Status: Never married    Review of Systems: A 12 point ROS discussed and pertinent positives are indicated in the HPI above.  All other systems are negative.  Review of Systems  Neurological:  Positive for weakness.  All other systems reviewed and are negative.   Vital Signs: BP 109/72 (BP Location: Left Arm)   Pulse 97   Temp 98.2 F (36.8 C) (Oral)   Resp 18   SpO2 99%   Physical Exam Constitutional:      General: He is  not in acute distress.    Appearance: He is not ill-appearing.  HENT:     Mouth/Throat:     Mouth: Mucous membranes are moist.     Pharynx: Oropharynx is clear.  Cardiovascular:     Rate and Rhythm: Normal rate.  Pulmonary:     Effort: Pulmonary effort is normal.  Abdominal:     Tenderness: There is no abdominal tenderness.  Musculoskeletal:     Comments: Wound vac and drain to the upper back. Sites not viewed.   Skin:    General: Skin is warm and dry.  Neurological:     Mental Status: He is alert and oriented to person, place, and time.      Labs:  CBC: Recent Labs    12/06/23 1700 12/07/23 0507 12/07/23 0848 12/08/23 0312 12/09/23 0245  WBC 6.5 6.3 5.0 9.7  --   HGB 13.3 12.6* 12.9* 12.0* 10.2*  HCT 42.0 39.3 40.2 37.8*  --   PLT 263 254 254 240  --     COAGS: Recent Labs    12/06/23 1700  INR 1.0  APTT 27    BMP: Recent Labs    12/06/23 1700 12/07/23 0507 12/07/23 0848 12/08/23 0312  NA 140 142 142 139  K 3.9 3.5 4.0 4.6  CL 103 104 106 105  CO2 27 27 28 26   GLUCOSE 89 124* 115* 160*  BUN 15 15 14 15   CALCIUM  9.3 8.8* 8.8* 8.5*  CREATININE 1.14 1.29* 1.23 1.19  GFRNONAA >60 >60 >60 >60    LIVER FUNCTION TESTS: Recent Labs    11/23/23 1539  BILITOT 0.4  AST 19  ALT 32  ALKPHOS 79  PROT 7.1  ALBUMIN  4.4    TUMOR MARKERS: No results for input(s): AFPTM, CEA, CA199, CHROMGRNA in the last 8760 hours.  Assessment and  Plan:  Bilateral lower extremity DVTs: Corey [name], 58 year old male, is tentatively scheduled 12/12/23 for an image-guided inferior vena cava filter placement.   Risks and benefits discussed with the patient including, but not limited to bleeding, infection, contrast induced renal failure, filter fracture or migration which can lead to emergency surgery or even death, strut penetration with damage or irritation to adjacent structures and caval thrombosis.  All of the patient's questions were answered, patient is agreeable to proceed. He will be NPO at midnight. Morning labs (CBC, PT/INR and BMP) will be obtained.   Consent signed and in IR.  Thank you for this interesting consult.  I greatly enjoyed meeting Corey [name] and look forward to participating in their care.  A copy of this report was sent to the requesting provider on this date.  Electronically Signed: Warren Dais, AGACNP-BC 12/11/2023, 9:46 AM   I spent a total of 20 Minutes    in face to face in clinical consultation, greater than 50% of which was counseling/coordinating care for DVT.

## 2023-12-12 ENCOUNTER — Inpatient Hospital Stay (HOSPITAL_COMMUNITY)

## 2023-12-12 DIAGNOSIS — I82403 Acute embolism and thrombosis of unspecified deep veins of lower extremity, bilateral: Secondary | ICD-10-CM | POA: Diagnosis not present

## 2023-12-12 DIAGNOSIS — M4714 Other spondylosis with myelopathy, thoracic region: Secondary | ICD-10-CM | POA: Diagnosis not present

## 2023-12-12 HISTORY — PX: IR IVC FILTER PLMT / S&I /IMG GUID/MOD SED: IMG701

## 2023-12-12 LAB — CBC
HCT: 32.2 % — ABNORMAL LOW (ref 39.0–52.0)
Hemoglobin: 10.3 g/dL — ABNORMAL LOW (ref 13.0–17.0)
MCH: 27.5 pg (ref 26.0–34.0)
MCHC: 32 g/dL (ref 30.0–36.0)
MCV: 85.9 fL (ref 80.0–100.0)
Platelets: 235 K/uL (ref 150–400)
RBC: 3.75 MIL/uL — ABNORMAL LOW (ref 4.22–5.81)
RDW: 14.4 % (ref 11.5–15.5)
WBC: 8.8 K/uL (ref 4.0–10.5)
nRBC: 0 % (ref 0.0–0.2)

## 2023-12-12 LAB — BASIC METABOLIC PANEL WITH GFR
Anion gap: 9 (ref 5–15)
BUN: 12 mg/dL (ref 6–20)
CO2: 27 mmol/L (ref 22–32)
Calcium: 8.6 mg/dL — ABNORMAL LOW (ref 8.9–10.3)
Chloride: 103 mmol/L (ref 98–111)
Creatinine, Ser: 1.12 mg/dL (ref 0.61–1.24)
GFR, Estimated: >60 mL/min (ref >60)
Glucose, Bld: 114 mg/dL — ABNORMAL HIGH (ref 70–99)
Potassium: 3.8 mmol/L (ref 3.5–5.1)
Sodium: 139 mmol/L (ref 135–145)

## 2023-12-12 LAB — PROTIME-INR
INR: 1 (ref 0.8–1.2)
Prothrombin Time: 13.5 s (ref 11.4–15.2)

## 2023-12-12 MED ORDER — LIDOCAINE HCL 1 % IJ SOLN
10.0000 mL | Freq: Once | INTRAMUSCULAR | Status: AC
  Administered 2023-12-12: 8 mL

## 2023-12-12 MED ORDER — FENTANYL CITRATE (PF) 100 MCG/2ML IJ SOLN
INTRAMUSCULAR | Status: AC | PRN
  Administered 2023-12-12 (×2): 50 ug via INTRAVENOUS

## 2023-12-12 MED ORDER — MIDAZOLAM HCL 2 MG/2ML IJ SOLN
INTRAMUSCULAR | Status: AC
  Filled 2023-12-12: qty 2

## 2023-12-12 MED ORDER — IOHEXOL 300 MG/ML  SOLN
150.0000 mL | Freq: Once | INTRAMUSCULAR | Status: AC | PRN
  Administered 2023-12-12: 40 mL via INTRAVENOUS

## 2023-12-12 MED ORDER — FENTANYL CITRATE (PF) 100 MCG/2ML IJ SOLN
INTRAMUSCULAR | Status: AC
Start: 2023-12-12 — End: 2023-12-12
  Filled 2023-12-12: qty 2

## 2023-12-12 MED ORDER — LIDOCAINE HCL 1 % IJ SOLN
INTRAMUSCULAR | Status: AC
  Filled 2023-12-12: qty 20

## 2023-12-12 MED ORDER — MIDAZOLAM HCL 2 MG/2ML IJ SOLN
INTRAMUSCULAR | Status: AC | PRN
  Administered 2023-12-12 (×2): 1 mg via INTRAVENOUS

## 2023-12-12 NOTE — Progress Notes (Addendum)
 Physical Therapy Note  Patient Details  Name: HAYLEN SHELNUTT MRN: [id number] Date of Birth: [birth year] Today's Date: 12/12/2023    Pt just returned from IVC filter placement and on bedrest x 2 hours.  No treatment given.  Will return for PM session. Missed time of 60 min.  Reyes SHAUNNA Sierra 12/12/2023, 10:22 AM

## 2023-12-12 NOTE — Progress Notes (Signed)
 Physical Therapy Session Note  Patient Details  Name: Corey [name] MRN: [id number] Date of Birth: December 19, 1965  Today's Date: 12/12/2023 PT Individual Time: 1416-1531 PT Individual Time Calculation (min): 75 min   Short Term Goals: Week 1:  PT Short Term Goal 1 (Week 1): STG = LTG due to ELOS  Skilled Therapeutic Interventions/Progress Updates: Pt presents sitting in recliner and agreeable to therapy.  Pt had IVC filter placed this AM as well as drain and wound vac D/C'd.  Pt transfers all surfaces during session w/ min/CGA w/ increased time and heavy reliance on UES.  Pt amb x 75' w/ RW and min A.  Pt wheeled the remainder to small gym.  Pt amb to UBE and performed x 10' at Level 3 for maintaining 55 rpm and total of 465 rotations for UE strength and endurance.  Pt performed standing beanbag toss on B sides, but throwing w/ L hand.  Pt then performed incorporating step forward w/ L foot and throw.  Pt amb x 150' w/ RW and min/CGA until fatigue, 1 knee unsteadiness but not true buckle.  Pt LLE clips R heel w/ advancement, states previous to beginning of need for hospital admission 2/2 TKR x 3 years ago.  Pt amb from hallway to recliner and remained sitting w/ all needs in reach.     Therapy Documentation Precautions:  Precautions Precautions: Fall, Back Precaution Booklet Issued: Yes (comment) Recall of Precautions/Restrictions: Intact Precaution/Restrictions Comments: spinal precautions, no brace needed per orders Restrictions Weight Bearing Restrictions Per Provider Order: No General:   Vital Signs: Therapy Vitals Temp: 99 F (37.2 C) Temp Source: Oral Pulse Rate: 89 Resp: 18 BP: 124/80 Patient Position (if appropriate): Lying Oxygen Therapy SpO2: 99 % O2 Device: Room Air Pain:0/10       Therapy/Group: Individual Therapy  Adriane Gabbert P Lynelle Weiler 12/12/2023, 3:39 PM

## 2023-12-12 NOTE — Discharge Summary (Signed)
 Physician Discharge Summary  Patient ID: Corey [name] MRN: [id number] DOB/AGE: 1965-06-22 58 y.o.  Admit date: 12/10/2023 Discharge date: 12/20/2023  Discharge Diagnoses:  Principal Problem:   Thoracic myelopathy Active Problems:   Coping style affecting medical condition DVT right proximal posterior tibial vein, right mid peroneal vein, left posterior tibial vein, left peroneal vein and intramuscular thrombosis and right gastrocnemius vein OSA Constipation Hypertension Anemia Mood stabilization  Discharged Condition: Stable  Significant Diagnostic Studies: VAS US  LOWER EXTREMITY VENOUS (DVT) Result Date: 12/13/2023  Lower Venous DVT Study Patient Name:  Corey [name]  Date of Exam:   12/13/2023 Medical Rec #: [id number]       Accession #:    [id number] Date of Birth: 1965-07-25      Patient Gender: M Patient Age:   58 years Exam Location:  [facility] Procedure:      VAS US  LOWER EXTREMITY VENOUS (DVT) Referring Phys: JOESPH LIKES --------------------------------------------------------------------------------  Indications: Hx of BLE DVT.  Risk Factors: Surgery Back surgery 12/07/2023, IVC filter placement 12/12/2023. Comparison Study: 12/10/2023 RT prox PTV, Mid Pero v, and gatroc vein acute DVT                   and left distal PTV and pero vein acute DVT. Performing Technologist: Elmarie Lindau, RVT  Examination Guidelines: A complete evaluation includes B-mode imaging, spectral Doppler, color Doppler, and power Doppler as needed of all accessible portions of each vessel. Bilateral testing is considered an integral part of a complete examination. Limited examinations for reoccurring indications may be performed as noted. The reflux portion of the exam is performed with the patient in reverse Trendelenburg.  +---------+---------------+---------+-----------+----------+--------------+ RIGHT    CompressibilityPhasicitySpontaneityPropertiesThrombus Aging  +---------+---------------+---------+-----------+----------+--------------+ CFV      Full           Yes      Yes                                 +---------+---------------+---------+-----------+----------+--------------+ SFJ      Full                                                        +---------+---------------+---------+-----------+----------+--------------+ FV Prox  Full                                                        +---------+---------------+---------+-----------+----------+--------------+ FV Mid   Full                                                        +---------+---------------+---------+-----------+----------+--------------+ FV DistalFull                                                        +---------+---------------+---------+-----------+----------+--------------+ PFV  Full                                                        +---------+---------------+---------+-----------+----------+--------------+ POP      Full           Yes      Yes                                 +---------+---------------+---------+-----------+----------+--------------+ PTV      Full                                                        +---------+---------------+---------+-----------+----------+--------------+ PERO     Full                                                        +---------+---------------+---------+-----------+----------+--------------+ Gastroc  None                                                        +---------+---------------+---------+-----------+----------+--------------+ There is acute appearing, noncompressible thrombus in 1 of 2 veins in both sets of gastroc veins.   +---------+---------------+---------+-----------+----------+--------------+ LEFT     CompressibilityPhasicitySpontaneityPropertiesThrombus Aging +---------+---------------+---------+-----------+----------+--------------+ CFV      Full            Yes      Yes                                 +---------+---------------+---------+-----------+----------+--------------+ SFJ      Full                                                        +---------+---------------+---------+-----------+----------+--------------+ FV Prox  Full                                                        +---------+---------------+---------+-----------+----------+--------------+ FV Mid   Full                                                        +---------+---------------+---------+-----------+----------+--------------+ FV DistalFull                                                        +---------+---------------+---------+-----------+----------+--------------+  PFV      Full                                                        +---------+---------------+---------+-----------+----------+--------------+ POP      Full           Yes      Yes                                 +---------+---------------+---------+-----------+----------+--------------+ PTV      Full                                                        +---------+---------------+---------+-----------+----------+--------------+ PERO     Full                                                        +---------+---------------+---------+-----------+----------+--------------+     Summary: RIGHT: - Findings consistent with acute deep vein thrombosis involving the gastrocnemius veins.  - No cystic structure found in the popliteal fossa. - There has been total resolution of the right proximal posterior tibial and mid peroneal veins thrombus.  LEFT: - There is no evidence of deep vein thrombosis in the lower extremity.  - No cystic structure found in the popliteal fossa. - There has been total resolution of the left distal posterior tibial and peroneal veins.  *See table(s) above for measurements and observations. Electronically signed by Fonda Rim on 12/13/2023 at 6:50:42  PM.    Final    IR IVC FILTER PLMT / S&I PORTER GUID/MOD SED Result Date: 12/12/2023 CLINICAL DATA:  Bilateral lower extremity DVT and status post thoracic spinal surgery. Need for DVT due to contraindication currently to therapeutic anticoagulation. EXAM: 1. ULTRASOUND GUIDANCE FOR VASCULAR ACCESS OF THE RIGHT INTERNAL JUGULAR VEIN. 2. IVC VENOGRAM. 3. PERCUTANEOUS IVC FILTER PLACEMENT. ANESTHESIA/SEDATION: Moderate (conscious) sedation was employed during this procedure. A total of Versed  2.0 mg and Fentanyl  100 mcg was administered intravenously. Moderate Sedation Time: 15 minutes. The patient's level of consciousness and vital signs were monitored continuously by radiology nursing throughout the procedure under my direct supervision. CONTRAST:  40mL OMNIPAQUE  IOHEXOL  300 MG/ML  SOLN FLUOROSCOPY: Radiation Exposure Index: 107.6 mGy Kerma PROCEDURE: The procedure, risks, benefits, and alternatives were explained to the patient. Questions regarding the procedure were encouraged and answered. The patient understands and consents to the procedure. A time-out was performed prior to initiating the procedure. The right neck was prepped with chlorhexidine  in a sterile fashion, and a sterile drape was applied covering the operative field. A sterile gown and sterile gloves were used for the procedure. Local anesthesia was provided with 1% Lidocaine . Ultrasound was utilized to confirm patency of the right internal jugular vein. An ultrasound image was saved and recorded. Under direct ultrasound guidance, a 21 gauge needle was advanced into the right internal jugular vein with ultrasound image documentation performed. After securing access with a micropuncture dilator, a  guidewire was advanced into the inferior vena cava. A deployment sheath was advanced over the guidewire. This was utilized to perform IVC venography. The deployment sheath was further positioned in an appropriate location for filter deployment. A Bard  Denali IVC filter was then advanced in the sheath. This was then fully deployed in the infrarenal IVC. Final filter position was confirmed with a fluoroscopic spot image. After the procedure the sheath was removed and hemostasis obtained with manual compression. COMPLICATIONS: None. FINDINGS: IVC venography demonstrates a normal caliber IVC with no evidence of thrombus. Renal veins are identified bilaterally. The IVC filter was successfully positioned below the level of the renal veins and is appropriately oriented. This IVC filter has both permanent and retrievable indications. IMPRESSION: Placement of percutaneous IVC filter in infrarenal IVC. IVC venogram shows no evidence of IVC thrombus and normal caliber of the inferior vena cava. This filter does have both permanent and retrievable indications. PLAN: This IVC filter is potentially retrievable. The patient will be assessed for filter retrieval by Interventional Radiology in approximately 8-12 weeks. Further recommendations regarding filter retrieval, continued surveillance or declaration of device permanence, will be made at that time. Electronically Signed   By: Marcey Moan M.D.   On: 12/12/2023 10:24   VAS US  LOWER EXTREMITY VENOUS (DVT) Result Date: 12/10/2023  Lower Venous DVT Study Patient Name:  Corey [name]  Date of Exam:   12/10/2023 Medical Rec #: [id number]       Accession #:    [id number] Date of Birth: 03/05/1966      Patient Gender: M Patient Age:   60 years Exam Location:  [facility] Procedure:      VAS US  LOWER EXTREMITY VENOUS (DVT) Referring Phys: TORIBIO PITCH --------------------------------------------------------------------------------  Other Indications: Severe progressive thoracic myelopathy, status post T1-T4                    posterior spinal instrumentation and Fusion, T3                    Transpedicular decompression, additional laminectomies                    12/07/2023 at [facility]. Patient newly admitted to  rehabilitation. Risk Factors: Immobility. Comparison Study: No prior study on file Performing Technologist: Alberta Lis RVS  Examination Guidelines: A complete evaluation includes B-mode imaging, spectral Doppler, color Doppler, and power Doppler as needed of all accessible portions of each vessel. Bilateral testing is considered an integral part of a complete examination. Limited examinations for reoccurring indications may be performed as noted. The reflux portion of the exam is performed with the patient in reverse Trendelenburg.  +---------+---------------+---------+-----------+----------+-------------------+ RIGHT    CompressibilityPhasicitySpontaneityPropertiesThrombus Aging      +---------+---------------+---------+-----------+----------+-------------------+ CFV      Full           Yes      Yes                                      +---------+---------------+---------+-----------+----------+-------------------+ SFJ      Full                                                             +---------+---------------+---------+-----------+----------+-------------------+  FV Prox  Full           Yes      Yes                                      +---------+---------------+---------+-----------+----------+-------------------+ FV Mid   Full                                                             +---------+---------------+---------+-----------+----------+-------------------+ FV DistalFull                                                             +---------+---------------+---------+-----------+----------+-------------------+ PFV      Full           Yes      Yes                                      +---------+---------------+---------+-----------+----------+-------------------+ POP      Full           Yes      Yes                                      +---------+---------------+---------+-----------+----------+-------------------+ PTV      None                                          Acute proximal calf +---------+---------------+---------+-----------+----------+-------------------+ PERO     None                                         Acute mid calf      +---------+---------------+---------+-----------+----------+-------------------+ Gastroc  None           No       No                   Acute               +---------+---------------+---------+-----------+----------+-------------------+   +---------+---------------+---------+-----------+----------+--------------+ LEFT     CompressibilityPhasicitySpontaneityPropertiesThrombus Aging +---------+---------------+---------+-----------+----------+--------------+ CFV      Full           Yes      Yes                                 +---------+---------------+---------+-----------+----------+--------------+ SFJ      Full                                                        +---------+---------------+---------+-----------+----------+--------------+  FV Prox  Full                                                        +---------+---------------+---------+-----------+----------+--------------+ FV Mid   Full                                                        +---------+---------------+---------+-----------+----------+--------------+ FV DistalFull                                                        +---------+---------------+---------+-----------+----------+--------------+ PFV      Full                                                        +---------+---------------+---------+-----------+----------+--------------+ POP      Full           Yes      Yes                                 +---------+---------------+---------+-----------+----------+--------------+ PTV      None                                         Acute distally +---------+---------------+---------+-----------+----------+--------------+ PERO     None                                          Acute distally +---------+---------------+---------+-----------+----------+--------------+ Gastroc  Full                                                        +---------+---------------+---------+-----------+----------+--------------+     Summary: RIGHT: - Findings consistent with acute deep vein thrombosis involving the right proximal posterior tibial veins, and the right mid peroneal veins. Findings consistent with acute intramuscular thrombosis involving the right gastrocnemius veins. - No cystic structure found in the popliteal fossa.  LEFT: - Findings consistent with acute deep vein thrombosis involving the distal left posterior tibial veins, and left peroneal veins.  - No cystic structure found in the popliteal fossa.  *See table(s) above for measurements and observations. Electronically signed by Gaile New MD on 12/10/2023 at 5:17:35 PM.    Final    DG Thoracic Spine 2 View Result Date: 12/07/2023 CLINICAL DATA:  Elective surgery EXAM: THORACIC SPINE 2 VIEWS COMPARISON:  CT 12/06/2023 FINDINGS: Multiple low resolution intraoperative spot views of the thoracic spine, in addition to limited CT  views in the axial plane were obtained. Total fluoroscopic dose of 236.32 mGy. Images were obtained during posterior decompression surgery and placement of spinal hardware IMPRESSION: Intraoperative fluoroscopic assistance provided during thoracic spine surgery. Electronically Signed   By: Luke Bun M.D.   On: 12/07/2023 20:19   DG C-Arm 1-60 Min-No Report Result Date: 12/07/2023 Fluoroscopy was utilized by the requesting physician.  No radiographic interpretation.   DG C-Arm 1-60 Min-No Report Result Date: 12/07/2023 Fluoroscopy was utilized by the requesting physician.  No radiographic interpretation.   DG C-Arm 1-60 Min-No Report Result Date: 12/07/2023 Fluoroscopy was utilized by the requesting physician.  No radiographic interpretation.   CT Thoracic Spine Wo Contrast Result  Date: 12/06/2023 CLINICAL DATA:  Rapidly progressive myelopathy and weakness, neurosurgery planning for OR tomorrow EXAM: CT THORACIC SPINE WITHOUT CONTRAST TECHNIQUE: Multidetector CT images of the thoracic were obtained using the standard protocol without intravenous contrast. RADIATION DOSE REDUCTION: This exam was performed according to the departmental dose-optimization program which includes automated exposure control, adjustment of the mA and/or kV according to patient size and/or use of iterative reconstruction technique. COMPARISON:  MRI of the thoracic spine dated November 25, 2023. FINDINGS: Alignment: Normal. Vertebrae: No fracture or osseous lesion. Paraspinal and other soft tissues: Mild dependent atelectasis within the visualized lungs. Disc levels: T1-2: Left posterolateral disc osteophyte causing moderate left spinal canal and neural foraminal stenosis. T2-3: Bilateral posterolateral endplate spurring common causing moderate central spinal canal stenosis and bilateral neural foraminal stenosis. T3-4: Bilateral uncovertebral joint hypertrophy with mild-to-moderate bilateral neural foraminal stenosis. T4-5: Mild bilateral uncovertebral joint hypertrophy with mild bilateral neural foraminal stenosis. T5-6: Negative. T6-7: Negative. T7-8: Negative. T8-9: Negative. T9-10: Negative. T10-11: Mild mild disc space narrowing. No apparent significant spinal canal or neural foraminal stenosis. T11-12: Mild disc space narrowing. No apparent significant spinal canal or neural foraminal stenosis. IMPRESSION: 1. Degenerative spurring at T2-3, resulting in moderate central spinal canal stenosis and bilateral neural foraminal stenosis. 2. Left posterolateral bulging disc osteophyte complex at T1-2, causing moderate left-sided spinal canal and neural foraminal stenosis. Electronically Signed   By: Evalene Coho M.D.   On: 12/06/2023 19:47   MR Thoracic Spine Wo Contrast Addendum Date: 11/25/2023 ADDENDUM REPORT:  11/25/2023 22:57 ADDENDUM: Critical Value/emergent results were called by telephone at the time of interpretation on 11/25/2023 at 8:20 p.m. to provider NORLEEN JOBS , who verbally acknowledged these results. Electronically Signed   By: Morene Hoard M.D.   On: 11/25/2023 22:57   Result Date: 11/25/2023 CLINICAL DATA:  Initial evaluation for acute myelopathy. EXAM: MRI THORACIC SPINE WITHOUT CONTRAST TECHNIQUE: Multiplanar, multisequence MR imaging of the thoracic spine was performed. No intravenous contrast was administered. COMPARISON:  None Available. FINDINGS: Alignment: Trace scoliosis. Alignment otherwise normal preservation of the normal thoracic kyphosis. No listhesis. Vertebrae: Vertebral body height maintained without acute or chronic fracture. Bone marrow signal intensity overall within normal limits. No worrisome osseous lesions. No abnormal marrow edema. Cord: Patchy cord signal abnormality seen at the level of T2-3, concerning for edema and/or myelomalacia (series 106, image 10). Otherwise normal signal and morphology. Paraspinal and other soft tissues: Unremarkable. Disc levels: T1-2: Diffuse disc bulge with reactive endplate spurring. Superimposed left paracentral disc protrusion (series 108, image 5). Left-sided facet hypertrophy. Resultant mild spinal stenosis with mild cord flattening, but no cord signal changes. Foramina appear patent. T2-3: Diffuse disc bulge with reactive endplate spurring. Moderate bilateral facet hypertrophy. Resultant severe spinal stenosis. Secondary cord flattening/compression with associated cord signal changes (series 108,  image 9). Moderate bilateral foraminal narrowing. T3-4: Diffuse disc bulge with reactive endplate spurring. Mild facet hypertrophy. Resultant mild spinal stenosis. Moderate bilateral foraminal narrowing. T4-5: Disc bulge with endplate spurring. Mild facet hypertrophy. No significant spinal stenosis. Mild right foraminal narrowing. Left neural  foramina appears patent. T5-6: Mild disc bulge with endplate spurring. Right-sided facet hypertrophy. No significant stenosis. T6-7: Mild disc bulge, asymmetric to the left. Mild facet hypertrophy. No significant canal or foraminal stenosis. T7-8: Mild disc bulge. Right-sided facet hypertrophy. No significant stenosis. T8-9: Minimal disc bulge. Mild right-sided facet hypertrophy. No significant stenosis. T9-10: Mild diffuse disc bulge superimposed small right paracentral disc protrusion (series 108, image 31). Mild right-sided facet hypertrophy. No significant spinal stenosis. Foramina appear patent. T10-11: Diffuse disc bulge with reactive endplate spurring. Right worse than left facet hypertrophy. No significant spinal stenosis. Moderate right foraminal narrowing. Left neural foramina remains patent. T11-12: Disc bulge with endplate spurring. Mild facet hypertrophy. No significant spinal stenosis. Severe right foraminal narrowing. Left neural foramina remains patent. T12-L1:  Negative interspace.  Mild facet hypertrophy.  No stenosis. IMPRESSION: 1. Multilevel thoracic spondylosis with resultant diffuse spinal stenosis at T1-2 through T3-4, severe at the T2-3 level. Secondary cord flattening/compression at T2-3 with associated cord signal changes, consistent with edema and/or myelomalacia. 2. Multifactorial degenerative changes with resultant multilevel foraminal narrowing as above. Notable findings include moderate bilateral foraminal narrowing at T2-3 and T3-4, with severe right foraminal narrowing at T11-12. Current attempt is being made to contact the ordering clinician. Results will be conveyed as soon as possible. Electronically Signed: By: Morene Hoard M.D. On: 11/25/2023 20:09   XR Lumb Spine Flex&Ext Only Result Date: 11/22/2023 XRs of the lumbar spine from 11/22/2023 were independently reviewed and interpreted, showing posterior instrumentation at L5 and S1. No lucency seen around the screws.   Interbody device in the former L5/S1 disc space that appears appropriate position. Anterior osteophyte formation seen at L4/5.  No fracture or dislocation seen.   Labs:  Basic Metabolic Panel: Recent Labs  Lab 12/13/23 0510  NA 137  K 3.6  CL 102  CO2 27  GLUCOSE 120*  BUN 13  CREATININE 1.23  CALCIUM  8.2*    CBC: Recent Labs  Lab 12/13/23 0510 12/15/23 0428 12/18/23 0548  WBC 8.7  --  6.8  NEUTROABS 5.1  --  4.0  HGB 9.9* 10.3* 10.6*  HCT 30.5* 31.5* 32.9*  MCV 84.7  --  85.2  PLT 233  --  416*    CBG: No results for input(s): GLUCAP in the last 168 hours.  Family history.  Mother with Alzheimer's disease.  Negative for colon cancer esophageal cancer or rectal cancer  Brief HPI:   Corey [name] is a 58 y.o. right-handed male with history of hypertension on no antihypertensive medications hyperlipidemia currently on no statin drugs depression sleep apnea left total knee arthroplasty by Dr. Barbarann 11/11/2020 as well as past surgical microdiscectomy L5-S1.  Per chart review patient lives with spouse.  Independent prior to admission but requiring more physical assistance over the past several weeks with noted falls due to increasing weakness.  Presented 12/06/2023 with progressive sensation loss in bilateral lower extremities that started in his feet progressing up to his mid abdomen.  MRI imaging revealed multilevel thoracic spondylosis with resultant diffuse spinal stenosis at T1-2 through T3-4, severe at the T2-3 level.  Secondary cord flattening cord compression T2-T3 with associated cord signal changes consistent with edema/myelomalacia.  Multifactorial degenerative changes with resultant multilevel foraminal  stenosis.  Notable findings include moderate bilateral foraminal narrowing at T2-3 and T3-4 severe right foraminal narrowing at T11-12.  Underwent T3 transpedicular decompression T1-T4 PSF on 12/07/2023 per neurosurgery Dr. Penne Sharps.  Postoperative drain and wound VAC  placed.  Plan was for drain to be removed 12/11/2022 wound VAC can be removed when loses suction or when battery runs down.  No brace required.  Placed on Lovenox  for DVT prophylaxis.  Therapy evaluations completed due to patient's decreased functional mobility was admitted for a comprehensive rehab program.   Hospital Course: Corey [name] was admitted to rehab 12/10/2023 for inpatient therapies to consist of PT, ST and OT at least three hours five days a week. Past admission physiatrist, therapy team and rehab RN have worked together to provide customized collaborative inpatient rehab.  Pertaining to patient's thoracic myelopathy status post T3 transpedicular decompression, T1-T4 PSF 12/07/2023 per Dr. Penne Sharps of neurosurgery.  No brace required.  Plan was for drain to be removed 12/11/2022 and wound VAC to be removed with loses suction or when battery runs down.  Initially on Lovenox  for DVT prophylaxis venous Doppler studies 12/10/2023 showed right proximal posterior tibial vein, right mid peroneal vein, left posterior tibial vein, left peroneal vein and intramuscular thrombosis with right gastrocnemius vein DVT.  Contact made to neurosurgery and order was to continue Lovenox  at 40 mg daily for approximate 1 more week follow-up Doppler studies for any propagation with contact made to interventional radiology for IVC filter placement per interventional radiology Dr. Luverne.  Patient was later cleared to also begin low-dose Eliquis  5 mg twice daily with repeat venous Doppler study 12/13/2023 showing findings consistent with DVT involving the right gastrocnemius vein.  No evidence of deep vein thrombosis in the left lower extremity at this time.  Pain management with use of Robaxin  as well as oxycodone  and baclofen  for muscle spasms.  Mood stabilization with Paxil  he was attending full therapies emotional support provided.  Bouts of constipation managing with bowel program.  He continued on CPAP for history of  OSA.  He did have a documented history of hypertension on no current antihypertensive medications now or prior to admission.  Postoperative anemia likely postsurgical follow-up CBC.   Blood pressures were monitored on TID basis and controlled monitored      Rehab course: During patient's stay in rehab weekly team conferences were held to monitor patient's progress, set goals and discuss barriers to discharge. At admission, patient required contact-guard assist rolling walker 50 feet minimal assist sit to stand  He/She  has had improvement in activity tolerance, balance, postural control as well as ability to compensate for deficits. He/She has had improvement in functional use RUE/LUE  and RLE/LLE as well as improvement in awareness.  Patient performedx 8 sit to stand marchingx 4 in place.  Ambulates 175 feet x 3 minimal/contact-guard rolling walker.  He was able to ambulate to the therapy gym with contact-guard overall.  Upper body dressing standby assist donning doffing overhead shirt.  Contact-guard lower body dressing.  Contact-guard shower transfers as well as bathing.  Full family teaching completed plan discharge to home.       Disposition:  Discharge disposition: 01-Home or Self Care        Diet: Regular  Special Instructions: No driving smoking or alcohol  Medications at discharge 1.  Tylenol  as needed 2.  Colace 100 mg twice daily 3.  Iron supplement/vitamin C 1 capsule twice daily 4.  Robaxin  500 mg every 6  hours as needed muscle spasms 5.  Oxycodone  5 to 10 mg every 4 hours as needed pain 6.  Paxil  20 mg daily 7.  MiraLAX  daily as needed hold for loose stools 8.  Multivitamin daily 9.  Eliquis  5 mg twice daily 10.  Baclofen  5 mg every 6 hours as needed muscle spasms  30-35 minutes were spent completing discharge summary and discharge planning     Follow-up Information     Lovorn, Megan, MD Follow up.   Specialty: Physical Medicine and Rehabilitation Why:  As advised Contact information: [address] [place] [address] [phone]         Claudene Penne ORN, MD Follow up.   Specialty: Neurosurgery Why: Call for appointment Contact information: [address] [place] [address] [phone]         Luverne Aran, MD Follow up.   Specialties: Interventional Radiology, Radiology Why: Call for appointment Contact information: [address] [place] [address] [place] [address] [phone]                 Signed: Toribio PARAS Evadean Sproule 12/19/2023, 2:12 PM

## 2023-12-12 NOTE — Progress Notes (Signed)
 PROGRESS NOTE   Subjective/Complaints:  Pt reports finally had a BM last night  after 6 days-  Ready for IVC filter this AM- pain is 6-7/10 this AM- manageable but since NPO, cannot really take meds.   Also notes battery died on VAC late last night-  Asking when will be removed.     ROS:  Pt denies SOB, abd pain, CP, N/V/C/D, and vision changes LBM yesterday  Per HPI   Objective:   IR IVC FILTER PLMT / S&I PORTER GUID/MOD SED Result Date: 12/12/2023 CLINICAL DATA:  Bilateral lower extremity DVT and status post thoracic spinal surgery. Need for DVT due to contraindication currently to therapeutic anticoagulation. EXAM: 1. ULTRASOUND GUIDANCE FOR VASCULAR ACCESS OF THE RIGHT INTERNAL JUGULAR VEIN. 2. IVC VENOGRAM. 3. PERCUTANEOUS IVC FILTER PLACEMENT. ANESTHESIA/SEDATION: Moderate (conscious) sedation was employed during this procedure. A total of Versed  2.0 mg and Fentanyl  100 mcg was administered intravenously. Moderate Sedation Time: 15 minutes. The patient's level of consciousness and vital signs were monitored continuously by radiology nursing throughout the procedure under my direct supervision. CONTRAST:  40mL OMNIPAQUE  IOHEXOL  300 MG/ML  SOLN FLUOROSCOPY: Radiation Exposure Index: 107.6 mGy Kerma PROCEDURE: The procedure, risks, benefits, and alternatives were explained to the patient. Questions regarding the procedure were encouraged and answered. The patient understands and consents to the procedure. A time-out was performed prior to initiating the procedure. The right neck was prepped with chlorhexidine  in a sterile fashion, and a sterile drape was applied covering the operative field. A sterile gown and sterile gloves were used for the procedure. Local anesthesia was provided with 1% Lidocaine . Ultrasound was utilized to confirm patency of the right internal jugular vein. An ultrasound image was saved and recorded. Under direct  ultrasound guidance, a 21 gauge needle was advanced into the right internal jugular vein with ultrasound image documentation performed. After securing access with a micropuncture dilator, a guidewire was advanced into the inferior vena cava. A deployment sheath was advanced over the guidewire. This was utilized to perform IVC venography. The deployment sheath was further positioned in an appropriate location for filter deployment. A Bard Denali IVC filter was then advanced in the sheath. This was then fully deployed in the infrarenal IVC. Final filter position was confirmed with a fluoroscopic spot image. After the procedure the sheath was removed and hemostasis obtained with manual compression. COMPLICATIONS: None. FINDINGS: IVC venography demonstrates a normal caliber IVC with no evidence of thrombus. Renal veins are identified bilaterally. The IVC filter was successfully positioned below the level of the renal veins and is appropriately oriented. This IVC filter has both permanent and retrievable indications. IMPRESSION: Placement of percutaneous IVC filter in infrarenal IVC. IVC venogram shows no evidence of IVC thrombus and normal caliber of the inferior vena cava. This filter does have both permanent and retrievable indications. PLAN: This IVC filter is potentially retrievable. The patient will be assessed for filter retrieval by Interventional Radiology in approximately 8-12 weeks. Further recommendations regarding filter retrieval, continued surveillance or declaration of device permanence, will be made at that time. Electronically Signed   By: Marcey Moan M.D.   On: 12/12/2023 10:24   VAS US  LOWER EXTREMITY  VENOUS (DVT) Result Date: 12/10/2023  Lower Venous DVT Study Patient Name:  Corey [name]  Date of Exam:   12/10/2023 Medical Rec #: [id number]       Accession #:    [id number] Date of Birth: [birth year]      Patient Gender: M Patient Age:   58 years Exam Location:  [facility] Procedure:       VAS US  LOWER EXTREMITY VENOUS (DVT) Referring Phys: TORIBIO PITCH --------------------------------------------------------------------------------  Other Indications: Severe progressive thoracic myelopathy, status post T1-T4                    posterior spinal instrumentation and Fusion, T3                    Transpedicular decompression, additional laminectomies                    12/07/2023 at [facility]. Patient newly admitted to rehabilitation. Risk Factors: Immobility. Comparison Study: No prior study on file Performing Technologist: Alberta Lis RVS  Examination Guidelines: A complete evaluation includes B-mode imaging, spectral Doppler, color Doppler, and power Doppler as needed of all accessible portions of each vessel. Bilateral testing is considered an integral part of a complete examination. Limited examinations for reoccurring indications may be performed as noted. The reflux portion of the exam is performed with the patient in reverse Trendelenburg.  +---------+---------------+---------+-----------+----------+-------------------+ RIGHT    CompressibilityPhasicitySpontaneityPropertiesThrombus Aging      +---------+---------------+---------+-----------+----------+-------------------+ CFV      Full           Yes      Yes                                      +---------+---------------+---------+-----------+----------+-------------------+ SFJ      Full                                                             +---------+---------------+---------+-----------+----------+-------------------+ FV Prox  Full           Yes      Yes                                      +---------+---------------+---------+-----------+----------+-------------------+ FV Mid   Full                                                             +---------+---------------+---------+-----------+----------+-------------------+ FV DistalFull                                                              +---------+---------------+---------+-----------+----------+-------------------+ PFV      Full           Yes      Yes                                      +---------+---------------+---------+-----------+----------+-------------------+  POP      Full           Yes      Yes                                      +---------+---------------+---------+-----------+----------+-------------------+ PTV      None                                         Acute proximal calf +---------+---------------+---------+-----------+----------+-------------------+ PERO     None                                         Acute mid calf      +---------+---------------+---------+-----------+----------+-------------------+ Gastroc  None           No       No                   Acute               +---------+---------------+---------+-----------+----------+-------------------+   +---------+---------------+---------+-----------+----------+--------------+ LEFT     CompressibilityPhasicitySpontaneityPropertiesThrombus Aging +---------+---------------+---------+-----------+----------+--------------+ CFV      Full           Yes      Yes                                 +---------+---------------+---------+-----------+----------+--------------+ SFJ      Full                                                        +---------+---------------+---------+-----------+----------+--------------+ FV Prox  Full                                                        +---------+---------------+---------+-----------+----------+--------------+ FV Mid   Full                                                        +---------+---------------+---------+-----------+----------+--------------+ FV DistalFull                                                        +---------+---------------+---------+-----------+----------+--------------+ PFV      Full                                                         +---------+---------------+---------+-----------+----------+--------------+ POP  Full           Yes      Yes                                 +---------+---------------+---------+-----------+----------+--------------+ PTV      None                                         Acute distally +---------+---------------+---------+-----------+----------+--------------+ PERO     None                                         Acute distally +---------+---------------+---------+-----------+----------+--------------+ Gastroc  Full                                                        +---------+---------------+---------+-----------+----------+--------------+     Summary: RIGHT: - Findings consistent with acute deep vein thrombosis involving the right proximal posterior tibial veins, and the right mid peroneal veins. Findings consistent with acute intramuscular thrombosis involving the right gastrocnemius veins. - No cystic structure found in the popliteal fossa.  LEFT: - Findings consistent with acute deep vein thrombosis involving the distal left posterior tibial veins, and left peroneal veins.  - No cystic structure found in the popliteal fossa.  *See table(s) above for measurements and observations. Electronically signed by Gaile New MD on 12/10/2023 at 5:17:35 PM.    Final    Recent Labs    12/12/23 0710  WBC 8.8  HGB 10.3*  HCT 32.2*  PLT 235   Recent Labs    12/12/23 0710  NA 139  K 3.8  CL 103  CO2 27  GLUCOSE 114*  BUN 12  CREATININE 1.12  CALCIUM  8.6*    Intake/Output Summary (Last 24 hours) at 12/12/2023 1027 Last data filed at 12/12/2023 0735 Gross per 24 hour  Intake 476 ml  Output 1000 ml  Net -524 ml        Physical Exam: Vital Signs Blood pressure 133/80, pulse 88, temperature 98.2 F (36.8 C), temperature source Oral, resp. rate 16, SpO2 98%.     General: awake, alert, appropriate, up in w/c going to  OT; with OT; NAD HENT: conjugate  gaze; oropharynx moist CV: regular rate and rhythm; no JVD Pulmonary: CTA B/L; no W/R/R- good air movement GI: soft, NT, ND, (+)BS, normoactive Psychiatric: appropriate, interactive Neurological: Ox3  Skin: C/D/I. No apparent lesions.              - Accordion drain with minimal gross bloody output              - Wound vac, well-sealed, no active drainage- looks good- small VAC in place   - VAC stopped working- no light on it MSK:      No apparent deformity. + L knee TKR scar. R knee extension slightly limited 0-5 degrees.       Neurologic exam:  Cognition: AAO to person, place, time and event.  Language: Fluent, No substitutions or neoglisms. No dysarthria. Names 3/3 objects correctly.  Memory: Recalls 3/3 objects at 5 minutes. No  apparent deficits  Insight: Good  insight into current condition.  Mood: Pleasant affect, appropriate mood.  Sensation: To light touch intact in BL UEs and Les; notes sensory decrease in all bilateral toes Reflexes: 2+ in BL UE and LEs. Negative Hoffman's and babinski signs bilaterally.  CN: 2-12 grossly intact.  Coordination: No apparent tremors. + LLE ataxia with HTS; otherwise intact Spasticity: MAS 0 in all extremities.       Strength:                RUE: 5/5 SA, 5/5 EF, 5/5 EE, 5/5 WE, 5/5 FF, 5/5 FA                LUE:  5/5 SA, 5/5 EF, 5/5 EE, 5/5 WE, 5/5 FF, 5/5 FA                RLE: 4/5 HF, 5/5 KE, 5/5  DF, 5/5  EHL, 5/5  PF                 LLE:  4-/5 HF, 4-/5 KE, 5/5  DF, 5/5  EHL, 5/5  PF     Assessment/Plan: 1. Functional deficits which require 3+ hours per day of interdisciplinary therapy in a comprehensive inpatient rehab setting. Physiatrist is providing close team supervision and 24 hour management of active medical problems listed below. Physiatrist and rehab team continue to assess barriers to discharge/monitor patient progress toward functional and medical goals  Care Tool:  Bathing    Body parts bathed by patient: Right arm, Left  arm, Chest, Abdomen, Front perineal area, Buttocks, Right upper leg, Left upper leg, Face   Body parts bathed by helper: Right lower leg, Left lower leg     Bathing assist Assist Level: Minimal Assistance - Patient > 75%     Upper Body Dressing/Undressing Upper body dressing   What is the patient wearing?: Pull over shirt    Upper body assist Assist Level: Set up assist    Lower Body Dressing/Undressing Lower body dressing      What is the patient wearing?: Pants     Lower body assist Assist for lower body dressing: Contact Guard/Touching assist     Toileting Toileting    Toileting assist Assist for toileting: Minimal Assistance - Patient > 75%     Transfers Chair/bed transfer  Transfers assist     Chair/bed transfer assist level: Minimal Assistance - Patient > 75%     Locomotion Ambulation   Ambulation assist      Assist level: Minimal Assistance - Patient > 75% Assistive device: Walker-rolling Max distance: 169ft   Walk 10 feet activity   Assist     Assist level: Minimal Assistance - Patient > 75% Assistive device: Walker-rolling   Walk 50 feet activity   Assist    Assist level: Minimal Assistance - Patient > 75% Assistive device: Walker-rolling    Walk 150 feet activity   Assist    Assist level: Minimal Assistance - Patient > 75% Assistive device: Walker-rolling    Walk 10 feet on uneven surface  activity   Assist Walk 10 feet on uneven surfaces activity did not occur: Safety/medical concerns         Wheelchair     Assist Is the patient using a wheelchair?: Yes Type of Wheelchair: Manual    Wheelchair assist level: Supervision/Verbal cueing Max wheelchair distance: 163ft    Wheelchair 50 feet with 2 turns activity    Assist  Assist Level: Supervision/Verbal cueing   Wheelchair 150 feet activity     Assist      Assist Level: Supervision/Verbal cueing   Blood pressure 133/80, pulse 88,  temperature 98.2 F (36.8 C), temperature source Oral, resp. rate 16, SpO2 98%.   Medical Problem List and Plan: 1. Functional deficits secondary to thoracic myelopathy.  Status post T3 transpedicular decompression, T1-T4 PSF 12/07/2023 per Dr. Penne Sharps neurosurgery.NO BRACE REQUIRED.  As per neurosurgery postoperative drain to be removed 12/11/2022.  Wound VAC can be removed when loses suction or when the battery runs down.             -patient may not shower until vac removed             -ELOS/Goals: 10-12 days, Mod I PT/OT           Con't CIR PT and OT- missed most of therapy today due to IVC filter placement and 2 hours of bedrest afterwards.   IPOC done 2.  Antithrombotics: -DVT/anticoagulation:  Pharmaceutical: Lovenox .  Check vascular study             -antiplatelet therapy: N/A              - 7/11: Admission duplex + DVT R proximal posterior tibial veins, R mid-peroneal veins, L posterior tibial veins, L peroneal veins, and intramuscular thrombosis on R gastrocnemius veins. Rolan Haggard d/w Neurosurgery Dr. Theressa office, unable to start full dose AC until 7d post-op. Recommend continuation of PPX dose lovenox  40 mg daily with repeat Duplex Monday to ensure no extension.              -- Later, messaged by Dr. Sharps requesting additional consult for IVC filter; order placed, d/w [facility] PA for follow-up in AM with IR for eval   7/12- spoke with IR_ will do IVC filter tomorrow- and will make him NPO- went back and spoke to pt about plan  7/13- pt got IVC filter this AM- restarted a diet 3. Pain Management: Robaxin  and oxycodone  as needed 4. Mood/Behavior/Sleep: Paxil  20 mg daily             -antipsychotic agents:  5. Neuropsych/cognition: This patient is capable of making decisions on his own behalf. 6. Skin/Wound Care: Routine skin checks              - Per NSGY, HV drain can be removed in AM 7/12; order placed              - Wound vac to be removed once suction is  lost/batteries run out   7/12- will have to remove VAC and put back on, because drain under VAC material  7/13- VAC stopped working, so will remove VAC and drain today after 2 hours of bedrest after IVC filter. Also ordered dry dressing to be changed daily and prn if soiled 7. Fluids/Electrolytes/Nutrition: Routine in and outs with follow-up chemistries 8.  Constipation.  Colace 100 mg twice daily, MiraLAX  daily as needed             - LBM Mon/Tues per patient 9.  OSA.  CPAP 10.  Hypertension.  Currently on no antihypertensive medications or prior to admission.  Monitor with increased mobility  7/12- 7/13- BP doing better- will con't to monitor with activity  11. Anemia. 14->12->10 hgb, likely post-surgical. Monitor labs. Iron-Vitamin C -B12-FA supplement.     7/13- Hb 10.3- is stable  I spent a total of 53  minutes on total care today- >50% coordination of care- due to  Removed drain after VAC removed- placed dry dressing- ordered all- also d/w pt about IVC filter  LOS: 2 days A FACE TO FACE EVALUATION WAS PERFORMED  Gianno Volner 12/12/2023, 10:27 AM

## 2023-12-12 NOTE — Procedures (Signed)
 Interventional Radiology Procedure Note  Procedure: IVC filter placement  Complications: None  Estimated Blood Loss: < 10 mL  Findings: IVC normally patent. Bard [place] retrievable IVC filter placed in infrarenal IVC via right internal jugular vein access.  Marcey DASEN. Luverne, M.D Pager:  [phone]

## 2023-12-12 NOTE — Progress Notes (Signed)
 Occupational Therapy Session Note  Patient Details  Name: Corey [name] MRN: [id number] Date of Birth: 07-23-1965  Today's Date: 12/12/2023 OT Individual Time: 0700-0810 OT Individual Time Calculation (min): 70 min    Short Term Goals: Week 1:  OT Short Term Goal 1 (Week 1): patient will complete toileting act with sup A OT Short Term Goal 2 (Week 1): patient will complete bathing at sink or shower CGA with AE and min verbal cues OT Short Term Goal 3 (Week 1): patient will complete UB/ LB dressing SUP with AE  Skilled Therapeutic Interventions/Progress Updates:    Pt resting in bed upon arrival and agreeable to therapy. Skilled OT intervention with focus on bed mobility, sit<>stand, standing balance, BADLs, functional transfers/amb with RW, shower transfers, and safety awareness to increase independence with BADLs. Pt recalled and adhered to spinal precautions throughout the session. Pt used AE for LB dressing tasks. Supine>sit EOB with supervision using bed rails. All transfers and short amb with RW at min A fading to CGA. Bathing with min A for LB (feet) without AE. Dressing with min A (assist for Ted hose). Pt practiced simulatedshower transfers with CGA. Drain and Wound Vac in place throughout session. Pt returned to room. Pt transferred to recliner. All needs within reach.   Therapy Documentation Precautions:  Precautions Precautions: Fall, Back Precaution Booklet Issued: Yes (comment) Recall of Precautions/Restrictions: Intact Precaution/Restrictions Comments: spinal precautions, no brace needed per orders Restrictions Weight Bearing Restrictions Per Provider Order: No Pain:  Pt c/o 6/10 back pain; MD and RN aware (pt awaiting procedure and NPO), pt declined pain meds   Therapy/Group: Individual Therapy  Maritza Debby Mare 12/12/2023, 8:13 AM

## 2023-12-13 ENCOUNTER — Inpatient Hospital Stay (HOSPITAL_COMMUNITY)

## 2023-12-13 DIAGNOSIS — I10 Essential (primary) hypertension: Secondary | ICD-10-CM | POA: Diagnosis not present

## 2023-12-13 DIAGNOSIS — I803 Phlebitis and thrombophlebitis of lower extremities, unspecified: Secondary | ICD-10-CM

## 2023-12-13 DIAGNOSIS — D62 Acute posthemorrhagic anemia: Secondary | ICD-10-CM | POA: Diagnosis not present

## 2023-12-13 DIAGNOSIS — M4714 Other spondylosis with myelopathy, thoracic region: Secondary | ICD-10-CM | POA: Diagnosis not present

## 2023-12-13 DIAGNOSIS — T148XXA Other injury of unspecified body region, initial encounter: Secondary | ICD-10-CM

## 2023-12-13 LAB — CBC WITH DIFFERENTIAL/PLATELET
Abs Immature Granulocytes: 0.04 K/uL (ref 0.00–0.07)
Basophils Absolute: 0 K/uL (ref 0.0–0.1)
Basophils Relative: 1 %
Eosinophils Absolute: 0.2 K/uL (ref 0.0–0.5)
Eosinophils Relative: 2 %
HCT: 30.5 % — ABNORMAL LOW (ref 39.0–52.0)
Hemoglobin: 9.9 g/dL — ABNORMAL LOW (ref 13.0–17.0)
Immature Granulocytes: 1 %
Lymphocytes Relative: 28 %
Lymphs Abs: 2.4 K/uL (ref 0.7–4.0)
MCH: 27.5 pg (ref 26.0–34.0)
MCHC: 32.5 g/dL (ref 30.0–36.0)
MCV: 84.7 fL (ref 80.0–100.0)
Monocytes Absolute: 0.9 K/uL (ref 0.1–1.0)
Monocytes Relative: 11 %
Neutro Abs: 5.1 K/uL (ref 1.7–7.7)
Neutrophils Relative %: 57 %
Platelets: 233 K/uL (ref 150–400)
RBC: 3.6 MIL/uL — ABNORMAL LOW (ref 4.22–5.81)
RDW: 14.1 % (ref 11.5–15.5)
WBC: 8.7 K/uL (ref 4.0–10.5)
nRBC: 0 % (ref 0.0–0.2)

## 2023-12-13 LAB — COMPREHENSIVE METABOLIC PANEL WITH GFR
ALT: 25 U/L (ref 0–44)
AST: 19 U/L (ref 15–41)
Albumin: 2.5 g/dL — ABNORMAL LOW (ref 3.5–5.0)
Alkaline Phosphatase: 43 U/L (ref 38–126)
Anion gap: 8 (ref 5–15)
BUN: 13 mg/dL (ref 6–20)
CO2: 27 mmol/L (ref 22–32)
Calcium: 8.2 mg/dL — ABNORMAL LOW (ref 8.9–10.3)
Chloride: 102 mmol/L (ref 98–111)
Creatinine, Ser: 1.23 mg/dL (ref 0.61–1.24)
GFR, Estimated: >60 mL/min (ref >60)
Glucose, Bld: 120 mg/dL — ABNORMAL HIGH (ref 70–99)
Potassium: 3.6 mmol/L (ref 3.5–5.1)
Sodium: 137 mmol/L (ref 135–145)
Total Bilirubin: 0.6 mg/dL (ref 0.0–1.2)
Total Protein: 5.9 g/dL — ABNORMAL LOW (ref 6.5–8.1)

## 2023-12-13 NOTE — Progress Notes (Signed)
 Physical Therapy Session Note  Patient Details  Name: Corey [name] MRN: [id number] Date of Birth: 1965/07/17  Today's Date: 12/13/2023 PT Individual Time: [phone] PT Individual Time Calculation (min): 68 min, 60 min   Short Term Goals: Week 1:  PT Short Term Goal 1 (Week 1): STG = LTG due to ELOS  Skilled Therapeutic Interventions/Progress Updates:    Session 1: pt received in bed and agreeable to therapy. Pt reports 4/10 pain, premedicated. Rest and positioning provided as needed. Bed mobility with supervision and incr time with bed features. ambulatory transfer with min a and RW to w/c, Pt propelled w/c with BUE for endurance and functional mobility.    Pt performed x 8 Sit to stand +marching x 4 for warm up to improve single leg stance stability and stiffness.  Gait 3 x 170 ft with min-CGA with RW, cues for upright posture, increased knee flexion, and self-monitoring fatigue signals. Note decreased knee flexion related to prior TKR, ROM limitations cause internal circumduction and small hip hike. Pt states he has kept up with stretching activities after TKR, therapist recommended continue as able while in hospital. Therapist also provided scar mobilization, with small improvement noted. Demoed how pt could perform independently.  Pt propelled w/c with BLE for hamstring strength and AROM for BLE strength. Pt remained sitting up in w/c, was left with all needs in reach and alarm active.   Session 2: Pt seated in w/c on arrival and agreeable to therapy. No complaint of pain. Pt ambulated to therapy gym with CGA overall, noted increase in stiffness after sitting between sessions, with incr LLE hip/knee stiffness affecting gait pattern. Pt used nustep for BLE strength and ROM x 20 min on rolling hills profile, load interval 6-14. Avg spm=59. Pt then performed 2 x 10 BIL step up + CL LE to 2nd step with BUE support on hand rails and knee block for safety. Performed to fatigue at 7  reps on RLE final set d/t knee buckling. Pt with difficulty lifting LLE off step but able to perform with effort. Pt performed 2 x 10 squats with RW, with emphasis on controlled eccentric motion. Discussed importance of rest breaks for muscle recovery and adequate rest after therapy sessions. Pt returned to room and remained in w/c, was left with all needs in reach and alarm active.   Therapy Documentation Precautions:  Precautions Precautions: Fall, Back Precaution Booklet Issued: Yes (comment) Recall of Precautions/Restrictions: Intact Precaution/Restrictions Comments: spinal precautions, no brace needed per orders Restrictions Weight Bearing Restrictions Per Provider Order: No General:     Therapy/Group: Individual Therapy  [name] [name] 12/13/2023, 8:38 AM

## 2023-12-13 NOTE — Progress Notes (Signed)
 Occupational Therapy Session Note  Patient Details  Name: Corey [name] MRN: [id number] Date of Birth: July 04, 1965  Today's Date: 12/13/2023 OT Individual Time: 1000-1114 OT Individual Time Calculation (min): 74 min    Short Term Goals: Week 1:  OT Short Term Goal 1 (Week 1): patient will complete toileting act with sup A OT Short Term Goal 2 (Week 1): patient will complete bathing at sink or shower CGA with AE and min verbal cues OT Short Term Goal 3 (Week 1): patient will complete UB/ LB dressing SUP with AE  Skilled Therapeutic Interventions/Progress Updates:      Therapy Documentation Precautions:  Precautions Precautions: Fall, Back Precaution Booklet Issued: Yes (comment) Recall of Precautions/Restrictions: Intact Precaution/Restrictions Comments: spinal precautions, no brace needed per orders Restrictions Weight Bearing Restrictions Per Provider Order: No General: Pt seated in W/C upon OT arrival, agreeable to OT. Wound vac removed yesterday 7/13, pt requesting full shower. OT discussing with nsg about wound for bathing, discussed bathing with no dressing then can recover after bathing. OT educated pt not to scrub area or use scented soaps on area.   Pain: no pain reported  ADL: OT providing skilled intervention on ADL retraining in order to increase independence with tasks and increase activity tolerance. Pt completed the following tasks at the current level of assist: Grooming/oral hygiene: UB dressing: SBA donning/doffing overhead shirt LB dressing: CGA, use of reacher to don over feet, standing at RW to manage over waist Footwear: total A for TEDs and shoes while seated in W/C Shower transfer: CGA with increased time with RW ambulating from W/C><TTB with no LOB Bathing: OT issuing long handle sponge for increased independence with bathing tasks. Pt bathing seated on TTB at SBA with long handle sponge and removable shower head.   Pt seated in W/C at end of session  with W/C alarm donned, call light within reach and 4Ps assessed.    Therapy/Group: Individual Therapy  Camie Hoe, OTD, OTR/L 12/13/2023, 7:56 AM

## 2023-12-13 NOTE — Care Management (Signed)
 Inpatient Rehabilitation Center Individual Statement of Services  Patient Name:  Corey [name]  Date:  12/13/2023  Welcome to the Inpatient Rehabilitation Center.  Our goal is to provide you with an individualized program based on your diagnosis and situation, designed to meet your specific needs.  With this comprehensive rehabilitation program, you will be expected to participate in at least 3 hours of rehabilitation therapies Monday-Friday, with modified therapy programming on the weekends.  Your rehabilitation program will include the following services:  Physical Therapy (PT), Occupational Therapy (OT), 24 hour per day rehabilitation nursing, Therapeutic Recreaction (TR), Psychology, Neuropsychology, Care Coordinator, Rehabilitation Medicine, Nutrition Services, Pharmacy Services, and Other  Weekly team conferences will be held on Tuesdays to discuss your progress.  Your Inpatient Rehabilitation Care Coordinator will talk with you frequently to get your input and to update you on team discussions.  Team conferences with you and your family in attendance may also be held.  Expected length of stay: 7-12 days  Overall anticipated outcome: Supervision   Depending on your progress and recovery, your program may change. Your Inpatient Rehabilitation Care Coordinator will coordinate services and will keep you informed of any changes. Your Inpatient Rehabilitation Care Coordinator's name and contact numbers are listed  below.  The following services may also be recommended but are not provided by the Inpatient Rehabilitation Center:  Driving Evaluations Home Health Rehabiltiation Services Outpatient Rehabilitation Services Vocational Rehabilitation   Arrangements will be made to provide these services after discharge if needed.  Arrangements include referral to agencies that provide these services.  Your insurance has been verified to be:  BCBS  Your primary doctor is:  Norleen Jobs  Pertinent information will be shared with your doctor and your insurance company.  Inpatient Rehabilitation Care Coordinator:  Graeme Feliciana SILK [phone] or [phone]  Information discussed with and copy given to patient by: Graeme DELENA Feliciana, 12/13/2023, 12:46 PM

## 2023-12-13 NOTE — Progress Notes (Signed)
 BLE venous exam is completed. Teffany Blaszczyk, RVT

## 2023-12-13 NOTE — Progress Notes (Signed)
 Patient ID: Corey [name], male   DOB: 02-Jul-1965, 58 y.o.   MRN: [id number]  1234- SW left message for pt wife to introduce self, explain role, discuss discharge process and inform on ELOS. SW requested return phone call.   1236- SW spoke with pt wife. She reports she has FMLA she intends to take when he is released from the hospital. Fam edu scheduled for Friday 10am-12pm.   Graeme Jude, MSW, LCSW Office: [phone] Cell: [phone] Fax: [phone]

## 2023-12-13 NOTE — Progress Notes (Signed)
 Inpatient Rehabilitation  Patient information reviewed and entered into eRehab system by [facility]. Karen Kays., CCC/SLP, PPS Coordinator.  Information including medical coding, functional ability and quality indicators will be reviewed and updated through discharge.

## 2023-12-13 NOTE — Progress Notes (Signed)
 Inpatient Rehabilitation Care Coordinator Assessment and Plan Patient Details  Name: Corey [name] MRN: [id number] Date of Birth: February 08, 1966  Today's Date: 12/13/2023  Hospital Problems: Principal Problem:   Thoracic myelopathy  Past Medical History:  Past Medical History:  Diagnosis Date   Arthritis    knee, left   ED (erectile dysfunction)    Hyperlipidemia    Hypertension    Sleep apnea    CPAP - not using lately due to recall    Past Surgical History:  Past Surgical History:  Procedure Laterality Date   ANKLE BONE SURGERY     APPLICATION OF INTRAOPERATIVE CT SCAN  12/07/2023   Procedure: APPLICATION OF INTRAOPERATIVE CT SCAN;  Surgeon: Claudene Penne ORN, MD;  Location: [facility] ORS;  Service: Neurosurgery;;   BACK SURGERY     COLLAR BONE SURGEY     COLONOSCOPY     FRACTURE SURGERY     HERNIA REPAIR     IR IVC FILTER PLMT / S&I /IMG GUID/MOD SED  12/12/2023   KNEE ARTHROSCOPY Left 2006   LAPAROSCOPIC APPENDECTOMY N/A 01/13/2021   Procedure: APPENDECTOMY LAPAROSCOPIC WITH LYSIS OF ADHESIONS;  Surgeon: Sheldon Standing, MD;  Location: [facility] ORS;  Service: General;  Laterality: N/A;   LUMBAR MICRODISCECTOMY     POLYPECTOMY     SKIN BIOPSY Left 08/11/2021   nodulocystic fat nercrosis   TOTAL KNEE ARTHROPLASTY Left 11/11/2020   Procedure: LEFT TOTAL KNEE ARTHROPLASTY;  Surgeon: Barbarann Oneil BROCKS, MD;  Location: [facility] OR;  Service: Orthopedics;  Laterality: Left;   UMBILICAL HERNIA REPAIR     Social History:  reports that he has never smoked. He has never used smokeless tobacco. He reports current alcohol use of about 3.0 standard drinks of alcohol per week. He reports that he does not currently use drugs after having used the following drugs: Marijuana.  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 Education: high school grad Health Literacy - How often do you need to have someone help you when you read instructions, pamphlets, or other written material from your doctor or pharmacy?: Never Writes: Yes Employment Status: Employed Name of Employer: Software engineer Return to Work Plans: TBD Marine scientist Issues: Denies Guardian/Conservator: Denies   Abuse/Neglect Abuse/Neglect Assessment Can Be Completed: Yes Physical Abuse: Denies Verbal Abuse: Denies Sexual Abuse: Denies Exploitation of patient/patient's resources: Denies Self-Neglect: Denies  Patient response to: Social Isolation - How often do you feel lonely or isolated from those around you?: Never  Emotional Status Pt's affect, behavior and adjustment status: Pt in good spirits at time of visit Recent Psychosocial Issues: Denies Psychiatric History: Denies Substance Abuse History: Pt reports occassiaonl glass of wine; denies tobacco products and rec drug use  Patient / Family Perceptions, Expectations & Goals Pt/Family understanding of illness & functional limitations: Pt and family have a general understanding of care needs Premorbid pt/family roles/activities: INdependent Anticipated changes in roles/activities/participation: Likely supervision Pt/family expectations/goals: Pt goal is to work on getting strength back; strength back in legs and back; becoming more independent  [employer]:  None Premorbid Home Care/DME Agencies: None Transportation available at discharge: Wife Is the patient able to respond to transportation needs?: Yes In  the past 12 months, has lack of transportation kept you from medical appointments or from getting medications?: No In the past 12 months, has lack of transportation kept you from meetings, work, or from getting things needed for daily living?: No Resource referrals recommended: Neuropsychology  Discharge Planning Living Arrangements: Spouse/significant other Support Systems: Spouse/significant other Type of Residence: Private residence Insurance Resources: Media planner (specify) Herbalist) Financial Resources: Employment Surveyor, quantity Screen Referred: No Living Expenses: Banker Management: Patient Does the patient have any problems obtaining your medications?: No Home Management: Both help manage home care needs Patient/Family Preliminary Plans: TBD Care Coordinator Barriers to Discharge: Decreased caregiver support, Lack of/limited family support, Insurance for SNF coverage Care Coordinator Anticipated Follow Up Needs: HH/OP Expected length of stay: 7-12 Days  Clinical Impression SW met with pt to introduce self, explain role, discuss discharge process, and inform on ELOS. 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 [facility], [facility]Outpatient, and DME if needed. Aware this SW will [facility] with [facility] to find out service connection. No DME.  1319- SW left message for [facility] [place] to inquire about who is pt assigned SW, clinic, and service connection. SW waiting on follow-up.   Jerris Fleer A Jernie Schutt 12/13/2023, 1:24 PM

## 2023-12-13 NOTE — Progress Notes (Signed)
 PROGRESS NOTE   Subjective/Complaints:  Overall doing quite well. VAC dc'ed yesterday. IVCF placed yesterday without issue  ROS: Patient denies fever, rash, sore throat, blurred vision, dizziness, nausea, vomiting, diarrhea, cough, shortness of breath or chest pain,   headache, or mood change.    Objective:   IR IVC FILTER PLMT / S&I PORTER GUID/MOD SED Result Date: 12/12/2023 CLINICAL DATA:  Bilateral lower extremity DVT and status post thoracic spinal surgery. Need for DVT due to contraindication currently to therapeutic anticoagulation. EXAM: 1. ULTRASOUND GUIDANCE FOR VASCULAR ACCESS OF THE RIGHT INTERNAL JUGULAR VEIN. 2. IVC VENOGRAM. 3. PERCUTANEOUS IVC FILTER PLACEMENT. ANESTHESIA/SEDATION: Moderate (conscious) sedation was employed during this procedure. A total of Versed  2.0 mg and Fentanyl  100 mcg was administered intravenously. Moderate Sedation Time: 15 minutes. The patient's level of consciousness and vital signs were monitored continuously by radiology nursing throughout the procedure under my direct supervision. CONTRAST:  40mL OMNIPAQUE  IOHEXOL  300 MG/ML  SOLN FLUOROSCOPY: Radiation Exposure Index: 107.6 mGy Kerma PROCEDURE: The procedure, risks, benefits, and alternatives were explained to the patient. Questions regarding the procedure were encouraged and answered. The patient understands and consents to the procedure. A time-out was performed prior to initiating the procedure. The right neck was prepped with chlorhexidine  in a sterile fashion, and a sterile drape was applied covering the operative field. A sterile gown and sterile gloves were used for the procedure. Local anesthesia was provided with 1% Lidocaine . Ultrasound was utilized to confirm patency of the right internal jugular vein. An ultrasound image was saved and recorded. Under direct ultrasound guidance, a 21 gauge needle was advanced into the right internal jugular  vein with ultrasound image documentation performed. After securing access with a micropuncture dilator, a guidewire was advanced into the inferior vena cava. A deployment sheath was advanced over the guidewire. This was utilized to perform IVC venography. The deployment sheath was further positioned in an appropriate location for filter deployment. A Bard Denali IVC filter was then advanced in the sheath. This was then fully deployed in the infrarenal IVC. Final filter position was confirmed with a fluoroscopic spot image. After the procedure the sheath was removed and hemostasis obtained with manual compression. COMPLICATIONS: None. FINDINGS: IVC venography demonstrates a normal caliber IVC with no evidence of thrombus. Renal veins are identified bilaterally. The IVC filter was successfully positioned below the level of the renal veins and is appropriately oriented. This IVC filter has both permanent and retrievable indications. IMPRESSION: Placement of percutaneous IVC filter in infrarenal IVC. IVC venogram shows no evidence of IVC thrombus and normal caliber of the inferior vena cava. This filter does have both permanent and retrievable indications. PLAN: This IVC filter is potentially retrievable. The patient will be assessed for filter retrieval by Interventional Radiology in approximately 8-12 weeks. Further recommendations regarding filter retrieval, continued surveillance or declaration of device permanence, will be made at that time. Electronically Signed   By: Marcey Moan M.D.   On: 12/12/2023 10:24   Recent Labs    12/12/23 0710 12/13/23 0510  WBC 8.8 8.7  HGB 10.3* 9.9*  HCT 32.2* 30.5*  PLT 235 233   Recent Labs    12/12/23 0710  12/13/23 0510  NA 139 137  K 3.8 3.6  CL 103 102  CO2 27 27  GLUCOSE 114* 120*  BUN 12 13  CREATININE 1.12 1.23  CALCIUM  8.6* 8.2*    Intake/Output Summary (Last 24 hours) at 12/13/2023 1022 Last data filed at 12/13/2023 0800 Gross per 24 hour   Intake 720 ml  Output 1625 ml  Net -905 ml        Physical Exam: Vital Signs Blood pressure 114/67, pulse 95, temperature 98.3 F (36.8 C), temperature source Oral, resp. rate 18, SpO2 95%.     Constitutional: No distress . Vital signs reviewed. HEENT: NCAT, EOMI, oral membranes moist Neck: supple Cardiovascular: RRR without murmur. No JVD    Respiratory/Chest: CTA Bilaterally without wheezes or rales. Normal effort    GI/Abdomen: BS +, non-tender, non-distended Ext: no clubbing, cyanosis, or edema Psych: pleasant and cooperative  Skin: C/D/I. Back incision cdi with staples. Drain site with scarce drainage MSK:      No apparent deformity. + L knee TKR scar. R knee extension slightly limited 0-5 degrees--still an issue. Therapy massaging Neurologic exam:  Alert and oriented x 3. Normal insight and awareness. Intact Memory. Normal language and speech. Cranial nerve exam unremarkable.    Sensation: To light touch intact in BL UEs and Les; notes sensory decrease in all bilateral toes Reflexes: still 2+ in BL UE and LEs. Negative Hoffman's and babinski signs bilaterally.  CN: 2-12 grossly intact.  Coordination: No apparent tremors. + LLE ataxia with HTS; otherwise intact Spasticity: MAS 0 in all extremities.       Strength:                RUE: 5/5 SA, 5/5 EF, 5/5 EE, 5/5 WE, 5/5 FF, 5/5 FA                LUE:  5/5 SA, 5/5 EF, 5/5 EE, 5/5 WE, 5/5 FF, 5/5 FA                RLE: 4/5 HF, 5/5 KE, 5/5  DF, 5/5  EHL, 5/5  PF                 LLE:  4/5 HF, 4- to 4/5 KE, 5/5  DF, 5/5  EHL, 5/5  PF     Assessment/Plan: 1. Functional deficits which require 3+ hours per day of interdisciplinary therapy in a comprehensive inpatient rehab setting. Physiatrist is providing close team supervision and 24 hour management of active medical problems listed below. Physiatrist and rehab team continue to assess barriers to discharge/monitor patient progress toward functional and medical goals  Care  Tool:  Bathing    Body parts bathed by patient: Right arm, Left arm, Chest, Abdomen, Front perineal area, Buttocks, Right upper leg, Left upper leg, Face   Body parts bathed by helper: Right lower leg, Left lower leg     Bathing assist Assist Level: Minimal Assistance - Patient > 75%     Upper Body Dressing/Undressing Upper body dressing   What is the patient wearing?: Pull over shirt    Upper body assist Assist Level: Set up assist    Lower Body Dressing/Undressing Lower body dressing      What is the patient wearing?: Pants     Lower body assist Assist for lower body dressing: Contact Guard/Touching assist     Toileting Toileting    Toileting assist Assist for toileting: Minimal Assistance - Patient > 75%     Transfers  Chair/bed transfer  Transfers assist     Chair/bed transfer assist level: Contact Guard/Touching assist     Locomotion Ambulation   Ambulation assist      Assist level: Minimal Assistance - Patient > 75% Assistive device: Walker-rolling Max distance: 170   Walk 10 feet activity   Assist     Assist level: Minimal Assistance - Patient > 75% Assistive device: Walker-rolling   Walk 50 feet activity   Assist    Assist level: Minimal Assistance - Patient > 75% Assistive device: Walker-rolling    Walk 150 feet activity   Assist    Assist level: Minimal Assistance - Patient > 75% Assistive device: Walker-rolling    Walk 10 feet on uneven surface  activity   Assist Walk 10 feet on uneven surfaces activity did not occur: Safety/medical concerns         Wheelchair     Assist Is the patient using a wheelchair?: Yes Type of Wheelchair: Manual    Wheelchair assist level: Supervision/Verbal cueing Max wheelchair distance: 171ft    Wheelchair 50 feet with 2 turns activity    Assist        Assist Level: Supervision/Verbal cueing   Wheelchair 150 feet activity     Assist      Assist Level:  Supervision/Verbal cueing   Blood pressure 114/67, pulse 95, temperature 98.3 F (36.8 C), temperature source Oral, resp. rate 18, SpO2 95%.   Medical Problem List and Plan: 1. Functional deficits secondary to thoracic myelopathy.  Status post T3 transpedicular decompression, T1-T4 PSF 12/07/2023 per Dr. Penne Sharps neurosurgery.NO BRACE REQUIRED.  As per neurosurgery postoperative drain to be removed 12/11/2022.  Wound VAC can be removed when loses suction or when the battery runs down.             -patient may not shower until vac removed             -ELOS/Goals: 10-12 days, Mod I PT/OT             -Continue CIR therapies including PT, OT  2.  Antithrombotics: -DVT/anticoagulation:  Pharmaceutical: Lovenox .  Check vascular study             -antiplatelet therapy: N/A              - 7/11: Admission duplex + DVT R proximal posterior tibial veins, R mid-peroneal veins, L posterior tibial veins, L peroneal veins, and intramuscular thrombosis on R gastrocnemius veins. Rolan Haggard d/w Neurosurgery Dr. Theressa office, unable to start full dose AC until 7d post-op. Recommend continuation of PPX dose lovenox  40 mg daily with repeat Duplex Monday to ensure no extension.              -- Later, messaged by Dr. Sharps requesting additional consult for IVC filter; order placed, d/w [facility] for follow-up in AM with IR for eval   7/12- spoke with IR_ will do IVC filter tomorrow- and will make him NPO- went back and spoke to pt about plan  7/14 IVCF placed yesterday without issues--start full a/c tomorrow?? 3. Pain Management: Robaxin  and oxycodone  as needed 4. Mood/Behavior/Sleep: Paxil  20 mg daily             -antipsychotic agents:  5. Neuropsych/cognition: This patient is capable of making decisions on his own behalf. 6. Skin/Wound Care: Routine skin checks              - vac battery died. Removed. Dry dressing in place  -  almost no drainage from former drain site---will change to foam dressings  with perhaps a piece of gauze on drain site too 7. Fluids/Electrolytes/Nutrition: Routine in and outs with follow-up chemistries 8.  Constipation.  Colace 100 mg twice daily, MiraLAX  daily as needed             - LBM 7/12 9.  OSA.  CPAP 10.  Hypertension.  Currently on no antihypertensive medications or prior to admission.  Monitor with increased mobility  7/12- 7/13- BP doing better- will con't to monitor with activity  11. Anemia. 14->12->10 hgb, likely post-surgical. Monitor labs. Iron-Vitamin C -B12-FA supplement.     7/14 Hb 10.3-->9.9, has been trending down, likely post-op   -pt already on iron supp with vitamins above   -check stool OB   -follow up cbc wednesday   LOS: 3 days A FACE TO FACE EVALUATION WAS PERFORMED  Arthea ONEIDA Gunther 12/13/2023, 10:22 AM

## 2023-12-13 NOTE — IPOC Note (Addendum)
 Overall Plan of Care [facility]) Patient Details Name: Corey [name] MRN: [id number] DOB: 05-13-1966  Admitting Diagnosis: Thoracic myelopathy  Hospital Problems: Principal Problem:   Thoracic myelopathy Active Problems:   Coping style affecting medical condition     Functional Problem List: Nursing Bowel, Pain, Safety, Endurance, Medication Management  PT Balance, Endurance, Pain, Safety, Sensory  OT Balance, Pain, Safety, Motor, Endurance  SLP    TR         Basic ADL's: OT Grooming, Bathing, Dressing, Toileting     Advanced  ADL's: OT       Transfers: PT Bed Mobility, Bed to Chair, Car  OT Tub/Shower, Toilet     Locomotion: PT Ambulation, Stairs, Wheelchair Mobility     Additional Impairments: OT    SLP        TR      Anticipated Outcomes Item Anticipated Outcome  Self Feeding IND  Swallowing      Basic self-care  mod I  Toileting  mod I   Bathroom Transfers mod I  Bowel/Bladder  manage bowel w mod I assist  Transfers  mod I  Locomotion  mod I  Communication     Cognition     Pain  Pain < 4 with prns  Safety/Judgment  manage safety w cues   Therapy Plan: PT Intensity: Minimum of 1-2 x/day ,45 to 90 minutes PT Frequency: 5 out of 7 days PT Duration Estimated Length of Stay: 7-9 days OT Intensity: Minimum of 1-2 x/day, 45 to 90 minutes OT Frequency: 5 out of 7 days OT Duration/Estimated Length of Stay: 10-12 days     Team Interventions: Nursing Interventions Discharge Planning, Medication Management, Bowel Management, Disease Management/Prevention, Patient/Family Education, Pain Management  PT interventions Ambulation/gait training, Warden/ranger, Community reintegration, Discharge planning, Disease management/prevention, DME/adaptive equipment instruction, Functional electrical stimulation, Functional mobility training, Neuromuscular re-education, Pain management, Patient/family education, Psychosocial support,  Splinting/orthotics, Therapeutic Activities, Stair training, Therapeutic Exercise, UE/LE Strength taining/ROM, UE/LE Coordination activities, Wheelchair propulsion/positioning  OT Interventions Balance/vestibular training, Discharge planning, Pain management, Self Care/advanced ADL retraining, Therapeutic Activities, UE/LE Coordination activities, Cognitive remediation/compensation, Disease mangement/prevention, Functional mobility training, Patient/family education, Therapeutic Exercise, Community reintegration, Fish farm manager, Psychosocial support, UE/LE Strength taining/ROM  SLP Interventions    TR Interventions    SW/CM Interventions Discharge Planning, Psychosocial Support, Patient/Family Education   Barriers to Discharge MD  Medical stability  Nursing Decreased caregiver support, Home environment access/layout 1 level 2 ste no rail w spouse;mutiple daily falls due to progressive leg weakness,  10+ since beginning of June  PT Decreased caregiver support, Home environment access/layout    OT      SLP      SW Decreased caregiver support, Lack of/limited family support, Community education officer for SNF coverage     Team Discharge Planning: Destination: PT-Home ,OT- Home , SLP-  Projected Follow-up: PT-Outpatient PT, OT-  None, SLP-  Projected Equipment Needs: PT-To be determined, OT- Other (comment), SLP-  Equipment Details: PT- , OT-has rollator, walker, tub bench Patient/family involved in discharge planning: PT- Patient,  OT-Patient, Family member/caregiver, SLP-   MD ELOS: 9-10 days Medical Rehab Prognosis:  Excellent Assessment: The patient has been admitted for CIR therapies with the diagnosis of thoracic myelopathy. The team will be addressing functional mobility, strength, stamina, balance, safety, adaptive techniques and equipment, self-care, bowel and bladder mgt, patient and caregiver education, NMR, pain control, community reentry. Goals have been set at mod I with  mobility and self-care. Anticipated discharge destination is home.  See Team Conference Notes for weekly updates to the plan of care

## 2023-12-14 DIAGNOSIS — M4714 Other spondylosis with myelopathy, thoracic region: Secondary | ICD-10-CM | POA: Diagnosis not present

## 2023-12-14 MED ORDER — POLYETHYLENE GLYCOL 3350 17 G PO PACK
17.0000 g | PACK | Freq: Every day | ORAL | Status: DC
  Administered 2023-12-14 – 2023-12-17 (×3): 17 g via ORAL
  Filled 2023-12-14 (×6): qty 1

## 2023-12-14 MED ORDER — ACETAMINOPHEN 325 MG PO TABS: 325.0000 mg | ORAL_TABLET | ORAL | Status: DC | PRN

## 2023-12-14 NOTE — Progress Notes (Signed)
 PROGRESS NOTE   Subjective/Complaints:  Pt reports doing well- no issues from IVC filter or neck where it was placed.  LBM Saturday evening- normally goes 1x/day at home. Eating fruit for breakfast to try and go. Feels like could go this AM.   Pain tolerable- ~ 4/10 this AM- meds work when takes them ~ 2x/day.     ROS:  Pt denies SOB, abd pain, CP, N/V/ (+)C/D, and vision changes    Objective:   VAS US  LOWER EXTREMITY VENOUS (DVT) Result Date: 12/13/2023  Lower Venous DVT Study Patient Name:  Corey [name]  Date of Exam:   12/13/2023 Medical Rec #: [id number]       Accession #:    [id number] Date of Birth: [birth year]      Patient Gender: M Patient Age:   58 years Exam Location:  [facility] Procedure:      VAS US  LOWER EXTREMITY VENOUS (DVT) Referring Phys: JOESPH LIKES --------------------------------------------------------------------------------  Indications: Hx of BLE DVT.  Risk Factors: Surgery Back surgery 12/07/2023, IVC filter placement 12/12/2023. Comparison Study: 12/10/2023 RT prox PTV, Mid Pero v, and gatroc vein acute DVT                   and left distal PTV and pero vein acute DVT. Performing Technologist: Elmarie Lindau, RVT  Examination Guidelines: A complete evaluation includes B-mode imaging, spectral Doppler, color Doppler, and power Doppler as needed of all accessible portions of each vessel. Bilateral testing is considered an integral part of a complete examination. Limited examinations for reoccurring indications may be performed as noted. The reflux portion of the exam is performed with the patient in reverse Trendelenburg.  +---------+---------------+---------+-----------+----------+--------------+ RIGHT    CompressibilityPhasicitySpontaneityPropertiesThrombus Aging +---------+---------------+---------+-----------+----------+--------------+ CFV      Full           Yes      Yes                                  +---------+---------------+---------+-----------+----------+--------------+ SFJ      Full                                                        +---------+---------------+---------+-----------+----------+--------------+ FV Prox  Full                                                        +---------+---------------+---------+-----------+----------+--------------+ FV Mid   Full                                                        +---------+---------------+---------+-----------+----------+--------------+ FV DistalFull                                                        +---------+---------------+---------+-----------+----------+--------------+  PFV      Full                                                        +---------+---------------+---------+-----------+----------+--------------+ POP      Full           Yes      Yes                                 +---------+---------------+---------+-----------+----------+--------------+ PTV      Full                                                        +---------+---------------+---------+-----------+----------+--------------+ PERO     Full                                                        +---------+---------------+---------+-----------+----------+--------------+ Gastroc  None                                                        +---------+---------------+---------+-----------+----------+--------------+ There is acute appearing, noncompressible thrombus in 1 of 2 veins in both sets of gastroc veins.   +---------+---------------+---------+-----------+----------+--------------+ LEFT     CompressibilityPhasicitySpontaneityPropertiesThrombus Aging +---------+---------------+---------+-----------+----------+--------------+ CFV      Full           Yes      Yes                                 +---------+---------------+---------+-----------+----------+--------------+ SFJ      Full                                                         +---------+---------------+---------+-----------+----------+--------------+ FV Prox  Full                                                        +---------+---------------+---------+-----------+----------+--------------+ FV Mid   Full                                                        +---------+---------------+---------+-----------+----------+--------------+ FV DistalFull                                                        +---------+---------------+---------+-----------+----------+--------------+  PFV      Full                                                        +---------+---------------+---------+-----------+----------+--------------+ POP      Full           Yes      Yes                                 +---------+---------------+---------+-----------+----------+--------------+ PTV      Full                                                        +---------+---------------+---------+-----------+----------+--------------+ PERO     Full                                                        +---------+---------------+---------+-----------+----------+--------------+     Summary: RIGHT: - Findings consistent with acute deep vein thrombosis involving the gastrocnemius veins.  - No cystic structure found in the popliteal fossa. - There has been total resolution of the right proximal posterior tibial and mid peroneal veins thrombus.  LEFT: - There is no evidence of deep vein thrombosis in the lower extremity.  - No cystic structure found in the popliteal fossa. - There has been total resolution of the left distal posterior tibial and peroneal veins.  *See table(s) above for measurements and observations. Electronically signed by Fonda Rim on 12/13/2023 at 6:50:42 PM.    Final    IR IVC FILTER PLMT / S&I PORTER GUID/MOD SED Result Date: 12/12/2023 CLINICAL DATA:  Bilateral lower extremity DVT and  status post thoracic spinal surgery. Need for DVT due to contraindication currently to therapeutic anticoagulation. EXAM: 1. ULTRASOUND GUIDANCE FOR VASCULAR ACCESS OF THE RIGHT INTERNAL JUGULAR VEIN. 2. IVC VENOGRAM. 3. PERCUTANEOUS IVC FILTER PLACEMENT. ANESTHESIA/SEDATION: Moderate (conscious) sedation was employed during this procedure. A total of Versed  2.0 mg and Fentanyl  100 mcg was administered intravenously. Moderate Sedation Time: 15 minutes. The patient's level of consciousness and vital signs were monitored continuously by radiology nursing throughout the procedure under my direct supervision. CONTRAST:  40mL OMNIPAQUE  IOHEXOL  300 MG/ML  SOLN FLUOROSCOPY: Radiation Exposure Index: 107.6 mGy Kerma PROCEDURE: The procedure, risks, benefits, and alternatives were explained to the patient. Questions regarding the procedure were encouraged and answered. The patient understands and consents to the procedure. A time-out was performed prior to initiating the procedure. The right neck was prepped with chlorhexidine  in a sterile fashion, and a sterile drape was applied covering the operative field. A sterile gown and sterile gloves were used for the procedure. Local anesthesia was provided with 1% Lidocaine . Ultrasound was utilized to confirm patency of the right internal jugular vein. An ultrasound image was saved and recorded. Under direct ultrasound guidance, a 21 gauge needle was advanced into the right internal jugular vein with ultrasound image documentation performed. After securing access with a micropuncture dilator, a  guidewire was advanced into the inferior vena cava. A deployment sheath was advanced over the guidewire. This was utilized to perform IVC venography. The deployment sheath was further positioned in an appropriate location for filter deployment. A Bard Denali IVC filter was then advanced in the sheath. This was then fully deployed in the infrarenal IVC. Final filter position was confirmed  with a fluoroscopic spot image. After the procedure the sheath was removed and hemostasis obtained with manual compression. COMPLICATIONS: None. FINDINGS: IVC venography demonstrates a normal caliber IVC with no evidence of thrombus. Renal veins are identified bilaterally. The IVC filter was successfully positioned below the level of the renal veins and is appropriately oriented. This IVC filter has both permanent and retrievable indications. IMPRESSION: Placement of percutaneous IVC filter in infrarenal IVC. IVC venogram shows no evidence of IVC thrombus and normal caliber of the inferior vena cava. This filter does have both permanent and retrievable indications. PLAN: This IVC filter is potentially retrievable. The patient will be assessed for filter retrieval by Interventional Radiology in approximately 8-12 weeks. Further recommendations regarding filter retrieval, continued surveillance or declaration of device permanence, will be made at that time. Electronically Signed   By: Marcey Moan M.D.   On: 12/12/2023 10:24   Recent Labs    12/12/23 0710 12/13/23 0510  WBC 8.8 8.7  HGB 10.3* 9.9*  HCT 32.2* 30.5*  PLT 235 233   Recent Labs    12/12/23 0710 12/13/23 0510  NA 139 137  K 3.8 3.6  CL 103 102  CO2 27 27  GLUCOSE 114* 120*  BUN 12 13  CREATININE 1.12 1.23  CALCIUM  8.6* 8.2*    Intake/Output Summary (Last 24 hours) at 12/14/2023 9177 Last data filed at 12/14/2023 0421 Gross per 24 hour  Intake 360 ml  Output 1475 ml  Net -1115 ml        Physical Exam: Vital Signs Blood pressure 103/72, pulse 99, temperature 99.1 F (37.3 C), temperature source Oral, resp. rate 16, height 5' 9 (1.753 m), SpO2 98%.      General: awake, alert, appropriate, sitting up in w/c watching TV; NAD HENT: conjugate gaze; oropharynx moist CV: regular rhythm, but rate borderline tachycardic; no JVD Pulmonary: CTA B/L; no W/R/R- good air movement- regular respiratory rate GI: soft, NT,  maybe slightly distended?; hypoactive BS Psychiatric: appropriate, pleasant, quiet but interactive Neurological: Ox3  Ext: no clubbing, cyanosis, or edema Psych: pleasant and cooperative  Skin: C/D/I. Back incision cdi with staples. Drain site with scarce drainage MSK:      No apparent deformity. + L knee TKR scar. R knee extension slightly limited 0-5 degrees--still an issue. Therapy massaging Neurologic exam:  Alert and oriented x 3. Normal insight and awareness. Intact Memory. Normal language and speech. Cranial nerve exam unremarkable.    Sensation: To light touch intact in BL UEs and Les; notes sensory decrease in all bilateral toes Reflexes: still 2+ in BL UE and LEs. Negative Hoffman's and babinski signs bilaterally.  CN: 2-12 grossly intact.  Coordination: No apparent tremors. + LLE ataxia with HTS; otherwise intact Spasticity: MAS 0 in all extremities.       Strength:                RUE: 5/5 SA, 5/5 EF, 5/5 EE, 5/5 WE, 5/5 FF, 5/5 FA                LUE:  5/5 SA, 5/5 EF, 5/5 EE, 5/5 WE, 5/5 FF, 5/5  FA                RLE: 4/5 HF, 5/5 KE, 5/5  DF, 5/5  EHL, 5/5  PF                 LLE:  4/5 HF, 4- to 4/5 KE, 5/5  DF, 5/5  EHL, 5/5  PF     Assessment/Plan: 1. Functional deficits which require 3+ hours per day of interdisciplinary therapy in a comprehensive inpatient rehab setting. Physiatrist is providing close team supervision and 24 hour management of active medical problems listed below. Physiatrist and rehab team continue to assess barriers to discharge/monitor patient progress toward functional and medical goals  Care Tool:  Bathing    Body parts bathed by patient: Right arm, Left arm, Chest, Abdomen, Front perineal area, Buttocks, Right upper leg, Left upper leg, Face   Body parts bathed by helper: Right lower leg, Left lower leg     Bathing assist Assist Level: Minimal Assistance - Patient > 75%     Upper Body Dressing/Undressing Upper body dressing   What is the  patient wearing?: Pull over shirt    Upper body assist Assist Level: Set up assist    Lower Body Dressing/Undressing Lower body dressing      What is the patient wearing?: Pants     Lower body assist Assist for lower body dressing: Contact Guard/Touching assist     Toileting Toileting    Toileting assist Assist for toileting: Minimal Assistance - Patient > 75%     Transfers Chair/bed transfer  Transfers assist     Chair/bed transfer assist level: Contact Guard/Touching assist     Locomotion Ambulation   Ambulation assist      Assist level: Minimal Assistance - Patient > 75% Assistive device: Walker-rolling Max distance: 170   Walk 10 feet activity   Assist     Assist level: Minimal Assistance - Patient > 75% Assistive device: Walker-rolling   Walk 50 feet activity   Assist    Assist level: Minimal Assistance - Patient > 75% Assistive device: Walker-rolling    Walk 150 feet activity   Assist    Assist level: Minimal Assistance - Patient > 75% Assistive device: Walker-rolling    Walk 10 feet on uneven surface  activity   Assist Walk 10 feet on uneven surfaces activity did not occur: Safety/medical concerns         Wheelchair     Assist Is the patient using a wheelchair?: Yes Type of Wheelchair: Manual    Wheelchair assist level: Supervision/Verbal cueing Max wheelchair distance: 142ft    Wheelchair 50 feet with 2 turns activity    Assist        Assist Level: Supervision/Verbal cueing   Wheelchair 150 feet activity     Assist      Assist Level: Supervision/Verbal cueing   Blood pressure 103/72, pulse 99, temperature 99.1 F (37.3 C), temperature source Oral, resp. rate 16, height 5' 9 (1.753 m), SpO2 98%.   Medical Problem List and Plan: 1. Functional deficits secondary to thoracic myelopathy.  Status post T3 transpedicular decompression, T1-T4 PSF 12/07/2023 per Dr. Penne Sharps neurosurgery.NO BRACE  REQUIRED.  As per neurosurgery postoperative drain to be removed 12/11/2022.  Wound VAC can be removed when loses suction or when the battery runs down.             -patient may shower now that [facility] was removed             -  ELOS/Goals: 10-12 days, Mod I PT/OT            Con't CIR PT and OT  Team conference today to determine length of stay 2.  Antithrombotics: -DVT/anticoagulation:  Pharmaceutical: Lovenox .  Check vascular study             -antiplatelet therapy: N/A              - 7/11: Admission duplex + DVT R proximal posterior tibial veins, R mid-peroneal veins, L posterior tibial veins, L peroneal veins, and intramuscular thrombosis on R gastrocnemius veins. Rolan Haggard d/w Neurosurgery Dr. Theressa office, unable to start full dose AC until 7d post-op. Recommend continuation of PPX dose lovenox  40 mg daily with repeat Duplex Monday to ensure no extension.              -- Later, messaged by Dr. Claudene requesting additional consult for IVC filter; order placed, d/w [facility] PA for follow-up in AM with IR for eval   7/12- spoke with IR_ will do IVC filter tomorrow- and will make him NPO- went back and spoke to pt about plan  7/14 IVCF placed yesterday without issues--start full a/c tomorrow??  7/15- will reach out to Dr Claudene to see when can start therapeutic [facility], considering has IVC filter. Has been 7 days today, but will see what he says 3. Pain Management: Robaxin  and oxycodone  as needed 4. Mood/Behavior/Sleep: Paxil  20 mg daily             -antipsychotic agents:  5. Neuropsych/cognition: This patient is capable of making decisions on his own behalf. 6. Skin/Wound Care: Routine skin checks              - vac battery died. Removed. Dry dressing in place  -almost no drainage from former drain site---will change to foam dressings with perhaps a piece of gauze on drain site too  7/15- No significant drainage-  7. Fluids/Electrolytes/Nutrition: Routine in and outs with follow-up  chemistries 8.  Constipation.  Colace 100 mg twice daily, MiraLAX  daily as needed             - LBM 7/12  7/15- LBM 7/12 after sorbitol - feels like could go today- will make Miralax  daily since still taking pain meds 2x/day and normally goes 1x/day at home.  9.  OSA.  CPAP 10.  Hypertension.  Currently on no antihypertensive medications or prior to admission.  Monitor with increased mobility  7/12- 7/13- BP doing better- will con't to monitor with activity  11. Anemia. 14->12->10 hgb, likely post-surgical. Monitor labs. Iron-Vitamin C -B12-FA supplement.     7/14 Hb 10.3-->9.9, has been trending down, likely post-op   -pt already on iron supp with vitamins above   -check stool OB   -follow up cbc wednesday    I spent a total of  43  minutes on total care today- >50% coordination of care- due to  D/w Dr Claudene- as well as meds changes and d/w nursing- also team conference to determine length of stay.    LOS: 4 days A FACE TO FACE EVALUATION WAS PERFORMED  Corey [name] 12/14/2023, 8:22 AM

## 2023-12-14 NOTE — Patient Care Conference (Cosign Needed)
 Inpatient RehabilitationTeam Conference and Plan of Care Update Date: 12/14/2023   Time: 1108 am    Patient Name: Corey [name]      Medical Record Number: [id number]  Date of Birth: 1966-05-03 Sex: Male         Room/Bed: 4W09C/4W09C-01 Payor Info: Payor: [employer] / Plan: BCBS/[employer] PPO / Product Type: *No Product type* /    Admit Date/Time:  12/10/2023 12:33 PM  Primary Diagnosis:  Thoracic myelopathy  Hospital Problems: Principal Problem:   Thoracic myelopathy    Expected Discharge Date: Expected Discharge Date: 12/20/23  Team Members Present: Physician leading conference: Dr. Megan Lovorn Social Worker Present: Graeme Jude, LCSW Nurse Present: Eulalio Falls, RN PT Present: Schuyler Batter, PT OT Present: Camie Hoe, OT     Current Status/Progress Goal Weekly Team Focus  Bowel/Bladder   Patient is continent of bowel and bladder. Last BM 7/12   Patient will remain contient x2   Assess PRN for bladdder and bowel needs. Assist patient as needed    Swallow/Nutrition/ Hydration               ADL's   SBA UB ADLs, CGA with AE LB ADLs, CGA transfers, SBA bathing   mod I   dynamic standing, transfer training    Mobility   min-CGA for all mobility, Limited by BLE strength, L>R and L knee ROM restrictions   mod I  strength, ROM, functional mobility    Communication                Safety/Cognition/ Behavioral Observations               Pain   Patient's pain managed well with PRNs   Patient's need for PRNs will decrease/remain well controlled   Patient will have minimal to no pain    Skin   Patient has surgical site to upper back   Patient's surgical site will remain free of infection  Patient's surgical site will heal without issue      Discharge Planning:  D/C to home with his wife who will take FMLA. Fam edu on Friday 10-12pm. SW will confirm there are no barriers to discharge.    Team Discussion: Patient was  admitted post T3 transpedicular decompression, T1-T4 PSF. Patient post IVC filter due to DVT R proximal posterior tibial veins, R mid-peroneal veins, L posterior tibial veins, L peroneal veins, and intramuscular thrombosis on R gastrocnemius veins. Patient limited by pain, stiffness and  weakness of BLE,  L>R with left knee restrictions.  Patient on target to meet rehab goals: yes, currently patient needs SGA with upper body care and CGA with lower body care with AE. Patient needs CGA with transfers and all mobility. Overall goals for discharge are set for mod I assistance.   *See Care Plan and progress notes for long and short-term goals.   Revisions to Treatment Plan:  N/a   Teaching Needs: Safety, medications, transfers, toileting, etc.   Current Barriers to Discharge: Decreased caregiver support  Possible Resolutions to Barriers: Family Education OP follow up  DME: walker     Medical Summary Current Status: VAC off; and drain out- back pain 7/10 before meds- but 4/10 with meds; LBM Sat with sorbitol ; LE weakness due to thoracic myelopathy  Barriers to Discharge: Medical stability;Uncontrolled Pain;Weight bearing restrictions;Self-care education;Symptomatic Anemia  Barriers to Discharge Comments: limited by pain; weakness, L knee ROM- chronic; stiffness, not spasticity; using Adaptive equipment and devices Possible Resolutions to [employer]  Focus: goals mod I; will give Miralax  today/daily to get him tohave more regular BM's- ; DVT's- s/p IVC filter- will start Eliquis  IF Hb hasnt' dropped- d/c 7/21- outpt therapy   Continued Need for Acute Rehabilitation Level of Care: The patient requires daily medical management by a physician with specialized training in physical medicine and rehabilitation for the following reasons: Direction of a multidisciplinary physical rehabilitation program to maximize functional independence : Yes Medical management of patient stability for  increased activity during participation in an intensive rehabilitation regime.: Yes Analysis of laboratory values and/or radiology reports with any subsequent need for medication adjustment and/or medical intervention. : Yes   I attest that I was present, lead the team conference, and concur with the assessment and plan of the team.   Gwendy Boeder Gayo 12/14/2023, 1108 am

## 2023-12-14 NOTE — Progress Notes (Signed)
 Physical Therapy Session Note  Patient Details  Name: Corey [name] MRN: [id number] Date of Birth: 30-Jan-1966  Today's Date: 12/14/2023 PT Individual Time: 0900-1000, 1345-1445 PT Individual Time Calculation (min): 60 min, 60 min   Short Term Goals: Week 1:  PT Short Term Goal 1 (Week 1): STG = LTG due to ELOS  Skilled Therapeutic Interventions/Progress Updates:    Session 1: Pt seated in w/c on arrival and agreeable to therapy. Pt reports soreness in back and thigh muscles consistent with intensity of exercise performed yesterday. Pt ambulated to and from session. Noted gait was limited by knee stiffness/ROM. On return, noted improved gait pattern after stretching and VC for increased knee flexion. Pt transitioned in/out of quadruped on mat table with CGA and VC. Completed 5 rounds of UE only bird dogs with cues for maintaining still trunk for improved core strength and stability. Pt also performed self knee stretch and therapist provided stretch to assess knee ROM, note pt is not using full range during gait. Performed hamstring curls with blue T band for improved neuromuscular control to reach full range. Noted improvement in walk back to room. Pt remained in w/c at end of session and was left with needs in reach.   Session 2: pt received in bed and agreeable to therapy. Pt reports 7/10 back pain, states he wants to wait until after session to take pain medication, rest and positioning as needed.   Pt ambulated to/from therapy gym with CGA, continued to be limited by BLE stiffness, L>R. On examination, noted BLE spasticity, L>R, MAS 1 on RLE and 1+ on LLE.  Pt participated in squats 6 x 10 for BLE strength with emphasis on achieving full hip and knee extension. Pt then performed standing marches from staggered stance with 4lb ankle weights for dynamic balance, strength into hip flexion for direct correlation to gait and stair climbing.   Pt returned to room and remained in w/c to await OT  session.   Therapy Documentation Precautions:  Precautions Precautions: Fall, Back Precaution Booklet Issued: Yes (comment) Recall of Precautions/Restrictions: Intact Precaution/Restrictions Comments: spinal precautions, no brace needed per orders Restrictions Weight Bearing Restrictions Per Provider Order: No General:       Therapy/Group: Individual Therapy  [name] 12/14/2023, 12:59 PM

## 2023-12-14 NOTE — Progress Notes (Addendum)
 Patient ID: Corey [name], male   DOB: [birth year], 58 y.o.   MRN: [id number]  SW received a message back from [place] SW reporting pt assigned SW is Corey [name] [phone]. [phone]).  SW met with pt in room to provide updates from team conference, d/c date 7/21, and d/c recs outpatient PT/OT and RW. Has RW. Prefers [facility] for outpatient. SW will confirm both services are offered, if not, referral will be sent to [facility]. Pt wife asked SW to call her later.   1348- Sw called pt wife but she will return call to SW as she was with a patient.   1351- SW left message for Corey [name] [phone]) to confirm they offer both PT/OT and if so, a fax number to send referral. SW waiting on follow-up.   1428- SW left message for [employer] SW to discuss pt service connection and who is PCP. SW also sent an email as well and waiting on follow-up.   *SW received message for [facility] reporting pt would need [facility]. Referral eill be sent to [facility] for PT/OT- [address] location.   Corey [name], MSW, LCSW Office: [phone] Cell: [phone] Fax: [phone]

## 2023-12-14 NOTE — Progress Notes (Signed)
 Occupational Therapy Session Note  Patient Details  Name: Corey [name] MRN: [id number] Date of Birth: [birth year]  Today's Date: 12/14/2023 OT Individual Time: 1500-1545 OT Individual Time Calculation (min): 45 min    Short Term Goals: Week 1:  OT Short Term Goal 1 (Week 1): patient will complete toileting act with sup A OT Short Term Goal 2 (Week 1): patient will complete bathing at sink or shower CGA with AE and min verbal cues OT Short Term Goal 3 (Week 1): patient will complete UB/ LB dressing SUP with AE  Skilled Therapeutic Interventions/Progress Updates:      Therapy Documentation Precautions:  Precautions Precautions: Fall, Back Precaution Booklet Issued: Yes (comment) Recall of Precautions/Restrictions: Intact Precaution/Restrictions Comments: spinal precautions, no brace needed per orders Restrictions Weight Bearing Restrictions Per Provider Order: No General:  Pt seated in W/C upon OT arrival, agreeable to OT.  Pain: no pain reported  ADL: OT providing skilled intervention on ADL retraining in order to increase independence with tasks and increase activity tolerance. Pt completed the following tasks at the current level of assist: Bed mobility: Grooming/oral hygiene: UB dressing: SBA in standing doffing/donning overhead shirt in unsupported standing, no LOB LB dressing: SBA, able to manage pants over waist in unsupported standing, doffing over feet by stepping out of pants, donning with use of reacher over feet and standing to manage over waist Footwear: total A fr TED hose and shoes  Shower transfer: CGA emerging SBA with RW and increased pace noted this session Bathing: SBA with long handle sponge, able to stand unsupported to wash peri area at SBA  Other Treatments: OT discussing DME needs for D/C preparation. Pt reported already owns shower chair and OT not recommending BSC so pt will not required any equipment.   Pt seated in W/C at end of session with W/C  alarm donned, call light within reach and 4Ps assessed.     Therapy/Group: Individual Therapy  Camie Hoe, OTD, OTR/L 12/14/2023, 4:01 PM

## 2023-12-14 NOTE — Progress Notes (Signed)
 Physical Therapy Session Note  Patient Details  Name: Corey [name] MRN: [id number] Date of Birth: [birth year]  Today's Date: 12/14/2023 PT Individual Time: 1105-1200 PT Individual Time Calculation (min): 55 min   Short Term Goals: Week 1:  PT Short Term Goal 1 (Week 1): STG = LTG due to ELOS  Skilled Therapeutic Interventions/Progress Updates:     Pt received seated in [facility] and agrees to therapy. No complaint of pain but does report fatigue from earlier session. PT provides rest breaks as needed throughout session. WC transport to gym. Pt performs stand step transfer from [facility] to mat table with minA and cues for sequencing and positioning. Pt performs sit to stand with minA from mat table and attempts weight shifting and marching in place. Pt noted to have difficulty stabilizing on stance leg and maintaining lifted leg in neutral position, with PT noting that lifted leg tends to adduct. Pt then transitions to Rt sidelying and performs Lt hip abduction against gravity. Pt completes x10 with PT providing tactile cues throughout and pt noted to have difficulty completing without significant compensation and utilization of hip flexors. Pt transitions to supine to performs hip abduction with gravity minimized, x15 with each leg. Pt then completes 4x10 bridges in hooklying with green theraband around distal thighs to increase gluteus medius engagement. PT provides cues for transverse abdominus engagement as well. Pt performs 3x15 clamshells in hooklying with green theraband. Pt then completes x20 total supine heel taps on mat, holding legs in 90 degrees hip and knee flexion, working on core engagement and body mechanics. Supine to sit with cues for logrolling., Stand step back to WC with minA. Left seated with all needs within reach.   Therapy Documentation Precautions:  Precautions Precautions: Fall, Back Precaution Booklet Issued: Yes (comment) Recall of Precautions/Restrictions:  Intact Precaution/Restrictions Comments: spinal precautions, no brace needed per orders Restrictions Weight Bearing Restrictions Per Provider Order: No   Therapy/Group: Individual Therapy  Elsie JAYSON Dawn, PT, DPT 12/14/2023, 4:27 PM

## 2023-12-15 ENCOUNTER — Other Ambulatory Visit (HOSPITAL_COMMUNITY): Payer: Self-pay

## 2023-12-15 ENCOUNTER — Telehealth (HOSPITAL_COMMUNITY): Payer: Self-pay | Admitting: Pharmacy Technician

## 2023-12-15 DIAGNOSIS — F54 Psychological and behavioral factors associated with disorders or diseases classified elsewhere: Secondary | ICD-10-CM

## 2023-12-15 DIAGNOSIS — M4714 Other spondylosis with myelopathy, thoracic region: Secondary | ICD-10-CM | POA: Diagnosis not present

## 2023-12-15 LAB — OCCULT BLOOD X 1 CARD TO LAB, STOOL: Fecal Occult Bld: NEGATIVE

## 2023-12-15 LAB — HEMOGLOBIN AND HEMATOCRIT, BLOOD
HCT: 31.5 % — ABNORMAL LOW (ref 39.0–52.0)
Hemoglobin: 10.3 g/dL — ABNORMAL LOW (ref 13.0–17.0)

## 2023-12-15 MED ORDER — SORBITOL 70 % SOLN
30.0000 mL | Freq: Once | Status: DC
  Filled 2023-12-15: qty 30

## 2023-12-15 MED ORDER — APIXABAN 5 MG PO TABS
5.0000 mg | ORAL_TABLET | Freq: Two times a day (BID) | ORAL | Status: DC
  Administered 2023-12-15 – 2023-12-20 (×11): 5 mg via ORAL
  Filled 2023-12-15 (×11): qty 1

## 2023-12-15 NOTE — Progress Notes (Signed)
 Occupational Therapy Session Note  Patient Details  Name: Corey [name] MRN: [id number] Date of Birth: [birth year]  Session 1: Today's Date: 12/15/2023 OT Individual Time: 8982-8954 OT Individual Time Calculation (min): 28 min   Session 2: Today's Date: 12/15/2023 OT Individual Time: 1330-1430 OT Individual Time Calculation (min): 60 min   Short Term Goals: Week 1:  OT Short Term Goal 1 (Week 1): patient will complete toileting act with sup A OT Short Term Goal 2 (Week 1): patient will complete bathing at sink or shower CGA with AE and min verbal cues OT Short Term Goal 3 (Week 1): patient will complete UB/ LB dressing SUP with AE  Session 1: Skilled Therapeutic Interventions/Progress Updates:   Patient agreeable to participate in OT session. Reports 4/10 pain level.   Patient participated in skilled OT session focusing on mobilization of LE, increased functional mobility. Patient able to complete functional mobility for 50 ft to increase mobility. Patient reported increased soreness in quad leading to decrease in tolerance.  Therapist facilitated increased mobility in LE.    Session 2: Skilled Therapeutic Interventions/Progress Updates:   Patient agreeable to participate in OT session. Reports 5/10 pain level.   Patient participated in skilled OT session focusing on ADL performance with bathing and dressing. Patient completed ADLs at the following levels: UB dressing- SUP- SBA LB dressing- SBA with AE Bathing- SUP with long handled sponge  Toilet- SUP to SBA with peri care and clothing Transfers- SBA Footwear total A for donning tedhose   Patient discussing completion of ADL's with compliance of back precautions. Patient completed standing mini squats with full quad extension to increase standing stability.      Therapy Documentation Precautions:  Precautions Precautions: Fall, Back Precaution Booklet Issued: Yes (comment) Recall of Precautions/Restrictions:  Intact Precaution/Restrictions Comments: spinal precautions, no brace needed per orders Restrictions Weight Bearing Restrictions Per Provider Order: No   Therapy/Group: Individual Therapy  D'mariea L Aris Moman 12/15/2023, 7:44 AM

## 2023-12-15 NOTE — Progress Notes (Signed)
 PROGRESS NOTE   Subjective/Complaints:  Pt reports put once on back- heat gell pack not available.  Got meds at 6am- and feeling better.   We discussed that erectile dysfunction can be possible form the [place] compression he had- he reports no AM erections and had small discussion that once able to participate in intimacy- Ok'd by Neurosurgery, that we can see if needs Viagra Rx.    ROS:   Per HPI   Pt denies SOB, abd pain, CP, N/V/ (+)C/D, and vision changes    Objective:   VAS US  LOWER EXTREMITY VENOUS (DVT) Result Date: 12/13/2023  Lower Venous DVT Study Patient Name:  Corey [name]  Date of Exam:   12/13/2023 Medical Rec #: [id number]       Accession #:    [id number] Date of Birth: 17-Feb-1966      Patient Gender: M Patient Age:   58 years Exam Location:  [facility] Procedure:      VAS US  LOWER EXTREMITY VENOUS (DVT) Referring Phys: JOESPH LIKES --------------------------------------------------------------------------------  Indications: Hx of BLE DVT.  Risk Factors: Surgery Back surgery 12/07/2023, IVC filter placement 12/12/2023. Comparison Study: 12/10/2023 RT prox PTV, Mid Pero v, and gatroc vein acute DVT                   and left distal PTV and pero vein acute DVT. Performing Technologist: Elmarie Lindau, RVT  Examination Guidelines: A complete evaluation includes B-mode imaging, spectral Doppler, color Doppler, and power Doppler as needed of all accessible portions of each vessel. Bilateral testing is considered an integral part of a complete examination. Limited examinations for reoccurring indications may be performed as noted. The reflux portion of the exam is performed with the patient in reverse Trendelenburg.  +---------+---------------+---------+-----------+----------+--------------+ RIGHT    CompressibilityPhasicitySpontaneityPropertiesThrombus Aging  +---------+---------------+---------+-----------+----------+--------------+ CFV      Full           Yes      Yes                                 +---------+---------------+---------+-----------+----------+--------------+ SFJ      Full                                                        +---------+---------------+---------+-----------+----------+--------------+ FV Prox  Full                                                        +---------+---------------+---------+-----------+----------+--------------+ FV Mid   Full                                                        +---------+---------------+---------+-----------+----------+--------------+  FV DistalFull                                                        +---------+---------------+---------+-----------+----------+--------------+ PFV      Full                                                        +---------+---------------+---------+-----------+----------+--------------+ POP      Full           Yes      Yes                                 +---------+---------------+---------+-----------+----------+--------------+ PTV      Full                                                        +---------+---------------+---------+-----------+----------+--------------+ PERO     Full                                                        +---------+---------------+---------+-----------+----------+--------------+ Gastroc  None                                                        +---------+---------------+---------+-----------+----------+--------------+ There is acute appearing, noncompressible thrombus in 1 of 2 veins in both sets of gastroc veins.   +---------+---------------+---------+-----------+----------+--------------+ LEFT     CompressibilityPhasicitySpontaneityPropertiesThrombus Aging +---------+---------------+---------+-----------+----------+--------------+ CFV      Full            Yes      Yes                                 +---------+---------------+---------+-----------+----------+--------------+ SFJ      Full                                                        +---------+---------------+---------+-----------+----------+--------------+ FV Prox  Full                                                        +---------+---------------+---------+-----------+----------+--------------+ FV Mid   Full                                                        +---------+---------------+---------+-----------+----------+--------------+  FV DistalFull                                                        +---------+---------------+---------+-----------+----------+--------------+ PFV      Full                                                        +---------+---------------+---------+-----------+----------+--------------+ POP      Full           Yes      Yes                                 +---------+---------------+---------+-----------+----------+--------------+ PTV      Full                                                        +---------+---------------+---------+-----------+----------+--------------+ PERO     Full                                                        +---------+---------------+---------+-----------+----------+--------------+     Summary: RIGHT: - Findings consistent with acute deep vein thrombosis involving the gastrocnemius veins.  - No cystic structure found in the popliteal fossa. - There has been total resolution of the right proximal posterior tibial and mid peroneal veins thrombus.  LEFT: - There is no evidence of deep vein thrombosis in the lower extremity.  - No cystic structure found in the popliteal fossa. - There has been total resolution of the left distal posterior tibial and peroneal veins.  *See table(s) above for measurements and observations. Electronically signed by Fonda Rim on 12/13/2023 at 6:50:42  PM.    Final    Recent Labs    12/13/23 0510 12/15/23 0428  WBC 8.7  --   HGB 9.9* 10.3*  HCT 30.5* 31.5*  PLT 233  --    Recent Labs    12/13/23 0510  NA 137  K 3.6  CL 102  CO2 27  GLUCOSE 120*  BUN 13  CREATININE 1.23  CALCIUM  8.2*    Intake/Output Summary (Last 24 hours) at 12/15/2023 0840 Last data filed at 12/15/2023 0816 Gross per 24 hour  Intake 1560 ml  Output 2425 ml  Net -865 ml        Physical Exam: Vital Signs Blood pressure 115/71, pulse 97, temperature 98.6 F (37 C), temperature source Oral, resp. rate 18, height 5' 9 (1.753 m), weight 107.5 kg, SpO2 98%.       General: awake, alert, appropriate, sitting up in w/c- with ice pack on upper/mid back;  NAD HENT: conjugate gaze; oropharynx moist CV: regular rate and rhythm; no JVD Pulmonary: CTA B/L; no W/R/R- good air movement GI: soft, NT, somewhat distended; hypoactive BS Psychiatric: appropriate- quiet, but asking  appropriate questions Neurological: Ox3   Ext: no clubbing, cyanosis, or edema Psych: pleasant and cooperative  Skin: C/D/I. Back incision cdi with staples. Drain site with scarce drainage MSK:      No apparent deformity. + L knee TKR scar. R knee extension slightly limited 0-5 degrees--still an issue. Therapy massaging Neurologic exam:  Alert and oriented x 3. Normal insight and awareness. Intact Memory. Normal language and speech. Cranial nerve exam unremarkable.    Sensation: To light touch intact in BL UEs and Les; notes sensory decrease in all bilateral toes Reflexes: still 2+ in BL UE and LEs. Negative Hoffman's and babinski signs bilaterally.  CN: 2-12 grossly intact.  Coordination: No apparent tremors. + LLE ataxia with HTS; otherwise intact Spasticity: MAS 0 in all extremities.       Strength:                RUE: 5/5 SA, 5/5 EF, 5/5 EE, 5/5 WE, 5/5 FF, 5/5 FA                LUE:  5/5 SA, 5/5 EF, 5/5 EE, 5/5 WE, 5/5 FF, 5/5 FA                RLE: 4/5 HF, 5/5 KE, 5/5   DF, 5/5  EHL, 5/5  PF                 LLE:  4/5 HF, 4- to 4/5 KE, 5/5  DF, 5/5  EHL, 5/5  PF     Assessment/Plan: 1. Functional deficits which require 3+ hours per day of interdisciplinary therapy in a comprehensive inpatient rehab setting. Physiatrist is providing close team supervision and 24 hour management of active medical problems listed below. Physiatrist and rehab team continue to assess barriers to discharge/monitor patient progress toward functional and medical goals  Care Tool:  Bathing    Body parts bathed by patient: Right arm, Left arm, Chest, Abdomen, Front perineal area, Buttocks, Right upper leg, Left upper leg, Face   Body parts bathed by helper: Right lower leg, Left lower leg     Bathing assist Assist Level: Minimal Assistance - Patient > 75%     Upper Body Dressing/Undressing Upper body dressing   What is the patient wearing?: Pull over shirt    Upper body assist Assist Level: Set up assist    Lower Body Dressing/Undressing Lower body dressing      What is the patient wearing?: Pants     Lower body assist Assist for lower body dressing: Contact Guard/Touching assist     Toileting Toileting    Toileting assist Assist for toileting: Minimal Assistance - Patient > 75%     Transfers Chair/bed transfer  Transfers assist     Chair/bed transfer assist level: Contact Guard/Touching assist     Locomotion Ambulation   Ambulation assist      Assist level: Minimal Assistance - Patient > 75% Assistive device: Walker-rolling Max distance: 170   Walk 10 feet activity   Assist     Assist level: Minimal Assistance - Patient > 75% Assistive device: Walker-rolling   Walk 50 feet activity   Assist    Assist level: Minimal Assistance - Patient > 75% Assistive device: Walker-rolling    Walk 150 feet activity   Assist    Assist level: Minimal Assistance - Patient > 75% Assistive device: Walker-rolling    Walk 10 feet on uneven  surface  activity   Assist Walk 10 feet on uneven  surfaces activity did not occur: Safety/medical concerns         Wheelchair     Assist Is the patient using a wheelchair?: Yes Type of Wheelchair: Manual    Wheelchair assist level: Supervision/Verbal cueing Max wheelchair distance: 19ft    Wheelchair 50 feet with 2 turns activity    Assist        Assist Level: Supervision/Verbal cueing   Wheelchair 150 feet activity     Assist      Assist Level: Supervision/Verbal cueing   Blood pressure 115/71, pulse 97, temperature 98.6 F (37 C), temperature source Oral, resp. rate 18, height 5' 9 (1.753 m), weight 107.5 kg, SpO2 98%.   Medical Problem List and Plan: 1. Functional deficits secondary to thoracic myelopathy.  Status post T3 transpedicular decompression, T1-T4 PSF 12/07/2023 per Dr. Penne Sharps neurosurgery.NO BRACE REQUIRED.  As per neurosurgery postoperative drain to be removed 12/11/2022.  Wound VAC can be removed when loses suction or when the battery runs down.             -patient may shower now that [facility] was removed             -ELOS/Goals: 10-12 days, Mod I PT/OT            D/c 7/21  Con't CIR PT and OT   2.  Antithrombotics: -DVT/anticoagulation:  Pharmaceutical: Lovenox .  Check vascular study             -antiplatelet therapy: N/A              - 7/11: Admission duplex + DVT R proximal posterior tibial veins, R mid-peroneal veins, L posterior tibial veins, L peroneal veins, and intramuscular thrombosis on R gastrocnemius veins. Rolan Haggard d/w Neurosurgery Dr. Theressa office, unable to start full dose AC until 7d post-op. Recommend continuation of PPX dose lovenox  40 mg daily with repeat Duplex Monday to ensure no extension.              -- Later, messaged by Dr. Sharps requesting additional consult for IVC filter; order placed, d/w [facility] for follow-up in AM with IR for eval   7/12- spoke with IR_ will do IVC filter tomorrow- and will  make him NPO- went back and spoke to pt about plan  7/14 IVCF placed yesterday without issues--start full a/c tomorrow??  7/15- will reach out to Dr Sharps to see when can start therapeutic [facility], considering has IVC filter. Has been 7 days today, but will see what he says  7/16- starting Eliquis  5 mg BID today- don't feel comfortable starting 10 mg BID  due to being 9 days out from surgery on spine, and risk of epidural hematoma- so since he has IVC filter, I think the benefits of 5 mg BID outweigh risks, but not the 10 mg BID- d/w pt and pharmacy. Still has R gastroc DVT, but LLE DVTs have resolved, of note.  3. Pain Management: Robaxin  and oxycodone  as needed  7/16- pain is manageable-  4. Mood/Behavior/Sleep: Paxil  20 mg daily             -antipsychotic agents:  5. Neuropsych/cognition: This patient is capable of making decisions on his own behalf. 6. Skin/Wound Care: Routine skin checks              - vac battery died. Removed. Dry dressing in place  -almost no drainage from former drain site---will change to foam dressings with perhaps a piece of gauze on  drain site too  7/15- No significant drainage-  7. Fluids/Electrolytes/Nutrition: Routine in and outs with follow-up chemistries 8.  Constipation.  Colace 100 mg twice daily, MiraLAX  daily as needed             - LBM 7/12  7/15- LBM 7/12 after sorbitol - feels like could go today- will make Miralax  daily since still taking pain meds 2x/day and normally goes 1x/day at home.   7/16- no BM with Miralax - will order Sorbitol  after therapy at 4pm and Soap suds enema if doesn't have BM by 8pm.  9.  OSA.  CPAP 10.  Hypertension.  Currently on no antihypertensive medications or prior to admission.  Monitor with increased mobility  7/12- 7/13- BP doing better- will con't to monitor with activity  11. Anemia. 14->12->10 hgb, likely post-surgical. Monitor labs. Iron-Vitamin C -B12-FA supplement.     7/14 Hb 10.3-->9.9, has been trending down, likely  post-op   -pt already on iron supp with vitamins above   -check stool OB   -follow up cbc wednesday     I spent a total of 53   minutes on total care today- >50% coordination of care- due to  D/w pharmacy about his Eliquis - also educated pt cannot take tumeric while on Eliquis - can take his other supplements we discussed- double checked with Up to date to make sure.  also d/w pt his possible erectile dysfunction due to [facility] compression-   LOS: 5 days A FACE TO FACE EVALUATION WAS PERFORMED  Bush Murdoch 12/15/2023, 8:40 AM

## 2023-12-15 NOTE — Consult Note (Signed)
 Neuropsychological Consultation Comprehensive Inpatient Rehab   Patient:   Corey [name]   DOB:   09-11-1965  MR Number:  [id number]  Location:  [facility] [address] CENTER A [address] [place] [address] Dept: [phone] Loc: [phone]           Date of Service:   12/15/2023  Start Time:   3 PM End Time:   4 PM  Provider/Observer:  Norleen Asa, Psy.D.       Clinical Neuropsychologist       Billing Code/Service: [phone]  Reason for Service:    Corey [name] is a 58 year old male referred for neuropsychological consultation during ongoing admission to the comprehensive inpatient rehabilitation unit (CIR) due to decreased functional mobility following recent neurosurgical interventions for severe stenosis in the thoracic spine. The patient presented on 12/06/2023 with progressive sensation loss in bilateral lower extremities, which started in his feet and progressed to his mid-abdomen. The patient was admitted to CIR following surgery on 12/07/2023.  - Severity: Decreased functional mobility. Reports full movement but not complete return of strength in lower extremities. Acknowledged some anxiety and concern about recovery.  Past Medical & Psychiatric History: - Psychiatric Diagnoses: Depression, anxiety. Denied any exacerbation of his underlying depression and anxieties during the visit. - Treatments and Hospitalizations: Admitted to CIR for comprehensive inpatient rehabilitation. Underwent T3 decompression and T1-T4 posterior spinal fusion on 12/07/2023. Past surgical microdiscectomy at L5-S1. Left total knee arthroplasty in 2022. - Medical history: Hypertension, hyperlipidemia, sleep apnea. MRI revealed multi-level thoracic spondylosis with diffuse spinal stenosis from T1-2 through T3-4, severe at the T2-3 level, with secondary cord flattening, compression, and edema. Myelogram showed multifactorial degenerative  changes with multi-level foraminal narrowing. - Medications: Not on anti-hypertensive medications or statins. Non-compliant with management for sleep apnea.   - Insight and Judgment: Acknowledges understanding of the need for ongoing therapies post-discharge and the need to address chronic health issues like hypertension, hyperlipidemia, and obstructive sleep apnea. Motivated to improve self-care post-discharge.  Treatment Plan: - Goals: Address non-compliance with management for chronic medical issues post-discharge. - Interventions: Addressed non-compliance with management for hypertension, hyperlipidemia, and obstructive sleep apnea as factors requiring attention post-discharge. - Progress Notes: The patient presented for neuropsychological consultation while on the CIR unit. The discussion focused on his anxiety about recovery and non-compliance with managing his chronic medical conditions. The patient reported his mood has been increasingly positive since the surgery. He acknowledges the need for ongoing therapy and is motivated to take better care of himself after discharge.  Risk Assessment: - Risk to Self & Others: No suicidal or homicidal ideation reported. No self-harm reported. No recent or past incidents of violence or aggression reported. - Protective Factors: Motivated to engage in therapeutic efforts and improve self-care.   Additional Notes: The patient reports full movement but not complete return of strength in his lower extremities.  Medical History:   Past Medical History:  Diagnosis Date   Arthritis    knee, left   ED (erectile dysfunction)    Hyperlipidemia    Hypertension    Sleep apnea    CPAP - not using lately due to recall          Patient Active Problem List   Diagnosis Date Noted   Coping style affecting medical condition 12/15/2023   Myelopathy concurrent with and due to spinal stenosis of thoracic region [facility]) 12/06/2023   Spinal stenosis 12/06/2023    Thoracic myelopathy  12/06/2023   Morbid obesity (HCC) 12/06/2023   Depression 12/06/2023   Radiculopathy, lumbar region 11/10/2023   Flexion contracture of knee, left 03/01/2023   Prediabetes 11/19/2022   Status post laparoscopic appendectomy 01/14/2021   H/O total knee replacement, left 11/11/2020   Leiomyoma 11/08/2020   Arthritis 09/26/2018   Onychomycosis 09/26/2018   Hyperlipidemia LDL goal <100 08/31/2016   OSA on CPAP 08/31/2016   Premature ejaculation 08/19/2011    Behavioral Observation/Mental Status:   Corey [name]  presents as a 58 y.o.-year-old Right handed African American Male who appeared his stated age. his dress was Appropriate and he was Well Groomed and his manners were Appropriate to the situation.  his participation was indicative of Appropriate and Attentive behaviors.  There were physical disabilities noted.  he displayed an appropriate level of cooperation and motivation.    Interactions:    Active Appropriate  Attention:   within normal limits and attention span and concentration were age appropriate  Memory:   within normal limits; recent and remote memory intact  Visuo-spatial:   within normal limits  Speech (Volume):  normal  Speech:   normal; normal  Thought Process:  Coherent and Relevant  Coherent and Linear  Though Content:  WNL; not suicidal and not homicidal  Orientation:   person, place, time/date, and situation  Judgment:   Good  Planning:   Fair  Affect:    Appropriate  Mood:    Dysphoric  Insight:   Good  Intelligence:   normal  Family Med/Psych History:  Family History  Problem Relation Age of Onset   Alzheimer's disease Mother    Stevens-Johnson syndrome Father    Autism Brother    Colon cancer Other    Colon polyps Neg Hx    Esophageal cancer Neg Hx    Rectal cancer Neg Hx    Stomach cancer Neg Hx     Disposition/Plan:   Goals: Address non-compliance with management for chronic medical issues  post-discharge. - Interventions: Addressed non-compliance with management for hypertension, hyperlipidemia, and obstructive sleep apnea as factors requiring attention post-discharge. - Progress Notes: The patient presented for neuropsychological consultation while on the CIR unit. The discussion focused on his anxiety about recovery and non-compliance with managing his chronic medical conditions. The patient reported his mood has been increasingly positive since the surgery. He acknowledges the need for ongoing therapy and is motivated to take better care of himself after discharge.           Electronically Signed   _______________________ Norleen Asa, Psy.D. Clinical Neuropsychologist

## 2023-12-15 NOTE — Progress Notes (Signed)
 Physical Therapy Session Note  Patient Details  Name: Corey [name] MRN: [id number] Date of Birth: 26-May-1966  Today's Date: 12/15/2023 PT Individual Time: 1050-1159 PT Individual Time Calculation (min): 69 min   Short Term Goals: Week 1:  PT Short Term Goal 1 (Week 1): STG = LTG due to ELOS  Skilled Therapeutic Interventions/Progress Updates:     Pt received seated in [facility] and agrees to therapy. No complaint of pain. WC transport to gym fort ime management. Pt performs stand step transfer to/from [facility] with CGA and cues for hand placement and sequencing. Pt then performs sidestepping along hall rail to challenge hip abductor endurance and strength, as well as dynamic balance. Pt noted to compensate with upper body, leaning contralateral to abducted leg. Pt completes x15' Rt and Lt with cues for body mechanics and correct performance. Following, pt performs standing hip abduction, facing wall, with cones on Rt and Lt as targets to tap alternating feet on with cues for optimal mechanics and posture. Pt completes 3x5 with each leg. Pt then performs standing heel raises holding onto back of chair, completing x20 and verbalizing increase in fatigue in LLE. Following rest break, pt completes single leg heels raise with isometric hold to fatigue, completing x3 rounds with each leg. Pt able to hold ~40 seconds with Rt heel on initial attempt and ~30 seconds with LLE, though does not lift heel as high on Lt side, indicating decreased strength and/or available ROM. Following seated rest break, pt completes 3x20 alternating knee taps on back of chair to facilitate high marching and endurance training of hip flexors. Pt transfers onto mat and then onto scale to weigh himself with cues for hand placement and body mechanics. Pt then propels WC back to room with BUEs to work on endurance training. Left seated with all needs within reach.   Therapy Documentation Precautions:  Precautions Precautions: Fall,  Back Precaution Booklet Issued: Yes (comment) Recall of Precautions/Restrictions: Intact Precaution/Restrictions Comments: spinal precautions, no brace needed per orders Restrictions Weight Bearing Restrictions Per Provider Order: No   Therapy/Group: Individual Therapy  Elsie JAYSON Dawn, PT, DPT 12/15/2023, 4:34 PM

## 2023-12-15 NOTE — Progress Notes (Addendum)
 Physical Therapy Session Note  Patient Details  Name: Corey [name] MRN: [id number] Date of Birth: 11-20-1965  Today's Date: 12/15/2023 PT Individual Time: 9169-9054 PT Individual Time Calculation (min): 75 min   Short Term Goals: Week 1:  PT Short Term Goal 1 (Week 1): STG = LTG due to ELOS  Skilled Therapeutic Interventions/Progress Updates:    Pt up in w/c when PT arrived, agreeable for PT. Pt reports back pain, had his pain meds this AM.  Pt willing to ambulate to day gym with RW.  Transfer sit to stand with min assist, LLE will buckle upon initial ascent.  Gait with RW is slow and shuffled at times. Pt requires vc to keep head up and not looking down.  W/c->RW->mat transfer with CGA, again LLE begins to buckle, pt able to self correct and transfer safely. Supine mat ex's: bridging with knee adduction using towel 2x15 reps; again with dorsiflexion of feet x15,  2.5# ankle wt LLE KTC 3x10 with core engaged, LKFO with 1.5# above knee 3x10 Passive bil LE stretching to hamstrings, piriformis/glutes 2x30, PNF LLE D1D2, right sidelying left clamshells with 2.5# above knee 3x10, clamshells with slightly flexed LLE 3x10.  Sit to stand from mat to RW x15 emphasis on technique, core engagement (mat raised). Pt states his back is tight and requires rest break.  Standing with RW heel raises x20, rest.  Pt willing to ambulate back to room with RW, CGA, cueing to initiate heel to gait, ~x310 total distance.  Pt able to demonstrate improved stride length, decreased shuffling. Pt sat up in w/c at end of session with all items within reach.    Therapy Documentation Precautions:  Precautions Precautions: Fall, Back Precaution Booklet Issued: Yes (comment) Recall of Precautions/Restrictions: Intact Precaution/Restrictions Comments: spinal precautions, no brace needed per orders Restrictions Weight Bearing Restrictions Per Provider Order: No General:  Pain Assessment Pain Scale: 0-10 Pain Score:  3 Pain Location: Back Pain Intervention(s): Medication (See eMAR)     Therapy/Group: Individual Therapy  Arland GORMAN Fast 12/15/2023, 9:16 AM

## 2023-12-15 NOTE — Telephone Encounter (Signed)
 Patient Product/process development scientist completed.    The patient is insured through [employer]. Patient has [employer], may use a copay card, and/or apply for patient assistance if available.    Ran test claim for Eliquis 5 mg and the current 30 day co-pay is $75.00.   This test claim was processed through [facility]- copay amounts may vary at other pharmacies due to pharmacy/plan contracts, or as the patient moves through the different stages of their insurance plan.     Roland Earl, CPHT Pharmacy Technician III Certified Patient Advocate [facility] Patient Advocate Team Direct Number: [phone]  Fax: [phone]

## 2023-12-16 DIAGNOSIS — M4714 Other spondylosis with myelopathy, thoracic region: Secondary | ICD-10-CM | POA: Diagnosis not present

## 2023-12-16 MED ORDER — BACLOFEN 5 MG HALF TABLET: 5.0000 mg | ORAL_TABLET | Freq: Four times a day (QID) | ORAL | Status: DC | PRN

## 2023-12-16 NOTE — Progress Notes (Signed)
 PROGRESS NOTE   Subjective/Complaints:  Pt reports had a BM without sorbitol  or enema yesterday- Heme-occult was (-).  Feeling better He feels spasticity has made it hard to completely participate in exercises- tight mainly- not really spasms.   Was on saffron, beet root and tumeric- adding probiotics at home.    ROS:   Per HPI   Pt denies SOB, abd pain, CP, N/V/C/D, and vision changes     Objective:   No results found.  Recent Labs    12/15/23 0428  HGB 10.3*  HCT 31.5*   No results for input(s): NA, K, CL, CO2, GLUCOSE, BUN, CREATININE, CALCIUM  in the last 72 hours.   Intake/Output Summary (Last 24 hours) at 12/16/2023 0829 Last data filed at 12/16/2023 0650 Gross per 24 hour  Intake 720 ml  Output 3025 ml  Net -2305 ml        Physical Exam: Vital Signs Blood pressure (!) 148/97, pulse 80, temperature 98 F (36.7 C), resp. rate 18, height 5' 9 (1.753 m), weight 107.5 kg, SpO2 99%.        General: awake, alert, appropriate, sitting up again in w/c at bedside; watching TV;  NAD HENT: conjugate gaze; oropharynx moist CV: regular rate and rhythm; no JVD Pulmonary: CTA B/L; no W/R/R- good air movement GI: soft, NT, ND, (+)BS Psychiatric: appropriate-more interactive Neurological: Ox3 MAS of 2 in hips, knees and ankles ROM; 5-6 beats of clonus B/L LE's Ext: no clubbing, cyanosis, or edema Psych: pleasant and cooperative  Skin: C/D/I. Back incision cdi with staples. Drain site with scarce drainage MSK:      No apparent deformity. + L knee TKR scar. R knee extension slightly limited 0-5 degrees--still an issue. Therapy massaging Neurologic exam:  Alert and oriented x 3. Normal insight and awareness. Intact Memory. Normal language and speech. Cranial nerve exam unremarkable.    Sensation: To light touch intact in BL UEs and Les; notes sensory decrease in all bilateral  toes Reflexes: still 2+ in BL UE and LEs. Negative Hoffman's and babinski signs bilaterally.  CN: 2-12 grossly intact.  Coordination: No apparent tremors. + LLE ataxia with HTS; otherwise intact Spasticity: MAS 0 in all extremities.       Strength:                RUE: 5/5 SA, 5/5 EF, 5/5 EE, 5/5 WE, 5/5 FF, 5/5 FA                LUE:  5/5 SA, 5/5 EF, 5/5 EE, 5/5 WE, 5/5 FF, 5/5 FA                RLE: 4/5 HF, 5/5 KE, 5/5  DF, 5/5  EHL, 5/5  PF                 LLE:  4/5 HF, 4- to 4/5 KE, 5/5  DF, 5/5  EHL, 5/5  PF     Assessment/Plan: 1. Functional deficits which require 3+ hours per day of interdisciplinary therapy in a comprehensive inpatient rehab setting. Physiatrist is providing close team supervision and 24 hour management of active medical problems listed below. Physiatrist and rehab  team continue to assess barriers to discharge/monitor patient progress toward functional and medical goals  Care Tool:  Bathing    Body parts bathed by patient: Right arm, Left arm, Chest, Abdomen, Front perineal area, Buttocks, Right upper leg, Left upper leg, Face   Body parts bathed by helper: Right lower leg, Left lower leg     Bathing assist Assist Level: Minimal Assistance - Patient > 75%     Upper Body Dressing/Undressing Upper body dressing   What is the patient wearing?: Pull over shirt    Upper body assist Assist Level: Set up assist    Lower Body Dressing/Undressing Lower body dressing      What is the patient wearing?: Pants     Lower body assist Assist for lower body dressing: Contact Guard/Touching assist     Toileting Toileting    Toileting assist Assist for toileting: Minimal Assistance - Patient > 75%     Transfers Chair/bed transfer  Transfers assist     Chair/bed transfer assist level: Contact Guard/Touching assist     Locomotion Ambulation   Ambulation assist      Assist level: Contact Guard/Touching assist Assistive device: Walker-rolling Max  distance: 310'   Walk 10 feet activity   Assist     Assist level: Contact Guard/Touching assist Assistive device: Walker-rolling   Walk 50 feet activity   Assist    Assist level: Contact Guard/Touching assist Assistive device: Walker-rolling    Walk 150 feet activity   Assist    Assist level: Contact Guard/Touching assist Assistive device: Walker-rolling    Walk 10 feet on uneven surface  activity   Assist Walk 10 feet on uneven surfaces activity did not occur: Safety/medical concerns         Wheelchair     Assist Is the patient using a wheelchair?: Yes Type of Wheelchair: Manual    Wheelchair assist level: Supervision/Verbal cueing Max wheelchair distance: 122ft    Wheelchair 50 feet with 2 turns activity    Assist        Assist Level: Supervision/Verbal cueing   Wheelchair 150 feet activity     Assist      Assist Level: Supervision/Verbal cueing   Blood pressure (!) 148/97, pulse 80, temperature 98 F (36.7 C), resp. rate 18, height 5' 9 (1.753 m), weight 107.5 kg, SpO2 99%.   Medical Problem List and Plan: 1. Functional deficits secondary to thoracic myelopathy.  Status post T3 transpedicular decompression, T1-T4 PSF 12/07/2023 per Dr. Penne Sharps neurosurgery.NO BRACE REQUIRED.  As per neurosurgery postoperative drain to be removed 12/11/2022.  Wound VAC can be removed when loses suction or when the battery runs down.             -patient may shower now that VAC was removed             -ELOS/Goals: 10-12 days, Mod I PT/OT            D/c 7/21  Con't CIR PT and OT-spasticity is limiting pt's ability to participate- L knee will buckle intermittently 2.  Antithrombotics: -DVT/anticoagulation:  Pharmaceutical: Lovenox .  Check vascular study             -antiplatelet therapy: N/A              - 7/11: Admission duplex + DVT R proximal posterior tibial veins, R mid-peroneal veins, L posterior tibial veins, L peroneal veins, and  intramuscular thrombosis on R gastrocnemius veins. Rolan Haggard d/w Neurosurgery Dr. Theressa office, unable  to start full dose AC until 7d post-op. Recommend continuation of PPX dose lovenox  40 mg daily with repeat Duplex Monday to ensure no extension.              -- Later, messaged by Dr. Claudene requesting additional consult for IVC filter; order placed, d/w [facility] PA for follow-up in AM with IR for eval   7/12- spoke with IR_ will do IVC filter tomorrow- and will make him NPO- went back and spoke to pt about plan  7/14 IVCF placed yesterday without issues--start full a/c tomorrow??  7/15- will reach out to Dr Claudene to see when can start therapeutic [facility], considering has IVC filter. Has been 7 days today, but will see what he says  7/16- starting Eliquis  5 mg BID today- don't feel comfortable starting 10 mg BID  due to being 9 days out from surgery on spine, and risk of epidural hematoma- so since he has IVC filter, I think the benefits of 5 mg BID outweigh risks, but not the 10 mg BID- d/w pt and pharmacy. Still has R gastroc DVT, but LLE DVTs have resolved, of note.  3. Pain Management: Robaxin  and oxycodone  as needed  7/16- pain is manageable-  4. Mood/Behavior/Sleep: Paxil  20 mg daily             -antipsychotic agents:  5. Neuropsych/cognition: This patient is capable of making decisions on his own behalf. 6. Skin/Wound Care: Routine skin checks              - vac battery died. Removed. Dry dressing in place  -almost no drainage from former drain site---will change to foam dressings with perhaps a piece of gauze on drain site too  7/15- No significant drainage-  7. Fluids/Electrolytes/Nutrition: Routine in and outs with follow-up chemistries 8.  Constipation.  Colace 100 mg twice daily, MiraLAX  daily as needed             - LBM 7/12  7/15- LBM 7/12 after sorbitol - feels like could go today- will make Miralax  daily since still taking pain meds 2x/day and normally goes 1x/day at home.    7/16- no BM with Miralax - will order Sorbitol  after therapy at 4pm and Soap suds enema if doesn't have BM by 8pm.   7/17- had BM yesterday without Sorbitol  or enema. I'm glad he had BM without more extreme measures.  9.  OSA.  CPAP 10.  Hypertension.  Currently on no antihypertensive medications or prior to admission.  Monitor with increased mobility  7/12- 7/13- BP doing better- will con't to monitor with activity  7/17- BP running 120s usually systolic, however was 149/97 this AM- will con't to monitor trend  11. Anemia. 14->12->10 hgb, likely post-surgical. Monitor labs. Iron-Vitamin C -B12-FA supplement.     7/14 Hb 10.3-->9.9, has been trending down, likely post-op   -pt already on iron supp with vitamins above   -check stool OB   -follow up cbc wednesday  7/17- Hb up to 10.3- also hem-occult is (-) so that bodes well  12. Spasticity  7/17- pt's MAS is 2 in LE's with multiple beats of clonus- pt doesn't want meds scheduled- will add Baclofen  5 mg q6 hours prn for spasticity/tightness  -educated on spasticity- that CAN get worse for 1-2 years after injury- although doesn't always occur-and due to [facility] compression    Upon research on Up to date- Saffron CAN interact with Eliquis - so pt needs to stop Tumeric AND Saffron- but not  beet root- can also take probiotics at home. Went back to talk to pt about this.    I spent a total of 51   minutes on total care today- >50% coordination of care- due to  Education of pt about spasticity and meds/risk of side effects; as well as went back to d/w pt about his supplements.   LOS: 6 days A FACE TO FACE EVALUATION WAS PERFORMED  Aditri Louischarles 12/16/2023, 8:29 AM

## 2023-12-16 NOTE — Progress Notes (Addendum)
 Physical Therapy Session Note  Patient Details  Name: Corey [name] MRN: [id number] Date of Birth: [birth year]  Today's Date: 12/16/2023 PT Individual Time: 0915-1030 PT Individual Time Calculation (min): 75 min   Short Term Goals: Week 1:  PT Short Term Goal 1 (Week 1): STG = LTG due to ELOS  Skilled Therapeutic Interventions/Progress Updates: Patient seated in wc on entrance to room. Patient alert and agreeable to PT session.  Therapeutic Activity:  Split Squat Sit to Stand wit L foot on block - 3x8- minA to keep form correct with weight over L foot - Pt needed VC to maintain BW lateralized to the L. Pt needed vc to keep R foot place slightly forward to L foot.   Transfers: Pt performed sit<>stand and stand to sit transfers throughout session with CGA. Provided VC for standing perfectly erect before sitting.   Gait Training:  Pt ambulated ~273ft 1x65 (2x80 with L 4lb ankle weight) using RW with CGA. Pt demonstrated the following gait deviations with therapist providing the described cuing and facilitation for improvement: Pt left knee had increased spasticity today which results in less knee extension during stance phase. Pt reported feeling tighter than usual and that he also felt like his L leg felt difficult to coordinate advancement. Pt had visible tremors in his L leg during swing phase.  Pt also needed VC to keep left foot from lateralizing too far midline during initial contact.   Neuromuscular Re-ed: NMR facilitated during session with focus on single leg balance, weight shifting, stance phase mechanics, coordinating swing and retraction leg. - Weight shifting and pushing laterally - CGA - 2x15 - Stair stepping with weight shift - CGA to minA - 2x10  NMR performed for improvements in motor control and coordination, balance, sequencing, judgement, and self confidence/ efficacy in performing all aspects of mobility at highest level of independence.   Patient seated in wc at end  of session with brakes locked with all needs within reach.      Therapy Documentation Precautions:  Precautions Precautions: Fall, Back Precaution Booklet Issued: Yes (comment) Recall of Precautions/Restrictions: Intact Precaution/Restrictions Comments: spinal precautions, no brace needed per orders Restrictions Weight Bearing Restrictions Per Provider Order: No     Therapy/Group: Individual Therapy  Johnetta Cordon 12/16/2023, 12:58 PM

## 2023-12-16 NOTE — Progress Notes (Addendum)
 Patient ID: DAWIT TANKARD, male   DOB: May 09, 1966, 58 y.o.   MRN: [id number]  *SW received message from [facility] reporting [facility] Lawrance and only 30% service connected.   1202- SW returned call and left message for pt wife. SW waiting on follow-up.   SW faxed outpatient PT/OT referral to [facility] ([phone]/[phone]).  *SW spoke with pt wife to review discharge instructions, and family edu. She will be in tomorrow and understands therapists will review with her any concerns.   Graeme Jude, MSW, LCSW Office: [phone] Cell: [phone] Fax: [phone]

## 2023-12-16 NOTE — Progress Notes (Signed)
 Occupational Therapy Session Note  Patient Details  Name: Corey [name] MRN: [id number] Date of Birth: Jul 06, 1965  Today's Date: 12/16/2023 OT Individual Time: 8954-8844 OT Individual Time Calculation (min): 70 min    Short Term Goals: Week 1:  OT Short Term Goal 1 (Week 1): patient will complete toileting act with sup A OT Short Term Goal 2 (Week 1): patient will complete bathing at sink or shower CGA with AE and min verbal cues OT Short Term Goal 3 (Week 1): patient will complete UB/ LB dressing SUP with AE  Skilled Therapeutic Interventions/Progress Updates:    Skilled OT intervention with focus on sit<>stand, standing balance, functional amb with RW, bathing at shower level, dressing with sit<>stand, AE use, discharge planning, and activity tolerance to incrase independence with BADLs. All amb with RW at close supervision. Standing balance for LB dressing with close supervision. Pt used AE approptiately to LB bathing/dressing. Pt educated on use on long hand shoe horn. Min A to complete task. With practiced, pt should be able to master technique. Educated pt on home safety and energy conservation strategies. Pt remained in w/c with all needs within reach.   Therapy Documentation Precautions:  Precautions Precautions: Fall, Back Precaution Booklet Issued: Yes (comment) Recall of Precautions/Restrictions: Intact Precaution/Restrictions Comments: spinal precautions, no brace needed per orders Restrictions Weight Bearing Restrictions Per Provider Order: No   Pain: Pt c/o 8/10 back pain; meds requested from RN; repositioned, emotional support, education  Therapy/Group: Individual Therapy  Maritza Debby Mare 12/16/2023, 11:58 AM

## 2023-12-16 NOTE — Progress Notes (Signed)
 Physical Therapy Session Note  Patient Details  Name: Corey [name] MRN: [id number] Date of Birth: 1965/09/03   Today's Date: 12/16/2023 PT Individual Time: 1415-1530 PT Individual Time Calculation (min): 75 min   Short Term Goals: Week 1:  PT Short Term Goal 1 (Week 1): STG = LTG due to ELOS  Skilled Therapeutic Interventions/Progress Updates:    pt received in bed and agreeable to therapy. Pt reports pain controlled on medication at this time.   Pt participated in Sit to stand + march combo for warm up and to reduce stiffness before gait. Pt ambulated to gym with improved foot clearance and upright posture this PM.  Block practice with Sit to stand, progressed to Sit to stand with 3.3 lb ball. Facilitation into anterior lean for improved mechanics, 4 x 6.  Pt then performed step ups + march on 4 step with 3lb ankle weights for balance and glute strength. Cues for reducing anterior lean/UE reliance for improved independence with stair negotiation. Performed 3 x 10, 1 x 5 d/t time constraints.   Pt ambulated back to room, noted improved gait mechanics including longer stride. Pt then performed bed mobility with supervision and was left with all needs in reach and alarm active.   Therapy Documentation Precautions:  Precautions Precautions: Fall, Back Precaution Booklet Issued: Yes (comment) Recall of Precautions/Restrictions: Intact Precaution/Restrictions Comments: spinal precautions, no brace needed per orders Restrictions Weight Bearing Restrictions Per Provider Order: No General:   Vital Signs: Therapy Vitals Temp: 98.5 F (36.9 C) Temp Source: Oral Pulse Rate: 99 Resp: 18 BP: 123/78 Patient Position (if appropriate): Sitting Oxygen Therapy SpO2: 100 % O2 Device: Room Air Pain: Pain Assessment Pain Scale: 0-10 Pain Score: 5  Pain Location: Back Pain Intervention(s): Medication (See eMAR) Mobility:   Locomotion :    Trunk/Postural Assessment :     Balance:   Exercises:   Other Treatments:      Therapy/Group: Individual Therapy  Corey [name] 12/16/2023, 2:47 PM

## 2023-12-17 DIAGNOSIS — M4714 Other spondylosis with myelopathy, thoracic region: Secondary | ICD-10-CM | POA: Diagnosis not present

## 2023-12-17 MED ORDER — BACLOFEN 5 MG HALF TABLET
5.0000 mg | ORAL_TABLET | Freq: Four times a day (QID) | ORAL | Status: DC
  Administered 2023-12-17 – 2023-12-20 (×13): 5 mg via ORAL
  Filled 2023-12-17 (×14): qty 1

## 2023-12-17 NOTE — Progress Notes (Signed)
 Occupational Therapy Session Note  Patient Details  Name: Corey [name] MRN: [id number] Date of Birth: 02/15/1966  Session 1: Today's Date: 12/17/2023 OT Individual Time: 1004-1100 OT Individual Time Calculation (min): 56 min   Session 2: Today's Date: 12/17/2023 OT Individual Time: 8599-8554 OT Individual Time Calculation (min): 45 min   Short Term Goals: Week 1:  OT Short Term Goal 1 (Week 1): patient will complete toileting act with sup A OT Short Term Goal 2 (Week 1): patient will complete bathing at sink or shower CGA with AE and min verbal cues OT Short Term Goal 3 (Week 1): patient will complete UB/ LB dressing SUP with AE  Session 1: Skilled Therapeutic Interventions/Progress Updates:   Completed family training with patient spouse. Therapist educated family regarding toilet transfers, ADL, HEP, patient safety during mobility, patient binder,  and fall recovery techniques. Education also provided on strategies and compensatory techniques to use if patient has any falls, or difficulty with completing ADLs. Education provided for gait belt use and handling technique. Patient spouse completed hands on training for toilet transfers, functional mobility and chair transfer with therapist providing VC as needed for technique and form. All education completed and all questions     Session 2: Skilled Therapeutic Interventions/Progress Updates:  Patient agreeable to participate in OT session. Reports no pain level.   Patient participated in skilled OT session focusing on showering, functional mobility, transfers with least amount of assistance to maximize IND at home, increase safety, and decrease caregiver reliance. Patient able to complete ADL session with the following levels: Bathing- SUP Bathing transfer- SUP UB dressing- mod I LB dressing- SUP Footwear- max A ted hose Toilet transfer-. SUP to CG with ambulation RW  Patient returned to wc with all needs in reach, call light in  reach.   Therapy Documentation Precautions:  Precautions Precautions: Fall, Back Precaution Booklet Issued: Yes (comment) Recall of Precautions/Restrictions: Intact Precaution/Restrictions Comments: spinal precautions, no brace needed per orders Restrictions Weight Bearing Restrictions Per Provider Order: No   Therapy/Group: Individual Therapy  D'mariea L Tarini Carrier 12/17/2023, 7:57 AM

## 2023-12-17 NOTE — Progress Notes (Signed)
 Physical Therapy Session Note  Patient Details  Name: Corey [name] MRN: [id number] Date of Birth: 05/03/1966  Today's Date: 12/17/2023 PT Individual Time: 0800-0845 PT Individual Time Calculation (min): 45 min   Short Term Goals: Week 1:  PT Short Term Goal 1 (Week 1): STG = LTG due to ELOS  Skilled Therapeutic Interventions/Progress Updates:    Pt seated in w/c on arrival and agreeable to therapy. Pt reports 5/10 back pain, premedicated. Rest and positioning provided as needed. Pt ambulated with RW and CGA throughout session. Note increase in spasticity today limiting gait pattern which improved with mobility. Pt then navigated stairs x 12 with 2 hand rails and CGA. Pt performed multiple times while problem solving for no hand rails at home. Pt able to place RW on top step while ascending and placed hands on therapist's shoulders while descending. Pt reports his wife will be able to assist him at home until rails are installed. Pt returned to room and remained seated up in w/c, was left with all needs in reach and alarm active.   Therapy Documentation Precautions:  Precautions Precautions: Fall, Back Precaution Booklet Issued: Yes (comment) Recall of Precautions/Restrictions: Intact Precaution/Restrictions Comments: spinal precautions, no brace needed per orders Restrictions Weight Bearing Restrictions Per Provider Order: No General:       Therapy/Group: Individual Therapy  [name] 12/17/2023, 3:45 PM

## 2023-12-17 NOTE — Progress Notes (Signed)
 Physical Therapy Session Note  Patient Details  Name: Corey [name] MRN: [id number] Date of Birth: 07-20-1965  Today's Date: 12/17/2023 PT Individual Time: 1100-1200 PT Individual Time Calculation (min): 60 min   Short Term Goals: Week 1:  PT Short Term Goal 1 (Week 1): STG = LTG due to ELOS  Skilled Therapeutic Interventions/Progress Updates: Pt presented in w/c with wife present agreeable to therapy. Pt denies pain at rest. Session focused on family education with PTA educating wife that pt is to d/c at supervision/mod I level. Explained no physical assist soul be required and pt is on track to reach goals. Discussed use of RW, safety with transfers, and gait. Pt stood with CGA fading to close supervision throughout session with RW. Pt ambulated to ortho gym >340ft with CGA. Discussed car transfer with pt agreeing to use sedan vs SUV [facility]) due to height and maintaining spinal precautions. Pt completed car transfer at sedan height at supervision level. Participated in ambulation up/down ramp as well as on mulch for uneven surface. Pt then ambulated to main gym and discussed method for ascending/descending stairs upon d/c until rail is completed. Pt was able to ascend x 2 steps with B rails then RW set up at platform and pt ascended remaining x 2 stairs holding onto RW as practiced previously. Pt descended using x 2 rails then placed hands on PTA's shoulders for x 1 step to simulate home environment and to maintain spinal precautions with final step holding onto RW. Pt was able to complete this with PTA x 2 then completed a third time with wife. Discussed stretching program with pt stating already having HEP. 3lb weights placed on pt's BLE and ambulated ~188ft with RW and CGA for increased proprioceptive feedback. In day room weights removed and pt then ambulated back to room with wife guarding. Pt left in w/c at end of session with all questions/queries answered at current time, call bell  within reach and current needs met.      Therapy Documentation Precautions:  Precautions Precautions: Fall, Back Precaution Booklet Issued: Yes (comment) Recall of Precautions/Restrictions: Intact Precaution/Restrictions Comments: spinal precautions, no brace needed per orders Restrictions Weight Bearing Restrictions Per Provider Order: No General:   Vital Signs: Therapy Vitals Temp: 99.4 F (37.4 C) Temp Source: Oral Pulse Rate: 95 Resp: 19 BP: 104/62 Patient Position (if appropriate): Sitting Oxygen Therapy SpO2: 100 % O2 Device: Room Air  Therapy/Group: Individual Therapy  Karmen Altamirano 12/17/2023, 4:17 PM

## 2023-12-17 NOTE — Progress Notes (Signed)
 PROGRESS NOTE   Subjective/Complaints:  Pt reports didn't understand that needed to ask for Baclofen - his is willing to try and schedule Baclofen - so will do so.  LBM 2 days ago.  Was on saffron, beet root and tumeric- adding probiotics at home.    ROS:   Per HPI   Pt denies SOB, abd pain, CP, N/V/C/D, and vision changes      Objective:   No results found.  Recent Labs    12/15/23 0428  HGB 10.3*  HCT 31.5*   No results for input(s): NA, K, CL, CO2, GLUCOSE, BUN, CREATININE, CALCIUM  in the last 72 hours.   Intake/Output Summary (Last 24 hours) at 12/17/2023 0831 Last data filed at 12/17/2023 0700 Gross per 24 hour  Intake 720 ml  Output 2125 ml  Net -1405 ml        Physical Exam: Vital Signs Blood pressure 115/68, pulse 93, temperature 98.1 F (36.7 C), resp. rate 19, height 5' 9 (1.753 m), weight 107.5 kg, SpO2 97%.         General: awake, alert, appropriate, sitting up in w/c at bedside; 100% tray eaten; NAD HENT: conjugate gaze; oropharynx moist CV: regular rate and rhythm; no JVD Pulmonary: CTA B/L; no W/R/R- good air movement GI: soft, NT, ND, (+)BS Psychiatric: appropriate Neurological: Ox3  MAS of 2 in hips, knees and ankles ROM; 5-6 beats of clonus B/L LE's Ext: no clubbing, cyanosis, or edema Psych: pleasant and cooperative  Skin: C/D/I. Back incision cdi with staples. Drain site with scarce drainage MSK:      No apparent deformity. + L knee TKR scar. R knee extension slightly limited 0-5 degrees--still an issue. Therapy massaging Neurologic exam:  Alert and oriented x 3. Normal insight and awareness. Intact Memory. Normal language and speech. Cranial nerve exam unremarkable.    Sensation: To light touch intact in BL UEs and Les; notes sensory decrease in all bilateral toes Reflexes: still 2+ in BL UE and LEs. Negative Hoffman's and babinski signs bilaterally.   CN: 2-12 grossly intact.  Coordination: No apparent tremors. + LLE ataxia with HTS; otherwise intact Spasticity: MAS 0 in all extremities.       Strength:                RUE: 5/5 SA, 5/5 EF, 5/5 EE, 5/5 WE, 5/5 FF, 5/5 FA                LUE:  5/5 SA, 5/5 EF, 5/5 EE, 5/5 WE, 5/5 FF, 5/5 FA                RLE: 4/5 HF, 5/5 KE, 5/5  DF, 5/5  EHL, 5/5  PF                 LLE:  4/5 HF, 4- to 4/5 KE, 5/5  DF, 5/5  EHL, 5/5  PF     Assessment/Plan: 1. Functional deficits which require 3+ hours per day of interdisciplinary therapy in a comprehensive inpatient rehab setting. Physiatrist is providing close team supervision and 24 hour management of active medical problems listed below. Physiatrist and rehab team continue to assess barriers to discharge/monitor  patient progress toward functional and medical goals  Care Tool:  Bathing    Body parts bathed by patient: Right arm, Left arm, Chest, Abdomen, Front perineal area, Buttocks, Right upper leg, Left upper leg, Face, Left lower leg, Right lower leg   Body parts bathed by helper: Right lower leg, Left lower leg     Bathing assist Assist Level: Supervision/Verbal cueing     Upper Body Dressing/Undressing Upper body dressing   What is the patient wearing?: Pull over shirt    Upper body assist Assist Level: Independent    Lower Body Dressing/Undressing Lower body dressing      What is the patient wearing?: Pants     Lower body assist Assist for lower body dressing: Supervision/Verbal cueing     Toileting Toileting    Toileting assist Assist for toileting: Minimal Assistance - Patient > 75%     Transfers Chair/bed transfer  Transfers assist     Chair/bed transfer assist level: Contact Guard/Touching assist     Locomotion Ambulation   Ambulation assist      Assist level: Contact Guard/Touching assist Assistive device: Walker-rolling Max distance: 310'   Walk 10 feet activity   Assist     Assist level:  Contact Guard/Touching assist Assistive device: Walker-rolling   Walk 50 feet activity   Assist    Assist level: Contact Guard/Touching assist Assistive device: Walker-rolling    Walk 150 feet activity   Assist    Assist level: Contact Guard/Touching assist Assistive device: Walker-rolling    Walk 10 feet on uneven surface  activity   Assist Walk 10 feet on uneven surfaces activity did not occur: Safety/medical concerns         Wheelchair     Assist Is the patient using a wheelchair?: Yes Type of Wheelchair: Manual    Wheelchair assist level: Supervision/Verbal cueing Max wheelchair distance: 130ft    Wheelchair 50 feet with 2 turns activity    Assist        Assist Level: Supervision/Verbal cueing   Wheelchair 150 feet activity     Assist      Assist Level: Supervision/Verbal cueing   Blood pressure 115/68, pulse 93, temperature 98.1 F (36.7 C), resp. rate 19, height 5' 9 (1.753 m), weight 107.5 kg, SpO2 97%.   Medical Problem List and Plan: 1. Functional deficits secondary to thoracic myelopathy.  Status post T3 transpedicular decompression, T1-T4 PSF 12/07/2023 per Dr. Penne Sharps neurosurgery.NO BRACE REQUIRED.  As per neurosurgery postoperative drain to be removed 12/11/2022.  Wound VAC can be removed when loses suction or when the battery runs down.             -patient may shower now that VAC was removed             -ELOS/Goals: 10-12 days, Mod I PT/OT            D/c 7/21  Con't CIR PT and OT- d/c Monday -spasticity is limiting pt's ability to participate- L knee will buckle intermittently 2.  Antithrombotics: -DVT/anticoagulation:  Pharmaceutical: Lovenox .  Check vascular study             -antiplatelet therapy: N/A              - 7/11: Admission duplex + DVT R proximal posterior tibial veins, R mid-peroneal veins, L posterior tibial veins, L peroneal veins, and intramuscular thrombosis on R gastrocnemius veins. Rolan Haggard d/w  Neurosurgery Dr. Theressa office, unable to start full dose AC until  7d post-op. Recommend continuation of PPX dose lovenox  40 mg daily with repeat Duplex Monday to ensure no extension.              -- Later, messaged by Dr. Claudene requesting additional consult for IVC filter; order placed, d/w [facility] PA for follow-up in AM with IR for eval   7/12- spoke with IR_ will do IVC filter tomorrow- and will make him NPO- went back and spoke to pt about plan  7/14 IVCF placed yesterday without issues--start full a/c tomorrow??  7/15- will reach out to Dr Claudene to see when can start therapeutic [facility], considering has IVC filter. Has been 7 days today, but will see what he says  7/16- starting Eliquis  5 mg BID today- don't feel comfortable starting 10 mg BID  due to being 9 days out from surgery on spine, and risk of epidural hematoma- so since he has IVC filter, I think the benefits of 5 mg BID outweigh risks, but not the 10 mg BID- d/w pt and pharmacy. Still has R gastroc DVT, but LLE DVTs have resolved, of note.   7/18- will recheck labs Saturday before d/c to make sure no drop in Hb 3. Pain Management: Robaxin  and oxycodone  as needed  7/16- pain is manageable-  4. Mood/Behavior/Sleep: Paxil  20 mg daily             -antipsychotic agents:  5. Neuropsych/cognition: This patient is capable of making decisions on his own behalf. 6. Skin/Wound Care: Routine skin checks              - vac battery died. Removed. Dry dressing in place  -almost no drainage from former drain site---will change to foam dressings with perhaps a piece of gauze on drain site too  7/15- No significant drainage-  7. Fluids/Electrolytes/Nutrition: Routine in and outs with follow-up chemistries 8.  Constipation.  Colace 100 mg twice daily, MiraLAX  daily as needed             - LBM 7/12  7/15- LBM 7/12 after sorbitol - feels like could go today- will make Miralax  daily since still taking pain meds 2x/day and normally goes 1x/day at  home.   7/16- no BM with Miralax - will order Sorbitol  after therapy at 4pm and Soap suds enema if doesn't have BM by 8pm.   7/17- had BM yesterday without Sorbitol  or enema. I'm glad he had BM without more extreme measures.  9.  OSA.  CPAP 10.  Hypertension.  Currently on no antihypertensive medications or prior to admission.  Monitor with increased mobility  7/12- 7/13- BP doing better- will con't to monitor with activity  7/17- BP running 120s usually systolic, however was 149/97 this AM- will con't to monitor trend  7/18- looking good- con't regimen  11. Anemia. 14->12->10 hgb, likely post-surgical. Monitor labs. Iron-Vitamin C -B12-FA supplement.     7/14 Hb 10.3-->9.9, has been trending down, likely post-op   -pt already on iron supp with vitamins above   -check stool OB   -follow up cbc wednesday  7/17- Hb up to 10.3- also hem-occult is (-) so that bodes well  12. Spasticity  7/17- pt's MAS is 2 in LE's with multiple beats of clonus- pt doesn't want meds scheduled- will add Baclofen  5 mg q6 hours prn for spasticity/tightness  -educated on spasticity- that CAN get worse for 1-2 years after injury- although doesn't always occur-and due to [facility] compression   7/18- we decided to just schedule the  Baclofen  5 mg QID ordered since didn't realize to ask for it yesterday  Upon research on Up to date- Saffron CAN interact with Eliquis - so pt needs to stop Tumeric AND Saffron- but not beet root- can also take probiotics at home. Went back to talk to pt about this.    I spent a total of 35   minutes on total care today- >50% coordination of care- due to  We discussed options about Baclofen - decided to schedule  LOS: 7 days A FACE TO FACE EVALUATION WAS PERFORMED  Corey [name] 12/17/2023, 8:31 AM

## 2023-12-18 ENCOUNTER — Other Ambulatory Visit: Payer: Self-pay

## 2023-12-18 ENCOUNTER — Encounter (HOSPITAL_COMMUNITY): Payer: Self-pay | Admitting: Physical Medicine and Rehabilitation

## 2023-12-18 LAB — CBC WITH DIFFERENTIAL/PLATELET
Abs Immature Granulocytes: 0.03 K/uL (ref 0.00–0.07)
Basophils Absolute: 0 K/uL (ref 0.0–0.1)
Basophils Relative: 1 %
Eosinophils Absolute: 0.2 K/uL (ref 0.0–0.5)
Eosinophils Relative: 3 %
HCT: 32.9 % — ABNORMAL LOW (ref 39.0–52.0)
Hemoglobin: 10.6 g/dL — ABNORMAL LOW (ref 13.0–17.0)
Immature Granulocytes: 0 %
Lymphocytes Relative: 29 %
Lymphs Abs: 2 K/uL (ref 0.7–4.0)
MCH: 27.5 pg (ref 26.0–34.0)
MCHC: 32.2 g/dL (ref 30.0–36.0)
MCV: 85.2 fL (ref 80.0–100.0)
Monocytes Absolute: 0.6 K/uL (ref 0.1–1.0)
Monocytes Relative: 9 %
Neutro Abs: 4 K/uL (ref 1.7–7.7)
Neutrophils Relative %: 58 %
Platelets: 416 K/uL — ABNORMAL HIGH (ref 150–400)
RBC: 3.86 MIL/uL — ABNORMAL LOW (ref 4.22–5.81)
RDW: 13.8 % (ref 11.5–15.5)
WBC: 6.8 K/uL (ref 4.0–10.5)
nRBC: 0 % (ref 0.0–0.2)

## 2023-12-18 NOTE — Progress Notes (Signed)
 PROGRESS NOTE   Subjective/Complaints:  No issues overnite , states bowel and bladder function is good   ROS:   Per HPI   Pt denies SOB, abd pain, CP, N/V/C/D, and vision changes  Objective:   No results found.  Recent Labs    12/18/23 0548  WBC 6.8  HGB 10.6*  HCT 32.9*  PLT 416*   No results for input(s): NA, K, CL, CO2, GLUCOSE, BUN, CREATININE, CALCIUM  in the last 72 hours.   Intake/Output Summary (Last 24 hours) at 12/18/2023 1104 Last data filed at 12/18/2023 0917 Gross per 24 hour  Intake 600 ml  Output 1900 ml  Net -1300 ml        Physical Exam: Vital Signs Blood pressure 104/72, pulse 77, temperature 98.9 F (37.2 C), temperature source Oral, resp. rate 18, height 5' 9 (1.753 m), weight 107.5 kg, SpO2 100%.  General: No acute distress Mood and affect are appropriate Heart: Regular rate and rhythm no rubs murmurs or extra sounds Lungs: Clear to auscultation, breathing unlabored, no rales or wheezes Abdomen: Positive bowel sounds, soft nontender to palpation, nondistended Extremities: No clubbing, cyanosis, or edema Skin: No evidence of breakdown, no evidence of rash   MAS of 2 in hips, knees and ankles ROM; 5-6 beats of clonus B/L LE's Ext: no clubbing, cyanosis, or edema Psych: pleasant and cooperative  Skin: C/D/I. Back incision cdi with staples. Drain site with scarce drainage MSK:      No apparent deformity. + L knee TKR scar. R knee extension slightly limited 0-5 degrees--still an issue. Therapy massaging Neurologic exam:  Alert and oriented x 3. Normal insight and awareness. Intact Memory. Normal language and speech. Cranial nerve exam unremarkable.    Sensation: To light touch intact in BL UEs and Les; notes sensory decrease in all bilateral toes Reflexes: still 2+ in BL UE and LEs. Negative Hoffman's and babinski signs bilaterally.  CN: 2-12 grossly intact.   Coordination: No apparent tremors. + LLE ataxia with HTS; otherwise intact Spasticity: MAS 0 in all extremities.       Strength:                RUE: 5/5 SA, 5/5 EF, 5/5 EE, 5/5 WE, 5/5 FF, 5/5 FA                LUE:  5/5 SA, 5/5 EF, 5/5 EE, 5/5 WE, 5/5 FF, 5/5 FA                RLE: 4/5 HF, 5/5 KE, 5/5  DF, 5/5  EHL, 5/5  PF                 LLE:  4/5 HF, 4- to 4/5 KE, 5/5  DF, 5/5  EHL, 5/5  PF     Assessment/Plan: 1. Functional deficits which require 3+ hours per day of interdisciplinary therapy in a comprehensive inpatient rehab setting. Physiatrist is providing close team supervision and 24 hour management of active medical problems listed below. Physiatrist and rehab team continue to assess barriers to discharge/monitor patient progress toward functional and medical goals  Care Tool:  Bathing    Body parts bathed by  patient: Right arm, Left arm, Chest, Abdomen, Front perineal area, Buttocks, Right upper leg, Left upper leg, Face, Left lower leg, Right lower leg   Body parts bathed by helper: Right lower leg, Left lower leg     Bathing assist Assist Level: Supervision/Verbal cueing     Upper Body Dressing/Undressing Upper body dressing   What is the patient wearing?: Pull over shirt    Upper body assist Assist Level: Independent    Lower Body Dressing/Undressing Lower body dressing      What is the patient wearing?: Pants     Lower body assist Assist for lower body dressing: Supervision/Verbal cueing     Toileting Toileting    Toileting assist Assist for toileting: Minimal Assistance - Patient > 75%     Transfers Chair/bed transfer  Transfers assist     Chair/bed transfer assist level: Contact Guard/Touching assist     Locomotion Ambulation   Ambulation assist      Assist level: Contact Guard/Touching assist Assistive device: Walker-rolling Max distance: 310'   Walk 10 feet activity   Assist     Assist level: Contact Guard/Touching  assist Assistive device: Walker-rolling   Walk 50 feet activity   Assist    Assist level: Contact Guard/Touching assist Assistive device: Walker-rolling    Walk 150 feet activity   Assist    Assist level: Contact Guard/Touching assist Assistive device: Walker-rolling    Walk 10 feet on uneven surface  activity   Assist Walk 10 feet on uneven surfaces activity did not occur: Safety/medical concerns         Wheelchair     Assist Is the patient using a wheelchair?: Yes Type of Wheelchair: Manual    Wheelchair assist level: Supervision/Verbal cueing Max wheelchair distance: 121ft    Wheelchair 50 feet with 2 turns activity    Assist        Assist Level: Supervision/Verbal cueing   Wheelchair 150 feet activity     Assist      Assist Level: Supervision/Verbal cueing   Blood pressure 104/72, pulse 77, temperature 98.9 F (37.2 C), temperature source Oral, resp. rate 18, height 5' 9 (1.753 m), weight 107.5 kg, SpO2 100%.   Medical Problem List and Plan: 1. Functional deficits secondary to thoracic myelopathy.  Status post T3 transpedicular decompression, T1-T4 PSF 12/07/2023 per Dr. Penne Sharps neurosurgery.NO BRACE REQUIRED.  As per neurosurgery postoperative drain to be removed 12/11/2022.  Wound VAC can be removed when loses suction or when the battery runs down.             -patient may shower now that VAC was removed             -ELOS/Goals: 10-12 days, Mod I PT/OT            D/c 7/21  Con't CIR PT and OT- d/c Monday -spasticity is limiting pt's ability to participate- L knee will buckle intermittently 2.  Antithrombotics: -DVT/anticoagulation:  Pharmaceutical: Lovenox .  Check vascular study             -antiplatelet therapy: N/A              - 7/11: Admission duplex + DVT R proximal posterior tibial veins, R mid-peroneal veins, L posterior tibial veins, L peroneal veins, and intramuscular thrombosis on R gastrocnemius veins. Rolan Haggard  d/w Neurosurgery Dr. Theressa office, unable to start full dose AC until 7d post-op. Recommend continuation of PPX dose lovenox  40 mg daily with repeat Duplex Monday to  ensure no extension.              -- Later, messaged by Dr. Claudene requesting additional consult for IVC filter; order placed, d/w [facility] for follow-up in AM with IR for eval   7/12- spoke with IR_ will do IVC filter tomorrow- and will make him NPO- went back and spoke to pt about plan  7/14 IVCF placed yesterday without issues--start full a/c tomorrow??  7/15- will reach out to Dr Claudene to see when can start therapeutic [facility], considering has IVC filter. Has been 7 days today, but will see what he says  7/16- starting Eliquis  5 mg BID today- don't feel comfortable starting 10 mg BID  due to being 9 days out from surgery on spine, and risk of epidural hematoma- so since he has IVC filter, I think the benefits of 5 mg BID outweigh risks, but not the 10 mg BID- d/w pt and pharmacy. Still has R gastroc DVT, but LLE DVTs have resolved, of note.   7/18- will recheck labs Saturday before d/c to make sure no drop in Hb 3. Pain Management: Robaxin  and oxycodone  as needed  7/16- pain is manageable-  4. Mood/Behavior/Sleep: Paxil  20 mg daily             -antipsychotic agents:  5. Neuropsych/cognition: This patient is capable of making decisions on his own behalf. 6. Skin/Wound Care: Routine skin checks              - vac battery died. Removed. Dry dressing in place  -almost no drainage from former drain site---will change to foam dressings with perhaps a piece of gauze on drain site too  7/15- No significant drainage-  7. Fluids/Electrolytes/Nutrition: Routine in and outs with follow-up chemistries 8.  Constipation.  Colace 100 mg twice daily, MiraLAX  daily as needed             - LBM 7/12  7/15- LBM 7/12 after sorbitol - feels like could go today- will make Miralax  daily since still taking pain meds 2x/day and normally goes 1x/day at  home.   7/16- no BM with Miralax - will order Sorbitol  after therapy at 4pm and Soap suds enema if doesn't have BM by 8pm.   7/17- had BM yesterday without Sorbitol  or enema. I'm glad he had BM without more extreme measures.  9.  OSA.  CPAP 10.  Hypertension.  Currently on no antihypertensive medications or prior to admission.  Monitor with increased mobility  7/12- 7/13- BP doing better- will con't to monitor with activity  7/17- BP running 120s usually systolic, however was 149/97 this AM- will con't to monitor trend  7/18- looking good- con't regimen  11. Anemia. 14->12->10 hgb, likely post-surgical. Monitor labs. Iron-Vitamin C -B12-FA supplement.       -pt already on iron supp with vitamins above       Latest Ref Rng & Units 12/18/2023    5:48 AM 12/15/2023    4:28 AM 12/13/2023    5:10 AM  CBC  WBC 4.0 - 10.5 K/uL 6.8   8.7   Hemoglobin 13.0 - 17.0 g/dL 89.3  89.6  9.9   Hematocrit 39.0 - 52.0 % 32.9  31.5  30.5   Platelets 150 - 400 K/uL 416   233    Stable 7/19  7/17- Hb up to 10.3- also hem-occult is (-) so that bodes well  12. Spasticity  7/17- pt's MAS is 2 in LE's with multiple beats of clonus-  pt doesn't want meds scheduled- will add Baclofen  5 mg q6 hours prn for spasticity/tightness  -educated on spasticity- that CAN get worse for 1-2 years after injury- although doesn't always occur-and due to [facility] compression   7/18- we decided to just schedule the Baclofen  5 mg QID ordered since didn't realize to ask for it yesterday  Upon research on Up to date- Saffron CAN interact with Eliquis - so pt needs to stop Tumeric AND Saffron- but not beet root- can also take probiotics at home. Went back to talk to pt about this.     LOS: 8 days A FACE TO FACE EVALUATION WAS PERFORMED  Corey [name] 12/18/2023, 11:04 AM

## 2023-12-18 NOTE — Progress Notes (Signed)
 Physical Therapy Discharge Summary  Patient Details  Name: Corey [name] MRN: [id number] Date of Birth: 1965/08/22  Date of Discharge from PT service:December 19, 2023  {CHL IP REHAB PT TIME CALCULATION:304800500}   Patient has met {NUMBERS 0-12:18577} of 8 long term goals due to improved activity tolerance, improved balance, improved postural control, increased strength, increased range of motion, and decreased pain.  Patient to discharge at an ambulatory level Modified Independent.   Patient's care partner is independent to provide the necessary physical assistance at discharge. Pt to d/c home with his wife who has undergone family education   Reasons goals not met: ***  Recommendation:  Patient will benefit from ongoing skilled PT services in outpatient setting to continue to advance safe functional mobility, address ongoing impairments in Strength, ROM, spasticity management, and minimize fall risk.  Equipment: No equipment provided  Reasons for discharge: treatment goals met and discharge from hospital  Patient/family agrees with progress made and goals achieved: Yes  PT Discharge Precautions/Restrictions   Vital Signs   Pain Pain Assessment Pain Scale: 0-10 Pain Score: 7  Pain Location: Back Pain Intervention(s): Medication (See eMAR) Pain Interference   Vision/Perception     Cognition   Sensation   Motor     Mobility   Locomotion     Trunk/Postural Assessment     Balance   Extremity Assessment            [name] 12/18/2023, 3:44 PM

## 2023-12-18 NOTE — Progress Notes (Signed)
 Physical Therapy Session Note  Patient Details  Name: Corey [name] MRN: [id number] Date of Birth: 08/27/1965  Today's Date: 12/18/2023 PT Individual Time: 8597-8484 PT Individual Time Calculation (min): 73 min   Short Term Goals: Week 1:  PT Short Term Goal 1 (Week 1): STG = LTG due to ELOS  Skilled Therapeutic Interventions/Progress Updates:    Pt seated in w/c on arrival and agreeable to therapy. Pt reports pain controlled at this time. Pt ambulated with RW and supervision throughout session, approaching mod I level after physical activity and stretching. Pt performed self stretching with strap for hamstrings and knee flexion. Pt then instructed in and performed the following HEP. Provided education in frequency and intensity. Pt returned to room after session and was left with all needs in reach and alarm active.   Access Code: [email or web address] URL: [email or web address] Date: 12/18/2023 Prepared by: Schuyler Batter  Exercises - Supine Hamstring Stretch with Strap  - 1 x daily - 7 x weekly - 3 sets - 10 reps - Supine Heel Slide with Strap  - 1 x daily - 7 x weekly - 3 sets - 10 reps - Hooklying Clamshell with Resistance  - 1 x daily - 7 x weekly - 4 sets - 15 reps - Supine 90/90 Alternating Toe Touch  - 1 x daily - 7 x weekly - 3 sets - 10 reps - Dead Bug  - 1 x daily - 7 x weekly - 3 sets - 10 reps - Mini Squats with Walker and Chair  - 1 x daily - 7 x weekly - 4 sets - 10 reps - Side Stepping with Resistance at Ankles and Counter Support  - 1 x daily - 7 x weekly - 3 sets - 10 reps  Therapy Documentation Precautions:  Precautions Precautions: Fall, Back Precaution Booklet Issued: Yes (comment) Recall of Precautions/Restrictions: Intact Precaution/Restrictions Comments: spinal precautions, no brace needed per orders Restrictions Weight Bearing Restrictions Per Provider Order: No General:       Therapy/Group: Individual Therapy  [name] 12/18/2023,  3:19 PM

## 2023-12-19 DIAGNOSIS — M4714 Other spondylosis with myelopathy, thoracic region: Secondary | ICD-10-CM | POA: Diagnosis not present

## 2023-12-19 NOTE — Progress Notes (Signed)
 PROGRESS NOTE   Subjective/Complaints:  No issues overnite , states bowel and bladder function is good  Pt is denies pain  ROS:   Per HPI   Pt denies SOB, abd pain, CP, N/V/C/D, and vision changes  Objective:   No results found.  Recent Labs    12/18/23 0548  WBC 6.8  HGB 10.6*  HCT 32.9*  PLT 416*   No results for input(s): NA, K, CL, CO2, GLUCOSE, BUN, CREATININE, CALCIUM  in the last 72 hours.   Intake/Output Summary (Last 24 hours) at 12/19/2023 1135 Last data filed at 12/19/2023 0751 Gross per 24 hour  Intake 708 ml  Output 1450 ml  Net -742 ml        Physical Exam: Vital Signs Blood pressure 109/74, pulse 85, temperature 99.2 F (37.3 C), temperature source Oral, resp. rate 17, height 5' 9 (1.753 m), weight 107.5 kg, SpO2 98%.  General: No acute distress Mood and affect are appropriate Heart: Regular rate and rhythm no rubs murmurs or extra sounds Lungs: Clear to auscultation, breathing unlabored, no rales or wheezes Abdomen: Positive bowel sounds, soft nontender to palpation, nondistended Extremities: No clubbing, cyanosis, or edema Skin: No evidence of breakdown, no evidence of rash   MAS of 2 in hips, knees and ankles ROM; 5-6 beats of clonus B/L LE's Ext: no clubbing, cyanosis, or edema Psych: pleasant and cooperative  Skin: C/D/I. Back incision cdi with staples. Drain site with scarce drainage MSK:      No apparent deformity. + L knee TKR scar. R knee extension slightly limited 0-5 degrees--still an issue. Therapy massaging Neurologic exam:  Alert and oriented x 3. Normal insight and awareness. Intact Memory. Normal language and speech. Cranial nerve exam unremarkable.    Sensation: To light touch intact in BL UEs and Les; notes sensory decrease in all bilateral toes Reflexes: still 2+ in BL UE and LEs. Negative Hoffman's and babinski signs bilaterally.  CN: 2-12 grossly  intact.  Coordination: No apparent tremors. + LLE ataxia with HTS; otherwise intact Spasticity: MAS 0 in all extremities.       Strength:                RUE: 5/5 SA, 5/5 EF, 5/5 EE, 5/5 WE, 5/5 FF, 5/5 FA                LUE:  5/5 SA, 5/5 EF, 5/5 EE, 5/5 WE, 5/5 FF, 5/5 FA                RLE: 4/5 HF, 5/5 KE, 5/5  DF, 5/5  EHL, 5/5  PF                 LLE:  4/5 HF, 4- to 4/5 KE, 5/5  DF, 5/5  EHL, 5/5  PF     Assessment/Plan: 1. Functional deficits which require 3+ hours per day of interdisciplinary therapy in a comprehensive inpatient rehab setting. Physiatrist is providing close team supervision and 24 hour management of active medical problems listed below. Physiatrist and rehab team continue to assess barriers to discharge/monitor patient progress toward functional and medical goals  Care Tool:  Bathing  Body parts bathed by patient: Right arm, Left arm, Chest, Abdomen, Front perineal area, Buttocks, Right upper leg, Left upper leg, Face, Left lower leg, Right lower leg   Body parts bathed by helper: Right lower leg, Left lower leg     Bathing assist Assist Level: Supervision/Verbal cueing     Upper Body Dressing/Undressing Upper body dressing   What is the patient wearing?: Pull over shirt    Upper body assist Assist Level: Independent    Lower Body Dressing/Undressing Lower body dressing      What is the patient wearing?: Pants     Lower body assist Assist for lower body dressing: Supervision/Verbal cueing     Toileting Toileting    Toileting assist Assist for toileting: Minimal Assistance - Patient > 75%     Transfers Chair/bed transfer  Transfers assist     Chair/bed transfer assist level: Independent with assistive device     Locomotion Ambulation   Ambulation assist      Assist level: Independent with assistive device Assistive device: Walker-rolling Max distance: 262ft   Walk 10 feet activity   Assist     Assist level: Independent  with assistive device Assistive device: Walker-rolling   Walk 50 feet activity   Assist    Assist level: Independent with assistive device Assistive device: Walker-rolling    Walk 150 feet activity   Assist    Assist level: Independent with assistive device Assistive device: Walker-rolling    Walk 10 feet on uneven surface  activity   Assist Walk 10 feet on uneven surfaces activity did not occur: Safety/medical concerns   Assist level: Supervision/Verbal cueing Assistive device: Walker-rolling   Wheelchair     Assist Is the patient using a wheelchair?: Yes Type of Wheelchair: Manual    Wheelchair assist level: Independent Max wheelchair distance: 151ft    Wheelchair 50 feet with 2 turns activity    Assist        Assist Level: Independent   Wheelchair 150 feet activity     Assist      Assist Level: Independent   Blood pressure 109/74, pulse 85, temperature 99.2 F (37.3 C), temperature source Oral, resp. rate 17, height 5' 9 (1.753 m), weight 107.5 kg, SpO2 98%.   Medical Problem List and Plan: 1. Functional deficits secondary to thoracic myelopathy.  Status post T3 transpedicular decompression, T1-T4 PSF 12/07/2023 per Dr. Penne Sharps neurosurgery.NO BRACE REQUIRED.  As per neurosurgery postoperative drain to be removed 12/11/2022.  Wound VAC can be removed when loses suction or when the battery runs down.             -patient may shower now that VAC was removed             -ELOS/Goals: 10-12 days, Mod I PT/OT            D/c 7/21  Con't CIR PT and OT- d/c Monday -spasticity is limiting pt's ability to participate- L knee will buckle intermittently 2.  Antithrombotics: -DVT/anticoagulation:  Pharmaceutical: Lovenox .  Check vascular study             -antiplatelet therapy: N/A              - 7/11: Admission duplex + DVT R proximal posterior tibial veins, R mid-peroneal veins, L posterior tibial veins, L peroneal veins, and intramuscular  thrombosis on R gastrocnemius veins. Rolan Haggard d/w Neurosurgery Dr. Theressa office, unable to start full dose AC until 7d post-op. Recommend continuation of PPX dose  lovenox  40 mg daily with repeat Duplex Monday to ensure no extension.              -- Later, messaged by Dr. Claudene requesting additional consult for IVC filter; order placed, d/w [facility] for follow-up in AM with IR for eval   7/12- spoke with IR_ will do IVC filter tomorrow- and will make him NPO- went back and spoke to pt about plan  7/14 IVCF placed yesterday without issues--start full a/c tomorrow??  7/15- will reach out to Dr Claudene to see when can start therapeutic [facility], considering has IVC filter. Has been 7 days today, but will see what he says  7/16- starting Eliquis  5 mg BID today- don't feel comfortable starting 10 mg BID  due to being 9 days out from surgery on spine, and risk of epidural hematoma- so since he has IVC filter, I think the benefits of 5 mg BID outweigh risks, but not the 10 mg BID- d/w pt and pharmacy. Still has R gastroc DVT, but LLE DVTs have resolved, of note.   7/18- will recheck labs Saturday before d/c to make sure no drop in Hb 3. Pain Management: Robaxin  and oxycodone  as needed  7/16- pain is manageable-  4. Mood/Behavior/Sleep: Paxil  20 mg daily             -antipsychotic agents:  5. Neuropsych/cognition: This patient is capable of making decisions on his own behalf. 6. Skin/Wound Care: Routine skin checks              - vac battery died. Removed. Dry dressing in place  -almost no drainage from former drain site---will change to foam dressings with perhaps a piece of gauze on drain site too  7/15- No significant drainage-  7. Fluids/Electrolytes/Nutrition: Routine in and outs with follow-up chemistries 8.  Constipation.  Colace 100 mg twice daily, MiraLAX  daily as needed             - LBM 7/12  7/15- LBM 7/12 after sorbitol - feels like could go today- will make Miralax  daily since still  taking pain meds 2x/day and normally goes 1x/day at home.   7/16- no BM with Miralax - will order Sorbitol  after therapy at 4pm and Soap suds enema if doesn't have BM by 8pm.   7/17- had BM yesterday without Sorbitol  or enema. I'm glad he had BM without more extreme measures.  9.  OSA.  CPAP 10.  Hypertension.  Currently on no antihypertensive medications or prior to admission.  Monitor with increased mobility  7/12- 7/13- BP doing better- will con't to monitor with activity  7/17- BP running 120s usually systolic, however was 149/97 this AM- will con't to monitor trend  7/18- looking good- con't regimen  11. Anemia. 14->12->10 hgb, likely post-surgical. Monitor labs. Iron-Vitamin C -B12-FA supplement.       -pt already on iron supp with vitamins above       Latest Ref Rng & Units 12/18/2023    5:48 AM 12/15/2023    4:28 AM 12/13/2023    5:10 AM  CBC  WBC 4.0 - 10.5 K/uL 6.8   8.7   Hemoglobin 13.0 - 17.0 g/dL 89.3  89.6  9.9   Hematocrit 39.0 - 52.0 % 32.9  31.5  30.5   Platelets 150 - 400 K/uL 416   233    Stable 7/19-7/20  7/17- Hb up to 10.3- also hem-occult is (-) so that bodes well  12. Spasticity  7/17- pt's MAS  is 2 in LE's with multiple beats of clonus- pt doesn't want meds scheduled- will add Baclofen  5 mg q6 hours prn for spasticity/tightness  -educated on spasticity- that CAN get worse for 1-2 years after injury- although doesn't always occur-and due to [facility] compression   7/18- we decided to just schedule the Baclofen  5 mg QID ordered since didn't realize to ask for it yesterday  Upon research on Up to date- Saffron CAN interact with Eliquis - so pt needs to stop Tumeric AND Saffron- but not beet root- can also take probiotics at home. Went back to talk to pt about this.     LOS: 9 days A FACE TO FACE EVALUATION WAS PERFORMED  Prentice FORBES Compton 12/19/2023, 11:35 AM

## 2023-12-19 NOTE — Progress Notes (Signed)
 Occupational Therapy Discharge Summary  Patient Details  Name: Corey [name] MRN: [id number] Date of Birth: Aug 21, 1965  Date of Discharge from OT service:December 19, 2023  Today's Date: 12/19/2023 OT Individual Time: 1300-1414 OT Individual Time Calculation (min): 74 min    Patient has met 10 of 10 long term goals due to improved activity tolerance, improved balance, postural control, ability to compensate for deficits, and functional use of  RIGHT lower and LEFT lower extremity. Patient to discharge at overall Modified Independent level. Patient's care partner is independent to provide the necessary physical assistance at discharge.  Family ed completed prior to D/C with good carryover of education provided.   Reasons goals not met: All goals met   Recommendation:  Patient will benefit from ongoing skilled OT services in outpatient setting to continue to advance functional skills in the area of BADL, iADL, Vocation, and Reduce care partner burden.  Equipment: No equipment provided  Reasons for discharge: treatment goals met and discharge from hospital  Patient/family agrees with progress made and goals achieved: Yes  OT Discharge Precautions/Restrictions  Precautions Precautions: Fall;Back Recall of Precautions/Restrictions: Intact Precaution/Restrictions Comments: spinal precautions, no brace needed per orders Restrictions Weight Bearing Restrictions Per Provider Order: No Pain Pain Assessment Pain Scale: 0-10 Pain Score: 4  Pain Location: Back Pain Orientation: Upper Pain Descriptors / Indicators: Aching Pain Intervention(s): Medication (See eMAR);Rest;Food;Ambulation/increased activity;Shower;Repositioned;Distraction;Hot/Cold interventions;Emotional support Multiple Pain Sites: No ADL ADL Equipment Provided: Long-handled sponge Eating: Modified independent Where Assessed-Eating: Wheelchair Grooming: Modified independent Where Assessed-Grooming: Sitting at sink,  Standing at sink Upper Body Bathing: Modified independent Where Assessed-Upper Body Bathing: Sitting at sink Lower Body Bathing: Modified independent Where Assessed-Lower Body Bathing: Shower Upper Body Dressing: Modified independent (Device) Where Assessed-Upper Body Dressing: Edge of bed Lower Body Dressing: Modified independent Where Assessed-Lower Body Dressing: Sitting at sink, Standing at sink Toileting: Modified independent Where Assessed-Toileting: Neurosurgeon Method: Proofreader: Gaffer: Modified independent Web designer Method: Event organiser: Modified independent Film/video editor Method: Designer, industrial/product: Information systems manager with back Vision Baseline Vision/History: 1 Wears glasses Patient Visual Report: No change from baseline Vision Assessment?: No apparent visual deficits Perception  Perception: Within Functional Limits Praxis Praxis: WFL Cognition Cognition Overall Cognitive Status: Within Functional Limits for tasks assessed Arousal/Alertness: Awake/alert Orientation Level: Person;Place;Situation Person: Oriented Place: Oriented Situation: Oriented Memory: Appears intact Awareness: Appears intact Problem Solving: Appears intact Safety/Judgment: Appears intact Brief Interview for Mental Status (BIMS) Repetition of Three Words (First Attempt): 3 Temporal Orientation: Year: Correct Temporal Orientation: Month: Accurate within 5 days Temporal Orientation: Day: Correct Recall: Sock: Yes, no cue required Recall: Blue: Yes, no cue required Recall: Bed: Yes, no cue required BIMS Summary Score: 15 Sensation Sensation Light Touch: Appears Intact Hot/Cold: Appears Intact Proprioception: Appears Intact Stereognosis: Appears Intact Additional Comments: Reports all x5 toes in both feet have been cold and  slightly numb; prior to surgery when symptoms began. Coordination Gross Motor Movements are Fluid and Coordinated: Yes Fine Motor Movements are Fluid and Coordinated: Yes Motor  Motor Motor: Within Functional Limits Mobility  Bed Mobility Bed Mobility: Rolling Right;Rolling Left;Right Sidelying to Sit;Left Sidelying to Sit;Supine to Sit;Sitting - Scoot to Edge of Bed;Sit to Supine;Sit to Sidelying Left Rolling Right: Independent with assistive device Rolling Left: Independent with assistive device Right Sidelying to Sit: Independent with assistive device Left Sidelying to Sit: Independent with assistive device Supine to Sit: Independent with assistive device Sitting -  Scoot to [place] of Bed: Independent with assistive device Sit to Supine: Independent with assistive device Sit to Sidelying Left: Independent with assistive device Transfers Sit to Stand: Independent with assistive device Stand to Sit: Independent with assistive device  Trunk/Postural Assessment  Cervical Assessment Cervical Assessment: Within Functional Limits Thoracic Assessment Thoracic Assessment: Exceptions to [facility] (surgical site) Lumbar Assessment Lumbar Assessment: Within Functional Limits Postural Control Postural Control: Within Functional Limits  Balance Balance Balance Assessed: Yes Standardized Balance Assessment Standardized Balance Assessment: Timed Up and Go Test Timed Up and Go Test TUG: Normal TUG Normal TUG (seconds): 22.5 (3 trials 27.74, 20.85, 19.08) Static Sitting Balance Static Sitting - Balance Support: No upper extremity supported;Feet supported Static Sitting - Level of Assistance: 7: Independent Dynamic Sitting Balance Dynamic Sitting - Balance Support: No upper extremity supported Dynamic Sitting - Level of Assistance: 7: Independent Dynamic Sitting - Balance Activities: Forward lean/weight shifting;Lateral lean/weight shifting;Reaching for Higher education careers adviser  Standing - Balance Support: Bilateral upper extremity supported Static Standing - Level of Assistance: 6: Modified independent (Device/Increase time) Dynamic Standing Balance Dynamic Standing - Balance Support: During functional activity;Bilateral upper extremity supported Dynamic Standing - Level of Assistance: 6: Modified independent (Device/Increase time) Dynamic Standing - Balance Activities: Reaching for objects;Forward lean/weight shifting Extremity/Trunk Assessment RUE Assessment RUE Assessment: Within Functional Limits Active Range of Motion (AROM) Comments: WFL General Strength Comments: 5/5 LUE Assessment LUE Assessment: Within Functional Limits Active Range of Motion (AROM) Comments: WFL General Strength Comments: 5/5  Tx session General: Pt seated in W/C upon OT arrival, agreeable to OT.  Pain: see above  ADL: OT providing skilled intervention with functional mobility. Pt able to ambulate with RW from room><ortho gym with mod I no LOB/SOB or rest breaks  Balance: Pt completed a variety of standing activities on BITS balance system in order to promote increased balance strategies with ADL participation. Pt completed all activities at standing on balance board level with intermittent supported/unsupported standing at RW while completing activities. Pt completed exercises listed below: -lateral Lt and Rt leaning on balance board with RW support, 1:00 min, 47 sec time on target, 12 sec off target, 78% accuracy -anterior/posterior figure 8, 1:00, 23 sec on target, 36 sec off target, 39% accuracy, this trial pt attempting unsupported standing with increased difficulty -target array, py completed for 3:50 min, 10 hits, 4 misses, 71% accuracy    Pt seated in W/C at end of session, call light within reach and 4Ps assessed.    Camie Hoe, OTD, OTR/L 12/19/2023, 2:19 PM

## 2023-12-19 NOTE — Progress Notes (Signed)
 Physical Therapy Session Note  Patient Details  Name: Corey [name] MRN: [id number] Date of Birth: 11/14/1965  Today's Date: 12/19/2023 PT Individual Time: 1020-1120 PT Individual Time Calculation (min): 60 min   Short Term Goals: Week 1:  PT Short Term Goal 1 (Week 1): STG = LTG due to ELOS  Skilled Therapeutic Interventions/Progress Updates: Pt presented in w/c agreeable to therapy. Pt c/o mild pain 1/10, premedicated with rest breaks provided as needed. Pt transported to ortho gym for time management and participated in funcitonal activities including car transfer, up/down ramp, and on mulch with RW and mod I to supervision as noted in chart. Pt then ambulated to main gym and completed ascending/descending x 12 step B rails with supervision and step through pattern. Completed TUG with improved score of 22.5 sec and participated in rebounder for dynamic balance on level tile as well as on Airex for increased ankle strategy recruitment. Pt initially requiring CGA while on Airex due to mild retropulsion however with time was able to maintain balance with supervision. Discussed practicing bed mobility on standard bed which pt stated had not practiced. Pt ambulated to ADL apt and completed furniture transfers (couch) mod I with increased effort noted. Pt was able to complete bed mobility on standard bed mod I (increased time/effort) and maintained spinal precautions. Pt ambulated back to room mod I at end of session and requested to rest in bed until next session. Pt completed transfers and bed mobility mod I. Pt left in bed at end of session with call bell within reach and needs met.      Therapy Documentation Precautions:  Precautions Precautions: Fall, Back Precaution Booklet Issued: Yes (comment) Recall of Precautions/Restrictions: Intact Precaution/Restrictions Comments: spinal precautions, no brace needed per orders Restrictions Weight Bearing Restrictions Per Provider Order:  No General:   Vital Signs:   Pain: Pain Assessment Pain Scale: 0-10 Pain Score: 1  Pain Location: Incision Mobility: Bed Mobility Bed Mobility: (P) Rolling Right;Rolling Left;Right Sidelying to Sit;Left Sidelying to Sit;Supine to Sit;Sitting - Scoot to Edge of Bed;Sit to Supine;Sit to Sidelying Left Rolling Right: (P) Independent Locomotion : Gait Ambulation: Yes Gait Assistance: Independent with assistive device Gait Distance (Feet): 200 Feet Assistive device: Rolling walker Gait Gait: Yes Gait Pattern: Impaired Gait Pattern: Step-through pattern;Decreased stride length;Decreased weight shift to right;Decreased weight shift to left Stairs / Additional Locomotion Stairs: Yes Stairs Assistance: Supervision/Verbal cueing Stair Management Technique: Two rails;Alternating pattern Number of Stairs: 12 Height of Stairs: 6 Wheelchair Mobility Wheelchair Mobility: Yes Wheelchair Assistance: Independent with assistive device Wheelchair Parts Management: Supervision/cueing  Trunk/Postural Assessment : Cervical Assessment Cervical Assessment: Within Functional Limits Thoracic Assessment Thoracic Assessment: Exceptions to [facility] Lumbar Assessment Lumbar Assessment: Within Functional Limits Postural Control Postural Control: Within Functional Limits  Balance: Balance Balance Assessed: Yes Standardized Balance Assessment Standardized Balance Assessment: Timed Up and Go Test Timed Up and Go Test TUG: Normal TUG Normal TUG (seconds): 22.5 (3 trials 27.74, 20.85, 19.08) Dynamic Sitting Balance Dynamic Sitting - Balance Support: No upper extremity supported Dynamic Sitting - Level of Assistance: 7: Independent Static Standing Balance Static Standing - Balance Support: Bilateral upper extremity supported Static Standing - Level of Assistance: 6: Modified independent (Device/Increase time) Dynamic Standing Balance Dynamic Standing - Balance Support: During functional  activity;Bilateral upper extremity supported Dynamic Standing - Level of Assistance: 6: Modified independent (Device/Increase time) Exercises:   Other Treatments:      Therapy/Group: Individual Therapy  Erek Kowal 12/19/2023, 1:01 PM

## 2023-12-19 NOTE — Plan of Care (Signed)
  Problem: RH Balance Goal: LTG: Patient will maintain dynamic sitting balance (OT) Description: LTG:  Patient will maintain dynamic sitting balance with assistance during activities of daily living (OT) Outcome: Completed/Met Flowsheets (Taken 12/11/2023 1006 by Green, D'Mariea L, OT) LTG: Pt will maintain dynamic sitting balance during ADLs with: Independent with assistive device Goal: LTG Patient will maintain dynamic standing with ADLs (OT) Description: LTG:  Patient will maintain dynamic standing balance with assist during activities of daily living (OT)  Outcome: Completed/Met Flowsheets (Taken 12/11/2023 1006 by Green, D'Mariea L, OT) LTG: Pt will maintain dynamic standing balance during ADLs with: Independent with assistive device   Problem: Sit to Stand Goal: LTG:  Patient will perform sit to stand in prep for activites of daily living with assistance level (OT) Description: LTG:  Patient will perform sit to stand in prep for activites of daily living with assistance level (OT) Outcome: Completed/Met Flowsheets (Taken 12/11/2023 1006 by Landy, D'Mariea L, OT) LTG: PT will perform sit to stand in prep for activites of daily living with assistance level: Independent with assistive device   Problem: RH Eating Goal: LTG Patient will perform eating w/assist, cues/equip (OT) Description: LTG: Patient will perform eating with assist, with/without cues using equipment (OT) Outcome: Completed/Met Flowsheets (Taken 12/11/2023 1006 by Landy, D'Mariea L, OT) LTG: Pt will perform eating with assistance level of: Independent with assistive device    Problem: RH Grooming Goal: LTG Patient will perform grooming w/assist,cues/equip (OT) Description: LTG: Patient will perform grooming with assist, with/without cues using equipment (OT) Outcome: Completed/Met Flowsheets (Taken 12/11/2023 1006 by Green, D'Mariea L, OT) LTG: Pt will perform grooming with assistance level of: Independent with assistive  device    Problem: RH Bathing Goal: LTG Patient will bathe all body parts with assist levels (OT) Description: LTG: Patient will bathe all body parts with assist levels (OT) Outcome: Completed/Met Flowsheets (Taken 12/11/2023 1006 by Landy, D'Mariea L, OT) LTG: Pt will perform bathing with assistance level/cueing: Independent with assistive device    Problem: RH Dressing Goal: LTG Patient will perform upper body dressing (OT) Description: LTG Patient will perform upper body dressing with assist, with/without cues (OT). Outcome: Completed/Met Flowsheets (Taken 12/11/2023 1006 by Landy, D'Mariea L, OT) LTG: Pt will perform upper body dressing with assistance level of: Independent with assistive device Goal: LTG Patient will perform lower body dressing w/assist (OT) Description: LTG: Patient will perform lower body dressing with assist, with/without cues in positioning using equipment (OT) Outcome: Completed/Met Flowsheets (Taken 12/11/2023 1006 by Landy, D'Mariea L, OT) LTG: Pt will perform lower body dressing with assistance level of: Independent with assistive device   Problem: RH Toileting Goal: LTG Patient will perform toileting task (3/3 steps) with assistance level (OT) Description: LTG: Patient will perform toileting task (3/3 steps) with assistance level (OT)  Outcome: Completed/Met Flowsheets (Taken 12/11/2023 1006 by Landy, D'Mariea L, OT) LTG: Pt will perform toileting task (3/3 steps) with assistance level: Independent with assistive device   Problem: RH Toilet Transfers Goal: LTG Patient will perform toilet transfers w/assist (OT) Description: LTG: Patient will perform toilet transfers with assist, with/without cues using equipment (OT) Outcome: Completed/Met Flowsheets (Taken 12/11/2023 1006 by Landy, D'Mariea L, OT) LTG: Pt will perform toilet transfers with assistance level of: Independent with assistive device

## 2023-12-20 ENCOUNTER — Other Ambulatory Visit (HOSPITAL_COMMUNITY): Payer: Self-pay

## 2023-12-20 DIAGNOSIS — M4714 Other spondylosis with myelopathy, thoracic region: Secondary | ICD-10-CM | POA: Diagnosis not present

## 2023-12-20 MED ORDER — PAROXETINE HCL 20 MG PO TABS
20.0000 mg | ORAL_TABLET | Freq: Every day | ORAL | 0 refills | Status: AC
  Filled 2023-12-20: qty 30, 30d supply, fill #0

## 2023-12-20 MED ORDER — APIXABAN (ELIQUIS) EDUCATION KIT FOR DVT/PE PATIENTS
PACK | Freq: Once | Status: AC
  Filled 2023-12-20: qty 1

## 2023-12-20 MED ORDER — BACLOFEN 5 MG PO TABS
5.0000 mg | ORAL_TABLET | Freq: Four times a day (QID) | ORAL | 0 refills | Status: DC
  Filled 2023-12-20: qty 120, 30d supply, fill #0

## 2023-12-20 MED ORDER — METHOCARBAMOL 500 MG PO TABS
500.0000 mg | ORAL_TABLET | Freq: Four times a day (QID) | ORAL | 0 refills | Status: DC | PRN
  Filled 2023-12-20: qty 30, 8d supply, fill #0

## 2023-12-20 MED ORDER — FE FUM-VIT C-VIT B12-FA 460-60-0.01-1 MG PO CAPS
1.0000 | ORAL_CAPSULE | Freq: Two times a day (BID) | ORAL | 0 refills | Status: DC
  Filled 2023-12-20: qty 60, 30d supply, fill #0

## 2023-12-20 MED ORDER — APIXABAN 5 MG PO TABS
5.0000 mg | ORAL_TABLET | Freq: Two times a day (BID) | ORAL | 0 refills | Status: AC
  Filled 2023-12-20: qty 60, 30d supply, fill #0

## 2023-12-20 MED ORDER — OXYCODONE HCL 5 MG PO TABS
5.0000 mg | ORAL_TABLET | ORAL | 0 refills | Status: DC | PRN
  Filled 2023-12-20: qty 20, 5d supply, fill #0

## 2023-12-20 NOTE — Progress Notes (Signed)
 Inpatient Rehabilitation Discharge Medication Review by a Pharmacist  A complete drug regimen review was completed for this patient to identify any potential clinically significant medication issues.  High Risk Drug Classes Is patient taking? Indication by Medication  Antipsychotic No   Anticoagulant Yes Apixaban  - DVT  Antibiotic No    Opioid Yes PRN Oxycodone  - moderate or severe pain  Antiplatelet No   Hypoglycemics/insulin No   Vasoactive Medication No   Chemotherapy No   Other Yes Baclofen  - spasticity Paroxetine  - depression Trigels Forte, MVI - supplements Docusate - laxative  PRNs: Acetaminophen  - mild pain Methocarbamol  - muscle spasms Miralax  - constipation     Type of Medication Issue Identified Description of Issue Recommendation(s)  Drug Interaction(s) (clinically significant)     Duplicate Therapy     Allergy      No Medication Administration End Date     Incorrect Dose     Additional Drug Therapy Needed     Significant med changes from prior encounter (inform family/care partners about these prior to discharge). Only taking Paroxetine  and multivitamins PTA; all other meds are new.   Vitamin B-12 1000 mcg SQ given x 3 days.     Communicate changes with patient/family prior to discharge.  Other       Clinically significant medication issues were identified that warrant physician communication and completion of prescribed/recommended actions by midnight of the next day:  No  Pharmacist comments:   Time spent performing this drug regimen review (minutes):  20  Genaro Zebedee Calin, [place] 12/20/2023 8:26 AM

## 2023-12-20 NOTE — Progress Notes (Signed)
 Inpatient Rehabilitation Care Coordinator Discharge Note   Patient Details  Name: Corey [name] MRN: [id number] Date of Birth: July 03, 1965   Discharge location: D/c to home with his wife  Length of Stay: 9 days  Discharge activity level: Mod I  Home/community participation: Limited  Patient response un:Yzjouy Literacy - How often do you need to have someone help you when you read instructions, pamphlets, or other written material from your doctor or pharmacy?: Never  Patient response un:Dnrpjo Isolation - How often do you feel lonely or isolated from those around you?: Never  Services provided included: MD, RD, PT, OT, RN, CM, Pharmacy, Neuropsych, SW, TR  Financial Services:  Field seismologist Utilized: [employer]  Choices offered to/list presented to: Patient and pt wife  Follow-up services arranged:  Outpatient    Outpatient Servicies: [facility] for PT/OT/SLP      Patient response to transportation need: Is the patient able to respond to transportation needs?: Yes In the past 12 months, has lack of transportation kept you from medical appointments or from getting medications?: No In the past 12 months, has lack of transportation kept you from meetings, work, or from getting things needed for daily living?: No   Patient/Family verbalized understanding of follow-up arrangements:  Yes  Individual responsible for coordination of the follow-up plan: contact pt or pt wife Corey [name]  Confirmed correct DME delivered: Corey [name] 12/20/2023    Comments (or additional information):fam edu completed  Summary of Stay    Date/Time Discharge Planning CSW  12/14/23 1109 D/C to home with his wife who will take FMLA. Fam edu on Friday 10-12pm. SW will confirm there are no barriers to discharge. AAC       Corey [name] A [name]

## 2023-12-20 NOTE — Progress Notes (Signed)
 Dressing supplies given to patient. Nurse made aware of supplies.

## 2023-12-20 NOTE — Progress Notes (Signed)
 INPATIENT REHABILITATION DISCHARGE NOTE   Discharge instructions by: Rolan, PA  Verbalized understanding: yes  Skin care/Wound care healing? Yes, staples intact to back incision.   Pain: medicated  IV's: none  Tubes/Drains: none  O2: none  Safety instructions: reviewed with pt and family  Patient belongings: sent with pt  Discharged to: home  Discharged via: family transport  Notes: done  Geralynn LELON Earl, Charity fundraiser

## 2023-12-20 NOTE — Progress Notes (Signed)
   12/20/23 0034  BiPAP/CPAP/SIPAP  $ Non-Invasive Home Ventilator  Subsequent  BiPAP/CPAP/SIPAP Pt Type Adult  BiPAP/CPAP/SIPAP DREAMSTATIOND  Mask Type Nasal mask  Dentures removed? Not applicable  Respiratory Rate 18 breaths/min  Patient Home Machine Yes  Safety Check Completed by RT for Home Unit Yes, no issues noted  Patient Home Mask Yes  Patient Home Tubing Yes  Auto Titrate No  Device Plugged into RED Power Outlet Yes  BiPAP/CPAP /SiPAP Vitals  Bilateral Breath Sounds Clear

## 2023-12-20 NOTE — Progress Notes (Signed)
 PROGRESS NOTE   Subjective/Complaints:  Pt reports excited to go home.  Baclofen  has helped.  Educated that can get 7 days of pain meds when goes home, then call for refills.  Also, has appt Wednesday to get staples out.    ROS:   Per HPI   Pt denies SOB, abd pain, CP, N/V/C/D, and vision changes   Objective:   No results found.  Recent Labs    12/18/23 0548  WBC 6.8  HGB 10.6*  HCT 32.9*  PLT 416*   No results for input(s): NA, K, CL, CO2, GLUCOSE, BUN, CREATININE, CALCIUM  in the last 72 hours.   Intake/Output Summary (Last 24 hours) at 12/20/2023 0826 Last data filed at 12/20/2023 0754 Gross per 24 hour  Intake 120 ml  Output 750 ml  Net -630 ml        Physical Exam: Vital Signs Blood pressure 117/74, pulse 87, temperature 98.2 F (36.8 C), resp. rate 19, height 5' 9 (1.753 m), weight 107.5 kg, SpO2 100%.    General: awake, alert, appropriate, sitting up in w/c finished eating;  NAD HENT: conjugate gaze; oropharynx moist CV: regular rate and rhythm; no JVD Pulmonary: CTA B/L; no W/R/R- good air movement GI: soft, NT, ND, (+)BS Psychiatric: appropriate Neurological: Ox3  MAS of 2 in hips, knees and ankles ROM; 5-6 beats of clonus B/L LE's Ext: no clubbing, cyanosis, or edema Psych: pleasant and cooperative  Skin: C/D/I. Back incision cdi with staples. Drain site with scarce drainage MSK:      No apparent deformity. + L knee TKR scar. R knee extension slightly limited 0-5 degrees--still an issue. Therapy massaging Neurologic exam:  Alert and oriented x 3. Normal insight and awareness. Intact Memory. Normal language and speech. Cranial nerve exam unremarkable.    Sensation: To light touch intact in BL UEs and Les; notes sensory decrease in all bilateral toes Reflexes: still 2+ in BL UE and LEs. Negative Hoffman's and babinski signs bilaterally.  CN: 2-12 grossly intact.   Coordination: No apparent tremors. + LLE ataxia with HTS; otherwise intact Spasticity: MAS 0 in all extremities.       Strength:                RUE: 5/5 SA, 5/5 EF, 5/5 EE, 5/5 WE, 5/5 FF, 5/5 FA                LUE:  5/5 SA, 5/5 EF, 5/5 EE, 5/5 WE, 5/5 FF, 5/5 FA                RLE: 4/5 HF, 5/5 KE, 5/5  DF, 5/5  EHL, 5/5  PF                 LLE:  4/5 HF, 4- to 4/5 KE, 5/5  DF, 5/5  EHL, 5/5  PF     Assessment/Plan: 1. Functional deficits which require 3+ hours per day of interdisciplinary therapy in a comprehensive inpatient rehab setting. Physiatrist is providing close team supervision and 24 hour management of active medical problems listed below. Physiatrist and rehab team continue to assess barriers to discharge/monitor patient progress toward functional and medical goals  Care Tool:  Bathing    Body parts bathed by patient: Right arm, Left arm, Chest, Abdomen, Front perineal area, Buttocks, Right upper leg, Left upper leg, Face, Left lower leg, Right lower leg   Body parts bathed by helper: Right lower leg, Left lower leg     Bathing assist Assist Level: Supervision/Verbal cueing     Upper Body Dressing/Undressing Upper body dressing   What is the patient wearing?: Pull over shirt    Upper body assist Assist Level: Independent    Lower Body Dressing/Undressing Lower body dressing      What is the patient wearing?: Pants     Lower body assist Assist for lower body dressing: Supervision/Verbal cueing     Toileting Toileting    Toileting assist Assist for toileting: Minimal Assistance - Patient > 75%     Transfers Chair/bed transfer  Transfers assist     Chair/bed transfer assist level: Independent with assistive device     Locomotion Ambulation   Ambulation assist      Assist level: Independent with assistive device Assistive device: Walker-rolling Max distance: 229ft   Walk 10 feet activity   Assist     Assist level: Independent with  assistive device Assistive device: Walker-rolling   Walk 50 feet activity   Assist    Assist level: Independent with assistive device Assistive device: Walker-rolling    Walk 150 feet activity   Assist    Assist level: Independent with assistive device Assistive device: Walker-rolling    Walk 10 feet on uneven surface  activity   Assist Walk 10 feet on uneven surfaces activity did not occur: Safety/medical concerns   Assist level: Supervision/Verbal cueing Assistive device: Walker-rolling   Wheelchair     Assist Is the patient using a wheelchair?: Yes Type of Wheelchair: Manual    Wheelchair assist level: Independent Max wheelchair distance: 161ft    Wheelchair 50 feet with 2 turns activity    Assist        Assist Level: Independent   Wheelchair 150 feet activity     Assist      Assist Level: Independent   Blood pressure 117/74, pulse 87, temperature 98.2 F (36.8 C), resp. rate 19, height 5' 9 (1.753 m), weight 107.5 kg, SpO2 100%.   Medical Problem List and Plan: 1. Functional deficits secondary to thoracic myelopathy.  Status post T3 transpedicular decompression, T1-T4 PSF 12/07/2023 per Dr. Penne Sharps neurosurgery.NO BRACE REQUIRED.  As per neurosurgery postoperative drain to be removed 12/11/2022.  Wound VAC can be removed when loses suction or when the battery runs down.             -patient may shower now that [facility] was removed             -ELOS/Goals: 10-12 days, Mod I PT/OT            D/c 7/21  D/C today -spasticity is limiting pt's ability to participate- L knee will buckle intermittently 2.  Antithrombotics: -DVT/anticoagulation:  Pharmaceutical: Lovenox .  Check vascular study             -antiplatelet therapy: N/A              - 7/11: Admission duplex + DVT R proximal posterior tibial veins, R mid-peroneal veins, L posterior tibial veins, L peroneal veins, and intramuscular thrombosis on R gastrocnemius veins. Rolan Haggard d/w  Neurosurgery Dr. Theressa office, unable to start full dose AC until 7d post-op. Recommend continuation of PPX dose lovenox   40 mg daily with repeat Duplex Monday to ensure no extension.              -- Later, messaged by Dr. Claudene requesting additional consult for IVC filter; order placed, d/w [facility] PA for follow-up in AM with IR for eval   7/12- spoke with IR_ will do IVC filter tomorrow- and will make him NPO- went back and spoke to pt about plan  7/14 IVCF placed yesterday without issues--start full a/c tomorrow??  7/15- will reach out to Dr Claudene to see when can start therapeutic [facility], considering has IVC filter. Has been 7 days today, but will see what he says  7/16- starting Eliquis  5 mg BID today- don't feel comfortable starting 10 mg BID  due to being 9 days out from surgery on spine, and risk of epidural hematoma- so since he has IVC filter, I think the benefits of 5 mg BID outweigh risks, but not the 10 mg BID- d/w pt and pharmacy. Still has R gastroc DVT, but LLE DVTs have resolved, of note.   7/18- will recheck labs Saturday before d/c to make sure no drop in Hb  7/21- Hb has stayed stable 3. Pain Management: Robaxin  and oxycodone  as needed  7/16- pain is manageable-  4. Mood/Behavior/Sleep: Paxil  20 mg daily             -antipsychotic agents:  5. Neuropsych/cognition: This patient is capable of making decisions on his own behalf. 6. Skin/Wound Care: Routine skin checks              - vac battery died. Removed. Dry dressing in place  -almost no drainage from former drain site---will change to foam dressings with perhaps a piece of gauze on drain site too  7/15- No significant drainage- 7/21- looks amazing- will leave staples in and have NSU remove at appt Wednesday  7. Fluids/Electrolytes/Nutrition: Routine in and outs with follow-up chemistries 8.  Constipation.  Colace 100 mg twice daily, MiraLAX  daily as needed             - LBM 7/12  7/15- LBM 7/12 after sorbitol - feels  like could go today- will make Miralax  daily since still taking pain meds 2x/day and normally goes 1x/day at home.   7/16- no BM with Miralax - will order Sorbitol  after therapy at 4pm and Soap suds enema if doesn't have BM by 8pm.   7/17- had BM yesterday without Sorbitol  or enema. I'm glad he had BM without more extreme measures.  9.  OSA.  CPAP 10.  Hypertension.  Currently on no antihypertensive medications or prior to admission.  Monitor with increased mobility  7/12- 7/13- BP doing better- will con't to monitor with activity  7/17- BP running 120s usually systolic, however was 149/97 this AM- will con't to monitor trend  7/18- looking good- con't regimen  11. Anemia. 14->12->10 hgb, likely post-surgical. Monitor labs. Iron-Vitamin C -B12-FA supplement.       -pt already on iron supp with vitamins above       Latest Ref Rng & Units 12/18/2023    5:48 AM 12/15/2023    4:28 AM 12/13/2023    5:10 AM  CBC  WBC 4.0 - 10.5 K/uL 6.8   8.7   Hemoglobin 13.0 - 17.0 g/dL 89.3  89.6  9.9   Hematocrit 39.0 - 52.0 % 32.9  31.5  30.5   Platelets 150 - 400 K/uL 416   233    Stable 7/19-7/20  7/17-  Hb up to 10.3- also hem-occult is (-) so that bodes well  12. Spasticity  7/17- pt's MAS is 2 in LE's with multiple beats of clonus- pt doesn't want meds scheduled- will add Baclofen  5 mg q6 hours prn for spasticity/tightness  -educated on spasticity- that CAN get worse for 1-2 years after injury- although doesn't always occur-and due to [facility] compression   7/18- we decided to just schedule the Baclofen  5 mg QID ordered since didn't realize to ask for it yesterday  7/21- working better- con't regimen  Upon research on Up to date- Saffron CAN interact with Eliquis - so pt needs to stop Tumeric AND Saffron- but not beet root- can also take probiotics at home. Went back to talk to pt about this.    The patient is medically ready for discharge to home and will need follow-up with [facility] PM&R. In addition, they will  need to follow up with their PCP, Neurosurgery. Has appt with NSU- asked pt to get appt with Fidela and me at the same time, so can see me earlier. Call me if spaticity gets worse.   I spent a total of 32   minutes on total care today- >50% coordination of care- due to  D/w pt about plans for d/c- PA going over meds; as well d/w pt about pain meds- and plan after d/c.   LOS: 10 days A FACE TO FACE EVALUATION WAS PERFORMED  Concettina Leth 12/20/2023, 8:26 AM

## 2023-12-21 ENCOUNTER — Telehealth: Payer: Self-pay

## 2023-12-21 NOTE — Telephone Encounter (Signed)
 Transitional Care Call--who you spoke with    Are you/is patient experiencing any problems since coming home?  No Patient reports having a warm sensation on the back of his calves when something touches him in that area. Are there any questions regarding any aspect of care? No Are there any questions regarding medications administration/dosing? No Are meds being taken as prescribed?  Yes Patient should review meds with caller to confirm. Reviewed Have there been any falls?  No falls  Has Home Health been to the house and/or have they contacted you? No Home Health at this time. If not, have you tried to contact them? No Can we help you contact them?  No home health.  Patient reports following the exercises given at discharge. Are bowels and bladder emptying properly?  Yes, no issues with voiding or bowel movements Are there any unexpected incontinence issues?  No If applicable, is patient following bowel/bladder programs? N/A Any fevers, problems with breathing, unexpected pain? No fevers, problems with breathing or unexpected pain.  Are there any skin problems or new areas of breakdown? No areas of concern with breakdown or bruising Has the patient/family member arranged specialty MD follow up (ie cardiology/neurology/renal/surgical/etc)?   Patient is    scheduled to see Neurology on 12/22/23  Does the patient need any other services or support that we can help arrange?  Patient states No  Are caregivers following through as expected in assisting the patient? N/A       19. Has the patient quit smoking, drinking alcohol, or using drugs as recommended? Yes  Patient advised paperwork has been mailed and to complete and bring to appointment.  Patient verbalized understanding.  Appointment :  01/07/24 at 9:40 am with Fidela Ned, NP please arrive at 9:20.   [address] [phone]

## 2023-12-22 ENCOUNTER — Ambulatory Visit (INDEPENDENT_AMBULATORY_CARE_PROVIDER_SITE_OTHER): Admitting: Physician Assistant

## 2023-12-22 ENCOUNTER — Encounter: Payer: Self-pay | Admitting: Physician Assistant

## 2023-12-22 VITALS — BP 128/82 | Temp 99.2°F | Ht 69.0 in | Wt 239.0 lb

## 2023-12-22 DIAGNOSIS — M4804 Spinal stenosis, thoracic region: Secondary | ICD-10-CM

## 2023-12-22 DIAGNOSIS — Z981 Arthrodesis status: Secondary | ICD-10-CM

## 2023-12-22 DIAGNOSIS — G992 Myelopathy in diseases classified elsewhere: Secondary | ICD-10-CM

## 2023-12-22 NOTE — Progress Notes (Signed)
   REFERRING PHYSICIAN:  Joyce Norleen BROCKS, Md [address] [place],  [place] [address]  DOS: T1-T4 Posterior Spinal instrumentation and Fusion, T3 Transpedicular decompression, Additonal Laminectomies, 12/07/23  HISTORY OF PRESENT ILLNESS: Corey [name] is approximately 2 weeks status post T1-4 posterior spinal instrumentation and fusion, T3 transpedicular decompression due to severe thoracic myelopathy with progressive ambulatory functional loss. he is doing well.  Continues to have some pain in his mid back up to his neck.  He does feel as though the weakness in his legs is improving however he continues to use a rollator.  No new weakness, numbness or tingling.  Of note his operative course was complicated after shown to have a DVT postoperatively on day 3.  He continues to take Eliquis  for this.  He denies any shortness of breath, trouble breathing, pain or swelling in his contralateral leg.  PHYSICAL EXAMINATION:  General: Patient is well developed, well nourished, calm, collected, and in no apparent distress.   NEUROLOGICAL:  General: In no acute distress.   Awake, alert, oriented to person, place, and time.  Pupils equal round and reactive to light.  Facial tone is symmetric.     Strength:            Side Iliopsoas Quads Hamstring PF DF EHL  R 4+ 5 5 5 5 5   L 4+ 5 5 5 5 5    Incision c/d/i   ROS (Neurologic):  Negative except as noted above  IMAGING: No new imaging prior to this appointment.  ASSESSMENT/PLAN:  Corey [name] is approximately 2 weeks status post T1-4 posterior spinal instrumentation and fusion, T3 transpedicular decompression due to severe thoracic myelopathy with progressive ambulatory functional loss. he is doing well.  Continues to have some pain in his mid back up to his neck.  He does feel as though the weakness in his legs is improving however he continues to use a rollator.  No new weakness, numbness or tingling.Of note his operative course was  complicated after shown to have a DVT postoperatively on day 3.  He continues to take Eliquis  for this.  He denies any shortness of breath, trouble breathing, pain or swelling in his contralateral leg.  Examination is improved compared to prior to surgery.  Incision is well-appearing.  Staples removed today without issue.I have advised the patient to lift up to 10 pounds until 6 weeks after surgery, then increase up to 25 pounds until 12 weeks after surgery.  After 12 weeks post-op, the patient advised to increase activity as tolerated.  Advised to contact the office if any questions or concerns arise.  Lyle Decamp PA-C Department of neurosurgery

## 2023-12-28 ENCOUNTER — Telehealth: Payer: Self-pay

## 2023-12-28 MED ORDER — OXYCODONE HCL 5 MG PO TABS: 5.0000 mg | ORAL_TABLET | Freq: Four times a day (QID) | ORAL | 0 refills | Status: DC | PRN

## 2023-12-30 NOTE — Telephone Encounter (Signed)
 Notified of refill status.

## 2024-01-06 NOTE — Therapy (Signed)
 OUTPATIENT PHYSICAL THERAPY NEURO EVALUATION   Patient Name: Corey [name] MRN: [id number] DOB:[birth year], 58 y.o., male Today's Date: 01/10/2024   PCP: Joyce Norleen BROCKS, MD   REFERRING PROVIDER: Pegge Toribio PARAS, PA-C  END OF SESSION:  PT End of Session - 01/10/24 0847     Visit Number 1    Number of Visits 13    Date for PT Re-Evaluation 03/10/24    Authorization Type BLUE [employer]    PT Start Time [phone]    PT Stop Time 0927    PT Time Calculation (min) 41 min    Equipment Utilized During Treatment Gait belt    Activity Tolerance Patient tolerated treatment well    Behavior During Therapy WFL for tasks assessed/performed          Past Medical History:  Diagnosis Date   Arthritis    knee, left   ED (erectile dysfunction)    Hyperlipidemia    Hypertension    Sleep apnea    CPAP - not using lately due to recall    Past Surgical History:  Procedure Laterality Date   ANKLE BONE SURGERY     APPLICATION OF INTRAOPERATIVE CT SCAN  12/07/2023   Procedure: APPLICATION OF INTRAOPERATIVE CT SCAN;  Surgeon: Claudene Penne ORN, MD;  Location: [facility] ORS;  Service: Neurosurgery;;   BACK SURGERY     COLLAR BONE SURGEY     COLONOSCOPY     FRACTURE SURGERY     HERNIA REPAIR     IR IVC FILTER PLMT / S&I /IMG GUID/MOD SED  12/12/2023   KNEE ARTHROSCOPY Left 2006   LAPAROSCOPIC APPENDECTOMY N/A 01/13/2021   Procedure: APPENDECTOMY LAPAROSCOPIC WITH LYSIS OF ADHESIONS;  Surgeon: Sheldon Standing, MD;  Location: [facility];  Service: General;  Laterality: N/A;   LUMBAR MICRODISCECTOMY     POLYPECTOMY     SKIN BIOPSY Left 08/11/2021   nodulocystic fat nercrosis   TOTAL KNEE ARTHROPLASTY Left 11/11/2020   Procedure: LEFT TOTAL KNEE ARTHROPLASTY;  Surgeon: Barbarann Oneil BROCKS, MD;  Location: [facility] OR;  Service: Orthopedics;  Laterality: Left;   UMBILICAL HERNIA REPAIR     Patient Active Problem List   Diagnosis Date Noted   Coping style affecting medical condition 12/15/2023    Myelopathy concurrent with and due to spinal stenosis of thoracic region [facility]) 12/06/2023   Spinal stenosis 12/06/2023   Thoracic myelopathy 12/06/2023   Morbid obesity (HCC) 12/06/2023   Depression 12/06/2023   Radiculopathy, lumbar region 11/10/2023   Flexion contracture of knee, left 03/01/2023   Prediabetes 11/19/2022   Status post laparoscopic appendectomy 01/14/2021   H/O total knee replacement, left 11/11/2020   Leiomyoma 11/08/2020   Arthritis 09/26/2018   Onychomycosis 09/26/2018   Hyperlipidemia LDL goal <100 08/31/2016   OSA on CPAP 08/31/2016   Premature ejaculation 08/19/2011    ONSET DATE: 12/16/2023  REFERRING DIAG: M47.14 (ICD-10-CM) - Thoracic myelopathy  THERAPY DIAG:  Unsteadiness on feet  Pain in thoracic spine  Other abnormalities of gait and mobility  Muscle weakness (generalized)  Rationale for Evaluation and Treatment: Rehabilitation  SUBJECTIVE:  SUBJECTIVE STATEMENT: Feels like he is getting better and stronger. Sitting for a long time will make it hurt more, so has to lay down in between. Has not really been motivated to do much. Brought in his RW today, but has started using a SPC at home. Uses his RW when he goes out, and also has a rollator. Haven't used it in a while. Prior to back surgery, used RW 2 weeks before he went to the hospital, otherwise was not using any AD. No falls since surgery, but had some before surgery. Following up with the neurosurgeon on Wednesday   Pt accompanied by: self  PERTINENT HISTORY: Status post T3 transpedicular decompression, T1-T4 PSF 12/07/2023 due to severe thoracic myelopathy with progressive ambulatory functional loss per Dr. Penne Sharps neurosurgery.NO BRACE REQUIRED. Continues to have some pain in his mid back up to his neck.  He does feel as though the weakness in his legs is improving however he continues to use a rollator. No new weakness, numbness or tingling.Of note his operative course was complicated after shown to have a DVT postoperatively on day 3. He continues to take Eliquis  for this   Got inpatient rehab and was discharged home 12/20/23  PMH: L knee arthritis, HLD, HTN, L knee replacement 2022  PAIN:  Are you having pain? Yes: NPRS scale: 4/10 Pain location: Mid back Pain description: Sharp/stabbing  Aggravating factors: Sitting for long hours, trying to do exercises or walking  Relieving factors: Medication, laying down reclined    Vitals:   01/10/24 0859  BP: 120/84  Pulse: 78     PRECAUTIONS: Back  Precaution/Restrictions Comments: spinal precautions, no brace needed per orders   FALLS: Has patient fallen in last 6 months? Yes. Number of falls 20, did not injure himself, legs were weak and didn't have strength in his legs  LIVING ENVIRONMENT: Lives with: lives with their spouse Lives in: House/apartment Stairs: Yes: External: 2 steps; none Has following equipment at home: Single point cane, Walker - 2 wheeled, Environmental consultant - 4 wheeled, shower chair, and raised grab bars  PLOF: Independent, Vocation/Vocational requirements: Estate agent , and Leisure: Working out, taking walks   PATIENT GOALS: Wants to get back to being independent, getting his strength back and walking on his own   OBJECTIVE:  Note: Objective measures were completed at Evaluation unless otherwise noted.  COGNITION: Overall cognitive status: Within functional limits for tasks assessed   SENSATION: Light touch: WFL Proprioception: WFL and with ankle DF/PF, noted some bouts of clonus bilat when testing into ankle DF   Pt reporting numbness/tingling in L knee and both feet have numbness/tingling and coldness  Sometimes if he rubs up against something will feel warmth   COORDINATION: Heel to shin: slightly more  difficult with LLE    POSTURE: No Significant postural limitations  LOWER EXTREMITY ROM:    Pt with limited L knee extension/flexion ROM due to hx of L knee replacement   LOWER EXTREMITY MMT:    MMT Right Eval Left Eval  Hip flexion 4+ 4-  Hip extension    Hip abduction 4+ 4+  Hip adduction 5 5  Hip internal rotation    Hip external rotation    Knee flexion 5 4+  Knee extension 4+ 4-  Ankle dorsiflexion 5 5  Ankle plantarflexion    Ankle inversion    Ankle eversion    (Blank rows = not tested)  All tested in sitting   BED MOBILITY:  Pt reporting no difficulties,  performing the log roll   TRANSFERS: Sit to stand: SBA  Assistive device utilized: None     Stand to sit: SBA  Assistive device utilized: None      Able to perform with no UE support, wide BOS with LLE staggered behind R  Pt was very pleased with this, did not realize he could perform without his hands   GAIT: Findings: Gait Characteristics: step through pattern, decreased stride length, and narrow BOS, Distance walked: Clinic distances, Assistive device utilized:Single point cane and Walker - 2 wheeled, Level of assistance: Modified independence, SBA, and CGA, and Comments: mod I with RW, SBA/CGA with SPC   FUNCTIONAL TESTS:  5 times sit to stand: 17.8 seconds with no UE support  Timed up and go (TUG): 16.5 seconds with RW, 20.1 seconds with SPC  10 meter walk test: 17.8 seconds with RW = 1.84 ft/sec    [facility] PT Assessment - 01/10/24 0913       Standardized Balance Assessment   Standardized Balance Assessment Berg Balance Test      Berg Balance Test   Sit to Stand Able to stand without using hands and stabilize independently    Standing Unsupported Able to stand safely 2 minutes    Sitting with Back Unsupported but Feet Supported on Floor or Stool Able to sit safely and securely 2 minutes    Stand to Sit Sits safely with minimal use of hands    Transfers Able to transfer safely, minor use of hands     Standing Unsupported with Eyes Closed Able to stand 10 seconds safely    Standing Unsupported with Feet Together Able to place feet together independently and stand 1 minute safely    From Standing, Reach Forward with Outstretched Arm Can reach confidently >25 cm (10)    From Standing Position, Pick up Object from Floor Unable to try/needs assist to keep balance   unable to perform due to back precuations   From Standing Position, Turn to Look Behind Over each Shoulder Needs assist to keep from losing balance and falling   unable to perform due to back precuations   Turn 360 Degrees Able to turn 360 degrees safely but slowly   9.3 to R, 6.2 to L   Standing Unsupported, Alternately Place Feet on Step/Stool Needs assistance to keep from falling or unable to try    Standing Unsupported, One Foot in Front Able to plae foot ahead of the other independently and hold 30 seconds    Standing on One Leg Tries to lift leg/unable to hold 3 seconds but remains standing independently   on RLE   Total Score 38    Berg comment: 38/56 = Significant Fall Risk                                                                                                                                       TREATMENT  DATE: 01/10/24  Self-Care: Education regarding fall risk based on results of outcome measures during eval   Discussed co-pay (pt with one co-pay per day when seeing PT/OT compared to per discipline), pt would like to be seen 2x a week    PATIENT EDUCATION: Education details: POC, see Self-Care Person educated: Patient Education method: Explanation Education comprehension: verbalized understanding  HOME EXERCISE PROGRAM: From inpatient rehab, will update/revise as appropriate:   Access Code: [email or web address] URL: [email or web address] Date: 12/18/2023 Prepared by: Schuyler Batter   Exercises - Supine Hamstring Stretch with Strap  - 1 x daily - 7 x weekly - 3 sets - 10 reps - Supine Heel Slide  with Strap  - 1 x daily - 7 x weekly - 3 sets - 10 reps - Hooklying Clamshell with Resistance  - 1 x daily - 7 x weekly - 4 sets - 15 reps - Supine 90/90 Alternating Toe Touch  - 1 x daily - 7 x weekly - 3 sets - 10 reps - Dead Bug  - 1 x daily - 7 x weekly - 3 sets - 10 reps - Mini Squats with Walker and Chair  - 1 x daily - 7 x weekly - 4 sets - 10 reps - Side Stepping with Resistance at Ankles and Counter Support  - 1 x daily - 7 x weekly - 3 sets - 10 reps  GOALS: Goals reviewed with patient? Yes  SHORT TERM GOALS: Target date: 01/31/2024  Pt will be independent with initial HEP in order to build upon functional gains made in therapy. Baseline: Goal status: INITIAL  2.  Pt will improve gait speed with LRAD to at least 2.2 ft/sec in order to demo improved community mobility.   Baseline: 17.8 seconds with RW = 1.84 ft/sec Goal status: INITIAL  3.  Pt will improve TUG time to 17 seconds or less with SPC in order to demo decrease fall risk.  Baseline: 20.1 seconds with SPC Goal status: INITIAL   LONG TERM GOALS: Target date: 02/21/2024  Pt will be independent with initial HEP in order to build upon functional gains made in therapy. Baseline:  Goal status: INITIAL  2. Pt will improve BERG to at least a 46/56 in order to demo decr fall risk.  Baseline: 38/56 Goal status: INITIAL  3.   Pt will improve TUG time to 13.5 seconds or less with SPC/no AD in order to demo decrease fall risk. Baseline:  20.1 seconds with SPC Goal status: INITIAL  4.  Pt will improve gait speed with LRAD to at least 2.8 ft/sec in order to demo improved community mobility.  Baseline: 17.8 seconds with RW = 1.84 ft/sec Goal status: INITIAL  5.  Pt will improve 5x sit<>stand to less than or equal to 14 sec to demonstrate improved functional strength and transfer efficiency.  Baseline: 17.8 seconds with no UE support Goal status: INITIAL  6.  Pt will ambulate at least 500' outdoors on unlevel surfaces  with LRAD in order to demo improved community mobility.  Baseline:  Goal status: INITIAL  ASSESSMENT:  CLINICAL IMPRESSION: Patient is a 58 year old male referred to Neuro OPPT for thoracic myelopathy. Pt status post T3 transpedicular decompression, T1-T4 PSF 12/07/2023 due to severe thoracic myelopathy with progressive ambulatory functional loss. Received inpatient rehab and was discharged home. Currently using RW for mobility in the community and [facility] in his house. Prior to back issues/surgery, pt was not using any AD. Pt's PMH is significant for: L  knee arthritis, HLD, HTN, L knee replacement 2022. The following deficits were present during the exam: decr strength, impaired balance, gait abnormality, mid back pain, impaired sensation, impaired coordination, decr activity tolerance. Based on 5x sit <> stand, BERG, TUG, pt is an incr risk for falls. Unable to assess bending and twisting portion of BERG due to back precautions. With pt's gait speed with RW, pt is a limited community ambulator. Pt would benefit from skilled PT to address these impairments and functional limitations to maximize functional mobility independence and decr fall risk.    OBJECTIVE IMPAIRMENTS: Abnormal gait, decreased activity tolerance, decreased balance, decreased coordination, decreased mobility, difficulty walking, decreased ROM, decreased strength, impaired flexibility, impaired sensation, postural dysfunction, and pain.   ACTIVITY LIMITATIONS: carrying, lifting, bending, stairs, transfers, and locomotion level  PARTICIPATION LIMITATIONS: shopping, community activity, occupation, and yard work  PERSONAL FACTORS: Behavior pattern, Past/current experiences, Time since onset of injury/illness/exacerbation, and 3+ comorbidities: L knee arthritis, HLD, HTN, L knee replacement 2022 are also affecting patient's functional outcome.   REHAB POTENTIAL: Good  CLINICAL DECISION MAKING: Evolving/moderate complexity  EVALUATION  COMPLEXITY: Moderate  PLAN:  PT FREQUENCY: 2x/week  PT DURATION: 8 weeks - anticipate originally 6 weeks   PLANNED INTERVENTIONS: [id number]- PT Re-evaluation, 97110-Therapeutic exercises, 97530- Therapeutic activity, [id number]- Neuromuscular re-education, 97535- Self Care, [id number]- Manual therapy, [phone]- Gait training, [phone]- Aquatic Therapy, Patient/Family education, Balance training, Stair training, and DME instructions  PLAN FOR NEXT SESSION: how did appt with neurosurgeon go? Update/revise HEP (pt reports currently doing some exercises from inpatient rehab), work on dynamic gait with cane, balance tasks esp SLS and working on decr UE support, BLE strength esp LLE    Sheffield LOISE Senate, PT, DPT 01/10/2024, 12:26 PM

## 2024-01-07 ENCOUNTER — Inpatient Hospital Stay (HOSPITAL_COMMUNITY): Admit: 2024-01-07 | Admitting: Orthopedic Surgery

## 2024-01-07 ENCOUNTER — Encounter: Attending: Registered Nurse | Admitting: Registered Nurse

## 2024-01-07 VITALS — BP 133/86 | HR 90 | Ht 69.0 in | Wt 236.0 lb

## 2024-01-07 DIAGNOSIS — G894 Chronic pain syndrome: Secondary | ICD-10-CM | POA: Insufficient documentation

## 2024-01-07 DIAGNOSIS — M4714 Other spondylosis with myelopathy, thoracic region: Secondary | ICD-10-CM | POA: Diagnosis not present

## 2024-01-07 DIAGNOSIS — E7849 Other hyperlipidemia: Secondary | ICD-10-CM | POA: Insufficient documentation

## 2024-01-07 DIAGNOSIS — Z79899 Other long term (current) drug therapy: Secondary | ICD-10-CM | POA: Diagnosis not present

## 2024-01-07 DIAGNOSIS — Z01818 Encounter for other preprocedural examination: Secondary | ICD-10-CM

## 2024-01-07 DIAGNOSIS — Z5181 Encounter for therapeutic drug level monitoring: Secondary | ICD-10-CM | POA: Diagnosis not present

## 2024-01-07 SURGERY — DECOMPRESSIVE LUMBAR LAMINECTOMY LEVEL 1
Anesthesia: General

## 2024-01-07 MED ORDER — OXYCODONE HCL 5 MG PO TABS: 5.0000 mg | ORAL_TABLET | Freq: Four times a day (QID) | ORAL | 0 refills | Status: DC | PRN

## 2024-01-07 MED ORDER — METHOCARBAMOL 500 MG PO TABS: 500.0000 mg | ORAL_TABLET | Freq: Four times a day (QID) | ORAL | 1 refills | Status: DC | PRN

## 2024-01-07 MED ORDER — BACLOFEN 5 MG PO TABS: 5.0000 mg | ORAL_TABLET | Freq: Four times a day (QID) | ORAL | 1 refills | Status: DC

## 2024-01-07 NOTE — Patient Instructions (Signed)
 We  discuss his Hyperlipidemia:  Elevated Cholesterol  He states he doesn't want to be on medication at this time .  He will discuss with Pharmacist other options, and he is aware to check with Pharmacist before starting any medication over the counter, he verbalizes understanding.   Speak Primary Care regarding Dietician.

## 2024-01-07 NOTE — Progress Notes (Signed)
 Subjective:    Patient ID: Corey [name], male    DOB: [birth year], 58 y.o.   MRN: [id number]  HPI: Corey [name] is a 58 y.o. male who is here for HFU appointment for follow up of his  Thoracic Myelopathy, DVT and Hyperlipidemia. He presented to [facility] on 12/06/2023 with progressive bilateral lower extremities weakness.   Dr Cox: H&P HPI: Corey [name] is a 58 year old male with history of thoracic myelopathy who presents emergency department for progressive weakness.   He was evaluated by neurosurgery outpatient and was sent to the emergency department for expedited admission and treatment of severe progressive thoracic myelopathy. MR: Thoracic: 11/25/2023 IMPRESSION: 1. Multilevel thoracic spondylosis with resultant diffuse spinal stenosis at T1-2 through T3-4, severe at the T2-3 level. Secondary cord flattening/compression at T2-3 with associated cord signal changes, consistent with edema and/or myelomalacia. 2. Multifactorial degenerative changes with resultant multilevel foraminal narrowing as above. Notable findings include moderate bilateral foraminal narrowing at T2-3 and T3-4, with severe right foraminal narrowing at T11-12.  CT: Thoracic Spine IMPRESSION: 1. Degenerative spurring at T2-3, resulting in moderate central spinal canal stenosis and bilateral neural foraminal stenosis. 2. Left posterolateral bulging disc osteophyte complex at T1-2, causing moderate left-sided spinal canal and neural foraminal stenosis.  Corey [name] underwent: on 12/07/2023: Dr. Claudene  T1-T4 Posterior Spinal instrumentation and Fusion, T3 Transpedicular decompression, Additonal Lamionectomies N/A General  APPLICATION OF INTRAOPERATIVE CT SCAN    Corey [name] was admitted to inpatient Rehabilitation on 12/10/2023 and discharged on 12/20/2023. He is scheduled for Outpatient Neuro Rehabilitation.  He states his pain is located in his mid- back. He rates his pain 8.   Corey [name] Lipid Panel was  reviewed with him, and education was offered, he refuses statin at this time. He was encouraged to speak with dietician and pharmacist prior to purchasing other over the counter options, he verbalizes understanding.   Mrs. Pund in room.    Pain Inventory Average Pain 7 Pain Right Now 8 My pain is sharp, stabbing, and aching  In the last 24 hours, has pain interfered with the following? General activity 8 Relation with others 10 Enjoyment of life 10 What TIME of day is your pain at its worst? 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  weakness trouble walking spasms depression  CT/MRI Ultrasound - DVT finding  Primary care Norleen Jobs, MD, [place] Reef, MD - [employer] [facility] Neurosurgeon Penne Claudene, MD- [place]     Family History  Problem Relation Age of Onset   Alzheimer's disease Mother    Stevens-Johnson syndrome Father    Autism Brother    Colon cancer Other    Colon polyps Neg Hx    Esophageal cancer Neg Hx    Rectal cancer Neg Hx    Stomach cancer Neg Hx    Social History   Socioeconomic History   Marital status: Married    Spouse name: Not on file   Number of children: 1   Years of education: Not on file   Highest education level: Some college, no degree  Occupational History   Occupation: Health and safety inspector  Tobacco Use   Smoking status: Never   Smokeless tobacco: Never  Vaping Use   Vaping status: Never Used  Substance and Sexual  Activity   Alcohol use: Yes    Alcohol/week: 3.0 standard drinks of alcohol    Types: 3 Glasses of wine per week    Comment: occasional   Drug use: Not Currently    Types: Marijuana   Sexual activity: Yes    Partners: Female  Other Topics Concern   Not on file  Social History Narrative    Not on file   Social Drivers of Health   Financial Resource Strain: Low Risk  (08/08/2021)   Overall Financial Resource Strain (CARDIA)    Difficulty of Paying Living Expenses: Not hard at all  Food Insecurity: No Food Insecurity (12/07/2023)   Hunger Vital Sign    Worried About Running Out of Food in the Last Year: Never true    Ran Out of Food in the Last Year: Never true  Transportation Needs: No Transportation Needs (12/07/2023)   PRAPARE - Administrator, Civil Service (Medical): No    Lack of Transportation (Non-Medical): No  Physical Activity: Sufficiently Active (08/08/2021)   Exercise Vital Sign    Days of Exercise per Week: 3 days    Minutes of Exercise per Session: 90 min  Stress: Stress Concern Present (08/08/2021)   [employer] of Occupational Health - Occupational Stress Questionnaire    Feeling of Stress : To some extent  Social Connections: Moderately Integrated (12/07/2023)   Social Connection and Isolation Panel    Frequency of Communication with Friends and Family: More than three times a week    Frequency of Social Gatherings with Friends and Family: Twice a week    Attends Religious Services: More than 4 times per year    Active Member of Clubs or Organizations: Yes    Attends Banker Meetings: 1 to 4 times per year    Marital Status: Never married   Past Surgical History:  Procedure Laterality Date   ANKLE BONE SURGERY     APPLICATION OF INTRAOPERATIVE CT SCAN  12/07/2023   Procedure: APPLICATION OF INTRAOPERATIVE CT SCAN;  Surgeon: Claudene Penne ORN, MD;  Location: [facility] ORS;  Service: Neurosurgery;;   BACK SURGERY     COLLAR BONE SURGEY     COLONOSCOPY     FRACTURE SURGERY     HERNIA REPAIR     IR IVC FILTER PLMT / S&I /IMG GUID/MOD SED  12/12/2023   KNEE ARTHROSCOPY Left 2006   LAPAROSCOPIC APPENDECTOMY N/A 01/13/2021   Procedure: APPENDECTOMY LAPAROSCOPIC WITH LYSIS OF ADHESIONS;  Surgeon: Sheldon Standing, MD;  Location: [facility];   Service: General;  Laterality: N/A;   LUMBAR MICRODISCECTOMY     POLYPECTOMY     SKIN BIOPSY Left 08/11/2021   nodulocystic fat nercrosis   TOTAL KNEE ARTHROPLASTY Left 11/11/2020   Procedure: LEFT TOTAL KNEE ARTHROPLASTY;  Surgeon: Barbarann Oneil BROCKS, MD;  Location: [facility] OR;  Service: Orthopedics;  Laterality: Left;   UMBILICAL HERNIA REPAIR     Past Medical History:  Diagnosis Date   Arthritis    knee, left   ED (erectile dysfunction)    Hyperlipidemia    Hypertension    Sleep apnea    CPAP - not using lately due to recall    There were no vitals taken for this visit.  Opioid Risk Score:   Fall Risk Score:  `1  Depression screen [facility] 2/9     11/23/2023    2:36 PM 11/19/2022    1:31 PM 11/03/2021    8:24 AM 10/07/2020    8:35  AM 09/26/2018    8:20 AM 09/06/2017    8:29 AM 08/31/2016    8:30 AM  Depression screen PHQ 2/9  Decreased Interest 0 0 0 1 0 0 0  Down, Depressed, Hopeless 1 0 0 0   0  PHQ - 2 Score 1 0 0 1 0 0 0      Review of Systems  Musculoskeletal:  Positive for back pain.       L knee pain, middle back pain        Objective:   Physical Exam Vitals and nursing note reviewed.  Constitutional:      Appearance: Normal appearance.  Cardiovascular:     Rate and Rhythm: Normal rate and regular rhythm.     Pulses: Normal pulses.     Heart sounds: Normal heart sounds.  Pulmonary:     Effort: Pulmonary effort is normal.     Breath sounds: Normal breath sounds.  Musculoskeletal:     Comments: Normal Muscle Bulk and Muscle Testing Reveals:  Upper Extremities: Full ROM and Muscle Strength 5/5 Thoracic Paraspinal Tenderness: T-4-T-6 Lower Extremities: Right: Full ROM and Muscle Strength 5/5 Left Lower Extremity: Decreased ROM and Muscle Strength 5/5 Left Lower Extremity Flexion Produces Pain into his Left Patella Arises from Table slowly     Skin:    General: Skin is warm and dry.  Neurological:     Mental Status: He is alert and oriented to person, place, and  time.  Psychiatric:        Mood and Affect: Mood normal.        Behavior: Behavior normal.          Assessment & Plan:  Thoracic Myelopathy: Neurosurgery Following. S/P : on 12/07/2023: Dr Claudene  T1-T4 Posterior Spinal instrumentation and Fusion, T3 Transpedicular decompression, Additonal Lamionectomies N/A General  APPLICATION OF INTRAOPERATIVE CT SCAN     2. DVT: Continue Eliquis : PCP Following. Continue to Monitor.  3.  Hyperlipidemia.: He refuses statin at this time. He was educated on Lipid panel, he was encouraged to speak with Pharmacist and Dietician. He verbalizes understanding.   F/U with Dr. Lovorn in 4-6 weeks

## 2024-01-10 ENCOUNTER — Ambulatory Visit: Attending: Physician Assistant | Admitting: Physical Therapy

## 2024-01-10 ENCOUNTER — Encounter: Payer: Self-pay | Admitting: Physical Therapy

## 2024-01-10 ENCOUNTER — Encounter: Payer: Self-pay | Admitting: Occupational Therapy

## 2024-01-10 ENCOUNTER — Telehealth: Payer: Self-pay

## 2024-01-10 ENCOUNTER — Ambulatory Visit: Admitting: Occupational Therapy

## 2024-01-10 VITALS — BP 120/84 | HR 78

## 2024-01-10 DIAGNOSIS — R2689 Other abnormalities of gait and mobility: Secondary | ICD-10-CM | POA: Insufficient documentation

## 2024-01-10 DIAGNOSIS — R278 Other lack of coordination: Secondary | ICD-10-CM

## 2024-01-10 DIAGNOSIS — M546 Pain in thoracic spine: Secondary | ICD-10-CM | POA: Insufficient documentation

## 2024-01-10 DIAGNOSIS — M6281 Muscle weakness (generalized): Secondary | ICD-10-CM | POA: Diagnosis not present

## 2024-01-10 DIAGNOSIS — R2681 Unsteadiness on feet: Secondary | ICD-10-CM | POA: Insufficient documentation

## 2024-01-10 NOTE — Telephone Encounter (Signed)
 Patient calling to check status of FMLA paperwork. Please advise, thank you!

## 2024-01-10 NOTE — Therapy (Signed)
 OUTPATIENT OCCUPATIONAL THERAPY NEURO EVALUATION  Patient Name: Corey [name] MRN: [id number] DOB:Nov 09, 1965, 58 y.o., male Today's Date: 01/10/2024  PCP: Joyce Norleen BROCKS, MD REFERRING PROVIDER: Pegge Toribio PARAS, PA-C  END OF SESSION:  OT End of Session - 01/10/24 9057     Visit Number 1    Number of Visits 16   up to 16 visits (anticipate less O.T. needed)   Date for OT Re-Evaluation 03/11/24    Authorization Type BC/BS - VL 50 combined PT/OT    OT Start Time 0800    OT Stop Time 0840    OT Time Calculation (min) 40 min    Activity Tolerance Patient tolerated treatment well    Behavior During Therapy WFL for tasks assessed/performed          Past Medical History:  Diagnosis Date   Arthritis    knee, left   ED (erectile dysfunction)    Hyperlipidemia    Hypertension    Sleep apnea    CPAP - not using lately due to recall    Past Surgical History:  Procedure Laterality Date   ANKLE BONE SURGERY     APPLICATION OF INTRAOPERATIVE CT SCAN  12/07/2023   Procedure: APPLICATION OF INTRAOPERATIVE CT SCAN;  Surgeon: Claudene Penne ORN, MD;  Location: [facility] ORS;  Service: Neurosurgery;;   BACK SURGERY     COLLAR BONE SURGEY     COLONOSCOPY     FRACTURE SURGERY     HERNIA REPAIR     IR IVC FILTER PLMT / S&I /IMG GUID/MOD SED  12/12/2023   KNEE ARTHROSCOPY Left 2006   LAPAROSCOPIC APPENDECTOMY N/A 01/13/2021   Procedure: APPENDECTOMY LAPAROSCOPIC WITH LYSIS OF ADHESIONS;  Surgeon: Sheldon Standing, MD;  Location: [facility];  Service: General;  Laterality: N/A;   LUMBAR MICRODISCECTOMY     POLYPECTOMY     SKIN BIOPSY Left 08/11/2021   nodulocystic fat nercrosis   TOTAL KNEE ARTHROPLASTY Left 11/11/2020   Procedure: LEFT TOTAL KNEE ARTHROPLASTY;  Surgeon: Barbarann Oneil BROCKS, MD;  Location: [facility] OR;  Service: Orthopedics;  Laterality: Left;   UMBILICAL HERNIA REPAIR     Patient Active Problem List   Diagnosis Date Noted   Coping style affecting medical condition 12/15/2023   Myelopathy  concurrent with and due to spinal stenosis of thoracic region [facility]) 12/06/2023   Spinal stenosis 12/06/2023   Thoracic myelopathy 12/06/2023   Morbid obesity (HCC) 12/06/2023   Depression 12/06/2023   Radiculopathy, lumbar region 11/10/2023   Flexion contracture of knee, left 03/01/2023   Prediabetes 11/19/2022   Status post laparoscopic appendectomy 01/14/2021   H/O total knee replacement, left 11/11/2020   Leiomyoma 11/08/2020   Arthritis 09/26/2018   Onychomycosis 09/26/2018   Hyperlipidemia LDL goal <100 08/31/2016   OSA on CPAP 08/31/2016   Premature ejaculation 08/19/2011    ONSET DATE: 12/17/2023 (referral date)  REFERRING DIAG: M47.14 (ICD-10-CM) - Thoracic myelopathy  THERAPY DIAG:  Muscle weakness (generalized)  Unsteadiness on feet  Other lack of coordination  Rationale for Evaluation and Treatment: Rehabilitation  SUBJECTIVE:   SUBJECTIVE STATEMENT: I see the surgeon Wednesday so I will ask about how long I will have the back precautions Pt accompanied by: self  PERTINENT HISTORY: s/p emergent T3 transpedicular decompression T1-4 PSF. PMH of back surgery, Lt TKA 2022, hernia repair, HTN, sleep apnea.  PRECAUTIONS: Back (no BLT) and Other: no lifting > 5 lbs   WEIGHT BEARING RESTRICTIONS: No  PAIN:  Are you having pain? Yes: NPRS scale:  4/10 Pain location: Back (always in Lt knee since surgery 3 years ago)  Pain description: stabbing Aggravating factors: sitting for too long Relieving factors: laying down, medication  FALLS: Has patient fallen in last 6 months? Yes. 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  HAND DOMINANCE: Left  ADLs: Overall ADLs: some assist with LE dressing d/t back precautions and no ADL A/E except reacher Transfers/ambulation related to ADLs: Eating: independent Grooming: independent UB Dressing: independent LB Dressing: wife ties shoes, currently not wearing socks (should be wearing compression hoses) Toileting: independent Bathing: mod I  Tub Shower transfers: mod I  Equipment: Shower seat with back  IADLs: Shopping: wife doing currently Light housekeeping: wife doing currently Meal Prep: wife doing currently Community mobility: pt not released to drive yet Medication management: independent Handwriting: denies change  MOBILITY STATUS: Needs Assist: walker, cane in house    UPPER EXTREMITY ROM:  BUE AROM WNL's   UPPER EXTREMITY MMT:   grossly 5/5 BUE's   HAND FUNCTION: Grip strength: Right: 77.6 lbs; Left: 106.2 lbs  COORDINATION: 9 Hole Peg test: Right: 26 sec; Left: 28.38 sec  SENSATION: WFL  EDEMA: none in UE's   COGNITION: Overall cognitive status: Within functional limits for tasks assessed  VISION: Subjective report: no changes Baseline vision: Wears glasses all the time  PERCEPTION: Not tested  PRAXIS: Not tested  OBSERVATIONS: Pt pleasant, walks w/ walker to clinic                                                                                                                             TREATMENT DATE: 01/10/24    Pt shown LE dressing A/E including compression hose donner with attachment, hip kit, and elastic shoe laces and provided handouts, however did suggest asking surgeon (sees Wednesday) how long he would have back precautions to see if worth purchasing or having wife perform if for shorter duration.   Discussed O.T. findings and POC/goals     PATIENT  EDUCATION: Education details: see above Person educated: Patient Education method: Explanation, Verbal cues, and Handouts Education comprehension: verbalized understanding  HOME EXERCISE PROGRAM: N/A   GOALS: Goals reviewed with patient? Yes  SHORT TERM GOALS: Target date: 02/10/24  Independent with HEP for Rt grip strength, Lt coordination, and overall light UB strengthening HEP (no > 5 lbs)  Baseline: Goal status: INITIAL  2.  Pt to verbalize understanding with A/E for LE dressing to adhere to back precautions Baseline:  Goal status: IN PROGRESS -  initiated at evaluation  3.  Pt to stand for 10 min w/o rest in prep for IADLS Baseline:  Goal status: INITIAL   LONG TERM GOALS: Target date: 03/11/24  Pt to perform standing tasks for 25 min w/o rest for light IADLS Baseline:  Goal status: INITIAL  2.  Pt to return to cooking tasks and light cleaning tasks at mod I level Baseline:  Goal status: INITIAL  3.  Pt to increase Rt grip strength by 10 lbs Baseline: 77 lbs (Lt = 106 lbs)  Goal status: INITIAL  4.  Pt to improve Lt hand coordination as evidenced by reducing speed on 9 hole peg test by 3 sec or more Baseline: 28 sec.  Goal status: INITIAL   ASSESSMENT:  CLINICAL IMPRESSION: Patient is a 58 y.o. male who was seen today for occupational therapy evaluation for thoracic myelopathy s/p emergent T3 transpedicular decompression T1-4 PSF on 12/07/23 w/ back precautions. Hx includes Lt knee replacement 2022, HTN, chronic pain, multiple falls. Patient currently presents below baseline level of functioning demonstrating functional deficits and impairments as noted below. Pt would benefit from skilled OT services in the outpatient setting to work on impairments as noted below to help pt return to PLOF as able.   SABRA   PERFORMANCE DEFICITS: in functional skills including ADLs, IADLs, coordination, strength, Fine motor control, mobility, balance, body mechanics, endurance,  decreased knowledge of precautions, and decreased knowledge of use of DME.   IMPAIRMENTS: are limiting patient from ADLs, IADLs, work, and leisure.   CO-MORBIDITIES: may have co-morbidities  that affects occupational performance. Patient will benefit from skilled OT to address above impairments and improve overall function.  MODIFICATION OR ASSISTANCE TO COMPLETE EVALUATION: No modification of tasks or assist necessary to complete an evaluation.  OT OCCUPATIONAL PROFILE AND HISTORY: Detailed assessment: Review of records and additional review of physical, cognitive, psychosocial history related to current functional performance.  CLINICAL DECISION MAKING: Moderate - several treatment options, min-mod task modification necessary  REHAB POTENTIAL: Good  EVALUATION COMPLEXITY: Moderate    PLAN:  OT FREQUENCY: 1-2x/week  OT DURATION: up to 8 weeks  PLANNED INTERVENTIONS: 97535 self care/ADL training, 02889 therapeutic exercise, 97530 therapeutic activity, 97112 neuromuscular re-education, 97140 manual therapy, 97113 aquatic therapy, 97010 moist heat, balance training, functional mobility training, patient/family education, and DME and/or AE instructions  RECOMMENDED OTHER SERVICES: none at this time  CONSULTED AND AGREED WITH PLAN OF CARE: Patient  PLAN FOR NEXT SESSION: review LE dressing with A/E, follow up with patient on surgeon recommendations (how much longer w/ back precautions?), Rt grip strength and high level Lt hand coordination   Burnard JINNY Roads, OT 01/10/2024, 9:43 AM

## 2024-01-11 ENCOUNTER — Other Ambulatory Visit: Payer: Self-pay

## 2024-01-11 DIAGNOSIS — M4804 Spinal stenosis, thoracic region: Secondary | ICD-10-CM

## 2024-01-11 DIAGNOSIS — G992 Myelopathy in diseases classified elsewhere: Secondary | ICD-10-CM

## 2024-01-12 ENCOUNTER — Ambulatory Visit
Admission: RE | Admit: 2024-01-12 | Discharge: 2024-01-12 | Disposition: A | Discharge status: Home / Self Care | Attending: Neurosurgery | Admitting: Neurosurgery

## 2024-01-12 ENCOUNTER — Ambulatory Visit
Admission: RE | Admit: 2024-01-12 | Discharge: 2024-01-12 | Disposition: A | Discharge status: Home / Self Care | Source: Ambulatory Visit | Attending: Neurosurgery | Admitting: Neurosurgery

## 2024-01-12 ENCOUNTER — Encounter: Payer: Self-pay | Admitting: Registered Nurse

## 2024-01-12 ENCOUNTER — Encounter: Payer: Self-pay | Admitting: Neurosurgery

## 2024-01-12 ENCOUNTER — Ambulatory Visit: Admitting: Neurosurgery

## 2024-01-12 VITALS — BP 136/82 | Temp 99.4°F | Ht 69.0 in | Wt 235.0 lb

## 2024-01-12 DIAGNOSIS — M4804 Spinal stenosis, thoracic region: Secondary | ICD-10-CM | POA: Insufficient documentation

## 2024-01-12 DIAGNOSIS — G992 Myelopathy in diseases classified elsewhere: Secondary | ICD-10-CM | POA: Diagnosis not present

## 2024-01-12 DIAGNOSIS — F419 Anxiety disorder, unspecified: Secondary | ICD-10-CM | POA: Insufficient documentation

## 2024-01-12 DIAGNOSIS — Z981 Arthrodesis status: Secondary | ICD-10-CM

## 2024-01-12 DIAGNOSIS — I82409 Acute embolism and thrombosis of unspecified deep veins of unspecified lower extremity: Secondary | ICD-10-CM | POA: Insufficient documentation

## 2024-01-12 DIAGNOSIS — M545 Low back pain, unspecified: Secondary | ICD-10-CM | POA: Insufficient documentation

## 2024-01-12 LAB — DRUG TOX MONITOR 1 W/CONF, ORAL FLD
Amphetamines: NEGATIVE ng/mL (ref <10)
Barbiturates: NEGATIVE ng/mL (ref <10)
Benzodiazepines: NEGATIVE ng/mL (ref <0.50)
Buprenorphine: NEGATIVE ng/mL (ref <0.10)
Cocaine: NEGATIVE ng/mL (ref <5.0)
Codeine: NEGATIVE ng/mL (ref <2.5)
Dihydrocodeine: NEGATIVE ng/mL (ref <2.5)
Fentanyl: NEGATIVE ng/mL (ref <0.10)
Heroin Metabolite: NEGATIVE ng/mL (ref <1.0)
Hydrocodone: NEGATIVE ng/mL (ref <2.5)
Hydromorphone: NEGATIVE ng/mL (ref <2.5)
MARIJUANA: NEGATIVE ng/mL (ref <2.5)
MDMA: NEGATIVE ng/mL (ref <10)
Meprobamate: NEGATIVE ng/mL (ref <2.5)
Methadone: NEGATIVE ng/mL (ref <5.0)
Morphine: NEGATIVE ng/mL (ref <2.5)
Nicotine Metabolite: NEGATIVE ng/mL (ref <5.0)
Norhydrocodone: NEGATIVE ng/mL (ref <2.5)
Noroxycodone: 20.6 ng/mL — ABNORMAL HIGH (ref <2.5)
Opiates: POSITIVE ng/mL — AB (ref <2.5)
Oxycodone: 122.8 ng/mL — ABNORMAL HIGH (ref <2.5)
Oxymorphone: NEGATIVE ng/mL (ref <2.5)
Phencyclidine: NEGATIVE ng/mL (ref <10)
Tapentadol: NEGATIVE ng/mL (ref <5.0)
Tramadol: NEGATIVE ng/mL (ref <5.0)
Zolpidem: NEGATIVE ng/mL (ref <5.0)

## 2024-01-12 LAB — DRUG TOX ALC METAB W/CON, ORAL FLD: Alcohol Metabolite: NEGATIVE ng/mL (ref <25)

## 2024-01-12 NOTE — Progress Notes (Signed)
   REFERRING PHYSICIAN:  Joyce Norleen BROCKS, Md [address] [place],  [place] [address]  DOS: T1-T4 Posterior Spinal instrumentation and Fusion, T3 Transpedicular decompression, Additonal Laminectomies, 12/07/23  HISTORY OF PRESENT ILLNESS: Corey [name] is approximately 6 weeks status post T1-4 posterior spinal instrumentation and fusion, T3 transpedicular decompression due to severe thoracic myelopathy with progressive ambulatory functional loss. he is doing well.  He has had an improvement in his strength weakness numbness and tingling.  He still does have some periscapular pain as expected postoperatively.  He is working with [facility].  PHYSICAL EXAMINATION:  General: Patient is well developed, well nourished, calm, collected, and in no apparent distress.   NEUROLOGICAL:  General: In no acute distress.   Awake, alert, oriented to person, place, and time.  Pupils equal round and reactive to light.  Facial tone is symmetric.     Strength:            Side Iliopsoas Quads Hamstring PF DF EHL  R 4+ 5 5 5 5 5   L 4+ 4+ 5 5 4+ 5   Incision c/d/i   ROS (Neurologic):  Negative except as noted above  IMAGING: No new imaging prior to this appointment.  ASSESSMENT/PLAN:  Corey [name] is approximately 6 weeks status post T1-4 posterior spinal instrumentation and fusion, T3 transpedicular decompression due to severe thoracic myelopathy with progressive ambulatory functional loss.  Since surgery has had a significant improvement in his motor function.  He does continue to have symptoms consistent with myelopathy.  We discussed that this will likely be a long recovery.  He is currently walking with a walker.  His motor strength is improved significantly.  He does get some intermittent radiating pain into his shoulders and up into his neck.  He is working with physical medicine rehabilitation.  Overall doing well.  They are managing his baclofen .  He can now increase his weight restriction to  25 pounds.  Continue to wean off the pain medication.  Will continue to follow him up in clinic.  At this point he can start to do more physical activity including as tolerated movements.   Penne MICAEL Sharps, MD Department of neurosurgery

## 2024-01-19 ENCOUNTER — Ambulatory Visit: Admitting: Physical Therapy

## 2024-01-19 ENCOUNTER — Ambulatory Visit: Admitting: Occupational Therapy

## 2024-01-19 ENCOUNTER — Encounter: Payer: Self-pay | Admitting: Physical Therapy

## 2024-01-19 DIAGNOSIS — R278 Other lack of coordination: Secondary | ICD-10-CM

## 2024-01-19 DIAGNOSIS — M546 Pain in thoracic spine: Secondary | ICD-10-CM

## 2024-01-19 DIAGNOSIS — R2681 Unsteadiness on feet: Secondary | ICD-10-CM | POA: Diagnosis not present

## 2024-01-19 DIAGNOSIS — M6281 Muscle weakness (generalized): Secondary | ICD-10-CM

## 2024-01-19 DIAGNOSIS — R2689 Other abnormalities of gait and mobility: Secondary | ICD-10-CM | POA: Diagnosis not present

## 2024-01-19 NOTE — Therapy (Unsigned)
 OUTPATIENT PHYSICAL THERAPY NEURO TREATMENT   Patient Name: Corey [name] MRN: [id number] DOB:November 28, 1965, 58 y.o., male Today's Date: 01/19/2024   PCP: Joyce Norleen BROCKS, MD   REFERRING PROVIDER: Pegge Toribio PARAS, PA-C  END OF SESSION:  PT End of Session - 01/19/24 1114     Visit Number 2    Number of Visits 13    Date for PT Re-Evaluation 03/10/24    Authorization Type BLUE [employer]    PT Start Time 1106    PT Stop Time 1149    PT Time Calculation (min) 43 min    Equipment Utilized During Treatment Gait belt    Activity Tolerance Patient tolerated treatment well    Behavior During Therapy WFL for tasks assessed/performed          Past Medical History:  Diagnosis Date   Arthritis    knee, left   ED (erectile dysfunction)    Hyperlipidemia    Hypertension    Sleep apnea    CPAP - not using lately due to recall    Past Surgical History:  Procedure Laterality Date   ANKLE BONE SURGERY     APPLICATION OF INTRAOPERATIVE CT SCAN  12/07/2023   Procedure: APPLICATION OF INTRAOPERATIVE CT SCAN;  Surgeon: Claudene Penne ORN, MD;  Location: [facility] ORS;  Service: Neurosurgery;;   BACK SURGERY     COLLAR BONE SURGEY     COLONOSCOPY     FRACTURE SURGERY     HERNIA REPAIR     IR IVC FILTER PLMT / S&I /IMG GUID/MOD SED  12/12/2023   KNEE ARTHROSCOPY Left 2006   LAPAROSCOPIC APPENDECTOMY N/A 01/13/2021   Procedure: APPENDECTOMY LAPAROSCOPIC WITH LYSIS OF ADHESIONS;  Surgeon: Sheldon Standing, MD;  Location: [facility];  Service: General;  Laterality: N/A;   LUMBAR MICRODISCECTOMY     POLYPECTOMY     SKIN BIOPSY Left 08/11/2021   nodulocystic fat nercrosis   TOTAL KNEE ARTHROPLASTY Left 11/11/2020   Procedure: LEFT TOTAL KNEE ARTHROPLASTY;  Surgeon: Barbarann Oneil BROCKS, MD;  Location: [facility];  Service: Orthopedics;  Laterality: Left;   UMBILICAL HERNIA REPAIR     Patient Active Problem List   Diagnosis Date Noted   Anxiety 01/12/2024   Chronic low back pain 01/12/2024   Acute  embolism and thrombosis of unspecified deep veins of unspecified lower extremity (HCC) 01/12/2024   Coping style affecting medical condition 12/15/2023   Myelopathy concurrent with and due to spinal stenosis of thoracic region [facility]) 12/06/2023   Spinal stenosis 12/06/2023   Thoracic myelopathy 12/06/2023   Morbid obesity (HCC) 12/06/2023   Depression 12/06/2023   Radiculopathy, lumbar region 11/10/2023   Flexion contracture of knee, left 03/01/2023   Prediabetes 11/19/2022   Status post laparoscopic appendectomy 01/14/2021   H/O total knee replacement, left 11/11/2020   Leiomyoma 11/08/2020   Arthritis 09/26/2018   Onychomycosis 09/26/2018   Hyperlipidemia LDL goal <100 08/31/2016   OSA on CPAP 08/31/2016   Premature ejaculation 08/19/2011    ONSET DATE: 12/16/2023  REFERRING DIAG: M47.14 (ICD-10-CM) - Thoracic myelopathy  THERAPY DIAG:  Muscle weakness (generalized)  Unsteadiness on feet  Other lack of coordination  Pain in thoracic spine  Other abnormalities of gait and mobility  Rationale for Evaluation and Treatment: Rehabilitation  SUBJECTIVE:  SUBJECTIVE STATEMENT: Pt saw his neurosurgeon and reports that they lifted BLT restrictions - lifting progressed to 25lbs.  He continues to use his rollator full-time for ambulatory activity and transfers.  He denies falls, near falls or acute changes.  Reports his incision is well healing.  No pain today, just some back tightness. Pt accompanied by: self and wife Synthia)  PERTINENT HISTORY: Status post T3 transpedicular decompression, T1-T4 PSF 12/07/2023 due to severe thoracic myelopathy with progressive ambulatory functional loss per Dr. Penne Sharps neurosurgery.NO BRACE REQUIRED. Continues to have some pain in his mid back up to his neck. He  does feel as though the weakness in his legs is improving however he continues to use a rollator. No new weakness, numbness or tingling.Of note his operative course was complicated after shown to have a DVT postoperatively on day 3. He continues to take Eliquis  for this   Got inpatient rehab and was discharged home 12/20/23  PMH: L knee arthritis, HLD, HTN, L knee replacement 2022  PAIN:  Are you having pain? No - just back tightness (mild)  There were no vitals filed for this visit.    PRECAUTIONS: Back  Precaution/Restrictions Comments: spinal precautions, no brace needed per orders - spinal precautions lifted at 8/13 visit - can lift up to 25 lbs, can bend and twist to tolerance  FALLS: Has patient fallen in last 6 months? Yes. Number of falls 20, did not injure himself, legs were weak and didn't have strength in his legs  LIVING ENVIRONMENT: Lives with: lives with their spouse Lives in: House/apartment Stairs: Yes: External: 2 steps; none Has following equipment at home: Single point cane, Walker - 2 wheeled, Environmental consultant - 4 wheeled, shower chair, and raised grab bars  PLOF: Independent, Vocation/Vocational requirements: Estate agent , and Leisure: Working out, taking walks   PATIENT GOALS: Wants to get back to being independent, getting his strength back and walking on his own   OBJECTIVE:  Note: Objective measures were completed at Evaluation unless otherwise noted.  COGNITION: Overall cognitive status: Within functional limits for tasks assessed   SENSATION: Light touch: WFL Proprioception: WFL and with ankle DF/PF, noted some bouts of clonus bilat when testing into ankle DF   Pt reporting numbness/tingling in L knee and both feet have numbness/tingling and coldness  Sometimes if he rubs up against something will feel warmth   COORDINATION: Heel to shin: slightly more difficult with LLE    POSTURE: No Significant postural limitations  LOWER EXTREMITY ROM:    Pt  with limited L knee extension/flexion ROM due to hx of L knee replacement   LOWER EXTREMITY MMT:    MMT Right Eval Left Eval  Hip flexion 4+ 4-  Hip extension    Hip abduction 4+ 4+  Hip adduction 5 5  Hip internal rotation    Hip external rotation    Knee flexion 5 4+  Knee extension 4+ 4-  Ankle dorsiflexion 5 5  Ankle plantarflexion    Ankle inversion    Ankle eversion    (Blank rows = not tested)  All tested in sitting   BED MOBILITY:  Pt reporting no difficulties, performing the log roll   TRANSFERS: Sit to stand: SBA  Assistive device utilized: None     Stand to sit: SBA  Assistive device utilized: None      Able to perform with no UE support, wide BOS with LLE staggered behind R  Pt was very pleased with this, did not realize  he could perform without his hands   GAIT: Findings: Gait Characteristics: step through pattern, decreased stride length, and narrow BOS, Distance walked: Clinic distances, Assistive device utilized:Single point cane and Walker - 2 wheeled, Level of assistance: Modified independence, SBA, and CGA, and Comments: mod I with RW, SBA/CGA with SPC   FUNCTIONAL TESTS:  5 times sit to stand: 17.8 seconds with no UE support  Timed up and go (TUG): 16.5 seconds with RW, 20.1 seconds with SPC  10 meter walk test: 17.8 seconds with RW = 1.84 ft/sec                                                                                                                                   TREATMENT DATE: 01/19/24 Reviewed and modified existing HEP: -Supine hamstring stretch w/ strap moving into adduction/abduction for medial/lateral bias x1-2 minutes each side -Supine heel slides w/ strap x15 each side, LLE limited by prior TKA -Hooklying clamshells w/ blue theraband x15 -Supine 90/90 toe touch x20 -Dead bug x30 w/ cuing for form and coordination -Mini squats no UE support x10 - edu on continuing to have walker/counter in front and seat behind him due to mild  instability on return to upright -Lateral stepping at counter w/ blue band at ankles 2x8 ft each direction -Forward and backward monster walk at counter w/ blue band at ankles 2x8 ft each direction  -STS w/o UE support into calf raise (able to achieve increased ankle ROM w/ repetition) x12  PATIENT EDUCATION: Education details: Continue HEP w/ modifications. Person educated: Patient Education method: Explanation Education comprehension: verbalized understanding  HOME EXERCISE PROGRAM:  Access Code: [email or web address] URL: [email or web address] Date: 12/18/2023 Prepared by: Schuyler Batter   Exercises - Supine Hamstring Stretch with Strap  - 1 x daily - 7 x weekly - 3 sets - 10 reps  - Supine Heel Slide with Strap  - 1 x daily - 7 x weekly - 3 sets - 10 reps - Supine 90/90 Alternating Toe Touch  - 1 x daily - 7 x weekly - 3 sets - 10 reps - Dead Bug  - 1 x daily - 7 x weekly - 3 sets - 10 reps - Mini Squats with Walker and Chair  - 1 x daily - 7 x weekly - 4 sets - 10 reps - Side Stepping with Resistance at Ankles and Counter Support  - 1 x daily - 7 x weekly - 3 sets - 10 reps - Forward and Backward Monster Walk with Counter Support  - 1 x daily - 7 x weekly - 3 sets - 10 reps  GOALS: Goals reviewed with patient? Yes  SHORT TERM GOALS: Target date: 01/31/2024  Pt will be independent with initial HEP in order to build upon functional gains made in therapy. Baseline: Goal status: INITIAL  2.  Pt will improve gait speed with LRAD to at least 2.2 ft/sec in order to demo  improved community mobility.   Baseline: 17.8 seconds with RW = 1.84 ft/sec Goal status: INITIAL  3.  Pt will improve TUG time to 17 seconds or less with SPC in order to demo decrease fall risk.  Baseline: 20.1 seconds with SPC Goal status: INITIAL   LONG TERM GOALS: Target date: 02/21/2024  Pt will be independent with initial HEP in order to build upon functional gains made in therapy. Baseline:  Goal  status: INITIAL  2. Pt will improve BERG to at least a 46/56 in order to demo decr fall risk.  Baseline: 38/56 Goal status: INITIAL  3.   Pt will improve TUG time to 13.5 seconds or less with SPC/no AD in order to demo decrease fall risk. Baseline:  20.1 seconds with SPC Goal status: INITIAL  4.  Pt will improve gait speed with LRAD to at least 2.8 ft/sec in order to demo improved community mobility.  Baseline: 17.8 seconds with RW = 1.84 ft/sec Goal status: INITIAL  5.  Pt will improve 5x sit<>stand to less than or equal to 14 sec to demonstrate improved functional strength and transfer efficiency.  Baseline: 17.8 seconds with no UE support Goal status: INITIAL  6.  Pt will ambulate at least 500' outdoors on unlevel surfaces with LRAD in order to demo improved community mobility.  Baseline:  Goal status: INITIAL  ASSESSMENT:  CLINICAL IMPRESSION: Patient is a 58 year old male referred to Neuro OPPT for thoracic myelopathy. Pt status post T3 transpedicular decompression, T1-T4 PSF 12/07/2023 due to severe thoracic myelopathy with progressive ambulatory functional loss. Received inpatient rehab and was discharged home. Currently using RW for mobility in the community and [facility] in his house. Prior to back issues/surgery, pt was not using any AD. Pt's PMH is significant for: L knee arthritis, HLD, HTN, L knee replacement 2022. The following deficits were present during the exam: decr strength, impaired balance, gait abnormality, mid back pain, impaired sensation, impaired coordination, decr activity tolerance. Based on 5x sit <> stand, BERG, TUG, pt is an incr risk for falls. Unable to assess bending and twisting portion of BERG due to back precautions. With pt's gait speed with RW, pt is a limited community ambulator. Pt would benefit from skilled PT to address these impairments and functional limitations to maximize functional mobility independence and decr fall risk.    OBJECTIVE  IMPAIRMENTS: Abnormal gait, decreased activity tolerance, decreased balance, decreased coordination, decreased mobility, difficulty walking, decreased ROM, decreased strength, impaired flexibility, impaired sensation, postural dysfunction, and pain.   ACTIVITY LIMITATIONS: carrying, lifting, bending, stairs, transfers, and locomotion level  PARTICIPATION LIMITATIONS: shopping, community activity, occupation, and yard work  PERSONAL FACTORS: Behavior pattern, Past/current experiences, Time since onset of injury/illness/exacerbation, and 3+ comorbidities: L knee arthritis, HLD, HTN, L knee replacement 2022 are also affecting patient's functional outcome.   REHAB POTENTIAL: Good  CLINICAL DECISION MAKING: Evolving/moderate complexity  EVALUATION COMPLEXITY: Moderate  PLAN:  PT FREQUENCY: 2x/week  PT DURATION: 8 weeks - anticipate originally 6 weeks   PLANNED INTERVENTIONS: 97164- PT Re-evaluation, 97110-Therapeutic exercises, 97530- Therapeutic activity, [id number]- Neuromuscular re-education, 97535- Self Care, [id number]- Manual therapy, [phone]- Gait training, [phone]- Aquatic Therapy, Patient/Family education, Balance training, Stair training, and DME instructions  PLAN FOR NEXT SESSION: how did appt with neurosurgeon go? Update/revise HEP (pt reports currently doing some exercises from inpatient rehab), work on dynamic gait with cane, balance tasks esp SLS and working on decr UE support, BLE strength esp LLE    Daved KATHEE Bull, PT,  DPT 01/19/2024, 11:50 AM

## 2024-01-19 NOTE — Therapy (Signed)
 OUTPATIENT OCCUPATIONAL THERAPY NEURO TREATMENT  Patient Name: Corey [name] MRN: [id number] DOB:09-11-1965, 58 y.o., male Today's Date: 01/19/2024  PCP: Joyce Norleen BROCKS, MD REFERRING PROVIDER: Pegge Toribio PARAS, PA-C  END OF SESSION:  OT End of Session - 01/19/24 1150     Visit Number 2    Number of Visits 16   up to 16 visits (anticipate less O.T. needed)   Date for OT Re-Evaluation 03/11/24    Authorization Type BC/BS - VL 50 combined PT/OT    OT Start Time 1149    OT Stop Time 1230    OT Time Calculation (min) 41 min    Activity Tolerance Patient tolerated treatment well    Behavior During Therapy WFL for tasks assessed/performed          Past Medical History:  Diagnosis Date   Arthritis    knee, left   ED (erectile dysfunction)    Hyperlipidemia    Hypertension    Sleep apnea    CPAP - not using lately due to recall    Past Surgical History:  Procedure Laterality Date   ANKLE BONE SURGERY     APPLICATION OF INTRAOPERATIVE CT SCAN  12/07/2023   Procedure: APPLICATION OF INTRAOPERATIVE CT SCAN;  Surgeon: Claudene Penne ORN, MD;  Location: [facility] ORS;  Service: Neurosurgery;;   BACK SURGERY     COLLAR BONE SURGEY     COLONOSCOPY     FRACTURE SURGERY     HERNIA REPAIR     IR IVC FILTER PLMT / S&I /IMG GUID/MOD SED  12/12/2023   KNEE ARTHROSCOPY Left 2006   LAPAROSCOPIC APPENDECTOMY N/A 01/13/2021   Procedure: APPENDECTOMY LAPAROSCOPIC WITH LYSIS OF ADHESIONS;  Surgeon: Sheldon Standing, MD;  Location: [facility];  Service: General;  Laterality: N/A;   LUMBAR MICRODISCECTOMY     POLYPECTOMY     SKIN BIOPSY Left 08/11/2021   nodulocystic fat nercrosis   TOTAL KNEE ARTHROPLASTY Left 11/11/2020   Procedure: LEFT TOTAL KNEE ARTHROPLASTY;  Surgeon: Barbarann Oneil BROCKS, MD;  Location: [facility];  Service: Orthopedics;  Laterality: Left;   UMBILICAL HERNIA REPAIR     Patient Active Problem List   Diagnosis Date Noted   Anxiety 01/12/2024   Chronic low back pain 01/12/2024   Acute  embolism and thrombosis of unspecified deep veins of unspecified lower extremity (HCC) 01/12/2024   Coping style affecting medical condition 12/15/2023   Myelopathy concurrent with and due to spinal stenosis of thoracic region [facility]) 12/06/2023   Spinal stenosis 12/06/2023   Thoracic myelopathy 12/06/2023   Morbid obesity (HCC) 12/06/2023   Depression 12/06/2023   Radiculopathy, lumbar region 11/10/2023   Flexion contracture of knee, left 03/01/2023   Prediabetes 11/19/2022   Status post laparoscopic appendectomy 01/14/2021   H/O total knee replacement, left 11/11/2020   Leiomyoma 11/08/2020   Arthritis 09/26/2018   Onychomycosis 09/26/2018   Hyperlipidemia LDL goal <100 08/31/2016   OSA on CPAP 08/31/2016   Premature ejaculation 08/19/2011    ONSET DATE: 12/17/2023 (referral date)  REFERRING DIAG: M47.14 (ICD-10-CM) - Thoracic myelopathy  THERAPY DIAG:  Muscle weakness (generalized)  Other lack of coordination  Rationale for Evaluation and Treatment: Rehabilitation  SUBJECTIVE:   SUBJECTIVE STATEMENT: He feels like his fingernails on his L hand prevented him from performing as well with 9 hole peg testing at eval.   Pt accompanied by: self  PERTINENT HISTORY: s/p emergent T3 transpedicular decompression T1-4 PSF. PMH of back surgery, Lt TKA 2022, hernia repair, HTN, sleep  apnea.  PRECAUTIONS: Other: no lifting > 25 lbs   WEIGHT BEARING RESTRICTIONS: No  PAIN:  Are you having pain? No  FALLS: Has patient fallen in last 6 months? Yes. 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  HAND DOMINANCE: Left  ADLs: Overall ADLs: some assist with LE dressing d/t back precautions and no ADL A/E except reacher Transfers/ambulation related to ADLs: Eating: independent Grooming: independent UB Dressing: independent LB Dressing: wife ties shoes, currently not wearing socks (should be wearing compression hoses) Toileting: independent Bathing: mod I  Tub Shower transfers: mod I  Equipment: Shower seat with back  IADLs: Shopping: wife doing currently Light housekeeping: wife doing currently Meal Prep: wife doing currently Community mobility: pt not released to drive yet Medication management: independent Handwriting: denies change  MOBILITY STATUS: Needs Assist: walker, cane in house  UPPER EXTREMITY ROM:  BUE AROM WNL's  UPPER EXTREMITY MMT:   grossly 5/5 BUE's  HAND FUNCTION: Grip strength: Right: 77.6 lbs; Left: 106.2 lbs  COORDINATION: 9 Hole Peg test: Right: 26 sec; Left: 28.38 sec  SENSATION: WFL  EDEMA: none in UE's  COGNITION: Overall cognitive status: Within functional limits for tasks assessed  VISION: Subjective report: no changes Baseline vision: Wears glasses all the time  PERCEPTION: Not tested  PRAXIS: Not tested  OBSERVATIONS: Pt pleasant, walks w/ walker to clinic                                                                                                                           TREATMENT:  - Self-care/home management completed for duration as noted below including: Reviewed status of back precautions and LB dressing. Education provided on stocking donning as noted in pt instructions.  - Therapeutic exercises completed for duration as noted below including: OT initiated LUE coordination HOME EXERCISE PROGRAM as noted in patient instructions including pick up and placement of items, shuffling and turning cards, item stacking and  unstacking, rolling a piece of tissue paper into a ball, rolling golf balls in hand, rolling a pen in between fingers and thumb, picking up and storing items in hand, and then translating stored items to tips of thumb and index finger before placing them individually into a container. Patient able to return demonstration of all exercises. Handout provided.  OT initiated RUE and LUE green theraputty exercises (search, grip, and pinch) as noted in patient instructions for coordination and strength  - Therapeutic activities completed for duration as noted below including: OT educated pt on table top  play of Golf Solitaire for RUE and LUE to address fine motor coordination, gross motor coordination, upper extremity range of motion, pain reduction, scanning and locating of items, processing, bimanual coordination/trunk control, sequencing of unfamiliar motor movements or tasks, motor planning, and endurance/stamina. Pt required minimal cues for proper play.   PATIENT EDUCATION: Education details: see above Person educated: Patient Education method: Programmer, multimedia, Demonstration, Verbal cues, and Handouts Education comprehension: verbalized understanding, returned demonstration, verbal cues required, and needs further education  HOME EXERCISE PROGRAM: 01/19/2024: putty; coordination; golf solitaire; compression donning  GOALS: Goals reviewed with patient? Yes  SHORT TERM GOALS: Target date: 02/10/24  Independent with HEP for Rt grip strength, Lt coordination, and overall light UB strengthening HEP (no > 25 lbs)  Baseline: 01/19/2024: updated per updated spinal precautions Goal status: IN PROGRESS  2.  Pt to verbalize understanding with A/E for LE dressing to adhere to back precautions Baseline:  Goal status: MET and discontinued as pt no longer has spinal precautions 01/19/2024  3.  Pt to stand for 10 min w/o rest in prep for IADLS Baseline:  Goal status: INITIAL   LONG TERM GOALS: Target date:  03/11/24  Pt to perform standing tasks for 25 min w/o rest for light IADLS Baseline:  Goal status: INITIAL  2.  Pt to return to cooking tasks and light cleaning tasks at mod I level Baseline:  Goal status: INITIAL  3.  Pt to increase Rt grip strength by 10 lbs Baseline: 77 lbs (Lt = 106 lbs)  Goal status: INITIAL  4.  Pt to improve Lt hand coordination as evidenced by reducing speed on 9 hole peg test by 3 sec or more Baseline: 28 sec.  Goal status: INITIAL   ASSESSMENT:  CLINICAL IMPRESSION: Patient demonstrates good understanding of HEP as needed to progress towards goals. Mild limitations with processing speed today. Will continue to monitor.  SABRA  PERFORMANCE DEFICITS: in functional skills including ADLs, IADLs, coordination, strength, Fine motor control, mobility, balance, body mechanics, endurance, decreased knowledge of precautions, and decreased knowledge of use of DME.   IMPAIRMENTS: are limiting patient from ADLs, IADLs, work, and leisure.   CO-MORBIDITIES: may have co-morbidities  that affects occupational performance. Patient will benefit from skilled OT to address above impairments and improve overall function.  REHAB POTENTIAL: Good  PLAN:  OT FREQUENCY: 1-2x/week  OT DURATION: up to 8 weeks  PLANNED INTERVENTIONS: 97535 self care/ADL training, 02889 therapeutic exercise, 97530 therapeutic activity, 97112 neuromuscular re-education, 97140 manual therapy, 97113 aquatic therapy, 97010 moist heat, balance training, functional mobility training, patient/family education, and DME and/or AE instructions  RECOMMENDED OTHER SERVICES: none at this time  CONSULTED AND AGREED WITH PLAN OF CARE: Patient  PLAN FOR NEXT SESSION: Have pt stand to review golf solitaire per STG #3 (10 min); Review putty and coordination HEPs PRN   Jocelyn CHRISTELLA Bottom, OT 01/19/2024, 1:25 PM

## 2024-01-19 NOTE — Patient Instructions (Signed)
 Access Code: [email or web address] URL: [email or web address] Date: 12/18/2023 Prepared by: Schuyler Batter   Exercises - Supine Hamstring Stretch with Strap  - 1 x daily - 7 x weekly - 3 sets - 10 reps  - Supine Heel Slide with Strap  - 1 x daily - 7 x weekly - 3 sets - 10 reps - Supine 90/90 Alternating Toe Touch  - 1 x daily - 7 x weekly - 3 sets - 10 reps - Dead Bug  - 1 x daily - 7 x weekly - 3 sets - 10 reps - Mini Squats with Walker and Chair  - 1 x daily - 7 x weekly - 4 sets - 10 reps - Side Stepping with Resistance at Ankles and Counter Support  - 1 x daily - 7 x weekly - 3 sets - 10 reps - Forward and Backward Monster Walk with Counter Support  - 1 x daily - 7 x weekly - 3 sets - 10 reps

## 2024-01-19 NOTE — Patient Instructions (Addendum)
 SABRA

## 2024-01-20 ENCOUNTER — Encounter: Admitting: Orthopedic Surgery

## 2024-01-25 ENCOUNTER — Encounter: Payer: Self-pay | Admitting: Occupational Therapy

## 2024-01-25 ENCOUNTER — Encounter: Payer: Self-pay | Admitting: Physical Therapy

## 2024-01-25 ENCOUNTER — Ambulatory Visit: Admitting: Physical Therapy

## 2024-01-25 ENCOUNTER — Ambulatory Visit: Admitting: Occupational Therapy

## 2024-01-25 DIAGNOSIS — M6281 Muscle weakness (generalized): Secondary | ICD-10-CM

## 2024-01-25 DIAGNOSIS — R2681 Unsteadiness on feet: Secondary | ICD-10-CM | POA: Diagnosis not present

## 2024-01-25 DIAGNOSIS — R2689 Other abnormalities of gait and mobility: Secondary | ICD-10-CM

## 2024-01-25 DIAGNOSIS — R278 Other lack of coordination: Secondary | ICD-10-CM

## 2024-01-25 DIAGNOSIS — M546 Pain in thoracic spine: Secondary | ICD-10-CM | POA: Diagnosis not present

## 2024-01-25 NOTE — Therapy (Signed)
 OUTPATIENT PHYSICAL THERAPY NEURO TREATMENT   Patient Name: Corey [name] MRN: [id number] DOB:1965/10/06, 58 y.o., male Today's Date: 01/25/2024   PCP: Joyce Norleen BROCKS, MD   REFERRING PROVIDER: Pegge Toribio PARAS, PA-C  END OF SESSION:  PT End of Session - 01/25/24 0804     Visit Number 3    Number of Visits 13    Date for PT Re-Evaluation 03/10/24    Authorization Type [employer]    PT Start Time 0802    PT Stop Time 0842    PT Time Calculation (min) 40 min    Equipment Utilized During Treatment Gait belt    Activity Tolerance Patient tolerated treatment well    Behavior During Therapy WFL for tasks assessed/performed          Past Medical History:  Diagnosis Date   Arthritis    knee, left   ED (erectile dysfunction)    Hyperlipidemia    Hypertension    Sleep apnea    CPAP - not using lately due to recall    Past Surgical History:  Procedure Laterality Date   ANKLE BONE SURGERY     APPLICATION OF INTRAOPERATIVE CT SCAN  12/07/2023   Procedure: APPLICATION OF INTRAOPERATIVE CT SCAN;  Surgeon: Claudene Penne ORN, MD;  Location: [facility];  Service: Neurosurgery;;   BACK SURGERY     COLLAR BONE SURGEY     COLONOSCOPY     FRACTURE SURGERY     HERNIA REPAIR     IR IVC FILTER PLMT / S&I /IMG GUID/MOD SED  12/12/2023   KNEE ARTHROSCOPY Left 2006   LAPAROSCOPIC APPENDECTOMY N/A 01/13/2021   Procedure: APPENDECTOMY LAPAROSCOPIC WITH LYSIS OF ADHESIONS;  Surgeon: Sheldon Standing, MD;  Location: [facility];  Service: General;  Laterality: N/A;   LUMBAR MICRODISCECTOMY     POLYPECTOMY     SKIN BIOPSY Left 08/11/2021   nodulocystic fat nercrosis   TOTAL KNEE ARTHROPLASTY Left 11/11/2020   Procedure: LEFT TOTAL KNEE ARTHROPLASTY;  Surgeon: Barbarann Oneil BROCKS, MD;  Location: [facility];  Service: Orthopedics;  Laterality: Left;   UMBILICAL HERNIA REPAIR     Patient Active Problem List   Diagnosis Date Noted   Anxiety 01/12/2024   Chronic low back pain 01/12/2024   Acute  embolism and thrombosis of unspecified deep veins of unspecified lower extremity (HCC) 01/12/2024   Coping style affecting medical condition 12/15/2023   Myelopathy concurrent with and due to spinal stenosis of thoracic region [facility]) 12/06/2023   Spinal stenosis 12/06/2023   Thoracic myelopathy 12/06/2023   Morbid obesity (HCC) 12/06/2023   Depression 12/06/2023   Radiculopathy, lumbar region 11/10/2023   Flexion contracture of knee, left 03/01/2023   Prediabetes 11/19/2022   Status post laparoscopic appendectomy 01/14/2021   H/O total knee replacement, left 11/11/2020   Leiomyoma 11/08/2020   Arthritis 09/26/2018   Onychomycosis 09/26/2018   Hyperlipidemia LDL goal <100 08/31/2016   OSA on CPAP 08/31/2016   Premature ejaculation 08/19/2011    ONSET DATE: 12/16/2023  REFERRING DIAG: M47.14 (ICD-10-CM) - Thoracic myelopathy  THERAPY DIAG:  Muscle weakness (generalized)  Unsteadiness on feet  Other lack of coordination  Other abnormalities of gait and mobility  Rationale for Evaluation and Treatment: Rehabilitation  SUBJECTIVE:  SUBJECTIVE STATEMENT: Uses cane in the house. Brought his rollator into clinic. Exercises are going well, can really feel the core exercises.   Pt accompanied by: self   PERTINENT HISTORY: Status post T3 transpedicular decompression, T1-T4 PSF 12/07/2023 due to severe thoracic myelopathy with progressive ambulatory functional loss per Dr. Penne Sharps neurosurgery.NO BRACE REQUIRED. Continues to have some pain in his mid back up to his neck. He does feel as though the weakness in his legs is improving however he continues to use a rollator. No new weakness, numbness or tingling.Of note his operative course was complicated after shown to have a DVT postoperatively on day  3. He continues to take Eliquis  for this   Got inpatient rehab and was discharged home 12/20/23  PMH: L knee arthritis, HLD, HTN, L knee replacement 2022  PAIN:  Are you having pain? No - just back tightness (mild)  There were no vitals filed for this visit.    PRECAUTIONS: Back  Precaution/Restrictions Comments: spinal precautions, no brace needed per orders - spinal precautions lifted at 8/13 visit - can lift up to 25 lbs, can bend and twist to tolerance  FALLS: Has patient fallen in last 6 months? Yes. Number of falls 20, did not injure himself, legs were weak and didn't have strength in his legs  LIVING ENVIRONMENT: Lives with: lives with their spouse Lives in: House/apartment Stairs: Yes: External: 2 steps; none Has following equipment at home: Single point cane, Walker - 2 wheeled, Environmental consultant - 4 wheeled, shower chair, and raised grab bars  PLOF: Independent, Vocation/Vocational requirements: Estate agent , and Leisure: Working out, taking walks   PATIENT GOALS: Wants to get back to being independent, getting his strength back and walking on his own   OBJECTIVE:  Note: Objective measures were completed at Evaluation unless otherwise noted.  COGNITION: Overall cognitive status: Within functional limits for tasks assessed   SENSATION: Light touch: WFL Proprioception: WFL and with ankle DF/PF, noted some bouts of clonus bilat when testing into ankle DF   Pt reporting numbness/tingling in L knee and both feet have numbness/tingling and coldness  Sometimes if he rubs up against something will feel warmth   COORDINATION: Heel to shin: slightly more difficult with LLE    POSTURE: No Significant postural limitations  LOWER EXTREMITY ROM:    Pt with limited L knee extension/flexion ROM due to hx of L knee replacement   LOWER EXTREMITY MMT:    MMT Right Eval Left Eval  Hip flexion 4+ 4-  Hip extension    Hip abduction 4+ 4+  Hip adduction 5 5  Hip internal  rotation    Hip external rotation    Knee flexion 5 4+  Knee extension 4+ 4-  Ankle dorsiflexion 5 5  Ankle plantarflexion    Ankle inversion    Ankle eversion    (Blank rows = not tested)  All tested in sitting   BED MOBILITY:  Pt reporting no difficulties, performing the log roll   TRANSFERS: Sit to stand: SBA  Assistive device utilized: None     Stand to sit: SBA  Assistive device utilized: None      Able to perform with no UE support, wide BOS with LLE staggered behind R  Pt was very pleased with this, did not realize he could perform without his hands   GAIT: Findings: Gait Characteristics: step through pattern, decreased stride length, and narrow BOS, Distance walked: Clinic distances, Assistive device utilized:Single point cane and Environmental consultant -  2 wheeled, Level of assistance: Modified independence, SBA, and CGA, and Comments: mod I with RW, SBA/CGA with SPC   FUNCTIONAL TESTS:  5 times sit to stand: 17.8 seconds with no UE support  Timed up and go (TUG): 16.5 seconds with RW, 20.1 seconds with SPC  10 meter walk test: 17.8 seconds with RW = 1.84 ft/sec                                                                                                                                   TREATMENT DATE: 01/25/24  Therapeutic Exercise:  SciFit with BUE/BLE Multi-Peaks at Gear 4.0 > 6.0 for 8 minutes for strengthening, ROM, activity tolerance. Pt reporting 6/10 RPE during the hills.   At bottom of staircase at 6 step: 2 x 10 reps forward step ups leading with each leg, fingertip support, pt more challenged with RLE Alternating SLS taps to 1st step, performed 2 sets of 10 alternating legs, fingertip support > none, CGA   NMR: On air ex: Feet together EO 10 reps head turns, 10 reps head nods Feet together EC 3 x 30 seconds, incr postural sway  Alternating lateral steps on and off alternating for 10 reps, performed an additional 2 x 10 reps with having to step over 4 obstacle  for incr foot clearance/SLS stability, pt did well using ankle/hip strategy to maintain balance  Sit <> stands from mat table, pt needing to use BUE support to stand and no UE support when lowering, last 2 reps pt able to perform without UE support for a total of 10 reps    PATIENT EDUCATION: Education details: Continue HEP Person educated: Patient Education method: Explanation Education comprehension: verbalized understanding  HOME EXERCISE PROGRAM:  Access Code: [email or web address] URL: [email or web address] Date: 12/18/2023 Prepared by: Schuyler Batter   Exercises - Supine Hamstring Stretch with Strap  - 1 x daily - 7 x weekly - 3 sets - 10 reps  - Supine Heel Slide with Strap  - 1 x daily - 7 x weekly - 3 sets - 10 reps - Supine 90/90 Alternating Toe Touch  - 1 x daily - 7 x weekly - 3 sets - 10 reps - Dead Bug  - 1 x daily - 7 x weekly - 3 sets - 10 reps - Mini Squats with Walker and Chair  - 1 x daily - 7 x weekly - 4 sets - 10 reps - Side Stepping with Resistance at Ankles and Counter Support  - 1 x daily - 7 x weekly - 3 sets - 10 reps - Forward and Backward Monster Walk with Counter Support  - 1 x daily - 7 x weekly - 3 sets - 10 reps  GOALS: Goals reviewed with patient? Yes  SHORT TERM GOALS: Target date: 01/31/2024  Pt will be independent with initial HEP in order to build upon functional gains made in therapy. Baseline: Goal status: INITIAL  2.  Pt will improve gait speed with LRAD to at least 2.2 ft/sec in order to demo improved community mobility.   Baseline: 17.8 seconds with RW = 1.84 ft/sec Goal status: INITIAL  3.  Pt will improve TUG time to 17 seconds or less with SPC in order to demo decrease fall risk.  Baseline: 20.1 seconds with SPC Goal status: INITIAL   LONG TERM GOALS: Target date: 02/21/2024  Pt will be independent with initial HEP in order to build upon functional gains made in therapy. Baseline:  Goal status: INITIAL  2. Pt will  improve BERG to at least a 46/56 in order to demo decr fall risk.  Baseline: 38/56 Goal status: INITIAL  3.   Pt will improve TUG time to 13.5 seconds or less with SPC/no AD in order to demo decrease fall risk. Baseline:  20.1 seconds with SPC Goal status: INITIAL  4.  Pt will improve gait speed with LRAD to at least 2.8 ft/sec in order to demo improved community mobility.  Baseline: 17.8 seconds with RW = 1.84 ft/sec Goal status: INITIAL  5.  Pt will improve 5x sit<>stand to less than or equal to 14 sec to demonstrate improved functional strength and transfer efficiency.  Baseline: 17.8 seconds with no UE support Goal status: INITIAL  6.  Pt will ambulate at least 500' outdoors on unlevel surfaces with LRAD in order to demo improved community mobility.  Baseline:  Goal status: INITIAL  ASSESSMENT:  CLINICAL IMPRESSION: Today's skilled session focused on BLE strengthening and standing balance with decr UE support. With working on step ups, pt needing to use fingertip support for balance and has more difficulty leading with RLE. Pt challenged by balance on compliant surfaces, due to impaired sensation, but pt did well using appropriate ankle strategy. Will continue per POC.   OBJECTIVE IMPAIRMENTS: Abnormal gait, decreased activity tolerance, decreased balance, decreased coordination, decreased mobility, difficulty walking, decreased ROM, decreased strength, impaired flexibility, impaired sensation, postural dysfunction, and pain.   ACTIVITY LIMITATIONS: carrying, lifting, bending, stairs, transfers, and locomotion level  PARTICIPATION LIMITATIONS: shopping, community activity, occupation, and yard work  PERSONAL FACTORS: Behavior pattern, Past/current experiences, Time since onset of injury/illness/exacerbation, and 3+ comorbidities: L knee arthritis, HLD, HTN, L knee replacement 2022 are also affecting patient's functional outcome.   REHAB POTENTIAL: Good  CLINICAL DECISION  MAKING: Evolving/moderate complexity  EVALUATION COMPLEXITY: Moderate  PLAN:  PT FREQUENCY: 2x/week  PT DURATION: 8 weeks - anticipate originally 6 weeks   PLANNED INTERVENTIONS: 97164- PT Re-evaluation, 97110-Therapeutic exercises, 97530- Therapeutic activity, 97112- Neuromuscular re-education, 97535- Self Care, 02859- Manual therapy, [phone]- Gait training, [phone]- Aquatic Therapy, Patient/Family education, Balance training, Stair training, and DME instructions  PLAN FOR NEXT SESSION: Update/revise HEP as appropriate, work on dynamic gait with cane, balance tasks esp SLS and working on decr UE support, BLE strength esp LLE    Sheffield LOISE Senate, PT, DPT 01/25/2024, 8:44 AM

## 2024-01-25 NOTE — Therapy (Signed)
 OUTPATIENT OCCUPATIONAL THERAPY NEURO TREATMENT  Patient Name: Corey [name] MRN: [id number] DOB:08-25-1965, 58 y.o., male Today's Date: 01/25/2024  PCP: Joyce Norleen BROCKS, MD REFERRING PROVIDER: Pegge Toribio PARAS, PA-C  END OF SESSION:  OT End of Session - 01/25/24 0844     Visit Number 3    Number of Visits 16   up to 16 visits (anticipate less O.T. needed)   Date for OT Re-Evaluation 03/11/24    Authorization Type BC/BS - VL 50 combined PT/OT    OT Start Time 0844    OT Stop Time 0925    OT Time Calculation (min) 41 min    Activity Tolerance Patient tolerated treatment well    Behavior During Therapy WFL for tasks assessed/performed          Past Medical History:  Diagnosis Date   Arthritis    knee, left   ED (erectile dysfunction)    Hyperlipidemia    Hypertension    Sleep apnea    CPAP - not using lately due to recall    Past Surgical History:  Procedure Laterality Date   ANKLE BONE SURGERY     APPLICATION OF INTRAOPERATIVE CT SCAN  12/07/2023   Procedure: APPLICATION OF INTRAOPERATIVE CT SCAN;  Surgeon: Claudene Penne ORN, MD;  Location: [facility] ORS;  Service: Neurosurgery;;   BACK SURGERY     COLLAR BONE SURGEY     COLONOSCOPY     FRACTURE SURGERY     HERNIA REPAIR     IR IVC FILTER PLMT / S&I /IMG GUID/MOD SED  12/12/2023   KNEE ARTHROSCOPY Left 2006   LAPAROSCOPIC APPENDECTOMY N/A 01/13/2021   Procedure: APPENDECTOMY LAPAROSCOPIC WITH LYSIS OF ADHESIONS;  Surgeon: Sheldon Standing, MD;  Location: [facility];  Service: General;  Laterality: N/A;   LUMBAR MICRODISCECTOMY     POLYPECTOMY     SKIN BIOPSY Left 08/11/2021   nodulocystic fat nercrosis   TOTAL KNEE ARTHROPLASTY Left 11/11/2020   Procedure: LEFT TOTAL KNEE ARTHROPLASTY;  Surgeon: Barbarann Oneil BROCKS, MD;  Location: [facility];  Service: Orthopedics;  Laterality: Left;   UMBILICAL HERNIA REPAIR     Patient Active Problem List   Diagnosis Date Noted   Anxiety 01/12/2024   Chronic low back pain 01/12/2024   Acute  embolism and thrombosis of unspecified deep veins of unspecified lower extremity (HCC) 01/12/2024   Coping style affecting medical condition 12/15/2023   Myelopathy concurrent with and due to spinal stenosis of thoracic region [facility]) 12/06/2023   Spinal stenosis 12/06/2023   Thoracic myelopathy 12/06/2023   Morbid obesity (HCC) 12/06/2023   Depression 12/06/2023   Radiculopathy, lumbar region 11/10/2023   Flexion contracture of knee, left 03/01/2023   Prediabetes 11/19/2022   Status post laparoscopic appendectomy 01/14/2021   H/O total knee replacement, left 11/11/2020   Leiomyoma 11/08/2020   Arthritis 09/26/2018   Onychomycosis 09/26/2018   Hyperlipidemia LDL goal <100 08/31/2016   OSA on CPAP 08/31/2016   Premature ejaculation 08/19/2011    ONSET DATE: 12/17/2023 (referral date)  REFERRING DIAG: M47.14 (ICD-10-CM) - Thoracic myelopathy  THERAPY DIAG:  Muscle weakness (generalized)  Unsteadiness on feet  Other lack of coordination  Rationale for Evaluation and Treatment: Rehabilitation  SUBJECTIVE:   SUBJECTIVE STATEMENT: No pain today, just tightness. The doctor released me to lift up to 25 lbs and no back precautions at this time. I'm done with oxycodone  now.   Pt accompanied by: self  PERTINENT HISTORY: s/p emergent T3 transpedicular decompression T1-4 PSF. PMH of  back surgery, Lt TKA 2022, hernia repair, HTN, sleep apnea.  PRECAUTIONS: Other: no lifting > 25 lbs   WEIGHT BEARING RESTRICTIONS: No  PAIN:  Are you having pain? No  FALLS: Has patient fallen in last 6 months? Yes. 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  HAND DOMINANCE: Left  ADLs: Overall ADLs: some assist with LE dressing d/t back precautions and no ADL A/E except reacher Transfers/ambulation related to ADLs: Eating: independent Grooming: independent UB Dressing: independent LB Dressing: wife ties shoes, currently not wearing socks (should be wearing compression hoses) Toileting: independent Bathing: mod I  Tub Shower transfers: mod I  Equipment: Shower seat with back  IADLs: Shopping: wife doing currently Light housekeeping: wife doing currently Meal Prep: wife doing currently Community mobility: pt not released to drive yet Medication management: independent Handwriting: denies change  MOBILITY STATUS: Needs Assist: walker, cane in house  UPPER EXTREMITY ROM:  BUE AROM WNL's  UPPER EXTREMITY MMT:   grossly 5/5 BUE's  HAND FUNCTION: Grip strength: Right: 77.6 lbs; Left: 106.2 lbs  COORDINATION: 9 Hole Peg test: Right: 26 sec; Left: 28.38 sec  SENSATION: WFL  EDEMA: none in UE's  COGNITION: Overall cognitive status: Within functional limits for tasks assessed  VISION: Subjective report: no changes Baseline vision: Wears glasses all the time  PERCEPTION: Not tested  PRAXIS: Not tested  OBSERVATIONS: Pt pleasant, walks w/ walker to clinic                                                                                                                           TREATMENT:   Reviewed coordination HEP for Lt hand - pt return demo of each. Focus on in hand manipulation for increased challenge.  Reviewed putty HEP verbally as pt forgot to bring in putty - pt had no further questions.  Pt performed static and dynamic standing tasks for a total of 18 min w/o rest including:  Static standing to review golf solitaire for 11 min., and dynamic standing for additional 7  min to perform dynamic standing including: reaching for things in high cabinet, placing on floor and then retrieving from floor with one hand counter top support, side stepping along counter in prep for kitchen tasks UBE x 8 mintues, level 3 for UE strength/endurance    PATIENT EDUCATION: Education details: see above Person educated: Patient Education method: Programmer, multimedia, Demonstration, Verbal cues, and Handouts Education comprehension: verbalized understanding, returned demonstration, verbal cues required, and needs further education  HOME EXERCISE PROGRAM: 01/19/2024:  putty; coordination; golf solitaire; compression donning  GOALS: Goals reviewed with patient? Yes  SHORT TERM GOALS: Target date: 02/10/24  Independent with HEP for Rt grip strength, Lt coordination, and overall light UB strengthening HEP (no > 25 lbs)  Baseline: 01/19/2024: updated per updated spinal precautions Goal status: IN PROGRESS (met for first HEPs)  2.  Pt to verbalize understanding with A/E for LE dressing to adhere to back precautions Baseline:  Goal status: MET and discontinued as pt no longer has spinal precautions 01/19/2024  3.  Pt to stand for 10 min w/o rest in prep for IADLS Baseline:  Goal status: MET (01/25/24: Stood for 18 min)   LONG TERM GOALS: Target date: 03/11/24  Pt to perform standing tasks for 25 min w/o rest for light IADLS Baseline:  Goal status: IN PROGRESS  2.  Pt to return to cooking tasks and light cleaning tasks at mod I level Baseline:  Goal status: INITIAL  3.  Pt to increase Rt grip strength by 10 lbs Baseline: 77 lbs (Lt = 106 lbs)  Goal status: IN PROGRESS  4.  Pt to improve Lt hand coordination as evidenced by reducing speed on 9 hole peg test by 3 sec or more Baseline: 28 sec.  Goal status: IN PROGRESS   ASSESSMENT:  CLINICAL IMPRESSION: Patient demonstrates good understanding of HEP as needed to progress towards goals. Mild limitations with processing speed  today when reviewing golf solitaire. Will continue to monitor. Pt has met STG #3 today .  PERFORMANCE DEFICITS: in functional skills including ADLs, IADLs, coordination, strength, Fine motor control, mobility, balance, body mechanics, endurance, decreased knowledge of precautions, and decreased knowledge of use of DME.   IMPAIRMENTS: are limiting patient from ADLs, IADLs, work, and leisure.   CO-MORBIDITIES: may have co-morbidities  that affects occupational performance. Patient will benefit from skilled OT to address above impairments and improve overall function.  REHAB POTENTIAL: Good  PLAN:  OT FREQUENCY: 1-2x/week  OT DURATION: up to 8 weeks  PLANNED INTERVENTIONS: 97535 self care/ADL training, 02889 therapeutic exercise, 97530 therapeutic activity, 97112 neuromuscular re-education, 97140 manual therapy, 97113 aquatic therapy, 97010 moist heat, balance training, functional mobility training, patient/family education, and DME and/or AE instructions  RECOMMENDED OTHER SERVICES: none at this time  CONSULTED AND AGREED WITH PLAN OF CARE: Patient  PLAN FOR NEXT SESSION: BUE theraband HEP, dynamic standing tasks (kitchen/countertop)   Burnard JINNY Roads, OT 01/25/2024, 9:27 AM

## 2024-01-27 ENCOUNTER — Encounter: Payer: Self-pay | Admitting: Physical Therapy

## 2024-01-27 ENCOUNTER — Ambulatory Visit: Admitting: Occupational Therapy

## 2024-01-27 ENCOUNTER — Ambulatory Visit: Admitting: Physical Therapy

## 2024-01-27 ENCOUNTER — Encounter: Payer: Self-pay | Admitting: Occupational Therapy

## 2024-01-27 DIAGNOSIS — R2689 Other abnormalities of gait and mobility: Secondary | ICD-10-CM | POA: Diagnosis not present

## 2024-01-27 DIAGNOSIS — R2681 Unsteadiness on feet: Secondary | ICD-10-CM

## 2024-01-27 DIAGNOSIS — M6281 Muscle weakness (generalized): Secondary | ICD-10-CM

## 2024-01-27 DIAGNOSIS — R278 Other lack of coordination: Secondary | ICD-10-CM | POA: Diagnosis not present

## 2024-01-27 DIAGNOSIS — M546 Pain in thoracic spine: Secondary | ICD-10-CM | POA: Diagnosis not present

## 2024-01-27 NOTE — Therapy (Signed)
 OUTPATIENT OCCUPATIONAL THERAPY NEURO TREATMENT  Patient Name: Corey [name] MRN: [id number] DOB:04/12/1966, 58 y.o., male Today's Date: 01/27/2024  PCP: Joyce Norleen BROCKS, MD REFERRING PROVIDER: Pegge Toribio PARAS, PA-C  END OF SESSION:  OT End of Session - 01/27/24 9148     Visit Number 4    Number of Visits 16   up to 16 visits (anticipate less O.T. needed)   Date for OT Re-Evaluation 03/11/24    Authorization Type BC/BS - VL 50 combined PT/OT    OT Start Time 0850    OT Stop Time 0930    OT Time Calculation (min) 40 min    Activity Tolerance Patient tolerated treatment well    Behavior During Therapy WFL for tasks assessed/performed          Past Medical History:  Diagnosis Date   Arthritis    knee, left   ED (erectile dysfunction)    Hyperlipidemia    Hypertension    Sleep apnea    CPAP - not using lately due to recall    Past Surgical History:  Procedure Laterality Date   ANKLE BONE SURGERY     APPLICATION OF INTRAOPERATIVE CT SCAN  12/07/2023   Procedure: APPLICATION OF INTRAOPERATIVE CT SCAN;  Surgeon: Claudene Penne ORN, MD;  Location: [facility] OR[facility];  Service: Neurosurgery;;   BACK SURGERY     COLLAR BONE SURGEY     COLONOSCOPY     FRACTURE SURGERY     HERNIA REPAIR     IR IVC FILTER PLMT / S&I /IMG GUID/MOD SED  12/12/2023   KNEE ARTHROSCOPY Left 2006   LAPAROSCOPIC APPENDECTOMY N/A 01/13/2021   Procedure: APPENDECTOMY LAPAROSCOPIC WITH LYSIS OF ADHESIONS;  Surgeon: Sheldon Standing, MD;  Location: [facility];  Service: General;  Laterality: N/A;   LUMBAR MICRODISCECTOMY     POLYPECTOMY     SKIN BIOPSY Left 08/11/2021   nodulocystic fat nercrosis   TOTAL KNEE ARTHROPLASTY Left 11/11/2020   Procedure: LEFT TOTAL KNEE ARTHROPLASTY;  Surgeon: Barbarann Oneil BROCKS, MD;  Location: [facility];  Service: Orthopedics;  Laterality: Left;   UMBILICAL HERNIA REPAIR     Patient Active Problem List   Diagnosis Date Noted   Anxiety 01/12/2024   Chronic low back pain 01/12/2024   Acute  embolism and thrombosis of unspecified deep veins of unspecified lower extremity (HCC) 01/12/2024   Coping style affecting medical condition 12/15/2023   Myelopathy concurrent with and due to spinal stenosis of thoracic region [facility]) 12/06/2023   Spinal stenosis 12/06/2023   Thoracic myelopathy 12/06/2023   Morbid obesity (HCC) 12/06/2023   Depression 12/06/2023   Radiculopathy, lumbar region 11/10/2023   Flexion contracture of knee, left 03/01/2023   Prediabetes 11/19/2022   Status post laparoscopic appendectomy 01/14/2021   H/O total knee replacement, left 11/11/2020   Leiomyoma 11/08/2020   Arthritis 09/26/2018   Onychomycosis 09/26/2018   Hyperlipidemia LDL goal <100 08/31/2016   OSA on CPAP 08/31/2016   Premature ejaculation 08/19/2011    ONSET DATE: 12/17/2023 (referral date)  REFERRING DIAG: M47.14 (ICD-10-CM) - Thoracic myelopathy  THERAPY DIAG:  Muscle weakness (generalized)  Unsteadiness on feet  Rationale for Evaluation and Treatment: Rehabilitation  SUBJECTIVE:   SUBJECTIVE STATEMENT: Pain 3/10 in back today. I didn't sleep well last night but it may just be because I'm not taking the oxy anymore. I wasn't in pain though  Pt accompanied by: self  PERTINENT HISTORY: s/p emergent T3 transpedicular decompression T1-4 PSF. PMH of back surgery, Lt TKA 2022,  hernia repair, HTN, sleep apnea.  PRECAUTIONS: Other: no lifting > 25 lbs   WEIGHT BEARING RESTRICTIONS: No  PAIN:  Are you having pain? No  FALLS: Has patient fallen in last 6 months? Yes. 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  HAND DOMINANCE: Left  ADLs: Overall ADLs: some assist with LE dressing d/t back precautions and no ADL A/E except reacher Transfers/ambulation related to ADLs: Eating: independent Grooming: independent UB Dressing: independent LB Dressing: wife ties shoes, currently not wearing socks (should be wearing compression hoses) Toileting: independent Bathing: mod I  Tub Shower transfers: mod I  Equipment: Shower seat with back  IADLs: Shopping: wife doing currently Light housekeeping: wife doing currently Meal Prep: wife doing currently Community mobility: pt not released to drive yet Medication management: independent Handwriting: denies change  MOBILITY STATUS: Needs Assist: walker, cane in house  UPPER EXTREMITY ROM:  BUE AROM WNL's  UPPER EXTREMITY MMT:   grossly 5/5 BUE's  HAND FUNCTION: Grip strength: Right: 77.6 lbs; Left: 106.2 lbs  COORDINATION: 9 Hole Peg test: Right: 26 sec; Left: 28.38 sec  SENSATION: WFL  EDEMA: none in UE's  COGNITION: Overall cognitive status: Within functional limits for tasks assessed  VISION: Subjective report: no changes Baseline vision: Wears glasses all the time  PERCEPTION: Not tested  PRAXIS: Not tested  OBSERVATIONS: Pt pleasant, walks w/ walker to clinic                                                                                                                           TREATMENT:   Pt issued theraband HEP for UB conditioning - see pt instructions for details. Pt initially issued green resistance band, but too easy, therefore issued black resistance band. Min cues provided Pt performing dynamic standing and mobility for kitchen tasks w/ cues for safety/fall prevention and walker negotiation including: getting things out of high and low cabinets, getting things out of refrigerator, etc.   PATIENT  EDUCATION: Education details: see above Person educated: Patient Education method: Explanation, Demonstration, Verbal cues, and Handouts Education comprehension: verbalized understanding, returned demonstration, and verbal cues required  HOME EXERCISE PROGRAM: 01/19/2024: putty; coordination; golf solitaire; compression donning 01/27/24: theraband HEP   GOALS: Goals reviewed with patient? Yes  SHORT TERM GOALS: Target date: 02/10/24  Independent with HEP for Rt grip strength, Lt coordination, and overall light UB strengthening HEP (no > 25 lbs)  Baseline: 01/19/2024: updated per updated spinal precautions Goal status: MET  2.  Pt to verbalize understanding  with A/E for LE dressing to adhere to back precautions Baseline:  Goal status: MET and discontinued as pt no longer has spinal precautions 01/19/2024  3.  Pt to stand for 10 min w/o rest in prep for IADLS Baseline:  Goal status: MET (01/25/24: Stood for 18 min)   LONG TERM GOALS: Target date: 03/11/24  Pt to perform standing tasks for 25 min w/o rest for light IADLS Baseline:  Goal status: IN PROGRESS  2.  Pt to return to cooking tasks and light cleaning tasks at mod I level Baseline:  Goal status: INITIAL  3.  Pt to increase Rt grip strength by 10 lbs Baseline: 77 lbs (Lt = 106 lbs)  Goal status: IN PROGRESS  4.  Pt to improve Lt hand coordination as evidenced by reducing speed on 9 hole peg test by 3 sec or more Baseline: 28 sec.  Goal status: IN PROGRESS   ASSESSMENT:  CLINICAL IMPRESSION: Patient demonstrates good understanding of HEP as needed to progress towards goals.  Pt has met STG #1 today .  PERFORMANCE DEFICITS: in functional skills including ADLs, IADLs, coordination, strength, Fine motor control, mobility, balance, body mechanics, endurance, decreased knowledge of precautions, and decreased knowledge of use of DME.   IMPAIRMENTS: are limiting patient from ADLs, IADLs, work, and leisure.    CO-MORBIDITIES: may have co-morbidities  that affects occupational performance. Patient will benefit from skilled OT to address above impairments and improve overall function.  REHAB POTENTIAL: Good  PLAN:  OT FREQUENCY: 1-2x/week  OT DURATION: up to 8 weeks  PLANNED INTERVENTIONS: 97535 self care/ADL training, 02889 therapeutic exercise, 97530 therapeutic activity, 97112 neuromuscular re-education, 97140 manual therapy, 97113 aquatic therapy, 97010 moist heat, balance training, functional mobility training, patient/family education, and DME and/or AE instructions  RECOMMENDED OTHER SERVICES: none at this time  CONSULTED AND AGREED WITH PLAN OF CARE: Patient  PLAN FOR NEXT SESSION: ? Sleep hygiene handout, progress towards LTG's   Burnard JINNY Roads, OT 01/27/2024, 12:30 PM

## 2024-01-27 NOTE — Patient Instructions (Signed)
  Strengthening: Resisted Flexion   Hold tubing with __Rt, then Lt___ arm(s) at side. Pull forward and up. Move shoulder through pain-free range of motion. Repeat __10__ times per set.  Do _1-2_ sessions per day , every other day   Strengthening: Resisted Extension   Hold tubing in __BOTH___ hand(s), arm forward. Pull arm back, elbow straight. Repeat _10___ times per set. Do _1-2___ sessions per day, every other day.   Resisted Horizontal Abduction: Bilateral   Sit or stand, tubing in both hands, arms out in front. Keeping arms straight, pinch shoulder blades together and stretch arms out. Repeat _10___ times per set. Do _1-2___ sessions per day, every other day.   Elbow Flexion: Resisted   With tubing held in __BOTH____ hand(s) and other end secured under foot, curl arm up as far as possible. Repeat _10___ times per set. Do _1-2___ sessions per day, every other day.    Elbow Extension: Resisted   Sit in chair with resistive band anchored at opposite shoulder and __Rt, then Lt_____ elbow bent. Straighten elbow. Repeat _10___ times per set.  Do _1-2___ sessions per day, every other day.

## 2024-01-27 NOTE — Therapy (Signed)
 OUTPATIENT PHYSICAL THERAPY NEURO TREATMENT   Patient Name: Corey [name] MRN: [id number] DOB:1966/03/08, 58 y.o., male Today's Date: 01/27/2024   PCP: Joyce Norleen BROCKS, MD   REFERRING PROVIDER: Pegge Toribio PARAS, PA-C  END OF SESSION:  PT End of Session - 01/27/24 0805     Visit Number 4    Number of Visits 13    Date for PT Re-Evaluation 03/10/24    Authorization Type BLUE CROSS BLUE [employer]IELD    PT Start Time 0803    PT Stop Time 0843    PT Time Calculation (min) 40 min    Equipment Utilized During Treatment Gait belt    Activity Tolerance Patient tolerated treatment well    Behavior During Therapy WFL for tasks assessed/performed          Past Medical History:  Diagnosis Date   Arthritis    knee, left   ED (erectile dysfunction)    Hyperlipidemia    Hypertension    Sleep apnea    CPAP - not using lately due to recall    Past Surgical History:  Procedure Laterality Date   ANKLE BONE SURGERY     APPLICATION OF INTRAOPERATIVE CT SCAN  12/07/2023   Procedure: APPLICATION OF INTRAOPERATIVE CT SCAN;  Surgeon: Claudene Penne ORN, MD;  Location: [facility] ORS;  Service: Neurosurgery;;   BACK SURGERY     COLLAR BONE SURGEY     COLONOSCOPY     FRACTURE SURGERY     HERNIA REPAIR     IR IVC FILTER PLMT / S&I /IMG GUID/MOD SED  12/12/2023   KNEE ARTHROSCOPY Left 2006   LAPAROSCOPIC APPENDECTOMY N/A 01/13/2021   Procedure: APPENDECTOMY LAPAROSCOPIC WITH LYSIS OF ADHESIONS;  Surgeon: Sheldon Standing, MD;  Location: [facility];  Service: General;  Laterality: N/A;   LUMBAR MICRODISCECTOMY     POLYPECTOMY     SKIN BIOPSY Left 08/11/2021   nodulocystic fat nercrosis   TOTAL KNEE ARTHROPLASTY Left 11/11/2020   Procedure: LEFT TOTAL KNEE ARTHROPLASTY;  Surgeon: Barbarann Oneil BROCKS, MD;  Location: [facility];  Service: Orthopedics;  Laterality: Left;   UMBILICAL HERNIA REPAIR     Patient Active Problem List   Diagnosis Date Noted   Anxiety 01/12/2024   Chronic low back pain 01/12/2024   Acute  embolism and thrombosis of unspecified deep veins of unspecified lower extremity (HCC) 01/12/2024   Coping style affecting medical condition 12/15/2023   Myelopathy concurrent with and due to spinal stenosis of thoracic region [facility]) 12/06/2023   Spinal stenosis 12/06/2023   Thoracic myelopathy 12/06/2023   Morbid obesity (HCC) 12/06/2023   Depression 12/06/2023   Radiculopathy, lumbar region 11/10/2023   Flexion contracture of knee, left 03/01/2023   Prediabetes 11/19/2022   Status post laparoscopic appendectomy 01/14/2021   H/O total knee replacement, left 11/11/2020   Leiomyoma 11/08/2020   Arthritis 09/26/2018   Onychomycosis 09/26/2018   Hyperlipidemia LDL goal <100 08/31/2016   OSA on CPAP 08/31/2016   Premature ejaculation 08/19/2011    ONSET DATE: 12/16/2023  REFERRING DIAG: M47.14 (ICD-10-CM) - Thoracic myelopathy  THERAPY DIAG:  Muscle weakness (generalized)  Unsteadiness on feet  Other abnormalities of gait and mobility  Rationale for Evaluation and Treatment: Rehabilitation  SUBJECTIVE:  SUBJECTIVE STATEMENT: Went to the gym yesterday - did the bike and some leg machines. Hasn't been able to sleep the past few nights due to incr pain   Pt accompanied by: self   PERTINENT HISTORY: Status post T3 transpedicular decompression, T1-T4 PSF 12/07/2023 due to severe thoracic myelopathy with progressive ambulatory functional loss per Dr. Penne Sharps neurosurgery.NO BRACE REQUIRED. Continues to have some pain in his mid back up to his neck. He does feel as though the weakness in his legs is improving however he continues to use a rollator. No new weakness, numbness or tingling.Of note his operative course was complicated after shown to have a DVT postoperatively on day 3. He continues to take  Eliquis  for this   Got inpatient rehab and was discharged home 12/20/23  PMH: L knee arthritis, HLD, HTN, L knee replacement 2022  PAIN:  Are you having pain? No - just back tightness (mild)  There were no vitals filed for this visit.    PRECAUTIONS: Back  Precaution/Restrictions Comments: spinal precautions, no brace needed per orders - spinal precautions lifted at 8/13 visit - can lift up to 25 lbs, can bend and twist to tolerance  FALLS: Has patient fallen in last 6 months? Yes. Number of falls 20, did not injure himself, legs were weak and didn't have strength in his legs  LIVING ENVIRONMENT: Lives with: lives with their spouse Lives in: House/apartment Stairs: Yes: External: 2 steps; none Has following equipment at home: Single point cane, Walker - 2 wheeled, Environmental consultant - 4 wheeled, shower chair, and raised grab bars  PLOF: Independent, Vocation/Vocational requirements: Estate agent , and Leisure: Working out, taking walks   PATIENT GOALS: Wants to get back to being independent, getting his strength back and walking on his own   OBJECTIVE:  Note: Objective measures were completed at Evaluation unless otherwise noted.  COGNITION: Overall cognitive status: Within functional limits for tasks assessed   SENSATION: Light touch: WFL Proprioception: WFL and with ankle DF/PF, noted some bouts of clonus bilat when testing into ankle DF   Pt reporting numbness/tingling in L knee and both feet have numbness/tingling and coldness  Sometimes if he rubs up against something will feel warmth   COORDINATION: Heel to shin: slightly more difficult with LLE    POSTURE: No Significant postural limitations  LOWER EXTREMITY ROM:    Pt with limited L knee extension/flexion ROM due to hx of L knee replacement   LOWER EXTREMITY MMT:    MMT Right Eval Left Eval  Hip flexion 4+ 4-  Hip extension    Hip abduction 4+ 4+  Hip adduction 5 5  Hip internal rotation    Hip external  rotation    Knee flexion 5 4+  Knee extension 4+ 4-  Ankle dorsiflexion 5 5  Ankle plantarflexion    Ankle inversion    Ankle eversion    (Blank rows = not tested)  All tested in sitting   BED MOBILITY:  Pt reporting no difficulties, performing the log roll   TRANSFERS: Sit to stand: SBA  Assistive device utilized: None     Stand to sit: SBA  Assistive device utilized: None      Able to perform with no UE support, wide BOS with LLE staggered behind R  Pt was very pleased with this, did not realize he could perform without his hands   GAIT: Findings: Gait Characteristics: step through pattern, decreased stride length, and narrow BOS, Distance walked: Clinic distances, Assistive device  utilized:Single point cane and Walker - 2 wheeled, Level of assistance: Modified independence, SBA, and CGA, and Comments: mod I with RW, SBA/CGA with SPC   FUNCTIONAL TESTS:  5 times sit to stand: 17.8 seconds with no UE support  Timed up and go (TUG): 16.5 seconds with RW, 20.1 seconds with SPC  10 meter walk test: 17.8 seconds with RW = 1.84 ft/sec                                                                                                                                   TREATMENT DATE: 01/27/24  Therapeutic Exercise:  Leg Press for strengthening:  BLE: 60# 5 reps - pt reporting this felt too easy, so adjusted weight until getting to 100# which pt felt was a good challenge, performed 2 x 10 reps  With single leg: LLE: 10 reps at 60# RLE: 10 reps at 60#   Sit <> stands 5 reps holding 6# medicine ball and tossing to floor and catching it, performed an additional 10 reps on air ex with pt reporting RPE as 5/10, progressed difficulty to another 10 reps on air ex with 8# medicine ball   NMR:  With 4 blaze pods (2 on first step, 2 on 2nd step) to work on weight shifting, SLS, foot clearance, reaction times. Performed on air ex for compliant surface, trying to perform with no UE support,  CGA for balance  Performed 3 bouts of 1 minute each: 24 hits, 31 hits, 31 hits Pt reporting RPE as 7/10  On rockerboard in A/P direction: Weight shifting working on hip/ankle strategy 20 reps Alternating posterior stepping and then back onto board 2 x 10 reps, 2nd rep trying to hold SLS for a few more seconds  On rockerboard in M/L direction: Weight shifting 20 reps, pt did well maintaining his balance with this using appropriate balance strategies      PATIENT EDUCATION: Education details: Continue HEP Person educated: Patient Education method: Explanation Education comprehension: verbalized understanding  HOME EXERCISE PROGRAM:  Access Code: [email or web address] URL: [email or web address] Date: 12/18/2023 Prepared by: Schuyler Batter   Exercises - Supine Hamstring Stretch with Strap  - 1 x daily - 7 x weekly - 3 sets - 10 reps  - Supine Heel Slide with Strap  - 1 x daily - 7 x weekly - 3 sets - 10 reps - Supine 90/90 Alternating Toe Touch  - 1 x daily - 7 x weekly - 3 sets - 10 reps - Dead Bug  - 1 x daily - 7 x weekly - 3 sets - 10 reps - Mini Squats with Walker and Chair  - 1 x daily - 7 x weekly - 4 sets - 10 reps - Side Stepping with Resistance at Ankles and Counter Support  - 1 x daily - 7 x weekly - 3 sets - 10 reps - Forward and Backward Monster Walk with Counter Support  -  1 x daily - 7 x weekly - 3 sets - 10 reps  GOALS: Goals reviewed with patient? Yes  SHORT TERM GOALS: Target date: 01/31/2024  Pt will be independent with initial HEP in order to build upon functional gains made in therapy. Baseline: Goal status: INITIAL  2.  Pt will improve gait speed with LRAD to at least 2.2 ft/sec in order to demo improved community mobility.   Baseline: 17.8 seconds with RW = 1.84 ft/sec Goal status: INITIAL  3.  Pt will improve TUG time to 17 seconds or less with SPC in order to demo decrease fall risk.  Baseline: 20.1 seconds with SPC Goal status:  INITIAL   LONG TERM GOALS: Target date: 02/21/2024  Pt will be independent with initial HEP in order to build upon functional gains made in therapy. Baseline:  Goal status: INITIAL  2. Pt will improve BERG to at least a 46/56 in order to demo decr fall risk.  Baseline: 38/56 Goal status: INITIAL  3.   Pt will improve TUG time to 13.5 seconds or less with SPC/no AD in order to demo decrease fall risk. Baseline:  20.1 seconds with SPC Goal status: INITIAL  4.  Pt will improve gait speed with LRAD to at least 2.8 ft/sec in order to demo improved community mobility.  Baseline: 17.8 seconds with RW = 1.84 ft/sec Goal status: INITIAL  5.  Pt will improve 5x sit<>stand to less than or equal to 14 sec to demonstrate improved functional strength and transfer efficiency.  Baseline: 17.8 seconds with no UE support Goal status: INITIAL  6.  Pt will ambulate at least 500' outdoors on unlevel surfaces with LRAD in order to demo improved community mobility.  Baseline:  Goal status: INITIAL   ASSESSMENT:  CLINICAL IMPRESSION: Today's skilled session focused on BLE strengthening and standing balance with decr UE support. Pt demonstrating improvements in strength and balance since earlier session this week. Previously pt needing to use UE support to stand with sit <> stands on a compliant surface, and today pt able to perform 20 reps without. Pt also able to demo improved SLS stability on compliant surfaces, decr UE support. Pt is progressing well, will continue per POC.   OBJECTIVE IMPAIRMENTS: Abnormal gait, decreased activity tolerance, decreased balance, decreased coordination, decreased mobility, difficulty walking, decreased ROM, decreased strength, impaired flexibility, impaired sensation, postural dysfunction, and pain.   ACTIVITY LIMITATIONS: carrying, lifting, bending, stairs, transfers, and locomotion level  PARTICIPATION LIMITATIONS: shopping, community activity, occupation, and yard  work  PERSONAL FACTORS: Behavior pattern, Past/current experiences, Time since onset of injury/illness/exacerbation, and 3+ comorbidities: L knee arthritis, HLD, HTN, L knee replacement 2022 are also affecting patient's functional outcome.   REHAB POTENTIAL: Good  CLINICAL DECISION MAKING: Evolving/moderate complexity  EVALUATION COMPLEXITY: Moderate  PLAN:  PT FREQUENCY: 2x/week  PT DURATION: 8 weeks - anticipate originally 6 weeks   PLANNED INTERVENTIONS: 97164- PT Re-evaluation, 97110-Therapeutic exercises, 97530- Therapeutic activity, [id number]- Neuromuscular re-education, 97535- Self Care, 02859- Manual therapy, [phone]- Gait training, [phone]- Aquatic Therapy, Patient/Family education, Balance training, Stair training, and DME instructions  PLAN FOR NEXT SESSION: CHECK STGS THIS WEEK!   Update/revise HEP as appropriate, work on dynamic gait with cane, balance tasks esp SLS and working on decr UE support, BLE strength esp LLE    [place] LOISE Senate, PT, DPT 01/27/2024, 8:46 AM

## 2024-01-28 ENCOUNTER — Ambulatory Visit: Payer: Self-pay | Admitting: Neurosurgery

## 2024-02-02 ENCOUNTER — Encounter: Payer: Self-pay | Admitting: Physical Therapy

## 2024-02-02 ENCOUNTER — Ambulatory Visit: Admitting: Occupational Therapy

## 2024-02-02 ENCOUNTER — Ambulatory Visit: Attending: Physician Assistant | Admitting: Physical Therapy

## 2024-02-02 VITALS — BP 145/92 | HR 96

## 2024-02-02 DIAGNOSIS — R2689 Other abnormalities of gait and mobility: Secondary | ICD-10-CM | POA: Insufficient documentation

## 2024-02-02 DIAGNOSIS — M6281 Muscle weakness (generalized): Secondary | ICD-10-CM | POA: Insufficient documentation

## 2024-02-02 DIAGNOSIS — R278 Other lack of coordination: Secondary | ICD-10-CM | POA: Insufficient documentation

## 2024-02-02 DIAGNOSIS — M4714 Other spondylosis with myelopathy, thoracic region: Secondary | ICD-10-CM | POA: Diagnosis not present

## 2024-02-02 DIAGNOSIS — R2681 Unsteadiness on feet: Secondary | ICD-10-CM | POA: Insufficient documentation

## 2024-02-02 DIAGNOSIS — M546 Pain in thoracic spine: Secondary | ICD-10-CM | POA: Insufficient documentation

## 2024-02-02 NOTE — Patient Instructions (Signed)
 FOR AQUATIC USE: Access Code: [email or web address] URL: [email or web address] Date: 02/02/2024 Prepared by: Daved Bull  Exercises - Push-Up on Pool Wall  - 1 x daily - 7 x weekly - 3 sets - 10 reps - Forward and Backward Stepping at [employer]  - 1 x daily - 7 x weekly - 3 sets - 10 reps - Asymmetrical Shoulder Flexion Extension with Hand Floats  - 1 x daily - 7 x weekly - 3 sets - 10 reps - Heel Toe Raises at Pool Wall  - 1 x daily - 7 x weekly - 3 sets - 10 reps - Lateral Stepping at Pool Wall  - 1 x daily - 7 x weekly - 3 sets - 10 reps - Standing Hip Circles at [employer]  - 1 x daily - 7 x weekly - 3 sets - 10 reps - Lunge to Target at [employer]  - 1 x daily - 7 x weekly - 3 sets - 10 reps - Plank with Hip Extension at [employer]  - 1 x daily - 7 x weekly - 3 sets - 10 reps - Standing 3-Way Kick with Ankle Float at [employer]  - 1 x daily - 7 x weekly - 3 sets - 10 reps - Sitting Balance on Pool Noodle  - 1 x daily - 7 x weekly - 3 sets - 10 reps

## 2024-02-02 NOTE — Therapy (Signed)
 OUTPATIENT OCCUPATIONAL THERAPY NEURO TREATMENT  Patient Name: Corey [name] MRN: [id number] DOB:[birth year], 58 y.o., male Today's Date: 02/02/2024  PCP: Joyce Norleen BROCKS, MD REFERRING PROVIDER: Pegge Toribio PARAS, PA-C  END OF SESSION:  OT End of Session - 02/02/24 0957     Visit Number 5    Number of Visits 16   up to 16 visits (anticipate less O.T. needed)   Date for OT Re-Evaluation 03/11/24    Authorization Type BC/BS - VL 50 combined PT/OT    OT Start Time 0934    OT Stop Time 1017    OT Time Calculation (min) 43 min    Activity Tolerance Patient tolerated treatment well    Behavior During Therapy WFL for tasks assessed/performed         Past Medical History:  Diagnosis Date   Arthritis    knee, left   ED (erectile dysfunction)    Hyperlipidemia    Hypertension    Sleep apnea    CPAP - not using lately due to recall    Past Surgical History:  Procedure Laterality Date   ANKLE BONE SURGERY     APPLICATION OF INTRAOPERATIVE CT SCAN  12/07/2023   Procedure: APPLICATION OF INTRAOPERATIVE CT SCAN;  Surgeon: Claudene Penne ORN, MD;  Location: [facility];  Service: Neurosurgery;;   BACK SURGERY     COLLAR BONE SURGEY     COLONOSCOPY     FRACTURE SURGERY     HERNIA REPAIR     IR IVC FILTER PLMT / S&I /IMG GUID/MOD SED  12/12/2023   KNEE ARTHROSCOPY Left 2006   LAPAROSCOPIC APPENDECTOMY N/A 01/13/2021   Procedure: APPENDECTOMY LAPAROSCOPIC WITH LYSIS OF ADHESIONS;  Surgeon: Sheldon Standing, MD;  Location: [facility];  Service: General;  Laterality: N/A;   LUMBAR MICRODISCECTOMY     POLYPECTOMY     SKIN BIOPSY Left 08/11/2021   nodulocystic fat nercrosis   TOTAL KNEE ARTHROPLASTY Left 11/11/2020   Procedure: LEFT TOTAL KNEE ARTHROPLASTY;  Surgeon: Barbarann Oneil BROCKS, MD;  Location: [facility] OR;  Service: Orthopedics;  Laterality: Left;   UMBILICAL HERNIA REPAIR     Patient Active Problem List   Diagnosis Date Noted   Anxiety 01/12/2024   Chronic low back pain 01/12/2024   Acute  embolism and thrombosis of unspecified deep veins of unspecified lower extremity (HCC) 01/12/2024   Coping style affecting medical condition 12/15/2023   Myelopathy concurrent with and due to spinal stenosis of thoracic region [facility]) 12/06/2023   Spinal stenosis 12/06/2023   Thoracic myelopathy 12/06/2023   Morbid obesity (HCC) 12/06/2023   Depression 12/06/2023   Radiculopathy, lumbar region 11/10/2023   Flexion contracture of knee, left 03/01/2023   Prediabetes 11/19/2022   Status post laparoscopic appendectomy 01/14/2021   H/O total knee replacement, left 11/11/2020   Leiomyoma 11/08/2020   Arthritis 09/26/2018   Onychomycosis 09/26/2018   Hyperlipidemia LDL goal <100 08/31/2016   OSA on CPAP 08/31/2016   Premature ejaculation 08/19/2011   ONSET DATE: 12/17/2023 (referral date)  REFERRING DIAG: M47.14 (ICD-10-CM) - Thoracic myelopathy  THERAPY DIAG:  Muscle weakness (generalized)  Other lack of coordination  Pain in thoracic spine  Rationale for Evaluation and Treatment: Rehabilitation  SUBJECTIVE:   SUBJECTIVE STATEMENT: He was able to get better sleep last night sleeping in his bed. Denies hypersensitivity to heat in R hand but states it is still present in his feet.   Pt accompanied by: self  PERTINENT HISTORY: s/p emergent T3 transpedicular decompression T1-4 PSF. PMH  of back surgery, Lt TKA 2022, hernia repair, HTN, sleep apnea.  PRECAUTIONS: Other: no lifting > 25 lbs   WEIGHT BEARING RESTRICTIONS: No  PAIN:  Are you having pain? No  FALLS: Has patient fallen in last 6 months? Yes. 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  HAND DOMINANCE: Left  ADLs: Overall ADLs: some assist with LE dressing d/t back precautions and no ADL A/E except reacher Transfers/ambulation related to ADLs: Eating: independent Grooming: independent UB Dressing: independent LB Dressing: wife ties shoes, currently not wearing socks (should be wearing compression hoses) Toileting: independent Bathing: mod I  Tub Shower transfers: mod I  Equipment: Shower seat with back  IADLs: Shopping: wife doing currently Light housekeeping: wife doing currently Meal Prep: wife doing currently Community mobility: pt not released to drive yet Medication management: independent Handwriting: denies change  MOBILITY STATUS: Needs Assist: walker, cane in house  UPPER EXTREMITY ROM:  BUE AROM WNL's  UPPER EXTREMITY MMT:   grossly 5/5 BUE's  HAND FUNCTION: Grip strength: Right: 77.6 lbs; Left: 106.2 lbs  COORDINATION: 9 Hole Peg test: Right: 26 sec; Left: 28.38 sec  SENSATION: WFL  EDEMA: none in UE's  COGNITION: Overall cognitive status: Within functional limits for tasks assessed  VISION: Subjective report: no changes Baseline vision: Wears glasses all the time  PERCEPTION: Not tested  PRAXIS: Not tested  OBSERVATIONS: Pt pleasant, walks w/ walker to clinic                                                                                                                           TREATMENT:   Objective measures assessed as noted in Goals section to determine progression towards goals.  OT educated pt on sleep hygiene and why sleep is important to healing. Suggested ways to get a better night's sleep in his family situation  Issued and reviewed safety considerations for loss of sensation B feet - see pt instructions for details. Pt wrote precautions out for better  recall.  PATIENT EDUCATION: Education details: see above Person educated: Patient Education method: Programmer, multimedia, Demonstration, Verbal cues, and Handouts Education comprehension: verbalized understanding, returned demonstration, and verbal cues required  HOME EXERCISE PROGRAM: 01/19/2024: putty; coordination; golf solitaire; compression donning 01/27/24: theraband HEP  02/02/2024: sensory precautions; sleep hygiene  GOALS: Goals reviewed with patient? Yes  SHORT TERM GOALS:  MET 3/3 goals   LONG TERM GOALS: Target date: 03/11/24  Pt to perform standing tasks for 25 min w/o rest for light IADLS Baseline:  Goal  status: IN PROGRESS  2.  Pt to return to cooking tasks and light cleaning tasks at mod I level Baseline:  02/02/2024: uses air fryer to make chicken; dishes and laundry; still not taking out trash or cleaning shower Goal status: INITIAL  3.  Pt to increase Rt grip strength by 10 lbs Baseline: 77 lbs (Lt = 106 lbs)  02/02/2024: no improvement Goal status: IN PROGRESS  4.  Pt to improve Lt hand coordination as evidenced by reducing speed on 9 hole peg test by 3 sec or more Baseline: 28 sec.  02/02/2024: 26 seconds; 21 seconds Goal status: MET  ASSESSMENT:  CLINICAL IMPRESSION: Patient demonstrates good understanding of strategies as needed to progress towards goals.  Pt has met LTG #4 today. Recommend focus on RUE strengthening and endurance.  SABRA  PERFORMANCE DEFICITS: in functional skills including ADLs, IADLs, coordination, strength, Fine motor control, mobility, balance, body mechanics, endurance, decreased knowledge of precautions, and decreased knowledge of use of DME.   IMPAIRMENTS: are limiting patient from ADLs, IADLs, work, and leisure.   CO-MORBIDITIES: may have co-morbidities  that affects occupational performance. Patient will benefit from skilled OT to address above impairments and improve overall function.  REHAB POTENTIAL: Good  PLAN:  OT FREQUENCY:  1-2x/week  OT DURATION: up to 8 weeks  PLANNED INTERVENTIONS: 97535 self care/ADL training, 02889 therapeutic exercise, 97530 therapeutic activity, 97112 neuromuscular re-education, 97140 manual therapy, 97113 aquatic therapy, 97010 moist heat, balance training, functional mobility training, patient/family education, and DME and/or AE instructions  RECOMMENDED OTHER SERVICES: none at this time  CONSULTED AND AGREED WITH PLAN OF CARE: Patient  PLAN FOR NEXT SESSION: Flex bar, UBE, progress towards LTGs   Jocelyn CHRISTELLA Bottom, OT 02/02/2024, 4:45 PM

## 2024-02-02 NOTE — Patient Instructions (Signed)
??   Sleep Hygiene Handout ?? What Is Sleep Hygiene? Sleep hygiene refers to healthy habits and practices that help improve the quality, duration, and consistency of your sleep. Good sleep hygiene supports mental, emotional, and physical health.  ? Tips for Better Sleep Hygiene 1. Stick to a Consistent Sleep Schedule Go to bed and wake up at the same time every day--even on weekends. Helps regulate your body's internal clock. 2. Create a Relaxing Bedtime Routine Try calming activities before bed (e.g., reading, gentle stretching, meditation). Avoid stressful conversations or work tasks right before sleep. Take a bath or shower. 3. Limit Exposure to Light at Night Dim the lights in the evening. Avoid screens (phones, TVs, computers) at least 1 hour before bed. Use blue light filters if necessary. 4. Make Your Sleep Environment Comfortable Keep your bedroom dark, quiet, and cool  Use blackout curtains, white noise machines, or earplugs if needed. Invest in a comfortable mattress and pillows. Change your bedsheets and make your bed Practice proper sleep positioning Incorporate smells that you enjoy/help you relax like lavender, peppermint, etc. (lotion, bath/beauty products, essential oils/diffuser) 5. Watch What You Eat & Drink Avoid large meals, caffeine, and alcohol close to bedtime. Try to finish eating and drinking fluids at least 2-3 hours before bed.   6. Get Regular Physical Activity Exercise during the day can help you fall asleep faster and enjoy deeper sleep. (complete therapy exercises, walking, go to the gym) Avoid intense workouts late in the evening. 7. Limit Naps Keep naps short (20-30 minutes). Avoid napping late in the afternoon or evening.  ?? Habits That Interfere with Sleep Using your bed for work, watching TV, or eating. Staying in bed when you can't fall asleep (if you're awake for 20+ minutes, get up and do something relaxing in dim light).  ??? Bonus Tips  for Relaxation Try breathing exercises, progressive muscle relaxation, or guided meditation. Apps like Calm, Headspace, or Insight Timer can be helpful.  ?? When to Seek Help Consider talking to a doctor or sleep specialist if: You regularly have trouble falling or staying asleep. You snore loudly or gasp for air during sleep. You feel very sleepy during the day despite adequate sleep.  After a brain injury, sleep becomes especially important for brain recovery and overall health. Here's what the experts recommend: General Recommendation: Most adults are advised to get 7-9 hours of sleep per night. Post-Brain Injury/Surgical Needs: These individuals often need more sleep than they did before injury, especially during the early stages of recovery.

## 2024-02-02 NOTE — Therapy (Signed)
 OUTPATIENT PHYSICAL THERAPY NEURO TREATMENT   Patient Name: Corey [name] MRN: [id number] DOB:1966-04-25, 58 y.o., male Today's Date: 02/02/2024   PCP: Joyce Norleen BROCKS, MD   REFERRING PROVIDER: Pegge Toribio PARAS, PA-C  END OF SESSION:  PT End of Session - 02/02/24 0849     Visit Number 5    Number of Visits 13    Date for PT Re-Evaluation 03/10/24    Authorization Type [employer]    PT Start Time 0847    PT Stop Time 0929    PT Time Calculation (min) 42 min    Equipment Utilized During Treatment Gait belt    Activity Tolerance Patient tolerated treatment well    Behavior During Therapy WFL for tasks assessed/performed          Past Medical History:  Diagnosis Date   Arthritis    knee, left   ED (erectile dysfunction)    Hyperlipidemia    Hypertension    Sleep apnea    CPAP - not using lately due to recall    Past Surgical History:  Procedure Laterality Date   ANKLE BONE SURGERY     APPLICATION OF INTRAOPERATIVE CT SCAN  12/07/2023   Procedure: APPLICATION OF INTRAOPERATIVE CT SCAN;  Surgeon: Claudene Penne ORN, MD;  Location: [facility];  Service: Neurosurgery;;   BACK SURGERY     COLLAR BONE SURGEY     COLONOSCOPY     FRACTURE SURGERY     HERNIA REPAIR     IR IVC FILTER PLMT / S&I /IMG GUID/MOD SED  12/12/2023   KNEE ARTHROSCOPY Left 2006   LAPAROSCOPIC APPENDECTOMY N/A 01/13/2021   Procedure: APPENDECTOMY LAPAROSCOPIC WITH LYSIS OF ADHESIONS;  Surgeon: Sheldon Standing, MD;  Location: [facility];  Service: General;  Laterality: N/A;   LUMBAR MICRODISCECTOMY     POLYPECTOMY     SKIN BIOPSY Left 08/11/2021   nodulocystic fat nercrosis   TOTAL KNEE ARTHROPLASTY Left 11/11/2020   Procedure: LEFT TOTAL KNEE ARTHROPLASTY;  Surgeon: Barbarann Oneil BROCKS, MD;  Location: [facility];  Service: Orthopedics;  Laterality: Left;   UMBILICAL HERNIA REPAIR     Patient Active Problem List   Diagnosis Date Noted   Anxiety 01/12/2024   Chronic low back pain 01/12/2024   Acute  embolism and thrombosis of unspecified deep veins of unspecified lower extremity (HCC) 01/12/2024   Coping style affecting medical condition 12/15/2023   Myelopathy concurrent with and due to spinal stenosis of thoracic region [facility]) 12/06/2023   Spinal stenosis 12/06/2023   Thoracic myelopathy 12/06/2023   Morbid obesity (HCC) 12/06/2023   Depression 12/06/2023   Radiculopathy, lumbar region 11/10/2023   Flexion contracture of knee, left 03/01/2023   Prediabetes 11/19/2022   Status post laparoscopic appendectomy 01/14/2021   H/O total knee replacement, left 11/11/2020   Leiomyoma 11/08/2020   Arthritis 09/26/2018   Onychomycosis 09/26/2018   Hyperlipidemia LDL goal <100 08/31/2016   OSA on CPAP 08/31/2016   Premature ejaculation 08/19/2011    ONSET DATE: 12/16/2023  REFERRING DIAG: M47.14 (ICD-10-CM) - Thoracic myelopathy  THERAPY DIAG:  Muscle weakness (generalized)  Unsteadiness on feet  Other abnormalities of gait and mobility  Other lack of coordination  Pain in thoracic spine  Rationale for Evaluation and Treatment: Rehabilitation  SUBJECTIVE:  SUBJECTIVE STATEMENT: He has not been back to the gym since last visit, but plans to go to the pool today and is requesting exercises for this.  He continues to have middle back pain especially with prolonged sitting.  He fell yesterday when coming outside without AD, he stepped down and fell backwards landing on his bottom.  He reports he feels fine since this happened and when he fell he rolled backwards which helped disperse the force.  No other stumbles.  He presents with rollator today.  Pt accompanied by: self   PERTINENT HISTORY: Status post T3 transpedicular decompression, T1-T4 PSF 12/07/2023 due to severe thoracic myelopathy with  progressive ambulatory functional loss per Dr. Penne Sharps neurosurgery.NO BRACE REQUIRED. Continues to have some pain in his mid back up to his neck. He does feel as though the weakness in his legs is improving however he continues to use a rollator. No new weakness, numbness or tingling.Of note his operative course was complicated after shown to have a DVT postoperatively on day 3. He continues to take Eliquis  for this   Got inpatient rehab and was discharged home 12/20/23  PMH: L knee arthritis, HLD, HTN, L knee replacement 2022  PAIN:  Are you having pain? Yes: NPRS scale: 2 Pain location: middle back Pain description: tightness, prickling Aggravating factors: prolonged sitting Relieving factors: laying down   LUE in sitting: Vitals:   02/02/24 0855 02/02/24 0857  BP: (!) 167/102 (!) 145/92  Pulse: 100 96      PRECAUTIONS: Back  Precaution/Restrictions Comments: spinal precautions, no brace needed per orders - spinal precautions lifted at 8/13 visit - can lift up to 25 lbs, can bend and twist to tolerance  FALLS: Has patient fallen in last 6 months? Yes. Number of falls 20, did not injure himself, legs were weak and didn't have strength in his legs  LIVING ENVIRONMENT: Lives with: lives with their spouse Lives in: House/apartment Stairs: Yes: External: 2 steps; none Has following equipment at home: Single point cane, Walker - 2 wheeled, Environmental consultant - 4 wheeled, shower chair, and raised grab bars  PLOF: Independent, Vocation/Vocational requirements: Estate agent , and Leisure: Working out, taking walks   PATIENT GOALS: Wants to get back to being independent, getting his strength back and walking on his own   OBJECTIVE:  Note: Objective measures were completed at Evaluation unless otherwise noted.  COGNITION: Overall cognitive status: Within functional limits for tasks assessed   SENSATION: Light touch: WFL Proprioception: WFL and with ankle DF/PF, noted some bouts of  clonus bilat when testing into ankle DF   Pt reporting numbness/tingling in L knee and both feet have numbness/tingling and coldness  Sometimes if he rubs up against something will feel warmth   COORDINATION: Heel to shin: slightly more difficult with LLE    POSTURE: No Significant postural limitations  LOWER EXTREMITY ROM:    Pt with limited L knee extension/flexion ROM due to hx of L knee replacement   LOWER EXTREMITY MMT:    MMT Right Eval Left Eval  Hip flexion 4+ 4-  Hip extension    Hip abduction 4+ 4+  Hip adduction 5 5  Hip internal rotation    Hip external rotation    Knee flexion 5 4+  Knee extension 4+ 4-  Ankle dorsiflexion 5 5  Ankle plantarflexion    Ankle inversion    Ankle eversion    (Blank rows = not tested)  All tested in sitting   BED MOBILITY:  Pt reporting no difficulties, performing the log roll   TRANSFERS: Sit to stand: SBA  Assistive device utilized: None     Stand to sit: SBA  Assistive device utilized: None      Able to perform with no UE support, wide BOS with LLE staggered behind R  Pt was very pleased with this, did not realize he could perform without his hands   GAIT: Findings: Gait Characteristics: step through pattern, decreased stride length, and narrow BOS, Distance walked: Clinic distances, Assistive device utilized:Single point cane and Walker - 2 wheeled, Level of assistance: Modified independence, SBA, and CGA, and Comments: mod I with RW, SBA/CGA with SPC   FUNCTIONAL TESTS:  5 times sit to stand: 17.8 seconds with no UE support  Timed up and go (TUG): 16.5 seconds with RW, 20.1 seconds with SPC  10 meter walk test: 17.8 seconds with RW = 1.84 ft/sec                                                                                                                                   TREATMENT DATE: 02/02/24 -Discussed and developed pool exercises (see below).  Pt IND and compliant to land based HEP not requiring  re-print. - :  11.38 sec = 0.88 m/sec OR 2.90 ft/sec w/ rollator SBA -TUG:  14.72 sec w/ rollator SBA  -TUG:  16.29 sec w/ SPC SBA  -Side-lying quad stretch, PT provides passive stretch then instructs in use of strap which is too much torsion on left knee so not recommended for home, demonstrated way to do in pool using noodle at ankle and pool wall support -Passive hamstring stretch x2 each LE -Supine butterfly stretch 2x45 sec bilaterally -LTRs x20 > Supine hip IR/ER stretch x20 w/ variable holds for comfort, cues to modify ROM to comfort  PATIENT EDUCATION: Education details: Using caution when falling and taking blood thinner medications - recommend MD follow-up especially if noticing persistent bruising following fall or other swelling/acute issues or anytime he hits head.  Recommend monitoring BP and logging on phone at consistent time each day to determine if he needs to speak to PCP about this.  Continue HEP, try aquatic HEP and bring questions to next session if any.   Person educated: Patient Education method: Explanation Education comprehension: verbalized understanding  HOME EXERCISE PROGRAM:  Access Code: [email or web address] URL: [email or web address] Date: 12/18/2023 Prepared by: Schuyler Batter   Exercises - Supine Hamstring Stretch with Strap  - 1 x daily - 7 x weekly - 3 sets - 10 reps  - Supine Heel Slide with Strap  - 1 x daily - 7 x weekly - 3 sets - 10 reps - Supine 90/90 Alternating Toe Touch  - 1 x daily - 7 x weekly - 3 sets - 10 reps - Dead Bug  - 1 x daily - 7 x weekly - 3 sets - 10 reps - Mini Squats with Vannie and  Chair  - 1 x daily - 7 x weekly - 4 sets - 10 reps - Side Stepping with Resistance at Ankles and Counter Support  - 1 x daily - 7 x weekly - 3 sets - 10 reps - Forward and Backward Monster Walk with Counter Support  - 1 x daily - 7 x weekly - 3 sets - 10 reps  FOR AQUATIC USE: Access Code: [email or web address] URL:  [email or web address] Date: 02/02/2024 Prepared by: Daved Bull  Exercises - Push-Up on Pool Wall  - 1 x daily - 7 x weekly - 3 sets - 10 reps - Forward and Backward Stepping at [employer]  - 1 x daily - 7 x weekly - 3 sets - 10 reps - Asymmetrical Shoulder Flexion Extension with Hand Floats  - 1 x daily - 7 x weekly - 3 sets - 10 reps - Heel Toe Raises at Pool Wall  - 1 x daily - 7 x weekly - 3 sets - 10 reps - Lateral Stepping at Pool Wall  - 1 x daily - 7 x weekly - 3 sets - 10 reps - Standing Hip Circles at [employer]  - 1 x daily - 7 x weekly - 3 sets - 10 reps - Lunge to Target at [employer]  - 1 x daily - 7 x weekly - 3 sets - 10 reps - Plank with Hip Extension at [employer]  - 1 x daily - 7 x weekly - 3 sets - 10 reps - Standing 3-Way Kick with Ankle Float at [employer]  - 1 x daily - 7 x weekly - 3 sets - 10 reps - Sitting Balance on Pool Noodle  - 1 x daily - 7 x weekly - 3 sets - 10 reps  GOALS: Goals reviewed with patient? Yes  SHORT TERM GOALS: Target date: 01/31/2024  Pt will be independent with initial HEP in order to build upon functional gains made in therapy. Baseline:  IND (9/3) Goal status: MET  2.  Pt will improve gait speed with LRAD to at least 2.2 ft/sec in order to demo improved community mobility.   Baseline: 17.8 seconds with RW = 1.84 ft/sec; 2.90 ft/sec w/ rollator (9/3) Goal status: MET  3.  Pt will improve TUG time to 17 seconds or less with SPC in order to demo decrease fall risk.  Baseline: 20.1 seconds with SPC; 16.29 sec w/ SPC SBA (9/3) Goal status: MET   LONG TERM GOALS: Target date: 02/21/2024  Pt will be independent with initial HEP in order to build upon functional gains made in therapy. Baseline:  Goal status: INITIAL  2. Pt will improve BERG to at least a 46/56 in order to demo decr fall risk.  Baseline: 38/56 Goal status: INITIAL  3.   Pt will improve TUG time to 13.5 seconds or less with SPC/no AD in order to demo  decrease fall risk. Baseline:  20.1 seconds with SPC; 16.29 sec w/ SPC SBA (9/3) Goal status: INITIAL  4.  Pt will improve gait speed with LRAD to at least 3.1 ft/sec in order to demo improved community mobility.  Baseline: 17.8 seconds with RW = 1.84 ft/sec; 2.90 ft/sec w/ rollator (9/3) Goal status: REVISED  5.  Pt will improve 5x sit<>stand to less than or equal to 14 sec to demonstrate improved functional strength and transfer efficiency.  Baseline: 17.8 seconds with no UE support Goal status: INITIAL  6.  Pt will ambulate at least 500' outdoors  on unlevel surfaces with LRAD in order to demo improved community mobility.  Baseline:  Goal status: INITIAL   ASSESSMENT:  CLINICAL IMPRESSION: Pt has had a recent fall and BP was elevated this visit.  He was educated to monitor and log BP at home and follow-up with PCP regarding fall due to being on blood thinners.  He met all STGs as written and plans to progress his activity level in pool setting so PT provided introductory aquatic regimen for him this visit.  His stiffness persists so he continues to benefit from skilled PT intervention to improve general mobility and tolerance.  Continue per POC.   OBJECTIVE IMPAIRMENTS: Abnormal gait, decreased activity tolerance, decreased balance, decreased coordination, decreased mobility, difficulty walking, decreased ROM, decreased strength, impaired flexibility, impaired sensation, postural dysfunction, and pain.   ACTIVITY LIMITATIONS: carrying, lifting, bending, stairs, transfers, and locomotion level  PARTICIPATION LIMITATIONS: shopping, community activity, occupation, and yard work  PERSONAL FACTORS: Behavior pattern, Past/current experiences, Time since onset of injury/illness/exacerbation, and 3+ comorbidities: L knee arthritis, HLD, HTN, L knee replacement 2022 are also affecting patient's functional outcome.   REHAB POTENTIAL: Good  CLINICAL DECISION MAKING: Evolving/moderate  complexity  EVALUATION COMPLEXITY: Moderate  PLAN:  PT FREQUENCY: 2x/week  PT DURATION: 8 weeks - anticipate originally 6 weeks   PLANNED INTERVENTIONS: [id number]- PT Re-evaluation, 97110-Therapeutic exercises, 97530- Therapeutic activity, 97112- Neuromuscular re-education, 97535- Self Care, [id number]- Manual therapy, [phone]- Gait training, [phone]- Aquatic Therapy, Patient/Family education, Balance training, Stair training, and DME instructions  PLAN FOR NEXT SESSION:  Update/revise HEP as appropriate, work on dynamic gait with cane, balance tasks esp SLS and working on decr UE support, BLE strength esp LLE    Daved KATHEE Bull, PT, DPT 02/02/2024, 10:00 AM

## 2024-02-04 ENCOUNTER — Ambulatory Visit: Admitting: Physical Therapy

## 2024-02-04 ENCOUNTER — Ambulatory Visit: Admitting: Occupational Therapy

## 2024-02-04 ENCOUNTER — Encounter: Payer: Self-pay | Admitting: Physical Therapy

## 2024-02-04 DIAGNOSIS — M4714 Other spondylosis with myelopathy, thoracic region: Secondary | ICD-10-CM

## 2024-02-04 DIAGNOSIS — R2689 Other abnormalities of gait and mobility: Secondary | ICD-10-CM | POA: Diagnosis not present

## 2024-02-04 DIAGNOSIS — R278 Other lack of coordination: Secondary | ICD-10-CM | POA: Diagnosis not present

## 2024-02-04 DIAGNOSIS — R2681 Unsteadiness on feet: Secondary | ICD-10-CM | POA: Diagnosis not present

## 2024-02-04 DIAGNOSIS — M6281 Muscle weakness (generalized): Secondary | ICD-10-CM

## 2024-02-04 DIAGNOSIS — M546 Pain in thoracic spine: Secondary | ICD-10-CM | POA: Diagnosis not present

## 2024-02-04 NOTE — Therapy (Signed)
 OUTPATIENT PHYSICAL THERAPY NEURO TREATMENT   Patient Name: Corey [name] MRN: [id number] DOB:[birth year], 58 y.o., male Today's Date: 02/04/2024   PCP: Joyce Norleen BROCKS, MD   REFERRING PROVIDER: Pegge Toribio PARAS, PA-C  END OF SESSION:  PT End of Session - 02/04/24 0804     Visit Number 6    Number of Visits 13    Date for PT Re-Evaluation 03/10/24    Authorization Type BLUE [employer]    PT Start Time 0801    PT Stop Time 0843    PT Time Calculation (min) 42 min    Equipment Utilized During Treatment Gait belt    Activity Tolerance Patient tolerated treatment well    Behavior During Therapy WFL for tasks assessed/performed          Past Medical History:  Diagnosis Date   Arthritis    knee, left   ED (erectile dysfunction)    Hyperlipidemia    Hypertension    Sleep apnea    CPAP - not using lately due to recall    Past Surgical History:  Procedure Laterality Date   ANKLE BONE SURGERY     APPLICATION OF INTRAOPERATIVE CT SCAN  12/07/2023   Procedure: APPLICATION OF INTRAOPERATIVE CT SCAN;  Surgeon: Claudene Penne ORN, MD;  Location: [facility];  Service: Neurosurgery;;   BACK SURGERY     COLLAR BONE SURGEY     COLONOSCOPY     FRACTURE SURGERY     HERNIA REPAIR     IR IVC FILTER PLMT / S&I /IMG GUID/MOD SED  12/12/2023   KNEE ARTHROSCOPY Left 2006   LAPAROSCOPIC APPENDECTOMY N/A 01/13/2021   Procedure: APPENDECTOMY LAPAROSCOPIC WITH LYSIS OF ADHESIONS;  Surgeon: Sheldon Standing, MD;  Location: [facility];  Service: General;  Laterality: N/A;   LUMBAR MICRODISCECTOMY     POLYPECTOMY     SKIN BIOPSY Left 08/11/2021   nodulocystic fat nercrosis   TOTAL KNEE ARTHROPLASTY Left 11/11/2020   Procedure: LEFT TOTAL KNEE ARTHROPLASTY;  Surgeon: Barbarann Oneil BROCKS, MD;  Location: [facility];  Service: Orthopedics;  Laterality: Left;   UMBILICAL HERNIA REPAIR     Patient Active Problem List   Diagnosis Date Noted   Anxiety 01/12/2024   Chronic low back pain 01/12/2024   Acute  embolism and thrombosis of unspecified deep veins of unspecified lower extremity (HCC) 01/12/2024   Coping style affecting medical condition 12/15/2023   Myelopathy concurrent with and due to spinal stenosis of thoracic region [facility]) 12/06/2023   Spinal stenosis 12/06/2023   Thoracic myelopathy 12/06/2023   Morbid obesity (HCC) 12/06/2023   Depression 12/06/2023   Radiculopathy, lumbar region 11/10/2023   Flexion contracture of knee, left 03/01/2023   Prediabetes 11/19/2022   Status post laparoscopic appendectomy 01/14/2021   H/O total knee replacement, left 11/11/2020   Leiomyoma 11/08/2020   Arthritis 09/26/2018   Onychomycosis 09/26/2018   Hyperlipidemia LDL goal <100 08/31/2016   OSA on CPAP 08/31/2016   Premature ejaculation 08/19/2011    ONSET DATE: 12/16/2023  REFERRING DIAG: M47.14 (ICD-10-CM) - Thoracic myelopathy  THERAPY DIAG:  Muscle weakness (generalized)  Other lack of coordination  Pain in thoracic spine  Unsteadiness on feet  Other abnormalities of gait and mobility  Rationale for Evaluation and Treatment: Rehabilitation  SUBJECTIVE:  SUBJECTIVE STATEMENT: He has had no more falls or near falls since previously mentioned and no abnormal symptoms or excessive bruising since last fall.  He has no pain today.  He presents with rollator today.  Pt accompanied by: self   PERTINENT HISTORY: Status post T3 transpedicular decompression, T1-T4 PSF 12/07/2023 due to severe thoracic myelopathy with progressive ambulatory functional loss per Dr. Penne Sharps neurosurgery.NO BRACE REQUIRED. Continues to have some pain in his mid back up to his neck. He does feel as though the weakness in his legs is improving however he continues to use a rollator. No new weakness, numbness or tingling.Of  note his operative course was complicated after shown to have a DVT postoperatively on day 3. He continues to take Eliquis  for this   Got inpatient rehab and was discharged home 12/20/23  PMH: L knee arthritis, HLD, HTN, L knee replacement 2022  PAIN:  Are you having pain? No   LUE in sitting: There were no vitals filed for this visit.     PRECAUTIONS: Back  Precaution/Restrictions Comments: spinal precautions, no brace needed per orders - spinal precautions lifted at 8/13 visit - can lift up to 25 lbs, can bend and twist to tolerance  FALLS: Has patient fallen in last 6 months? Yes. Number of falls 20, did not injure himself, legs were weak and didn't have strength in his legs  LIVING ENVIRONMENT: Lives with: lives with their spouse Lives in: House/apartment Stairs: Yes: External: 2 steps; none Has following equipment at home: Single point cane, Walker - 2 wheeled, Environmental consultant - 4 wheeled, shower chair, and raised grab bars  PLOF: Independent, Vocation/Vocational requirements: Estate agent , and Leisure: Working out, taking walks   PATIENT GOALS: Wants to get back to being independent, getting his strength back and walking on his own   OBJECTIVE:  Note: Objective measures were completed at Evaluation unless otherwise noted.  COGNITION: Overall cognitive status: Within functional limits for tasks assessed   SENSATION: Light touch: WFL Proprioception: WFL and with ankle DF/PF, noted some bouts of clonus bilat when testing into ankle DF   Pt reporting numbness/tingling in L knee and both feet have numbness/tingling and coldness  Sometimes if he rubs up against something will feel warmth   COORDINATION: Heel to shin: slightly more difficult with LLE    POSTURE: No Significant postural limitations  LOWER EXTREMITY ROM:    Pt with limited L knee extension/flexion ROM due to hx of L knee replacement   LOWER EXTREMITY MMT:    MMT Right Eval Left Eval  Hip flexion 4+  4-  Hip extension    Hip abduction 4+ 4+  Hip adduction 5 5  Hip internal rotation    Hip external rotation    Knee flexion 5 4+  Knee extension 4+ 4-  Ankle dorsiflexion 5 5  Ankle plantarflexion    Ankle inversion    Ankle eversion    (Blank rows = not tested)  All tested in sitting   BED MOBILITY:  Pt reporting no difficulties, performing the log roll   TRANSFERS: Sit to stand: SBA  Assistive device utilized: None     Stand to sit: SBA  Assistive device utilized: None      Able to perform with no UE support, wide BOS with LLE staggered behind R  Pt was very pleased with this, did not realize he could perform without his hands   GAIT: Findings: Gait Characteristics: step through pattern, decreased stride length, and narrow  BOS, Distance walked: Clinic distances, Assistive device utilized:Single point cane and Walker - 2 wheeled, Level of assistance: Modified independence, SBA, and CGA, and Comments: mod I with RW, SBA/CGA with SPC   FUNCTIONAL TESTS:  5 times sit to stand: 17.8 seconds with no UE support  Timed up and go (TUG): 16.5 seconds with RW, 20.1 seconds with SPC  10 meter walk test: 17.8 seconds with RW = 1.84 ft/sec                                                                                                                                   TREATMENT DATE: 02/04/24 -SciFit x8 minutes in multi-peaks mode up to level 5.0 for neural priming, dynamic cardiovascular warmup and BLE strengthening.  RPE 6-7/10 following task. -Standing hip openers x10 each side w/ light UE support -Seated march overs 2x10 each LE -Standing hip hinges x15 -4 hurdles forward and laterally w/ 3lb bilateral ankle wts 6x8 ft each -Standing march on airex w/ bilateral 3lb ankle weights > standing on airex w/ bilateral 3lb ankle wts for 8 midline cone taps 2x30 each working into unsupported SBA-CGA w/ intermittent LOB -6 lb midline slam balls 2x10 > L and R slams x10 each, pt reports  loosening up  PATIENT EDUCATION: Education details: Continue HEP, try aquatic HEP and bring questions to next session if any - he has not had a chance to try these yet.   Person educated: Patient Education method: Explanation Education comprehension: verbalized understanding  HOME EXERCISE PROGRAM:  Access Code: [email or web address] URL: [email or web address] Date: 12/18/2023 Prepared by: Schuyler Batter   Exercises - Supine Hamstring Stretch with Strap  - 1 x daily - 7 x weekly - 3 sets - 10 reps  - Supine Heel Slide with Strap  - 1 x daily - 7 x weekly - 3 sets - 10 reps - Supine 90/90 Alternating Toe Touch  - 1 x daily - 7 x weekly - 3 sets - 10 reps - Dead Bug  - 1 x daily - 7 x weekly - 3 sets - 10 reps - Mini Squats with Walker and Chair  - 1 x daily - 7 x weekly - 4 sets - 10 reps - Side Stepping with Resistance at Ankles and Counter Support  - 1 x daily - 7 x weekly - 3 sets - 10 reps - Forward and Backward Monster Walk with Counter Support  - 1 x daily - 7 x weekly - 3 sets - 10 reps  FOR AQUATIC USE: Access Code: [email or web address] URL: [email or web address] Date: 02/02/2024 Prepared by: Daved Bull  Exercises - Push-Up on Pool Wall  - 1 x daily - 7 x weekly - 3 sets - 10 reps - Forward and Backward Stepping at [employer]  - 1 x daily - 7 x weekly - 3 sets - 10 reps - Asymmetrical Shoulder Flexion Extension with Hand Floats  - 1 x daily -  7 x weekly - 3 sets - 10 reps - Heel Toe Raises at Pool Wall  - 1 x daily - 7 x weekly - 3 sets - 10 reps - Lateral Stepping at Pool Wall  - 1 x daily - 7 x weekly - 3 sets - 10 reps - Standing Hip Circles at [employer]  - 1 x daily - 7 x weekly - 3 sets - 10 reps - Lunge to Target at [employer]  - 1 x daily - 7 x weekly - 3 sets - 10 reps - Plank with Hip Extension at [employer]  - 1 x daily - 7 x weekly - 3 sets - 10 reps - Standing 3-Way Kick with Ankle Float at [employer]  - 1 x daily - 7 x weekly - 3 sets - 10 reps -  Sitting Balance on Pool Noodle  - 1 x daily - 7 x weekly - 3 sets - 10 reps  GOALS: Goals reviewed with patient? Yes  SHORT TERM GOALS: Target date: 01/31/2024  Pt will be independent with initial HEP in order to build upon functional gains made in therapy. Baseline:  IND (9/3) Goal status: MET  2.  Pt will improve gait speed with LRAD to at least 2.2 ft/sec in order to demo improved community mobility.   Baseline: 17.8 seconds with RW = 1.84 ft/sec; 2.90 ft/sec w/ rollator (9/3) Goal status: MET  3.  Pt will improve TUG time to 17 seconds or less with SPC in order to demo decrease fall risk.  Baseline: 20.1 seconds with SPC; 16.29 sec w/ SPC SBA (9/3) Goal status: MET   LONG TERM GOALS: Target date: 02/21/2024  Pt will be independent with initial HEP in order to build upon functional gains made in therapy. Baseline:  Goal status: INITIAL  2. Pt will improve BERG to at least a 46/56 in order to demo decr fall risk.  Baseline: 38/56 Goal status: INITIAL  3.   Pt will improve TUG time to 13.5 seconds or less with SPC/no AD in order to demo decrease fall risk. Baseline:  20.1 seconds with SPC; 16.29 sec w/ SPC SBA (9/3) Goal status: INITIAL  4.  Pt will improve gait speed with LRAD to at least 3.1 ft/sec in order to demo improved community mobility.  Baseline: 17.8 seconds with RW = 1.84 ft/sec; 2.90 ft/sec w/ rollator (9/3) Goal status: REVISED  5.  Pt will improve 5x sit<>stand to less than or equal to 14 sec to demonstrate improved functional strength and transfer efficiency.  Baseline: 17.8 seconds with no UE support Goal status: INITIAL  6.  Pt will ambulate at least 500' outdoors on unlevel surfaces with LRAD in order to demo improved community mobility.  Baseline:  Goal status: INITIAL   ASSESSMENT:  CLINICAL IMPRESSION: Emphasis of skilled PT session today on progression to hip hinging and weighted tasks to increase stability challenge and strength.  He was most  challenged by dynamic SLS and compliant surfaces.  Pt would benefit from further progression to light lifting regimen and high level dynamic challenges to advance to LRAD and PLOF as able.  Continue per POC.  OBJECTIVE IMPAIRMENTS: Abnormal gait, decreased activity tolerance, decreased balance, decreased coordination, decreased mobility, difficulty walking, decreased ROM, decreased strength, impaired flexibility, impaired sensation, postural dysfunction, and pain.   ACTIVITY LIMITATIONS: carrying, lifting, bending, stairs, transfers, and locomotion level  PARTICIPATION LIMITATIONS: shopping, community activity, occupation, and yard work  PERSONAL FACTORS: Behavior pattern, Past/current  experiences, Time since onset of injury/illness/exacerbation, and 3+ comorbidities: L knee arthritis, HLD, HTN, L knee replacement 2022 are also affecting patient's functional outcome.   REHAB POTENTIAL: Good  CLINICAL DECISION MAKING: Evolving/moderate complexity  EVALUATION COMPLEXITY: Moderate  PLAN:  PT FREQUENCY: 2x/week  PT DURATION: 8 weeks - anticipate originally 6 weeks   PLANNED INTERVENTIONS: 97164- PT Re-evaluation, 97110-Therapeutic exercises, 97530- Therapeutic activity, 97112- Neuromuscular re-education, 97535- Self Care, 02859- Manual therapy, [phone]- Gait training, [phone]- Aquatic Therapy, Patient/Family education, Balance training, Stair training, and DME instructions  PLAN FOR NEXT SESSION:  Update/revise HEP as appropriate, work on dynamic gait with cane, balance tasks esp SLS and working on decr UE support, BLE strength esp LLE- modified SLS tasks, dynamic stability work, resisted gait, lunge and squat variations   Daved KATHEE Bull, PT, DPT 02/04/2024, 8:44 AM

## 2024-02-04 NOTE — Therapy (Addendum)
 OUTPATIENT OCCUPATIONAL THERAPY NEURO TREATMENT  Patient Name: Corey [name] MRN: [id number] DOB:01/12/1966, 58 y.o., male Today's Date: 02/04/2024  PCP: Joyce Norleen BROCKS, MD REFERRING PROVIDER: Pegge Toribio PARAS, PA-C  END OF SESSION:  OT End of Session - 02/04/24 0930     Visit Number 6    Number of Visits 16   up to 16 visits (anticipate less O.T. needed)   Date for OT Re-Evaluation 03/11/24    Authorization Type BC/BS - VL 50 combined PT/OT    OT Start Time 0932    OT Stop Time 1017    OT Time Calculation (min) 45 min    Activity Tolerance Patient tolerated treatment well    Behavior During Therapy WFL for tasks assessed/performed         Past Medical History:  Diagnosis Date   Arthritis    knee, left   ED (erectile dysfunction)    Hyperlipidemia    Hypertension    Sleep apnea    CPAP - not using lately due to recall    Past Surgical History:  Procedure Laterality Date   ANKLE BONE SURGERY     APPLICATION OF INTRAOPERATIVE CT SCAN  12/07/2023   Procedure: APPLICATION OF INTRAOPERATIVE CT SCAN;  Surgeon: Claudene Penne ORN, MD;  Location: [facility];  Service: Neurosurgery;;   BACK SURGERY     COLLAR BONE SURGEY     COLONOSCOPY     FRACTURE SURGERY     HERNIA REPAIR     IR IVC FILTER PLMT / S&I /IMG GUID/MOD SED  12/12/2023   KNEE ARTHROSCOPY Left 2006   LAPAROSCOPIC APPENDECTOMY N/A 01/13/2021   Procedure: APPENDECTOMY LAPAROSCOPIC WITH LYSIS OF ADHESIONS;  Surgeon: Sheldon Standing, MD;  Location: [facility];  Service: General;  Laterality: N/A;   LUMBAR MICRODISCECTOMY     POLYPECTOMY     SKIN BIOPSY Left 08/11/2021   nodulocystic fat nercrosis   TOTAL KNEE ARTHROPLASTY Left 11/11/2020   Procedure: LEFT TOTAL KNEE ARTHROPLASTY;  Surgeon: Barbarann Oneil BROCKS, MD;  Location: [facility];  Service: Orthopedics;  Laterality: Left;   UMBILICAL HERNIA REPAIR     Patient Active Problem List   Diagnosis Date Noted   Anxiety 01/12/2024   Chronic low back pain 01/12/2024   Acute  embolism and thrombosis of unspecified deep veins of unspecified lower extremity (HCC) 01/12/2024   Coping style affecting medical condition 12/15/2023   Myelopathy concurrent with and due to spinal stenosis of thoracic region [facility]) 12/06/2023   Spinal stenosis 12/06/2023   Thoracic myelopathy 12/06/2023   Morbid obesity (HCC) 12/06/2023   Depression 12/06/2023   Radiculopathy, lumbar region 11/10/2023   Flexion contracture of knee, left 03/01/2023   Prediabetes 11/19/2022   Status post laparoscopic appendectomy 01/14/2021   H/O total knee replacement, left 11/11/2020   Leiomyoma 11/08/2020   Arthritis 09/26/2018   Onychomycosis 09/26/2018   Hyperlipidemia LDL goal <100 08/31/2016   OSA on CPAP 08/31/2016   Premature ejaculation 08/19/2011   ONSET DATE: 12/17/2023 (referral date)  REFERRING DIAG: M47.14 (ICD-10-CM) - Thoracic myelopathy  THERAPY DIAG:  Muscle weakness (generalized)  Other lack of coordination  Thoracic myelopathy  Rationale for Evaluation and Treatment: Rehabilitation  SUBJECTIVE:   SUBJECTIVE STATEMENT: No reports of pain today just some stiffness in the mid-back and the thighs (Chronic). Pt reports that he has been getting better sleep at night.  He is still experiencing a lack of sensation in R wrist.  Pt accompanied by: self  PERTINENT HISTORY: s/p emergent  T3 transpedicular decompression T1-4 PSF. PMH of back surgery, Lt TKA 2022, hernia repair, HTN, sleep apnea.  PRECAUTIONS: Other: no lifting > 25 lbs   WEIGHT BEARING RESTRICTIONS: No  PAIN:  Are you having pain? No  FALLS: Has patient fallen in last 6 months? Yes. 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  HAND DOMINANCE: Left  ADLs: Overall ADLs: some assist with LE dressing d/t back precautions and no ADL A/E except reacher Transfers/ambulation related to ADLs: Eating: independent Grooming: independent UB Dressing: independent LB Dressing: wife ties shoes, currently not wearing socks (should be wearing compression hoses) Toileting: independent Bathing: mod I  Tub Shower transfers: mod I  Equipment: Shower seat with back  IADLs: Shopping: wife doing currently Light housekeeping: wife doing currently Meal Prep: wife doing currently Community mobility: pt not released to drive yet Medication management: independent Handwriting: denies change  MOBILITY STATUS: Needs Assist: walker, cane in house  UPPER EXTREMITY ROM:  BUE AROM WNL's  UPPER EXTREMITY MMT:   grossly 5/5 BUE's  HAND FUNCTION: Grip strength: Right: 77.6 lbs; Left: 106.2 lbs  COORDINATION: 9 Hole Peg test: Right: 26 sec; Left: 28.38 sec  SENSATION: WFL  EDEMA: none in UE's  COGNITION: Overall cognitive status: Within functional limits for tasks assessed  VISION: Subjective report: no changes Baseline vision: Wears glasses all the time  PERCEPTION: Not tested  PRAXIS: Not tested  OBSERVATIONS: Pt pleasant, walks w/ walker to clinic                                                                                                                           TREATMENT:   -OT reviewed education of patient in safety considerations for loss of sensation as noted in patient instructions to reduce risk of injury to affected hand.  Pt reminded to be careful of sharp, hot/cold (check temperatures of water ), breakable, heavy objects and chemicals. Patient verbalized understanding by being able to recall all 5  precautions without any cueing.   -Patient completed 5 minutes on the UBE at resistance level 10, pedaling forward for 2.5 minutes and in reverse for the remaining duration.  -Patient participated in ADL/IADL simulation activities, including folding laundry, loading it into the washing machine, and retrieving/sorting food items from overhead cabinets. The patient tolerated 25 minutes of standing activity without signs or reports of fatigue and no needed rest breaks.   -Wrist flex and ext with tan (12.5 lbs) flex bar x 2 min for strength and endurance of affected extremity  Supination with tan (12.5 lbs) flex bar x 2 min for strength and endurance of affected extremity  Pronation with tan (12.5 lbs) flex bar x 2 min for strength and endurance of affected extremity.    Also rolled flex bar back and forth and tapped it on the table for 2 minutes to stimulate sensation in the R wrist.  PATIENT EDUCATION: Education details: see above Person educated: Patient Education method: Explanation, Demonstration, and Verbal cues Education comprehension: verbalized understanding and returned demonstration  HOME EXERCISE PROGRAM: 01/19/2024: putty; coordination; golf solitaire; compression donning 01/27/24: theraband HEP  02/02/2024: sensory precautions; sleep hygiene  GOALS: Goals reviewed with patient? Yes  SHORT TERM GOALS:  MET 3/3 goals   LONG TERM GOALS: Target date: 03/11/24  Pt to perform standing tasks for 25 min w/o rest for light IADLS Baseline:  Goal status: MET  2.  Pt to return to cooking tasks and light cleaning tasks at mod I level Baseline:  02/02/2024: uses air fryer to make chicken; dishes and laundry; still not taking out trash or cleaning shower Goal status: INITIAL  3.  Pt to increase Rt grip strength by 10 lbs Baseline: 77 lbs (Lt = 106 lbs)  02/02/2024: no improvement Goal status: IN PROGRESS  4.  Pt to improve Lt hand coordination as evidenced by reducing speed on 9  hole peg test by 3 sec or more Baseline: 28 sec.  02/02/2024: 26 seconds; 21 seconds Goal status: MET  ASSESSMENT:  CLINICAL IMPRESSION: Patient still presents with a lack of sensation in R wrist. He demonstrates good understanding of precautions and strategies needed at home to progress towards goals. He has met LTG #1 today and will continue to benefit from skilled outpatient OT to focus on RUE strengthening and grip to improve functional independence in ADL's and IADL's.  .  PERFORMANCE DEFICITS: in functional skills including ADLs, IADLs, coordination, strength, Fine motor control, mobility, balance, body mechanics, endurance, decreased knowledge of precautions, and decreased knowledge of use of DME.   IMPAIRMENTS: are limiting patient from ADLs, IADLs, work, and leisure.   CO-MORBIDITIES: may have co-morbidities  that affects occupational performance. Patient will benefit from skilled OT to address above impairments and improve overall function.  REHAB POTENTIAL: Good  PLAN:  OT FREQUENCY: 1-2x/week  OT DURATION: up to 8 weeks  PLANNED INTERVENTIONS: 97535 self care/ADL training, 02889 therapeutic exercise, 97530 therapeutic activity, 97112 neuromuscular re-education, 97140 manual therapy, 97113 aquatic therapy, 97010 moist heat, balance training, functional mobility training, patient/family education, and DME and/or AE instructions  RECOMMENDED OTHER SERVICES: none at this time  CONSULTED AND AGREED WITH PLAN OF CARE: Patient  PLAN FOR NEXT SESSION:  Strengthening activities Grip and grasp activities progress towards LTGs   Kalya Troeger, Student-OT 02/04/2024, 2:17 PM

## 2024-02-08 ENCOUNTER — Encounter: Payer: Self-pay | Admitting: Physical Therapy

## 2024-02-08 ENCOUNTER — Ambulatory Visit: Admitting: Occupational Therapy

## 2024-02-08 ENCOUNTER — Encounter: Payer: Self-pay | Admitting: Occupational Therapy

## 2024-02-08 ENCOUNTER — Ambulatory Visit: Admitting: Physical Therapy

## 2024-02-08 DIAGNOSIS — M6281 Muscle weakness (generalized): Secondary | ICD-10-CM | POA: Diagnosis not present

## 2024-02-08 DIAGNOSIS — R2681 Unsteadiness on feet: Secondary | ICD-10-CM | POA: Diagnosis not present

## 2024-02-08 DIAGNOSIS — M4714 Other spondylosis with myelopathy, thoracic region: Secondary | ICD-10-CM | POA: Diagnosis not present

## 2024-02-08 DIAGNOSIS — M546 Pain in thoracic spine: Secondary | ICD-10-CM

## 2024-02-08 DIAGNOSIS — R278 Other lack of coordination: Secondary | ICD-10-CM | POA: Diagnosis not present

## 2024-02-08 DIAGNOSIS — R2689 Other abnormalities of gait and mobility: Secondary | ICD-10-CM

## 2024-02-08 NOTE — Therapy (Signed)
 OUTPATIENT PHYSICAL THERAPY NEURO TREATMENT   Patient Name: Corey [name] MRN: [id number] DOB:Feb 02, 1966, 59 y.o., male Today's Date: 02/08/2024   PCP: Joyce Norleen BROCKS, MD   REFERRING PROVIDER: Pegge Toribio PARAS, PA-C  END OF SESSION:  PT End of Session - 02/08/24 0930     Visit Number 7    Number of Visits 13    Date for PT Re-Evaluation 03/10/24    Authorization Type BLUE CROSS BLUE [employer]IELD    PT Start Time 0929    PT Stop Time 1014    PT Time Calculation (min) 45 min    Equipment Utilized During Treatment Gait belt    Activity Tolerance Patient tolerated treatment well    Behavior During Therapy WFL for tasks assessed/performed          Past Medical History:  Diagnosis Date   Arthritis    knee, left   ED (erectile dysfunction)    Hyperlipidemia    Hypertension    Sleep apnea    CPAP - not using lately due to recall    Past Surgical History:  Procedure Laterality Date   ANKLE BONE SURGERY     APPLICATION OF INTRAOPERATIVE CT SCAN  12/07/2023   Procedure: APPLICATION OF INTRAOPERATIVE CT SCAN;  Surgeon: Claudene Penne ORN, MD;  Location: [facility] OR[facility];  Service: Neurosurgery;;   BACK SURGERY     COLLAR BONE SURGEY     COLONOSCOPY     FRACTURE SURGERY     HERNIA REPAIR     IR IVC FILTER PLMT / S&I /IMG GUID/MOD SED  12/12/2023   KNEE ARTHROSCOPY Left 2006   LAPAROSCOPIC APPENDECTOMY N/A 01/13/2021   Procedure: APPENDECTOMY LAPAROSCOPIC WITH LYSIS OF ADHESIONS;  Surgeon: Sheldon Standing, MD;  Location: [facility];  Service: General;  Laterality: N/A;   LUMBAR MICRODISCECTOMY     POLYPECTOMY     SKIN BIOPSY Left 08/11/2021   nodulocystic fat nercrosis   TOTAL KNEE ARTHROPLASTY Left 11/11/2020   Procedure: LEFT TOTAL KNEE ARTHROPLASTY;  Surgeon: Barbarann Oneil BROCKS, MD;  Location: [facility];  Service: Orthopedics;  Laterality: Left;   UMBILICAL HERNIA REPAIR     Patient Active Problem List   Diagnosis Date Noted   Anxiety 01/12/2024   Chronic low back pain 01/12/2024   Acute  embolism and thrombosis of unspecified deep veins of unspecified lower extremity (HCC) 01/12/2024   Coping style affecting medical condition 12/15/2023   Myelopathy concurrent with and due to spinal stenosis of thoracic region [facility]) 12/06/2023   Spinal stenosis 12/06/2023   Thoracic myelopathy 12/06/2023   Morbid obesity (HCC) 12/06/2023   Depression 12/06/2023   Radiculopathy, lumbar region 11/10/2023   Flexion contracture of knee, left 03/01/2023   Prediabetes 11/19/2022   Status post laparoscopic appendectomy 01/14/2021   H/O total knee replacement, left 11/11/2020   Leiomyoma 11/08/2020   Arthritis 09/26/2018   Onychomycosis 09/26/2018   Hyperlipidemia LDL goal <100 08/31/2016   OSA on CPAP 08/31/2016   Premature ejaculation 08/19/2011    ONSET DATE: 12/16/2023  REFERRING DIAG: M47.14 (ICD-10-CM) - Thoracic myelopathy  THERAPY DIAG:  Muscle weakness (generalized)  Other lack of coordination  Pain in thoracic spine  Unsteadiness on feet  Other abnormalities of gait and mobility  Rationale for Evaluation and Treatment: Rehabilitation  SUBJECTIVE:  SUBJECTIVE STATEMENT: He has had no more falls or near falls since previously mentioned.  He has no pain today.  He presents with rollator today.  He has tried cleaning the tub but this is hard right now due to trying to get on the floor.  Pt accompanied by: self   PERTINENT HISTORY: Status post T3 transpedicular decompression, T1-T4 PSF 12/07/2023 due to severe thoracic myelopathy with progressive ambulatory functional loss per Dr. Penne Sharps neurosurgery.NO BRACE REQUIRED. Continues to have some pain in his mid back up to his neck. He does feel as though the weakness in his legs is improving however he continues to use a rollator. No new  weakness, numbness or tingling.Of note his operative course was complicated after shown to have a DVT postoperatively on day 3. He continues to take Eliquis  for this   Got inpatient rehab and was discharged home 12/20/23  PMH: L knee arthritis, HLD, HTN, L knee replacement 2022  PAIN:  Are you having pain? No   LUE in sitting: There were no vitals filed for this visit.     PRECAUTIONS: Back  Precaution/Restrictions Comments: spinal precautions, no brace needed per orders - spinal precautions lifted at 8/13 visit - can lift up to 25 lbs, can bend and twist to tolerance  FALLS: Has patient fallen in last 6 months? Yes. Number of falls 20, did not injure himself, legs were weak and didn't have strength in his legs  LIVING ENVIRONMENT: Lives with: lives with their spouse Lives in: House/apartment Stairs: Yes: External: 2 steps; none Has following equipment at home: Single point cane, Walker - 2 wheeled, Environmental consultant - 4 wheeled, shower chair, and raised grab bars  PLOF: Independent, Vocation/Vocational requirements: Estate agent , and Leisure: Working out, taking walks   PATIENT GOALS: Wants to get back to being independent, getting his strength back and walking on his own   OBJECTIVE:  Note: Objective measures were completed at Evaluation unless otherwise noted.  COGNITION: Overall cognitive status: Within functional limits for tasks assessed   SENSATION: Light touch: WFL Proprioception: WFL and with ankle DF/PF, noted some bouts of clonus bilat when testing into ankle DF   Pt reporting numbness/tingling in L knee and both feet have numbness/tingling and coldness  Sometimes if he rubs up against something will feel warmth   COORDINATION: Heel to shin: slightly more difficult with LLE    POSTURE: No Significant postural limitations  LOWER EXTREMITY ROM:    Pt with limited L knee extension/flexion ROM due to hx of L knee replacement   LOWER EXTREMITY MMT:    MMT  Right Eval Left Eval  Hip flexion 4+ 4-  Hip extension    Hip abduction 4+ 4+  Hip adduction 5 5  Hip internal rotation    Hip external rotation    Knee flexion 5 4+  Knee extension 4+ 4-  Ankle dorsiflexion 5 5  Ankle plantarflexion    Ankle inversion    Ankle eversion    (Blank rows = not tested)  All tested in sitting   BED MOBILITY:  Pt reporting no difficulties, performing the log roll   TRANSFERS: Sit to stand: SBA  Assistive device utilized: None     Stand to sit: SBA  Assistive device utilized: None      Able to perform with no UE support, wide BOS with LLE staggered behind R  Pt was very pleased with this, did not realize he could perform without his hands   GAIT:  Findings: Gait Characteristics: step through pattern, decreased stride length, and narrow BOS, Distance walked: Clinic distances, Assistive device utilized:Single point cane and Walker - 2 wheeled, Level of assistance: Modified independence, SBA, and CGA, and Comments: mod I with RW, SBA/CGA with SPC   FUNCTIONAL TESTS:  5 times sit to stand: 17.8 seconds with no UE support  Timed up and go (TUG): 16.5 seconds with RW, 20.1 seconds with SPC  10 meter walk test: 17.8 seconds with RW = 1.84 ft/sec                                                                                                                                   TREATMENT DATE: 02/08/24 RAMP:  Level of Assistance: SBA Assistive device utilized: Single point cane and standalone cane Ramp Comments: Performed x3 as pt has single brief pause in gait due to posterior lean during descent with initial SPC attempt, corrected on second attempt.  CURB:  Level of Assistance: SBA Assistive device utilized: Single point cane and standalone cane Curb Comments: Performed x3 w/ min cues for sequencing and to decrease weight bearing on cane.  STAIRS:  Level of Assistance: SBA  Stair Negotiation Technique: Step to Pattern Forwards With use of AD:  standalone and SPC with No Rails  Number of Stairs: 4x4   Height of Stairs: 6 in  Comments: Good sequencing carryover from curb step, no LOB.  GAIT: Gait pattern: step through pattern, lateral lean- Right, and narrow BOS Distance walked: 200 ft x2 (indoors) + 300 ft (outdoor sidewalk) Assistive device utilized: Single point cane and standalone cane Level of assistance: SBA and CGA Comments: Pt has single forward LOB w/ ind recovery when R knee spontaneously buckled.  He wears copper knee sleeves on both for comfort and stability and feels they help a little.  He has better placement of SPC than standalone w/o bumping RLE during positioning cane or swing phase of gait.  Obstacle course (3x4 hurdles w/ airex stepover > cone weaving) using SPC x2 > w/ SPC holding wide cone and 1lb ball x2 > w/ SPC holding narrow cone and 1lb ball x2 5lb kettlebell deadlift x5, good form > B stance 5lb RDL x8 each LE, good form - pt endorses no pain  PATIENT EDUCATION: Education details: Continue HEP, try aquatic HEP and bring questions to next session if any - he has not had a chance to try these yet (discussed ways to safely work in pool and manage/monitor fatigue w/ family member present as pt is worried he may be very tired exiting pool).  Discussed transitioning to cane at home (confirmed he has a [facility]), assessing appropriate cane height, and gradually working into longer walks and busier environments.  Encouraged decluttering walkways and using night lights to decrease risk of falls in home.  Discussed ways to safely carry things with cane, but to not do this the first few times he is using  the cane at home so he can better adjust with less risk of falls. Person educated: Patient Education method: Explanation Education comprehension: verbalized understanding  HOME EXERCISE PROGRAM:  Access Code: [email or web address] URL: [email or web address] Date: 12/18/2023 Prepared by: Schuyler Batter    Exercises - Supine Hamstring Stretch with Strap  - 1 x daily - 7 x weekly - 3 sets - 10 reps  - Supine Heel Slide with Strap  - 1 x daily - 7 x weekly - 3 sets - 10 reps - Supine 90/90 Alternating Toe Touch  - 1 x daily - 7 x weekly - 3 sets - 10 reps - Dead Bug  - 1 x daily - 7 x weekly - 3 sets - 10 reps - Mini Squats with Walker and Chair  - 1 x daily - 7 x weekly - 4 sets - 10 reps - Side Stepping with Resistance at Ankles and Counter Support  - 1 x daily - 7 x weekly - 3 sets - 10 reps - Forward and Backward Monster Walk with Counter Support  - 1 x daily - 7 x weekly - 3 sets - 10 reps  FOR AQUATIC USE: Access Code: [email or web address] URL: [email or web address] Date: 02/02/2024 Prepared by: Daved Bull  Exercises - Push-Up on Pool Wall  - 1 x daily - 7 x weekly - 3 sets - 10 reps - Forward and Backward Stepping at [employer]  - 1 x daily - 7 x weekly - 3 sets - 10 reps - Asymmetrical Shoulder Flexion Extension with Hand Floats  - 1 x daily - 7 x weekly - 3 sets - 10 reps - Heel Toe Raises at Pool Wall  - 1 x daily - 7 x weekly - 3 sets - 10 reps - Lateral Stepping at Pool Wall  - 1 x daily - 7 x weekly - 3 sets - 10 reps - Standing Hip Circles at [employer]  - 1 x daily - 7 x weekly - 3 sets - 10 reps - Lunge to Target at [employer]  - 1 x daily - 7 x weekly - 3 sets - 10 reps - Plank with Hip Extension at [employer]  - 1 x daily - 7 x weekly - 3 sets - 10 reps - Standing 3-Way Kick with Ankle Float at [employer]  - 1 x daily - 7 x weekly - 3 sets - 10 reps - Sitting Balance on Pool Noodle  - 1 x daily - 7 x weekly - 3 sets - 10 reps  GOALS: Goals reviewed with patient? Yes  SHORT TERM GOALS: Target date: 01/31/2024  Pt will be independent with initial HEP in order to build upon functional gains made in therapy. Baseline:  IND (9/3) Goal status: MET  2.  Pt will improve gait speed with LRAD to at least 2.2 ft/sec in order to demo improved community mobility.    Baseline: 17.8 seconds with RW = 1.84 ft/sec; 2.90 ft/sec w/ rollator (9/3) Goal status: MET  3.  Pt will improve TUG time to 17 seconds or less with SPC in order to demo decrease fall risk.  Baseline: 20.1 seconds with SPC; 16.29 sec w/ SPC SBA (9/3) Goal status: MET   LONG TERM GOALS: Target date: 02/21/2024  Pt will be independent with initial HEP in order to build upon functional gains made in therapy. Baseline:  Goal status: INITIAL  2. Pt will improve BERG to at least a 46/56 in order to demo decr  fall risk.  Baseline: 38/56 Goal status: INITIAL  3.   Pt will improve TUG time to 13.5 seconds or less with SPC/no AD in order to demo decrease fall risk. Baseline:  20.1 seconds with SPC; 16.29 sec w/ SPC SBA (9/3) Goal status: INITIAL  4.  Pt will improve gait speed with LRAD to at least 3.1 ft/sec in order to demo improved community mobility.  Baseline: 17.8 seconds with RW = 1.84 ft/sec; 2.90 ft/sec w/ rollator (9/3) Goal status: REVISED  5.  Pt will improve 5x sit<>stand to less than or equal to 14 sec to demonstrate improved functional strength and transfer efficiency.  Baseline: 17.8 seconds with no UE support Goal status: INITIAL  6.  Pt will ambulate at least 500' outdoors on unlevel surfaces with LRAD in order to demo improved community mobility.  Baseline:  Goal status: INITIAL   ASSESSMENT:  CLINICAL IMPRESSION: Emphasis of skilled PT session today on initiating appropriate transition to cane as pt has been attempting transition to cane and no device use at home.  As he has had a recent fall without device and had an instance of R knee buckle during outdoor ambulation today PT felt cane option best suits him at current.  He was encouraged to use the rollator as needed during transition due to possible limitations in endurance.  Overall he demonstrates adequate mechanics with SPC and standalone options this visit.  He would benefit from further progression of  lifting variations as continued today to improve balance, posture, and strength of BLE.  Continue per POC.  OBJECTIVE IMPAIRMENTS: Abnormal gait, decreased activity tolerance, decreased balance, decreased coordination, decreased mobility, difficulty walking, decreased ROM, decreased strength, impaired flexibility, impaired sensation, postural dysfunction, and pain.   ACTIVITY LIMITATIONS: carrying, lifting, bending, stairs, transfers, and locomotion level  PARTICIPATION LIMITATIONS: shopping, community activity, occupation, and yard work  PERSONAL FACTORS: Behavior pattern, Past/current experiences, Time since onset of injury/illness/exacerbation, and 3+ comorbidities: L knee arthritis, HLD, HTN, L knee replacement 2022 are also affecting patient's functional outcome.   REHAB POTENTIAL: Good  CLINICAL DECISION MAKING: Evolving/moderate complexity  EVALUATION COMPLEXITY: Moderate  PLAN:  PT FREQUENCY: 2x/week  PT DURATION: 8 weeks - anticipate originally 6 weeks   PLANNED INTERVENTIONS: 97164- PT Re-evaluation, 97110-Therapeutic exercises, 97530- Therapeutic activity, [id number]- Neuromuscular re-education, 97535- Self Care, [id number]- Manual therapy, [phone]- Gait training, [phone]- Aquatic Therapy, Patient/Family education, Balance training, Stair training, and DME instructions  PLAN FOR NEXT SESSION:  Update/revise HEP as appropriate, gait with cane, balance tasks esp SLS and working on decr UE support, BLE strength esp LLE- modified SLS tasks, dynamic stability work, resisted gait/step outs, lunge and squat variations; floor recovery.   Daved KATHEE Bull, PT, DPT 02/08/2024, 10:43 AM

## 2024-02-08 NOTE — Therapy (Signed)
 OUTPATIENT OCCUPATIONAL THERAPY NEURO TREATMENT  Patient Name: Corey [name] MRN: [id number] DOB:1966-03-28, 58 y.o., male Today's Date: 02/08/2024  PCP: Joyce Norleen BROCKS, MD REFERRING PROVIDER: Pegge Toribio PARAS, PA-C  END OF SESSION:  OT End of Session - 02/08/24 0856     Visit Number 7    Number of Visits 16   up to 16 visits (anticipate less O.T. needed)   Date for OT Re-Evaluation 03/11/24    Authorization Type BC/BS - VL 50 combined PT/OT    OT Start Time 0850    OT Stop Time 0930    OT Time Calculation (min) 40 min    Activity Tolerance Patient tolerated treatment well    Behavior During Therapy WFL for tasks assessed/performed         Past Medical History:  Diagnosis Date   Arthritis    knee, left   ED (erectile dysfunction)    Hyperlipidemia    Hypertension    Sleep apnea    CPAP - not using lately due to recall    Past Surgical History:  Procedure Laterality Date   ANKLE BONE SURGERY     APPLICATION OF INTRAOPERATIVE CT SCAN  12/07/2023   Procedure: APPLICATION OF INTRAOPERATIVE CT SCAN;  Surgeon: Claudene Penne ORN, MD;  Location: [facility] ORS;  Service: Neurosurgery;;   BACK SURGERY     COLLAR BONE SURGEY     COLONOSCOPY     FRACTURE SURGERY     HERNIA REPAIR     IR IVC FILTER PLMT / S&I /IMG GUID/MOD SED  12/12/2023   KNEE ARTHROSCOPY Left 2006   LAPAROSCOPIC APPENDECTOMY N/A 01/13/2021   Procedure: APPENDECTOMY LAPAROSCOPIC WITH LYSIS OF ADHESIONS;  Surgeon: Sheldon Standing, MD;  Location: [facility];  Service: General;  Laterality: N/A;   LUMBAR MICRODISCECTOMY     POLYPECTOMY     SKIN BIOPSY Left 08/11/2021   nodulocystic fat nercrosis   TOTAL KNEE ARTHROPLASTY Left 11/11/2020   Procedure: LEFT TOTAL KNEE ARTHROPLASTY;  Surgeon: Barbarann Oneil BROCKS, MD;  Location: [facility];  Service: Orthopedics;  Laterality: Left;   UMBILICAL HERNIA REPAIR     Patient Active Problem List   Diagnosis Date Noted   Anxiety 01/12/2024   Chronic low back pain 01/12/2024   Acute  embolism and thrombosis of unspecified deep veins of unspecified lower extremity (HCC) 01/12/2024   Coping style affecting medical condition 12/15/2023   Myelopathy concurrent with and due to spinal stenosis of thoracic region [facility]) 12/06/2023   Spinal stenosis 12/06/2023   Thoracic myelopathy 12/06/2023   Morbid obesity (HCC) 12/06/2023   Depression 12/06/2023   Radiculopathy, lumbar region 11/10/2023   Flexion contracture of knee, left 03/01/2023   Prediabetes 11/19/2022   Status post laparoscopic appendectomy 01/14/2021   H/O total knee replacement, left 11/11/2020   Leiomyoma 11/08/2020   Arthritis 09/26/2018   Onychomycosis 09/26/2018   Hyperlipidemia LDL goal <100 08/31/2016   OSA on CPAP 08/31/2016   Premature ejaculation 08/19/2011   ONSET DATE: 12/17/2023 (referral date)  REFERRING DIAG: M47.14 (ICD-10-CM) - Thoracic myelopathy  THERAPY DIAG:  Muscle weakness (generalized)  Unsteadiness on feet  Rationale for Evaluation and Treatment: Rehabilitation  SUBJECTIVE:   SUBJECTIVE STATEMENT: No reports of pain today just some stiffness in the mid-back and the thighs (Chronic). No recent falls Pt accompanied by: self  PERTINENT HISTORY: s/p emergent T3 transpedicular decompression T1-4 PSF. PMH of back surgery, Lt TKA 2022, hernia repair, HTN, sleep apnea.  PRECAUTIONS: Other: no lifting > 25 lbs  WEIGHT BEARING RESTRICTIONS: No  PAIN:  Are you having pain? No  FALLS: Has patient fallen in last 6 months? Yes. 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  HAND DOMINANCE: Left  ADLs: Overall ADLs: some assist with LE dressing d/t back precautions and no ADL A/E except reacher Transfers/ambulation related to ADLs: Eating: independent Grooming: independent UB Dressing: independent LB Dressing: wife ties shoes, currently not wearing socks (should be wearing compression hoses) Toileting: independent Bathing: mod I  Tub Shower transfers: mod I  Equipment: Shower seat with back  IADLs: Shopping: wife doing currently Light housekeeping: wife doing currently Meal Prep: wife doing currently Community mobility: pt not released to drive yet Medication management: independent Handwriting: denies change  MOBILITY STATUS: Needs Assist: walker, cane in house  UPPER EXTREMITY ROM:  BUE AROM WNL's  UPPER EXTREMITY MMT:   grossly 5/5 BUE's  HAND FUNCTION: Grip strength: Right: 77.6 lbs; Left: 106.2 lbs  COORDINATION: 9 Hole Peg test: Right: 26 sec; Left: 28.38 sec  SENSATION: WFL  EDEMA: none in UE's  COGNITION: Overall cognitive status: Within functional limits for tasks assessed  VISION: Subjective report: no changes Baseline vision: Wears glasses all the time  PERCEPTION: Not tested  PRAXIS: Not tested  OBSERVATIONS: Pt pleasant, walks w/ walker to clinic                                                                                                                           TREATMENT:   UBE x 10 mintues, level 3 for UE strength/endurance (doing 5 minutes pedaling forward, 5 min backwards)  Pt reports he has begun taking out trash and recycle but doing at the same time down 2 front steps w/ nothing to hold onto. Discussed safer options for this to prevent falls and recommended he discuss and simulate with P.T.  Practiced walking with cane in Rt hand, and carrying plate of food in Lt hand w/ no spills or LOB. Progressed to simulating taking  trash out holding cane in Rt hand, and dirty linen bag w/ weighted ball in Lt hand - pt able to do again w/o LOB (therapist using gait belt)  Discussed practicing negotiating stairs w/ this during P.T. session  Gripper set at level 4 resistance to pick up blocks Rt hand for sustained grip strength, proprioceptive input, and functional use   PATIENT EDUCATION: Education details: see above Person educated: Patient Education method: Explanation, Demonstration, and Verbal cues Education comprehension: verbalized understanding and returned demonstration  HOME EXERCISE  PROGRAM: 01/19/2024: putty; coordination; golf solitaire; compression donning 01/27/24: theraband HEP  02/02/2024: sensory precautions; sleep hygiene  GOALS: Goals reviewed with patient? Yes  SHORT TERM GOALS:  MET 3/3 goals   LONG TERM GOALS: Target date: 03/11/24  Pt to perform standing tasks for 25 min w/o rest for light IADLS Baseline:  Goal status: MET  2.  Pt to return to cooking tasks and light cleaning tasks at mod I level Baseline:  02/02/2024: uses air fryer to make chicken; dishes and laundry; still not taking out trash or cleaning shower Goal status: IN PROGRESS  3.  Pt to increase Rt grip strength by 10 lbs Baseline: 77 lbs (Lt = 106 lbs)  02/02/2024: no improvement Goal status: IN PROGRESS  4.  Pt to improve Lt hand coordination as evidenced by reducing speed on 9 hole peg test by 3 sec or more Baseline: 28 sec.  02/02/2024: 26 seconds; 21 seconds Goal status: MET  ASSESSMENT:  CLINICAL IMPRESSION: Patient has met all STG's and 2 LTG's w/ improved balance. Pt will continue to benefit from skilled outpatient OT to focus on RUE strengthening and grip to improve functional independence in ADL's and IADL's.  .  PERFORMANCE DEFICITS: in functional skills including ADLs, IADLs, coordination, strength, Fine motor control, mobility, balance, body mechanics, endurance, decreased knowledge of precautions, and  decreased knowledge of use of DME.   IMPAIRMENTS: are limiting patient from ADLs, IADLs, work, and leisure.   CO-MORBIDITIES: may have co-morbidities  that affects occupational performance. Patient will benefit from skilled OT to address above impairments and improve overall function.  REHAB POTENTIAL: Good  PLAN:  OT FREQUENCY: 1-2x/week  OT DURATION: up to 8 weeks  PLANNED INTERVENTIONS: 97535 self care/ADL training, 02889 therapeutic exercise, 97530 therapeutic activity, 97112 neuromuscular re-education, 97140 manual therapy, 97113 aquatic therapy, 97010 moist heat, balance training, functional mobility training, patient/family education, and DME and/or AE instructions  RECOMMENDED OTHER SERVICES: none at this time  CONSULTED AND AGREED WITH PLAN OF CARE: Patient  PLAN FOR NEXT SESSION:  Strengthening activities Grip and grasp activities progress towards LTGs   Burnard JINNY Roads, OT 02/08/2024, 8:57 AM

## 2024-02-09 ENCOUNTER — Other Ambulatory Visit: Payer: Self-pay | Admitting: Registered Nurse

## 2024-02-10 ENCOUNTER — Encounter: Payer: Self-pay | Admitting: Physical Therapy

## 2024-02-10 ENCOUNTER — Ambulatory Visit: Admitting: Physical Therapy

## 2024-02-10 ENCOUNTER — Ambulatory Visit: Admitting: Occupational Therapy

## 2024-02-10 VITALS — BP 134/89 | HR 97

## 2024-02-10 DIAGNOSIS — M6281 Muscle weakness (generalized): Secondary | ICD-10-CM

## 2024-02-10 DIAGNOSIS — R278 Other lack of coordination: Secondary | ICD-10-CM

## 2024-02-10 DIAGNOSIS — R2689 Other abnormalities of gait and mobility: Secondary | ICD-10-CM | POA: Diagnosis not present

## 2024-02-10 DIAGNOSIS — M4714 Other spondylosis with myelopathy, thoracic region: Secondary | ICD-10-CM | POA: Diagnosis not present

## 2024-02-10 DIAGNOSIS — R2681 Unsteadiness on feet: Secondary | ICD-10-CM | POA: Diagnosis not present

## 2024-02-10 DIAGNOSIS — M546 Pain in thoracic spine: Secondary | ICD-10-CM | POA: Diagnosis not present

## 2024-02-10 NOTE — Therapy (Signed)
 OUTPATIENT OCCUPATIONAL THERAPY NEURO TREATMENT  Patient Name: Corey [name] MRN: [id number] DOB:09/26/1965, 58 y.o., male Today's Date: 02/10/2024  PCP: Joyce Norleen BROCKS, MD REFERRING PROVIDER: Pegge Toribio PARAS, PA-C  OCCUPATIONAL THERAPY DISCHARGE SUMMARY  Visits from Start of Care: 8  Current functional level related to goals / functional outcomes: See below   Remaining deficits: balance   Education / Equipment: HEP's, safety recommendations   Patient agrees to discharge. Patient goals were met. Patient is being discharged due to being pleased with the current functional level..     END OF SESSION:  OT End of Session - 02/10/24 1023     Visit Number 8    Number of Visits 16   up to 16 visits (anticipate less O.T. needed)   Date for OT Re-Evaluation 03/11/24    Authorization Type BC/BS - VL 50 combined PT/OT    OT Start Time 1021    OT Stop Time 1100    OT Time Calculation (min) 39 min    Activity Tolerance Patient tolerated treatment well    Behavior During Therapy WFL for tasks assessed/performed         Past Medical History:  Diagnosis Date   Arthritis    knee, left   ED (erectile dysfunction)    Hyperlipidemia    Hypertension    Sleep apnea    CPAP - not using lately due to recall    Past Surgical History:  Procedure Laterality Date   ANKLE BONE SURGERY     APPLICATION OF INTRAOPERATIVE CT SCAN  12/07/2023   Procedure: APPLICATION OF INTRAOPERATIVE CT SCAN;  Surgeon: Claudene Penne ORN, MD;  Location: [facility] ORS;  Service: Neurosurgery;;   BACK SURGERY     COLLAR BONE SURGEY     COLONOSCOPY     FRACTURE SURGERY     HERNIA REPAIR     IR IVC FILTER PLMT / S&I /IMG GUID/MOD SED  12/12/2023   KNEE ARTHROSCOPY Left 2006   LAPAROSCOPIC APPENDECTOMY N/A 01/13/2021   Procedure: APPENDECTOMY LAPAROSCOPIC WITH LYSIS OF ADHESIONS;  Surgeon: Sheldon Standing, MD;  Location: [facility];  Service: General;  Laterality: N/A;   LUMBAR MICRODISCECTOMY     POLYPECTOMY      SKIN BIOPSY Left 08/11/2021   nodulocystic fat nercrosis   TOTAL KNEE ARTHROPLASTY Left 11/11/2020   Procedure: LEFT TOTAL KNEE ARTHROPLASTY;  Surgeon: Barbarann Oneil BROCKS, MD;  Location: [facility];  Service: Orthopedics;  Laterality: Left;   UMBILICAL HERNIA REPAIR     Patient Active Problem List   Diagnosis Date Noted   Anxiety 01/12/2024   Chronic low back pain 01/12/2024   Acute embolism and thrombosis of unspecified deep veins of unspecified lower extremity (HCC) 01/12/2024   Coping style affecting medical condition 12/15/2023   Myelopathy concurrent with and due to spinal stenosis of thoracic region [facility]) 12/06/2023   Spinal stenosis 12/06/2023   Thoracic myelopathy 12/06/2023   Morbid obesity (HCC) 12/06/2023   Depression 12/06/2023   Radiculopathy, lumbar region 11/10/2023   Flexion contracture of knee, left 03/01/2023   Prediabetes 11/19/2022   Status post laparoscopic appendectomy 01/14/2021   H/O total knee replacement, left 11/11/2020   Leiomyoma 11/08/2020   Arthritis 09/26/2018   Onychomycosis 09/26/2018   Hyperlipidemia LDL goal <100 08/31/2016   OSA on CPAP 08/31/2016   Premature ejaculation 08/19/2011   ONSET DATE: 12/17/2023 (referral date)  REFERRING DIAG: M47.14 (ICD-10-CM) - Thoracic myelopathy  THERAPY DIAG:  Muscle weakness (generalized)  Unsteadiness on feet  Other  lack of coordination  Rationale for Evaluation and Treatment: Rehabilitation  SUBJECTIVE:   SUBJECTIVE STATEMENT: No reports of pain today just some stiffness in the mid-back and the thighs (Chronic). No recent falls, but some LOB where he caught himself Pt accompanied by: self  PERTINENT HISTORY: s/p emergent T3 transpedicular decompression T1-4 PSF. PMH of back surgery, Lt TKA 2022, hernia repair, HTN, sleep apnea.  PRECAUTIONS: Other: no lifting > 25 lbs   WEIGHT BEARING RESTRICTIONS: No  PAIN:  Are you having pain? No  FALLS: Has patient fallen in last 6 months? Yes. 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  HAND DOMINANCE: Left  ADLs: Overall ADLs: some assist with LE dressing d/t back precautions and no ADL A/E except reacher Transfers/ambulation related to ADLs: Eating: independent Grooming: independent UB Dressing: independent LB Dressing: wife ties shoes, currently not wearing socks (should be wearing compression hoses) Toileting: independent Bathing: mod I  Tub Shower transfers: mod I  Equipment: Shower seat with back  IADLs: Shopping: wife doing currently Light housekeeping: wife doing currently Meal Prep: wife doing currently Community mobility: pt not released to drive yet Medication management: independent Handwriting: denies change  MOBILITY STATUS: Needs Assist: walker, cane in house  UPPER EXTREMITY ROM:  BUE AROM WNL's  UPPER EXTREMITY MMT:   grossly 5/5 BUE's  HAND FUNCTION: Grip strength: Right: 77.6 lbs; Left: 106.2 lbs  COORDINATION: 9 Hole Peg test: Right: 26 sec; Left: 28.38 sec  SENSATION: WFL  EDEMA: none in UE's  COGNITION: Overall cognitive status: Within functional limits for tasks assessed  VISION: Subjective report: no changes Baseline vision: Wears glasses all the time  PERCEPTION: Not tested  PRAXIS: Not tested  OBSERVATIONS: Pt pleasant, walks w/ walker to clinic                                                                                                                           TREATMENT:   Pt's wife  present for today's session Answered questions that pt's wife had re: balance/safety, grip strength, and POC.   Gripper set at level 4 resistance to pick up blocks Rt hand for sustained grip strength w/ min difficulty near end of task d/t fatigue   Pt stood for dynamic standing tasks at counter reaching into lower cabinets and retrieving things from floor - pt ok to do this at home with one hand support on countertop. Practiced w/o countertop support in clinic using gait belt and CGA. Also practiced forward, backwards walking and side  stepping at countertop for negotiation in kitchen.   Walking using cane in Rt hand and carrying cup of water  in Lt hand w/o LOB or spills.   Assessed remaining goals and progress to date. Grip strength improved on Rt - see goal section   Pt asked if he wanted to continue with O.T. for one more week or d/c today. Pt request d/c today.    PATIENT EDUCATION: Education details: see above Person educated: Patient Education method: Explanation, Demonstration, and Verbal cues Education comprehension: verbalized understanding and returned demonstration  HOME EXERCISE PROGRAM: 01/19/2024: putty; coordination; golf solitaire; compression donning 01/27/24: theraband HEP  02/02/2024: sensory precautions; sleep hygiene  GOALS: Goals reviewed with patient? Yes  SHORT TERM GOALS:  MET 3/3 goals   LONG TERM GOALS: Target date: 03/11/24  Pt to perform standing tasks for 25 min w/o rest for light IADLS Baseline:  Goal status: MET  2.  Pt to return to cooking tasks and light cleaning tasks at mod I level Baseline:  02/02/2024: uses air fryer to make chicken; dishes and laundry; now beginning to take out trash Goal status: MET  3.  Pt to increase Rt grip strength by 10 lbs Baseline: 77 lbs (Lt = 106 lbs)  02/02/2024: no improvement Goal status: NOT MET (02/10/24: 84.6 LBS)   4.  Pt to improve Lt hand coordination as evidenced by reducing speed on 9 hole peg test by 3  sec or more Baseline: 28 sec.  02/02/2024: 26 seconds; 21 seconds Goal status: MET   ASSESSMENT:  CLINICAL IMPRESSION: Patient has met all STG's and 3/4 LTG's w/ improved balance. Pt did not met LTG #3 but still improved in grip strength RT hand. Pt has grip strength sufficient for all functional tasks.   SABRA  PERFORMANCE DEFICITS: in functional skills including ADLs, IADLs, coordination, strength, Fine motor control, mobility, balance, body mechanics, endurance, decreased knowledge of precautions, and decreased knowledge of use of DME.   IMPAIRMENTS: are limiting patient from ADLs, IADLs, work, and leisure.   CO-MORBIDITIES: may have co-morbidities  that affects occupational performance. Patient will benefit from skilled OT to address above impairments and improve overall function.  REHAB POTENTIAL: Good  PLAN:  OT FREQUENCY: 1-2x/week  OT DURATION: up to 8 weeks  PLANNED INTERVENTIONS: 97535 self care/ADL training, 02889 therapeutic exercise, 97530 therapeutic activity, 97112 neuromuscular re-education, 97140 manual therapy, 97113 aquatic therapy, 97010 moist heat, balance training, functional mobility training, patient/family education, and DME and/or AE instructions  RECOMMENDED OTHER SERVICES: none at this time  CONSULTED AND AGREED WITH PLAN OF CARE: Patient  PLAN:  D/C O.T.  Burnard JINNY Roads, OT 02/10/2024, 10:24 AM

## 2024-02-10 NOTE — Therapy (Signed)
 OUTPATIENT PHYSICAL THERAPY NEURO TREATMENT   Patient Name: Corey [name] MRN: [id number] DOB:1965/08/20, 58 y.o., male Today's Date: 02/10/2024   PCP: Joyce Norleen BROCKS, MD   REFERRING PROVIDER: Pegge Toribio PARAS, PA-C  END OF SESSION:  PT End of Session - 02/10/24 0931     Visit Number 8    Number of Visits 13    Date for PT Re-Evaluation 03/10/24    Authorization Type [employer]    PT Start Time 0930    PT Stop Time 1012    PT Time Calculation (min) 42 min    Equipment Utilized During Treatment Gait belt    Activity Tolerance Patient tolerated treatment well    Behavior During Therapy WFL for tasks assessed/performed          Past Medical History:  Diagnosis Date   Arthritis    knee, left   ED (erectile dysfunction)    Hyperlipidemia    Hypertension    Sleep apnea    CPAP - not using lately due to recall    Past Surgical History:  Procedure Laterality Date   ANKLE BONE SURGERY     APPLICATION OF INTRAOPERATIVE CT SCAN  12/07/2023   Procedure: APPLICATION OF INTRAOPERATIVE CT SCAN;  Surgeon: Claudene Penne ORN, MD;  Location: [facility] ORS;  Service: Neurosurgery;;   BACK SURGERY     COLLAR BONE SURGEY     COLONOSCOPY     FRACTURE SURGERY     HERNIA REPAIR     IR IVC FILTER PLMT / S&I /IMG GUID/MOD SED  12/12/2023   KNEE ARTHROSCOPY Left 2006   LAPAROSCOPIC APPENDECTOMY N/A 01/13/2021   Procedure: APPENDECTOMY LAPAROSCOPIC WITH LYSIS OF ADHESIONS;  Surgeon: Sheldon Standing, MD;  Location: [facility];  Service: General;  Laterality: N/A;   LUMBAR MICRODISCECTOMY     POLYPECTOMY     SKIN BIOPSY Left 08/11/2021   nodulocystic fat nercrosis   TOTAL KNEE ARTHROPLASTY Left 11/11/2020   Procedure: LEFT TOTAL KNEE ARTHROPLASTY;  Surgeon: Barbarann Oneil BROCKS, MD;  Location: [facility];  Service: Orthopedics;  Laterality: Left;   UMBILICAL HERNIA REPAIR     Patient Active Problem List   Diagnosis Date Noted   Anxiety 01/12/2024   Chronic low back pain 01/12/2024   Acute  embolism and thrombosis of unspecified deep veins of unspecified lower extremity (HCC) 01/12/2024   Coping style affecting medical condition 12/15/2023   Myelopathy concurrent with and due to spinal stenosis of thoracic region [facility]) 12/06/2023   Spinal stenosis 12/06/2023   Thoracic myelopathy 12/06/2023   Morbid obesity (HCC) 12/06/2023   Depression 12/06/2023   Radiculopathy, lumbar region 11/10/2023   Flexion contracture of knee, left 03/01/2023   Prediabetes 11/19/2022   Status post laparoscopic appendectomy 01/14/2021   H/O total knee replacement, left 11/11/2020   Leiomyoma 11/08/2020   Arthritis 09/26/2018   Onychomycosis 09/26/2018   Hyperlipidemia LDL goal <100 08/31/2016   OSA on CPAP 08/31/2016   Premature ejaculation 08/19/2011    ONSET DATE: 12/16/2023  REFERRING DIAG: M47.14 (ICD-10-CM) - Thoracic myelopathy  THERAPY DIAG:  Muscle weakness (generalized)  Unsteadiness on feet  Other abnormalities of gait and mobility  Rationale for Evaluation and Treatment: Rehabilitation  SUBJECTIVE:  SUBJECTIVE STATEMENT: Continuing to have tightness in the back and his legs. Brought his cane into the grocery store and it went well. No falls. Have been hesitant to try the pool due to him being afraid of being too tired after.   Pt accompanied by: self   PERTINENT HISTORY: Status post T3 transpedicular decompression, T1-T4 PSF 12/07/2023 due to severe thoracic myelopathy with progressive ambulatory functional loss per Dr. Penne Sharps neurosurgery.NO BRACE REQUIRED. Continues to have some pain in his mid back up to his neck. He does feel as though the weakness in his legs is improving however he continues to use a rollator. No new weakness, numbness or tingling.Of note his operative course was  complicated after shown to have a DVT postoperatively on day 3. He continues to take Eliquis  for this   Got inpatient rehab and was discharged home 12/20/23  PMH: L knee arthritis, HLD, HTN, L knee replacement 2022  PAIN:  Are you having pain? No, just having some tightness   LUE in sitting: Vitals:   02/10/24 0936  BP: 134/89  Pulse: 97       PRECAUTIONS: Back  Precaution/Restrictions Comments: spinal precautions, no brace needed per orders - spinal precautions lifted at 8/13 visit - can lift up to 25 lbs, can bend and twist to tolerance  FALLS: Has patient fallen in last 6 months? Yes. Number of falls 20, did not injure himself, legs were weak and didn't have strength in his legs  LIVING ENVIRONMENT: Lives with: lives with their spouse Lives in: House/apartment Stairs: Yes: External: 2 steps; none Has following equipment at home: Single point cane, Walker - 2 wheeled, Environmental consultant - 4 wheeled, shower chair, and raised grab bars  PLOF: Independent, Vocation/Vocational requirements: Estate agent , and Leisure: Working out, taking walks   PATIENT GOALS: Wants to get back to being independent, getting his strength back and walking on his own   OBJECTIVE:  Note: Objective measures were completed at Evaluation unless otherwise noted.  COGNITION: Overall cognitive status: Within functional limits for tasks assessed   SENSATION: Light touch: WFL Proprioception: WFL and with ankle DF/PF, noted some bouts of clonus bilat when testing into ankle DF   Pt reporting numbness/tingling in L knee and both feet have numbness/tingling and coldness  Sometimes if he rubs up against something will feel warmth   COORDINATION: Heel to shin: slightly more difficult with LLE    POSTURE: No Significant postural limitations  LOWER EXTREMITY ROM:    Pt with limited L knee extension/flexion ROM due to hx of L knee replacement   LOWER EXTREMITY MMT:    MMT Right Eval Left Eval  Hip  flexion 4+ 4-  Hip extension    Hip abduction 4+ 4+  Hip adduction 5 5  Hip internal rotation    Hip external rotation    Knee flexion 5 4+  Knee extension 4+ 4-  Ankle dorsiflexion 5 5  Ankle plantarflexion    Ankle inversion    Ankle eversion    (Blank rows = not tested)  All tested in sitting   BED MOBILITY:  Pt reporting no difficulties, performing the log roll   TRANSFERS: Sit to stand: SBA  Assistive device utilized: None     Stand to sit: SBA  Assistive device utilized: None      Able to perform with no UE support, wide BOS with LLE staggered behind R  Pt was very pleased with this, did not realize he could perform without  his hands   GAIT: Findings: Gait Characteristics: step through pattern, decreased stride length, and narrow BOS, Distance walked: Clinic distances, Assistive device utilized:Single point cane and Walker - 2 wheeled, Level of assistance: Modified independence, SBA, and CGA, and Comments: mod I with RW, SBA/CGA with SPC   FUNCTIONAL TESTS:  5 times sit to stand: 17.8 seconds with no UE support  Timed up and go (TUG): 16.5 seconds with RW, 20.1 seconds with SPC  10 meter walk test: 17.8 seconds with RW = 1.84 ft/sec                                                                                                                                   TREATMENT DATE: 02/10/24  Therapeutic Activity:  Vitals:   02/10/24 0936  BP: 134/89  Pulse: 97   Floor transfers: Getting to floor with BUE support and CGA (getting into half kneel position, pt using LLE to help him lower and took incr time), once on floor, held tall kneel position for approx 1-2 minutes, attempted tall kneel mini squats, but pt unable to perform so instead performed a gentle hip hinge and then into hip extension for 5 reps with pt more reliant on BUE, pt reporting a cramp in his L hamstrings, so discontinued this position at this time, with pt getting off the floor, needing cues to step  forwards with stronger RLE first in half kneel position and using BUE to help press to stand, performed with min A and took incr time, once in standing needing CGA for balance before pt turning to sit down on mat table. Will benefit from further practice in future sessions  Therapeutic Exercise: Worked on stretches to address buttock/thigh tightness: Supine figure 4 stretch 2 x 30-45 seconds each side, pt with incr tightness on L side  Also showed in seated position too, able to bring RLE over knee, but with LLE crossing over ankle for 30 seconds each  Hip flexor stretch off mat table (modified thomas): 2 x 60 seconds each side, more tightness on L side  Prone quad stretch: 30 seconds RLE, 2 x 30-45 seconds LLE, pt more hypomobile, unable to go fully past 90 degrees   At staircase: 2 x 10 reps forward step ups leading with each leg with no UE support, CGA as needed when performing with LLE, pt reporting RPE as 6/10 At mat table: Holding 12# kettlebell: 10 reps mini squats with 3 second hold at end range, performed an additional 10 reps with adding in a heel raise with mini squat for balance, pt able to maintain balance     PATIENT EDUCATION: Education details: Stretching additions to HEP, can try aquatic HEP and bringing rollator to pool deck and having a family member present and making sure he monitors his fatigue levels and doesn't have to do all of his exercises initially as pt worried about being really tired when he leaves  the pool  Person educated: Patient Education method: Explanation, Demonstration, Verbal cues, and Handouts Education comprehension: verbalized understanding and returned demonstration  HOME EXERCISE PROGRAM:  Access Code: [email or web address] URL: [email or web address] Date: 02/10/2024 Prepared by: Sheffield Senate  Exercises - Supine Hamstring Stretch with Strap  - 1 x daily - 7 x weekly - 3 sets - 10 reps - Supine Heel Slide with Strap  - 1 x daily - 7 x  weekly - 3 sets - 10 reps - Supine 90/90 Alternating Toe Touch  - 1 x daily - 7 x weekly - 3 sets - 10 reps - Dead Bug  - 1 x daily - 7 x weekly - 3 sets - 10 reps - Mini Squats with Walker and Chair  - 1 x daily - 7 x weekly - 4 sets - 10 reps - Side Stepping with Resistance at Ankles and Counter Support  - 1 x daily - 7 x weekly - 3 sets - 10 reps - Forward and Backward Monster Walk with Counter Support  - 1 x daily - 7 x weekly - 3 sets - 10 reps - Supine Figure 4 Piriformis Stretch  - 1 x daily - 7 x weekly - 2 sets - 30 hold - Modified Thomas Stretch  - 1 x daily - 7 x weekly - 3 sets - 60 hold  FOR AQUATIC USE: Access Code: [email or web address] URL: [email or web address] Date: 02/02/2024 Prepared by: Daved Bull  Exercises - Push-Up on Pool Wall  - 1 x daily - 7 x weekly - 3 sets - 10 reps - Forward and Backward Stepping at [employer]  - 1 x daily - 7 x weekly - 3 sets - 10 reps - Asymmetrical Shoulder Flexion Extension with Hand Floats  - 1 x daily - 7 x weekly - 3 sets - 10 reps - Heel Toe Raises at Pool Wall  - 1 x daily - 7 x weekly - 3 sets - 10 reps - Lateral Stepping at Pool Wall  - 1 x daily - 7 x weekly - 3 sets - 10 reps - Standing Hip Circles at [employer]  - 1 x daily - 7 x weekly - 3 sets - 10 reps - Lunge to Target at [employer]  - 1 x daily - 7 x weekly - 3 sets - 10 reps - Plank with Hip Extension at [employer]  - 1 x daily - 7 x weekly - 3 sets - 10 reps - Standing 3-Way Kick with Ankle Float at [employer]  - 1 x daily - 7 x weekly - 3 sets - 10 reps - Sitting Balance on Pool Noodle  - 1 x daily - 7 x weekly - 3 sets - 10 reps  GOALS: Goals reviewed with patient? Yes  SHORT TERM GOALS: Target date: 01/31/2024  Pt will be independent with initial HEP in order to build upon functional gains made in therapy. Baseline:  IND (9/3) Goal status: MET  2.  Pt will improve gait speed with LRAD to at least 2.2 ft/sec in order to demo improved community mobility.    Baseline: 17.8 seconds with RW = 1.84 ft/sec; 2.90 ft/sec w/ rollator (9/3) Goal status: MET  3.  Pt will improve TUG time to 17 seconds or less with SPC in order to demo decrease fall risk.  Baseline: 20.1 seconds with SPC; 16.29 sec w/ SPC SBA (9/3) Goal status: MET   LONG TERM GOALS: Target date: 02/21/2024  Pt will be  independent with initial HEP in order to build upon functional gains made in therapy. Baseline:  Goal status: INITIAL  2. Pt will improve BERG to at least a 46/56 in order to demo decr fall risk.  Baseline: 38/56 Goal status: INITIAL  3.   Pt will improve TUG time to 13.5 seconds or less with SPC/no AD in order to demo decrease fall risk. Baseline:  20.1 seconds with SPC; 16.29 sec w/ SPC SBA (9/3) Goal status: INITIAL  4.  Pt will improve gait speed with LRAD to at least 3.1 ft/sec in order to demo improved community mobility.  Baseline: 17.8 seconds with RW = 1.84 ft/sec; 2.90 ft/sec w/ rollator (9/3) Goal status: REVISED  5.  Pt will improve 5x sit<>stand to less than or equal to 14 sec to demonstrate improved functional strength and transfer efficiency.  Baseline: 17.8 seconds with no UE support Goal status: INITIAL  6.  Pt will ambulate at least 500' outdoors on unlevel surfaces with LRAD in order to demo improved community mobility.  Baseline:  Goal status: INITIAL   ASSESSMENT:  CLINICAL IMPRESSION: Pt reporting continued buttock tightness and hip flexor/quad tightness, so start of session focused on trying different stretches to add to HEP. Pt tolerated well with figure 4 stretch and modified thomas test stretch with reporting feeling relief with pt more hypomobile on L side. Added these to pt's HEP. Work on floor transfers today with getting into half kneel position and using BUE support on mat table with pt needing CGA to lower and min A to come back to standing. Pt unable to tolerate holding a tall kneel position today due to incr cramping in L  hamstring. Will practice again at future sessions. Will continue per POC.   OBJECTIVE IMPAIRMENTS: Abnormal gait, decreased activity tolerance, decreased balance, decreased coordination, decreased mobility, difficulty walking, decreased ROM, decreased strength, impaired flexibility, impaired sensation, postural dysfunction, and pain.   ACTIVITY LIMITATIONS: carrying, lifting, bending, stairs, transfers, and locomotion level  PARTICIPATION LIMITATIONS: shopping, community activity, occupation, and yard work  PERSONAL FACTORS: Behavior pattern, Past/current experiences, Time since onset of injury/illness/exacerbation, and 3+ comorbidities: L knee arthritis, HLD, HTN, L knee replacement 2022 are also affecting patient's functional outcome.   REHAB POTENTIAL: Good  CLINICAL DECISION MAKING: Evolving/moderate complexity  EVALUATION COMPLEXITY: Moderate  PLAN:  PT FREQUENCY: 2x/week  PT DURATION: 8 weeks - anticipate originally 6 weeks   PLANNED INTERVENTIONS: [id number]- PT Re-evaluation, 97110-Therapeutic exercises, 97530- Therapeutic activity, [id number]- Neuromuscular re-education, 97535- Self Care, [id number]- Manual therapy, [phone]- Gait training, [phone]- Aquatic Therapy, Patient/Family education, Balance training, Stair training, and DME instructions  PLAN FOR NEXT SESSION:  Update/revise HEP as appropriate, gait with cane, balance tasks esp SLS and working on decr UE support, BLE strength esp LLE- modified SLS tasks, dynamic stability work, resisted gait/step outs, lunge and squat variations; floor recovery. Try quadruped exercises   Sheffield LOISE Senate, PT, DPT 02/10/2024, 10:15 AM

## 2024-02-15 ENCOUNTER — Encounter: Payer: Self-pay | Admitting: Physical Therapy

## 2024-02-15 ENCOUNTER — Ambulatory Visit: Admitting: Physical Therapy

## 2024-02-15 ENCOUNTER — Encounter: Admitting: Occupational Therapy

## 2024-02-15 VITALS — BP 136/93 | HR 90

## 2024-02-15 DIAGNOSIS — M4714 Other spondylosis with myelopathy, thoracic region: Secondary | ICD-10-CM | POA: Diagnosis not present

## 2024-02-15 DIAGNOSIS — R2689 Other abnormalities of gait and mobility: Secondary | ICD-10-CM

## 2024-02-15 DIAGNOSIS — M6281 Muscle weakness (generalized): Secondary | ICD-10-CM

## 2024-02-15 DIAGNOSIS — R2681 Unsteadiness on feet: Secondary | ICD-10-CM | POA: Diagnosis not present

## 2024-02-15 DIAGNOSIS — R278 Other lack of coordination: Secondary | ICD-10-CM | POA: Diagnosis not present

## 2024-02-15 DIAGNOSIS — M546 Pain in thoracic spine: Secondary | ICD-10-CM | POA: Diagnosis not present

## 2024-02-15 NOTE — Therapy (Signed)
 OUTPATIENT PHYSICAL THERAPY NEURO TREATMENT   Patient Name: Corey [name] MRN: [id number] DOB:1965/06/10, 58 y.o., male Today's Date: 02/15/2024   PCP: Joyce Norleen BROCKS, MD   REFERRING PROVIDER: Pegge Toribio PARAS, PA-C  END OF SESSION:  PT End of Session - 02/15/24 0804     Visit Number 9    Number of Visits 13    Date for PT Re-Evaluation 03/10/24    Authorization Type [employer]    PT Start Time 0803    PT Stop Time 0842    PT Time Calculation (min) 39 min    Equipment Utilized During Treatment Gait belt    Activity Tolerance Patient tolerated treatment well    Behavior During Therapy WFL for tasks assessed/performed          Past Medical History:  Diagnosis Date   Arthritis    knee, left   ED (erectile dysfunction)    Hyperlipidemia    Hypertension    Sleep apnea    CPAP - not using lately due to recall    Past Surgical History:  Procedure Laterality Date   ANKLE BONE SURGERY     APPLICATION OF INTRAOPERATIVE CT SCAN  12/07/2023   Procedure: APPLICATION OF INTRAOPERATIVE CT SCAN;  Surgeon: Claudene Penne ORN, MD;  Location: [facility];  Service: Neurosurgery;;   BACK SURGERY     COLLAR BONE SURGEY     COLONOSCOPY     FRACTURE SURGERY     HERNIA REPAIR     IR IVC FILTER PLMT / S&I /IMG GUID/MOD SED  12/12/2023   KNEE ARTHROSCOPY Left 2006   LAPAROSCOPIC APPENDECTOMY N/A 01/13/2021   Procedure: APPENDECTOMY LAPAROSCOPIC WITH LYSIS OF ADHESIONS;  Surgeon: Sheldon Standing, MD;  Location: [facility];  Service: General;  Laterality: N/A;   LUMBAR MICRODISCECTOMY     POLYPECTOMY     SKIN BIOPSY Left 08/11/2021   nodulocystic fat nercrosis   TOTAL KNEE ARTHROPLASTY Left 11/11/2020   Procedure: LEFT TOTAL KNEE ARTHROPLASTY;  Surgeon: Barbarann Oneil BROCKS, MD;  Location: [facility];  Service: Orthopedics;  Laterality: Left;   UMBILICAL HERNIA REPAIR     Patient Active Problem List   Diagnosis Date Noted   Anxiety 01/12/2024   Chronic low back pain 01/12/2024   Acute  embolism and thrombosis of unspecified deep veins of unspecified lower extremity (HCC) 01/12/2024   Coping style affecting medical condition 12/15/2023   Myelopathy concurrent with and due to spinal stenosis of thoracic region [facility]) 12/06/2023   Spinal stenosis 12/06/2023   Thoracic myelopathy 12/06/2023   Morbid obesity (HCC) 12/06/2023   Depression 12/06/2023   Radiculopathy, lumbar region 11/10/2023   Flexion contracture of knee, left 03/01/2023   Prediabetes 11/19/2022   Status post laparoscopic appendectomy 01/14/2021   H/O total knee replacement, left 11/11/2020   Leiomyoma 11/08/2020   Arthritis 09/26/2018   Onychomycosis 09/26/2018   Hyperlipidemia LDL goal <100 08/31/2016   OSA on CPAP 08/31/2016   Premature ejaculation 08/19/2011    ONSET DATE: 12/16/2023  REFERRING DIAG: M47.14 (ICD-10-CM) - Thoracic myelopathy  THERAPY DIAG:  Muscle weakness (generalized)  Unsteadiness on feet  Other abnormalities of gait and mobility  Rationale for Evaluation and Treatment: Rehabilitation  SUBJECTIVE:  SUBJECTIVE STATEMENT: Only bringing his rollator in to therapy. Otherwise using his cane. Graduated from OT. No falls. Stretches are going well, feels like they are helping. Knees still get weak with prolonged sitting.   Pt accompanied by: self   PERTINENT HISTORY: Status post T3 transpedicular decompression, T1-T4 PSF 12/07/2023 due to severe thoracic myelopathy with progressive ambulatory functional loss per Dr. Penne Sharps neurosurgery.NO BRACE REQUIRED. Continues to have some pain in his mid back up to his neck. He does feel as though the weakness in his legs is improving however he continues to use a rollator. No new weakness, numbness or tingling.Of note his operative course was complicated  after shown to have a DVT postoperatively on day 3. He continues to take Eliquis  for this   Got inpatient rehab and was discharged home 12/20/23  PMH: L knee arthritis, HLD, HTN, L knee replacement 2022  PAIN:  Are you having pain? No, just having some tightness   LUE in sitting: Vitals:   02/15/24 0808  BP: (!) 136/93  Pulse: 90     PRECAUTIONS: Back  Precaution/Restrictions Comments: spinal precautions, no brace needed per orders - spinal precautions lifted at 8/13 visit - can lift up to 25 lbs, can bend and twist to tolerance  FALLS: Has patient fallen in last 6 months? Yes. Number of falls 20, did not injure himself, legs were weak and didn't have strength in his legs  LIVING ENVIRONMENT: Lives with: lives with their spouse Lives in: House/apartment Stairs: Yes: External: 2 steps; none Has following equipment at home: Single point cane, Walker - 2 wheeled, Environmental consultant - 4 wheeled, shower chair, and raised grab bars  PLOF: Independent, Vocation/Vocational requirements: Estate agent , and Leisure: Working out, taking walks   PATIENT GOALS: Wants to get back to being independent, getting his strength back and walking on his own   OBJECTIVE:  Note: Objective measures were completed at Evaluation unless otherwise noted.  COGNITION: Overall cognitive status: Within functional limits for tasks assessed   SENSATION: Light touch: WFL Proprioception: WFL and with ankle DF/PF, noted some bouts of clonus bilat when testing into ankle DF   Pt reporting numbness/tingling in L knee and both feet have numbness/tingling and coldness  Sometimes if he rubs up against something will feel warmth   COORDINATION: Heel to shin: slightly more difficult with LLE    POSTURE: No Significant postural limitations  LOWER EXTREMITY ROM:    Pt with limited L knee extension/flexion ROM due to hx of L knee replacement   LOWER EXTREMITY MMT:    MMT Right Eval Left Eval  Hip flexion 4+ 4-   Hip extension    Hip abduction 4+ 4+  Hip adduction 5 5  Hip internal rotation    Hip external rotation    Knee flexion 5 4+  Knee extension 4+ 4-  Ankle dorsiflexion 5 5  Ankle plantarflexion    Ankle inversion    Ankle eversion    (Blank rows = not tested)  All tested in sitting   BED MOBILITY:  Pt reporting no difficulties, performing the log roll   TRANSFERS: Sit to stand: SBA  Assistive device utilized: None     Stand to sit: SBA  Assistive device utilized: None      Able to perform with no UE support, wide BOS with LLE staggered behind R  Pt was very pleased with this, did not realize he could perform without his hands   GAIT: Findings: Gait Characteristics: step  through pattern, decreased stride length, and narrow BOS, Distance walked: Clinic distances, Assistive device utilized:Single point cane and Walker - 2 wheeled, Level of assistance: Modified independence, SBA, and CGA, and Comments: mod I with RW, SBA/CGA with SPC   FUNCTIONAL TESTS:  5 times sit to stand: 17.8 seconds with no UE support  Timed up and go (TUG): 16.5 seconds with RW, 20.1 seconds with SPC  10 meter walk test: 17.8 seconds with RW = 1.84 ft/sec                                                                                                                                   TREATMENT DATE: 02/15/24  Therapeutic Activity:  Vitals:   02/15/24 0808  BP: (!) 136/93  Pulse: 90  Assessed BP and diastolic elevated, but WFL to participate in therapy. Discussed monitoring at home as diastolic has been in the low 90s previously at therapy. Pt reporting that he has a BP cuff at home and can monitor   GAIT: Gait pattern: step through pattern, lateral lean- Right, and narrow BOS Distance walked: 500' outdoors on pavement  Assistive device utilized: Single point cane  Level of assistance: SBA/mod I Comments: Ambulated outdoors on pavement with SPC with pt having no issues and no knee instability. Pt  with good sequencing throughout. Performed a curb x1 with SPC with pt able to perform with proper technique. Pt asking about bringing cane in to therapy session, discussed he would be cleared to use his cane as pt using it for all other mobility out in the community.   NMR: On blue foam beam: Side stepping down and back and alternating SLS taps to cones (5 cones total), performed 4 reps, CGA as needed for balance and pt intermittently needing to use UE support, improved with incr reps with control and balance  On blue mat for compliant surface with 3 taller orange obstacles: step to pattern leading with each leg 5 times for SLS, foot clearance, beginning with UE support > none, cued for incr hip and knee flexion rather than circumduction   Therapeutic Exercise: Single leg bridging: RLE 10 reps, LLE 2 sets of 5 reps (initial incr difficulty performing, needing a brief rest break between each set) Sidelying hip ABD 2 sets of 10 reps each side, initial cues for technique and alignment, pt fatigues more with LLE, discussed at home can perform in 2 sets of 5 reps   PATIENT EDUCATION: Education details: Added further hip strengthening to HEP, cleared to bring cane to therapy sessions  Person educated: Patient Education method: Explanation, Demonstration, Verbal cues, and Handouts Education comprehension: verbalized understanding and returned demonstration  HOME EXERCISE PROGRAM:  Access Code: [email or web address] URL: [email or web address] Date: 02/15/2024 Prepared by: Sheffield Senate  Exercises - Supine Hamstring Stretch with Strap  - 1 x daily - 7 x weekly - 3 sets - 10 reps - Supine Heel Slide with Strap  -  1 x daily - 7 x weekly - 3 sets - 10 reps - Supine 90/90 Alternating Toe Touch  - 1 x daily - 7 x weekly - 3 sets - 10 reps - Dead Bug  - 1 x daily - 7 x weekly - 3 sets - 10 reps - Mini Squats with Walker and Chair  - 1 x daily - 7 x weekly - 4 sets - 10 reps - Side Stepping with  Resistance at Ankles and Counter Support  - 1 x daily - 7 x weekly - 3 sets - 10 reps - Forward and Backward Monster Walk with Counter Support  - 1 x daily - 7 x weekly - 3 sets - 10 reps - Supine Figure 4 Piriformis Stretch  - 1 x daily - 7 x weekly - 2 sets - 30 hold - Modified Thomas Stretch  - 1 x daily - 7 x weekly - 3 sets - 60 hold - Sidelying Hip Abduction  - 1 x daily - 5 x weekly - 2 sets - 10 reps - Single Leg Bridge  - 1 x daily - 5 x weekly - 1 sets - 10 reps  FOR AQUATIC USE: Access Code: [email or web address] URL: [email or web address] Date: 02/02/2024 Prepared by: Daved Bull  Exercises - Push-Up on Pool Wall  - 1 x daily - 7 x weekly - 3 sets - 10 reps - Forward and Backward Stepping at [employer]  - 1 x daily - 7 x weekly - 3 sets - 10 reps - Asymmetrical Shoulder Flexion Extension with Hand Floats  - 1 x daily - 7 x weekly - 3 sets - 10 reps - Heel Toe Raises at Pool Wall  - 1 x daily - 7 x weekly - 3 sets - 10 reps - Lateral Stepping at Pool Wall  - 1 x daily - 7 x weekly - 3 sets - 10 reps - Standing Hip Circles at [employer]  - 1 x daily - 7 x weekly - 3 sets - 10 reps - Lunge to Target at [employer]  - 1 x daily - 7 x weekly - 3 sets - 10 reps - Plank with Hip Extension at [employer]  - 1 x daily - 7 x weekly - 3 sets - 10 reps - Standing 3-Way Kick with Ankle Float at [employer]  - 1 x daily - 7 x weekly - 3 sets - 10 reps - Sitting Balance on Pool Noodle  - 1 x daily - 7 x weekly - 3 sets - 10 reps  GOALS: Goals reviewed with patient? Yes  SHORT TERM GOALS: Target date: 01/31/2024  Pt will be independent with initial HEP in order to build upon functional gains made in therapy. Baseline:  IND (9/3) Goal status: MET  2.  Pt will improve gait speed with LRAD to at least 2.2 ft/sec in order to demo improved community mobility.   Baseline: 17.8 seconds with RW = 1.84 ft/sec; 2.90 ft/sec w/ rollator (9/3) Goal status: MET  3.  Pt will improve TUG time to 17  seconds or less with SPC in order to demo decrease fall risk.  Baseline: 20.1 seconds with SPC; 16.29 sec w/ SPC SBA (9/3) Goal status: MET   LONG TERM GOALS: Target date: 02/21/2024  Pt will be independent with initial HEP in order to build upon functional gains made in therapy. Baseline:  Goal status: INITIAL  2. Pt will improve BERG to at least a 46/56  in order to demo decr fall risk.  Baseline: 38/56 Goal status: INITIAL  3.   Pt will improve TUG time to 13.5 seconds or less with SPC/no AD in order to demo decrease fall risk. Baseline:  20.1 seconds with SPC; 16.29 sec w/ SPC SBA (9/3) Goal status: INITIAL  4.  Pt will improve gait speed with LRAD to at least 3.1 ft/sec in order to demo improved community mobility.  Baseline: 17.8 seconds with RW = 1.84 ft/sec; 2.90 ft/sec w/ rollator (9/3) Goal status: REVISED  5.  Pt will improve 5x sit<>stand to less than or equal to 14 sec to demonstrate improved functional strength and transfer efficiency.  Baseline: 17.8 seconds with no UE support Goal status: INITIAL  6.  Pt will ambulate at least 500' outdoors on unlevel surfaces with LRAD in order to demo improved community mobility.  Baseline:  Goal status: INITIAL   ASSESSMENT:  CLINICAL IMPRESSION: Today's skilled session focused on community distance gait with SPC with pt able to ambulate out on pavement with SBA/mod I and no LOB. Also performed curb with cane with supervision. Discussed pt is cleared to bring his [facility] in to therapy sessions as pt currently bringing in his rollator, but using [facility] for all other community distances. Remainder of session focused on balance tasks on compliant surfaces with SLS and hip strengthening exercises on the mat table. With LLE, pt fatigues easily and has more difficulty performing single leg bridges. Added these and sidelying hip ABD to HEP. Will continue per POC.   OBJECTIVE IMPAIRMENTS: Abnormal gait, decreased activity tolerance, decreased  balance, decreased coordination, decreased mobility, difficulty walking, decreased ROM, decreased strength, impaired flexibility, impaired sensation, postural dysfunction, and pain.   ACTIVITY LIMITATIONS: carrying, lifting, bending, stairs, transfers, and locomotion level  PARTICIPATION LIMITATIONS: shopping, community activity, occupation, and yard work  PERSONAL FACTORS: Behavior pattern, Past/current experiences, Time since onset of injury/illness/exacerbation, and 3+ comorbidities: L knee arthritis, HLD, HTN, L knee replacement 2022 are also affecting patient's functional outcome.   REHAB POTENTIAL: Good  CLINICAL DECISION MAKING: Evolving/moderate complexity  EVALUATION COMPLEXITY: Moderate  PLAN:  PT FREQUENCY: 2x/week  PT DURATION: 8 weeks - anticipate originally 6 weeks   PLANNED INTERVENTIONS: [id number]- PT Re-evaluation, 97110-Therapeutic exercises, 97530- Therapeutic activity, [id number]- Neuromuscular re-education, 97535- Self Care, [id number]- Manual therapy, [phone]- Gait training, [phone]- Aquatic Therapy, Patient/Family education, Balance training, Stair training, and DME instructions  PLAN FOR NEXT SESSION: CHECK LTGS NEXT WEEK   gait with cane, balance tasks esp SLS and working on decr UE support, BLE strength esp LLE- modified SLS tasks, dynamic stability work, resisted gait/step outs, lunge and squat variations; floor recovery. Try quadruped exercises   Sheffield LOISE Senate, PT, DPT 02/15/2024, 8:46 AM

## 2024-02-17 ENCOUNTER — Ambulatory Visit: Admitting: Physical Therapy

## 2024-02-17 ENCOUNTER — Encounter: Payer: Self-pay | Admitting: Physical Therapy

## 2024-02-17 ENCOUNTER — Encounter: Admitting: Occupational Therapy

## 2024-02-17 VITALS — BP 122/90 | HR 126

## 2024-02-17 DIAGNOSIS — R2681 Unsteadiness on feet: Secondary | ICD-10-CM | POA: Diagnosis not present

## 2024-02-17 DIAGNOSIS — M4714 Other spondylosis with myelopathy, thoracic region: Secondary | ICD-10-CM | POA: Diagnosis not present

## 2024-02-17 DIAGNOSIS — M6281 Muscle weakness (generalized): Secondary | ICD-10-CM

## 2024-02-17 DIAGNOSIS — R2689 Other abnormalities of gait and mobility: Secondary | ICD-10-CM

## 2024-02-17 DIAGNOSIS — R278 Other lack of coordination: Secondary | ICD-10-CM | POA: Diagnosis not present

## 2024-02-17 DIAGNOSIS — M546 Pain in thoracic spine: Secondary | ICD-10-CM | POA: Diagnosis not present

## 2024-02-17 NOTE — Therapy (Signed)
 OUTPATIENT PHYSICAL THERAPY NEURO TREATMENT   Patient Name: Corey [name] MRN: [id number] DOB:[birth year], 58 y.o., male Today's Date: 02/17/2024   PCP: Joyce Norleen BROCKS, MD REFERRING PROVIDER: Pegge Toribio PARAS, PA-C  END OF SESSION:  PT End of Session - 02/17/24 0849     Visit Number 10    Number of Visits 13    Date for Recertification  03/10/24    Authorization Type [employer]    PT Start Time 0847    PT Stop Time 0928    PT Time Calculation (min) 41 min    Equipment Utilized During Treatment Gait belt    Activity Tolerance Patient tolerated treatment well    Behavior During Therapy WFL for tasks assessed/performed          Past Medical History:  Diagnosis Date   Arthritis    knee, left   ED (erectile dysfunction)    Hyperlipidemia    Hypertension    Sleep apnea    CPAP - not using lately due to recall    Past Surgical History:  Procedure Laterality Date   ANKLE BONE SURGERY     APPLICATION OF INTRAOPERATIVE CT SCAN  12/07/2023   Procedure: APPLICATION OF INTRAOPERATIVE CT SCAN;  Surgeon: Claudene Penne ORN, MD;  Location: [facility] OR[facility];  Service: Neurosurgery;;   BACK SURGERY     COLLAR BONE SURGEY     COLONOSCOPY     FRACTURE SURGERY     HERNIA REPAIR     IR IVC FILTER PLMT / S&I /IMG GUID/MOD SED  12/12/2023   KNEE ARTHROSCOPY Left 2006   LAPAROSCOPIC APPENDECTOMY N/A 01/13/2021   Procedure: APPENDECTOMY LAPAROSCOPIC WITH LYSIS OF ADHESIONS;  Surgeon: Sheldon Standing, MD;  Location: [facility];  Service: General;  Laterality: N/A;   LUMBAR MICRODISCECTOMY     POLYPECTOMY     SKIN BIOPSY Left 08/11/2021   nodulocystic fat nercrosis   TOTAL KNEE ARTHROPLASTY Left 11/11/2020   Procedure: LEFT TOTAL KNEE ARTHROPLASTY;  Surgeon: Barbarann Oneil BROCKS, MD;  Location: [facility];  Service: Orthopedics;  Laterality: Left;   UMBILICAL HERNIA REPAIR     Patient Active Problem List   Diagnosis Date Noted   Anxiety 01/12/2024   Chronic low back pain 01/12/2024   Acute  embolism and thrombosis of unspecified deep veins of unspecified lower extremity (HCC) 01/12/2024   Coping style affecting medical condition 12/15/2023   Myelopathy concurrent with and due to spinal stenosis of thoracic region [facility]) 12/06/2023   Spinal stenosis 12/06/2023   Thoracic myelopathy 12/06/2023   Morbid obesity (HCC) 12/06/2023   Depression 12/06/2023   Radiculopathy, lumbar region 11/10/2023   Flexion contracture of knee, left 03/01/2023   Prediabetes 11/19/2022   Status post laparoscopic appendectomy 01/14/2021   H/O total knee replacement, left 11/11/2020   Leiomyoma 11/08/2020   Arthritis 09/26/2018   Onychomycosis 09/26/2018   Hyperlipidemia LDL goal <100 08/31/2016   OSA on CPAP 08/31/2016   Premature ejaculation 08/19/2011    ONSET DATE: 12/16/2023  REFERRING DIAG: M47.14 (ICD-10-CM) - Thoracic myelopathy  THERAPY DIAG:  Muscle weakness (generalized)  Unsteadiness on feet  Other abnormalities of gait and mobility  Rationale for Evaluation and Treatment: Rehabilitation  SUBJECTIVE:  SUBJECTIVE STATEMENT: Brought in his cane today. Had to take a few pain pills the other night. Notes his leg feels heavy with the hip strengthening exercises. Notes the tightness doesn't go away.   Pt accompanied by: self   PERTINENT HISTORY: Status post T3 transpedicular decompression, T1-T4 PSF 12/07/2023 due to severe thoracic myelopathy with progressive ambulatory functional loss per Dr. Penne Sharps neurosurgery.NO BRACE REQUIRED. Continues to have some pain in his mid back up to his neck. He does feel as though the weakness in his legs is improving however he continues to use a rollator. No new weakness, numbness or tingling.Of note his operative course was complicated after shown to have a DVT  postoperatively on day 3. He continues to take Eliquis  for this   Got inpatient rehab and was discharged home 12/20/23  PMH: L knee arthritis, HLD, HTN, L knee replacement 2022  PAIN:  Are you having pain? No, just having some tightness   LUE in sitting: Vitals:   02/17/24 0853 02/17/24 0918  BP: (!) 132/93 (!) 122/90  Pulse: (!) 111 (!) 126      PRECAUTIONS: Back  Precaution/Restrictions Comments: spinal precautions, no brace needed per orders - spinal precautions lifted at 8/13 visit - can lift up to 25 lbs, can bend and twist to tolerance  FALLS: Has patient fallen in last 6 months? Yes. Number of falls 20, did not injure himself, legs were weak and didn't have strength in his legs  LIVING ENVIRONMENT: Lives with: lives with their spouse Lives in: House/apartment Stairs: Yes: External: 2 steps; none Has following equipment at home: Single point cane, Walker - 2 wheeled, Environmental consultant - 4 wheeled, shower chair, and raised grab bars  PLOF: Independent, Vocation/Vocational requirements: Estate agent , and Leisure: Working out, taking walks   PATIENT GOALS: Wants to get back to being independent, getting his strength back and walking on his own   OBJECTIVE:  Note: Objective measures were completed at Evaluation unless otherwise noted.  COGNITION: Overall cognitive status: Within functional limits for tasks assessed   SENSATION: Light touch: WFL Proprioception: WFL and with ankle DF/PF, noted some bouts of clonus bilat when testing into ankle DF   Pt reporting numbness/tingling in L knee and both feet have numbness/tingling and coldness  Sometimes if he rubs up against something will feel warmth   COORDINATION: Heel to shin: slightly more difficult with LLE    POSTURE: No Significant postural limitations  LOWER EXTREMITY ROM:    Pt with limited L knee extension/flexion ROM due to hx of L knee replacement   LOWER EXTREMITY MMT:    MMT Right Eval Left Eval  Hip  flexion 4+ 4-  Hip extension    Hip abduction 4+ 4+  Hip adduction 5 5  Hip internal rotation    Hip external rotation    Knee flexion 5 4+  Knee extension 4+ 4-  Ankle dorsiflexion 5 5  Ankle plantarflexion    Ankle inversion    Ankle eversion    (Blank rows = not tested)  All tested in sitting   BED MOBILITY:  Pt reporting no difficulties, performing the log roll   TRANSFERS: Sit to stand: SBA  Assistive device utilized: None     Stand to sit: SBA  Assistive device utilized: None      Able to perform with no UE support, wide BOS with LLE staggered behind R  Pt was very pleased with this, did not realize he could perform without his hands  GAIT: Findings: Gait Characteristics: step through pattern, decreased stride length, and narrow BOS, Distance walked: Clinic distances, Assistive device utilized:Single point cane and Walker - 2 wheeled, Level of assistance: Modified independence, SBA, and CGA, and Comments: mod I with RW, SBA/CGA with SPC   FUNCTIONAL TESTS:  5 times sit to stand: 17.8 seconds with no UE support  Timed up and go (TUG): 16.5 seconds with RW, 20.1 seconds with SPC  10 meter walk test: 17.8 seconds with RW = 1.84 ft/sec                                                                                                                                  TREATMENT DATE: 02/17/24  Self-Care: Vitals:   02/17/24 0853 02/17/24 0918  BP: (!) 132/93 (!) 122/90  Pulse: (!) 111 (!) 126   Assessed BP and diastolic elevated, but WFL to participate in therapy. Discussed monitoring at home and keeping a BP log (printed one out for pt) and to discuss with PCP. Pt's HR also elevated at rest at start of session and monitored with activity.   Therapeutic Exercise: Prone knee flexion for hamstring strengthening: LLE; 5 reps no resistance, 10 reps with black resistance band with therapist giving resistance (decr AROM with LLE), repeated 10 reps with RLE with pt with  improved ROM on this side  In quadruped: Alternating straight leg hip extension 2 x 10 reps each side, cues for neutral pelvis alignment and trying to fully extend leg, needed seated rest break  Fire hydrants for hip ABD 10 reps each side, pt with difficulty with shifting weight to L side  Bridging with hip ADD ball squeeze 10 reps, plus an additional 10 reps with bilateral feet on gum drops for incr core activation, pt more challenged with adding unlevel gum drops  Seated on blue physioball for core activation and balance: Alternating marching 2 x 10 reps, then performed marching with alt UE lift 10 reps Alternating LAQs x10 reps, added holding 3# ball and performing chest press when kicking leg out 10 reps  Intermittent CGA for balance   PATIENT EDUCATION: Education details: monitoring BP at home and provided BP log  Person educated: Patient and Spouse Education method: Explanation, Demonstration, Verbal cues, and Handouts Education comprehension: verbalized understanding and returned demonstration  HOME EXERCISE PROGRAM:  Access Code: [email or web address] URL: [email or web address] Date: 02/15/2024 Prepared by: Sheffield Senate  Exercises - Supine Hamstring Stretch with Strap  - 1 x daily - 7 x weekly - 3 sets - 10 reps - Supine Heel Slide with Strap  - 1 x daily - 7 x weekly - 3 sets - 10 reps - Supine 90/90 Alternating Toe Touch  - 1 x daily - 7 x weekly - 3 sets - 10 reps - Dead Bug  - 1 x daily - 7 x weekly - 3 sets - 10 reps - Mini Squats with Environmental consultant and Chair  - 1  x daily - 7 x weekly - 4 sets - 10 reps - Side Stepping with Resistance at Ankles and Counter Support  - 1 x daily - 7 x weekly - 3 sets - 10 reps - Forward and Backward Monster Walk with Counter Support  - 1 x daily - 7 x weekly - 3 sets - 10 reps - Supine Figure 4 Piriformis Stretch  - 1 x daily - 7 x weekly - 2 sets - 30 hold - Modified Thomas Stretch  - 1 x daily - 7 x weekly - 3 sets - 60 hold - Sidelying Hip  Abduction  - 1 x daily - 5 x weekly - 2 sets - 10 reps - Single Leg Bridge  - 1 x daily - 5 x weekly - 1 sets - 10 reps  FOR AQUATIC USE: Access Code: [email or web address] URL: [email or web address] Date: 02/02/2024 Prepared by: Daved Bull  Exercises - Push-Up on Pool Wall  - 1 x daily - 7 x weekly - 3 sets - 10 reps - Forward and Backward Stepping at [employer]  - 1 x daily - 7 x weekly - 3 sets - 10 reps - Asymmetrical Shoulder Flexion Extension with Hand Floats  - 1 x daily - 7 x weekly - 3 sets - 10 reps - Heel Toe Raises at Pool Wall  - 1 x daily - 7 x weekly - 3 sets - 10 reps - Lateral Stepping at Pool Wall  - 1 x daily - 7 x weekly - 3 sets - 10 reps - Standing Hip Circles at [employer]  - 1 x daily - 7 x weekly - 3 sets - 10 reps - Lunge to Target at [employer]  - 1 x daily - 7 x weekly - 3 sets - 10 reps - Plank with Hip Extension at [employer]  - 1 x daily - 7 x weekly - 3 sets - 10 reps - Standing 3-Way Kick with Ankle Float at [employer]  - 1 x daily - 7 x weekly - 3 sets - 10 reps - Sitting Balance on Pool Noodle  - 1 x daily - 7 x weekly - 3 sets - 10 reps  GOALS: Goals reviewed with patient? Yes  SHORT TERM GOALS: Target date: 01/31/2024  Pt will be independent with initial HEP in order to build upon functional gains made in therapy. Baseline:  IND (9/3) Goal status: MET  2.  Pt will improve gait speed with LRAD to at least 2.2 ft/sec in order to demo improved community mobility.   Baseline: 17.8 seconds with RW = 1.84 ft/sec; 2.90 ft/sec w/ rollator (9/3) Goal status: MET  3.  Pt will improve TUG time to 17 seconds or less with SPC in order to demo decrease fall risk.  Baseline: 20.1 seconds with SPC; 16.29 sec w/ SPC SBA (9/3) Goal status: MET   LONG TERM GOALS: Target date: 02/21/2024  Pt will be independent with initial HEP in order to build upon functional gains made in therapy. Baseline:  Goal status: INITIAL  2. Pt will improve BERG to at least a  46/56 in order to demo decr fall risk.  Baseline: 38/56 Goal status: INITIAL  3.   Pt will improve TUG time to 13.5 seconds or less with SPC/no AD in order to demo decrease fall risk. Baseline:  20.1 seconds with SPC; 16.29 sec w/ SPC SBA (9/3) Goal status: INITIAL  4.  Pt will improve gait speed with LRAD  to at least 3.1 ft/sec in order to demo improved community mobility.  Baseline: 17.8 seconds with RW = 1.84 ft/sec; 2.90 ft/sec w/ rollator (9/3) Goal status: REVISED  5.  Pt will improve 5x sit<>stand to less than or equal to 14 sec to demonstrate improved functional strength and transfer efficiency.  Baseline: 17.8 seconds with no UE support Goal status: INITIAL  6.  Pt will ambulate at least 500' outdoors on unlevel surfaces with LRAD in order to demo improved community mobility.  Baseline:  Goal status: INITIAL   ASSESSMENT:  CLINICAL IMPRESSION: Pt's BP and HR elevated at start of session (see above), but still WFL to participate in therapy and pt asymptomatic. Pt's diastolic BP has been in the 90s recently, so continued to discuss about monitoring at home and provided BP log. Remainder of session focused on BLE strengthening on mat table/quadruped and core exercises in quadruped and seated on physioball. Pt more challenged with exercises involving weight shifting to the L side and pt fatigues easily, needing intermittent rest breaks. Will continue per POC.   OBJECTIVE IMPAIRMENTS: Abnormal gait, decreased activity tolerance, decreased balance, decreased coordination, decreased mobility, difficulty walking, decreased ROM, decreased strength, impaired flexibility, impaired sensation, postural dysfunction, and pain.   ACTIVITY LIMITATIONS: carrying, lifting, bending, stairs, transfers, and locomotion level  PARTICIPATION LIMITATIONS: shopping, community activity, occupation, and yard work  PERSONAL FACTORS: Behavior pattern, Past/current experiences, Time since onset of  injury/illness/exacerbation, and 3+ comorbidities: L knee arthritis, HLD, HTN, L knee replacement 2022 are also affecting patient's functional outcome.   REHAB POTENTIAL: Good  CLINICAL DECISION MAKING: Evolving/moderate complexity  EVALUATION COMPLEXITY: Moderate  PLAN:  PT FREQUENCY: 2x/week  PT DURATION: 8 weeks - anticipate originally 6 weeks   PLANNED INTERVENTIONS: [id number]- PT Re-evaluation, 97110-Therapeutic exercises, 97530- Therapeutic activity, [id number]- Neuromuscular re-education, 97535- Self Care, [id number]- Manual therapy, [phone]- Gait training, [phone]- Aquatic Therapy, Patient/Family education, Balance training, Stair training, and DME instructions  PLAN FOR NEXT SESSION: CHECK LTGS NEXT WEEK and do re-cert, potentially add in aquatic PT to POC?   gait with cane, BLE strength esp LLE- SLS tasks, dynamic stability work, resisted gait/step outs, lunge and squat variations; floor recovery. Try quadruped exercises   Sheffield LOISE Senate, PT, DPT 02/17/2024, 10:29 AM

## 2024-02-22 ENCOUNTER — Encounter: Admitting: Occupational Therapy

## 2024-02-22 ENCOUNTER — Ambulatory Visit: Admitting: Physical Therapy

## 2024-02-22 ENCOUNTER — Encounter: Payer: Self-pay | Admitting: Physical Therapy

## 2024-02-22 VITALS — BP 138/69 | HR 90

## 2024-02-22 DIAGNOSIS — M6281 Muscle weakness (generalized): Secondary | ICD-10-CM

## 2024-02-22 DIAGNOSIS — R2681 Unsteadiness on feet: Secondary | ICD-10-CM

## 2024-02-22 DIAGNOSIS — R2689 Other abnormalities of gait and mobility: Secondary | ICD-10-CM | POA: Diagnosis not present

## 2024-02-22 DIAGNOSIS — M4714 Other spondylosis with myelopathy, thoracic region: Secondary | ICD-10-CM | POA: Diagnosis not present

## 2024-02-22 DIAGNOSIS — M546 Pain in thoracic spine: Secondary | ICD-10-CM

## 2024-02-22 DIAGNOSIS — R278 Other lack of coordination: Secondary | ICD-10-CM | POA: Diagnosis not present

## 2024-02-22 NOTE — Therapy (Signed)
 OUTPATIENT PHYSICAL THERAPY NEURO TREATMENT   Patient Name: Corey [name] MRN: [id number] DOB:Jul 03, 1965, 58 y.o., male Today's Date: 02/22/2024   PCP: Joyce Norleen BROCKS, MD REFERRING PROVIDER: Pegge Toribio PARAS, PA-C  END OF SESSION:  PT End of Session - 02/22/24 0902     Visit Number 11    Number of Visits 13    Date for Recertification  03/10/24    Authorization Type [employer]    PT Start Time [phone]   pt checked in late   PT Stop Time 0935    PT Time Calculation (min) 43 min    Equipment Utilized During Treatment Gait belt    Activity Tolerance Patient tolerated treatment well    Behavior During Therapy WFL for tasks assessed/performed          Past Medical History:  Diagnosis Date   Arthritis    knee, left   ED (erectile dysfunction)    Hyperlipidemia    Hypertension    Sleep apnea    CPAP - not using lately due to recall    Past Surgical History:  Procedure Laterality Date   ANKLE BONE SURGERY     APPLICATION OF INTRAOPERATIVE CT SCAN  12/07/2023   Procedure: APPLICATION OF INTRAOPERATIVE CT SCAN;  Surgeon: Claudene Penne ORN, MD;  Location: [facility];  Service: Neurosurgery;;   BACK SURGERY     COLLAR BONE SURGEY     COLONOSCOPY     FRACTURE SURGERY     HERNIA REPAIR     IR IVC FILTER PLMT / S&I /IMG GUID/MOD SED  12/12/2023   KNEE ARTHROSCOPY Left 2006   LAPAROSCOPIC APPENDECTOMY N/A 01/13/2021   Procedure: APPENDECTOMY LAPAROSCOPIC WITH LYSIS OF ADHESIONS;  Surgeon: Sheldon Standing, MD;  Location: [facility];  Service: General;  Laterality: N/A;   LUMBAR MICRODISCECTOMY     POLYPECTOMY     SKIN BIOPSY Left 08/11/2021   nodulocystic fat nercrosis   TOTAL KNEE ARTHROPLASTY Left 11/11/2020   Procedure: LEFT TOTAL KNEE ARTHROPLASTY;  Surgeon: Barbarann Oneil BROCKS, MD;  Location: [facility];  Service: Orthopedics;  Laterality: Left;   UMBILICAL HERNIA REPAIR     Patient Active Problem List   Diagnosis Date Noted   Anxiety 01/12/2024   Chronic low back pain  01/12/2024   Acute embolism and thrombosis of unspecified deep veins of unspecified lower extremity (HCC) 01/12/2024   Coping style affecting medical condition 12/15/2023   Myelopathy concurrent with and due to spinal stenosis of thoracic region [facility]) 12/06/2023   Spinal stenosis 12/06/2023   Thoracic myelopathy 12/06/2023   Morbid obesity (HCC) 12/06/2023   Depression 12/06/2023   Radiculopathy, lumbar region 11/10/2023   Flexion contracture of knee, left 03/01/2023   Prediabetes 11/19/2022   Status post laparoscopic appendectomy 01/14/2021   H/O total knee replacement, left 11/11/2020   Leiomyoma 11/08/2020   Arthritis 09/26/2018   Onychomycosis 09/26/2018   Hyperlipidemia LDL goal <100 08/31/2016   OSA on CPAP 08/31/2016   Premature ejaculation 08/19/2011    ONSET DATE: 12/16/2023  REFERRING DIAG: M47.14 (ICD-10-CM) - Thoracic myelopathy  THERAPY DIAG:  Muscle weakness (generalized)  Unsteadiness on feet  Other abnormalities of gait and mobility  Other lack of coordination  Pain in thoracic spine  Rationale for Evaluation and Treatment: Rehabilitation  SUBJECTIVE:  SUBJECTIVE STATEMENT: Brought in his cane today. He reports he has been walking with the cane and feels no instability, but sometimes his knees randomly buckle which is what happened when he fell twice yesterday.  He denies injury and was using the cane when he had taken 2 steps on the sidewalk after coming down off the stairs at his home and his legs just gave out.  He continues to feel weaker in his legs after sitting for too long.  Pt accompanied by: self   PERTINENT HISTORY: Status post T3 transpedicular decompression, T1-T4 PSF 12/07/2023 due to severe thoracic myelopathy with progressive ambulatory functional loss per  Dr. Penne Sharps neurosurgery.NO BRACE REQUIRED. Continues to have some pain in his mid back up to his neck. He does feel as though the weakness in his legs is improving however he continues to use a rollator. No new weakness, numbness or tingling.Of note his operative course was complicated after shown to have a DVT postoperatively on day 3. He continues to take Eliquis  for this   Got inpatient rehab and was discharged home 12/20/23  PMH: L knee arthritis, HLD, HTN, L knee replacement 2022  PAIN:  Are you having pain? No, just having some tightness   LUE in sitting: Vitals:   02/22/24 0857  BP: 138/69  Pulse: 90      PRECAUTIONS: Back  Precaution/Restrictions Comments: spinal precautions, no brace needed per orders - spinal precautions lifted at 8/13 visit - can lift up to 25 lbs, can bend and twist to tolerance  FALLS: Has patient fallen in last 6 months? Yes. Number of falls 20, did not injure himself, legs were weak and didn't have strength in his legs  LIVING ENVIRONMENT: Lives with: lives with their spouse Lives in: House/apartment Stairs: Yes: External: 2 steps; none Has following equipment at home: Single point cane, Walker - 2 wheeled, Environmental consultant - 4 wheeled, shower chair, and raised grab bars  PLOF: Independent, Vocation/Vocational requirements: Estate agent , and Leisure: Working out, taking walks   PATIENT GOALS: Wants to get back to being independent, getting his strength back and walking on his own   OBJECTIVE:  Note: Objective measures were completed at Evaluation unless otherwise noted.  COGNITION: Overall cognitive status: Within functional limits for tasks assessed   SENSATION: Light touch: WFL Proprioception: WFL and with ankle DF/PF, noted some bouts of clonus bilat when testing into ankle DF   Pt reporting numbness/tingling in L knee and both feet have numbness/tingling and coldness  Sometimes if he rubs up against something will feel warmth    COORDINATION: Heel to shin: slightly more difficult with LLE    POSTURE: No Significant postural limitations  LOWER EXTREMITY ROM:    Pt with limited L knee extension/flexion ROM due to hx of L knee replacement   LOWER EXTREMITY MMT:    MMT Right Eval Left Eval  Hip flexion 4+ 4-  Hip extension    Hip abduction 4+ 4+  Hip adduction 5 5  Hip internal rotation    Hip external rotation    Knee flexion 5 4+  Knee extension 4+ 4-  Ankle dorsiflexion 5 5  Ankle plantarflexion    Ankle inversion    Ankle eversion    (Blank rows = not tested)  All tested in sitting   BED MOBILITY:  Pt reporting no difficulties, performing the log roll   TRANSFERS: Sit to stand: SBA  Assistive device utilized: None     Stand to sit: SBA  Assistive device utilized: None      Able to perform with no UE support, wide BOS with LLE staggered behind R  Pt was very pleased with this, did not realize he could perform without his hands   GAIT: Findings: Gait Characteristics: step through pattern, decreased stride length, and narrow BOS, Distance walked: Clinic distances, Assistive device utilized:Single point cane and Walker - 2 wheeled, Level of assistance: Modified independence, SBA, and CGA, and Comments: mod I with RW, SBA/CGA with SPC   FUNCTIONAL TESTS:  5 times sit to stand: 17.8 seconds with no UE support  Timed up and go (TUG): 16.5 seconds with RW, 20.1 seconds with SPC  10 meter walk test: 17.8 seconds with RW = 1.84 ft/sec                                                                                                                                  TREATMENT DATE: 02/22/24  Self-Care: Vitals:   02/22/24 0857  BP: 138/69  Pulse: 90   Assessed BP and WNL for therapy.  He continues to monitor at home w/ cuff and smartwatch and inquires if diastolic in 60s is too low - edu provided on safe limits and BP related symptoms.  TA: -SciFit x8 minutes in multi-peaks mode using  BUE/BLE up to level 5.0 for global strengthening and endurance.  NMR: -4 inch forward step taps SBA unsupported x15 each > lateral x10 each -4lb overhead and chest press x5 each w/ alternating LE elevated on 4 inch step  -Alternating LE elevated on 4 inch step w/ 8lb slam balls x12 each LE -Squat 8lb slam balls x10 > alternating staggered squat 8lb slam balls x10 each LE in rear SBA -using small physioball for modified ab rollouts x15 using elbow support and CGA -Attempted birddogs w/ increased compensation so reduced to LE only x12 and UE only x20, noted some proximal shoulder instability  PATIENT EDUCATION: Education details: Monitoring BP at home.  Discussed upcoming re-cert and adding aquatic visits - provided expectations sheet, pool list, and [facility] directions to pool.  Discussed using best judgement to pick AD for the day/task: if legs feeling weaker or having more pain/tightness or needing to walk very long or unlevel surfaces he may want to use rollator vs cane, etc. Person educated: Patient and Spouse Education method: Explanation, Demonstration, Verbal cues, and Handouts Education comprehension: verbalized understanding and returned demonstration  HOME EXERCISE PROGRAM:  Access Code: [email or web address] URL: [email or web address] Date: 02/15/2024 Prepared by: Sheffield Senate  Exercises - Supine Hamstring Stretch with Strap  - 1 x daily - 7 x weekly - 3 sets - 10 reps - Supine Heel Slide with Strap  - 1 x daily - 7 x weekly - 3 sets - 10 reps - Supine 90/90 Alternating Toe Touch  - 1 x daily - 7 x weekly - 3 sets - 10 reps - Dead Bug  - 1 x daily -  7 x weekly - 3 sets - 10 reps - Mini Squats with Walker and Chair  - 1 x daily - 7 x weekly - 4 sets - 10 reps - Side Stepping with Resistance at Ankles and Counter Support  - 1 x daily - 7 x weekly - 3 sets - 10 reps - Forward and Backward Monster Walk with Counter Support  - 1 x daily - 7 x weekly - 3 sets - 10 reps - Supine  Figure 4 Piriformis Stretch  - 1 x daily - 7 x weekly - 2 sets - 30 hold - Modified Thomas Stretch  - 1 x daily - 7 x weekly - 3 sets - 60 hold - Sidelying Hip Abduction  - 1 x daily - 5 x weekly - 2 sets - 10 reps - Single Leg Bridge  - 1 x daily - 5 x weekly - 1 sets - 10 reps  FOR AQUATIC USE: Access Code: [email or web address] URL: [email or web address] Date: 02/02/2024 Prepared by: Daved Bull  Exercises - Push-Up on Pool Wall  - 1 x daily - 7 x weekly - 3 sets - 10 reps - Forward and Backward Stepping at [employer]  - 1 x daily - 7 x weekly - 3 sets - 10 reps - Asymmetrical Shoulder Flexion Extension with Hand Floats  - 1 x daily - 7 x weekly - 3 sets - 10 reps - Heel Toe Raises at Pool Wall  - 1 x daily - 7 x weekly - 3 sets - 10 reps - Lateral Stepping at Pool Wall  - 1 x daily - 7 x weekly - 3 sets - 10 reps - Standing Hip Circles at [employer]  - 1 x daily - 7 x weekly - 3 sets - 10 reps - Lunge to Target at [employer]  - 1 x daily - 7 x weekly - 3 sets - 10 reps - Plank with Hip Extension at [employer]  - 1 x daily - 7 x weekly - 3 sets - 10 reps - Standing 3-Way Kick with Ankle Float at [employer]  - 1 x daily - 7 x weekly - 3 sets - 10 reps - Sitting Balance on Pool Noodle  - 1 x daily - 7 x weekly - 3 sets - 10 reps  GOALS: Goals reviewed with patient? Yes  SHORT TERM GOALS: Target date: 01/31/2024  Pt will be independent with initial HEP in order to build upon functional gains made in therapy. Baseline:  IND (9/3) Goal status: MET  2.  Pt will improve gait speed with LRAD to at least 2.2 ft/sec in order to demo improved community mobility.   Baseline: 17.8 seconds with RW = 1.84 ft/sec; 2.90 ft/sec w/ rollator (9/3) Goal status: MET  3.  Pt will improve TUG time to 17 seconds or less with SPC in order to demo decrease fall risk.  Baseline: 20.1 seconds with SPC; 16.29 sec w/ SPC SBA (9/3) Goal status: MET   LONG TERM GOALS: Target date: 02/21/2024  Pt will  be independent with initial HEP in order to build upon functional gains made in therapy. Baseline:  Goal status: INITIAL  2. Pt will improve BERG to at least a 46/56 in order to demo decr fall risk.  Baseline: 38/56 Goal status: INITIAL  3.   Pt will improve TUG time to 13.5 seconds or less with SPC/no AD in order to demo decrease fall risk. Baseline:  20.1 seconds with SPC;  16.29 sec w/ SPC SBA (9/3) Goal status: INITIAL  4.  Pt will improve gait speed with LRAD to at least 3.1 ft/sec in order to demo improved community mobility.  Baseline: 17.8 seconds with RW = 1.84 ft/sec; 2.90 ft/sec w/ rollator (9/3) Goal status: REVISED  5.  Pt will improve 5x sit<>stand to less than or equal to 14 sec to demonstrate improved functional strength and transfer efficiency.  Baseline: 17.8 seconds with no UE support Goal status: INITIAL  6.  Pt will ambulate at least 500' outdoors on unlevel surfaces with LRAD in order to demo improved community mobility.  Baseline:  Goal status: INITIAL   ASSESSMENT:  CLINICAL IMPRESSION: Pt's BP and HR better controlled this session.  He tolerates high level strength and balance challenges well this session.  He is able to stabilize unsupported in squat and variations.  He does have some knee buckling spontaneously which was not observed in session w/ or w/o cane, but he does seem impacted by prolonged sitting which was avoided between exercises with good stability and improved fluidity of movement.  He continues to benefit from skilled PT to address functional weakness and safety as he progresses to higher level of independence w/ LRAD. Will continue per POC.   OBJECTIVE IMPAIRMENTS: Abnormal gait, decreased activity tolerance, decreased balance, decreased coordination, decreased mobility, difficulty walking, decreased ROM, decreased strength, impaired flexibility, impaired sensation, postural dysfunction, and pain.   ACTIVITY LIMITATIONS: carrying, lifting,  bending, stairs, transfers, and locomotion level  PARTICIPATION LIMITATIONS: shopping, community activity, occupation, and yard work  PERSONAL FACTORS: Behavior pattern, Past/current experiences, Time since onset of injury/illness/exacerbation, and 3+ comorbidities: L knee arthritis, HLD, HTN, L knee replacement 2022 are also affecting patient's functional outcome.   REHAB POTENTIAL: Good  CLINICAL DECISION MAKING: Evolving/moderate complexity  EVALUATION COMPLEXITY: Moderate  PLAN:  PT FREQUENCY: 2x/week  PT DURATION: 8 weeks - anticipate originally 6 weeks   PLANNED INTERVENTIONS: [id number]- PT Re-evaluation, 97110-Therapeutic exercises, 97530- Therapeutic activity, [id number]- Neuromuscular re-education, 97535- Self Care, [id number]- Manual therapy, [phone]- Gait training, [phone]- Aquatic Therapy, Patient/Family education, Balance training, Stair training, and DME instructions  PLAN FOR NEXT SESSION: CHECK LTGS NEXT WEEK and do re-cert, potentially add in aquatic PT to POC?   gait with cane, BLE strength esp LLE- SLS tasks, dynamic stability work, resisted gait/step outs, lunge and squat variations; floor recovery. Try quadruped exercises - work on shoulder stability, modified plantigrade!  Aquatics:  pt is open to scheduling Thursday morning aquatic sessions at re-cert time.  He has aquatic HEP that can be reviewed.  He was provided expectations 9/23   Daved KATHEE Bull, PT, DPT 02/22/2024, 11:01 AM

## 2024-02-24 ENCOUNTER — Encounter: Admitting: Occupational Therapy

## 2024-02-24 ENCOUNTER — Encounter: Payer: Self-pay | Admitting: Physical Therapy

## 2024-02-24 ENCOUNTER — Other Ambulatory Visit: Payer: Self-pay

## 2024-02-24 ENCOUNTER — Ambulatory Visit: Admitting: Physical Therapy

## 2024-02-24 VITALS — BP 118/88 | HR 107

## 2024-02-24 DIAGNOSIS — R2681 Unsteadiness on feet: Secondary | ICD-10-CM | POA: Diagnosis not present

## 2024-02-24 DIAGNOSIS — M6281 Muscle weakness (generalized): Secondary | ICD-10-CM | POA: Diagnosis not present

## 2024-02-24 DIAGNOSIS — R2689 Other abnormalities of gait and mobility: Secondary | ICD-10-CM

## 2024-02-24 DIAGNOSIS — M4714 Other spondylosis with myelopathy, thoracic region: Secondary | ICD-10-CM | POA: Diagnosis not present

## 2024-02-24 DIAGNOSIS — M546 Pain in thoracic spine: Secondary | ICD-10-CM | POA: Diagnosis not present

## 2024-02-24 DIAGNOSIS — R278 Other lack of coordination: Secondary | ICD-10-CM | POA: Diagnosis not present

## 2024-02-24 DIAGNOSIS — M4804 Spinal stenosis, thoracic region: Secondary | ICD-10-CM

## 2024-02-24 NOTE — Therapy (Signed)
 OUTPATIENT PHYSICAL THERAPY NEURO TREATMENT   Patient Name: Corey [name] MRN: [id number] DOB:September 01, 1965, 58 y.o., male Today's Date: 02/24/2024   PCP: Joyce Norleen BROCKS, MD REFERRING PROVIDER: Pegge Toribio PARAS, PA-C  END OF SESSION:  PT End of Session - 02/24/24 0933     Visit Number 12    Number of Visits 13    Date for Recertification  03/10/24    Authorization Type [employer]    PT Start Time 0932    PT Stop Time 1012    PT Time Calculation (min) 40 min    Equipment Utilized During Treatment Gait belt    Activity Tolerance Patient tolerated treatment well    Behavior During Therapy WFL for tasks assessed/performed          Past Medical History:  Diagnosis Date   Arthritis    knee, left   ED (erectile dysfunction)    Hyperlipidemia    Hypertension    Sleep apnea    CPAP - not using lately due to recall    Past Surgical History:  Procedure Laterality Date   ANKLE BONE SURGERY     APPLICATION OF INTRAOPERATIVE CT SCAN  12/07/2023   Procedure: APPLICATION OF INTRAOPERATIVE CT SCAN;  Surgeon: Claudene Penne ORN, MD;  Location: [facility] ORS;  Service: Neurosurgery;;   BACK SURGERY     COLLAR BONE SURGEY     COLONOSCOPY     FRACTURE SURGERY     HERNIA REPAIR     IR IVC FILTER PLMT / S&I /IMG GUID/MOD SED  12/12/2023   KNEE ARTHROSCOPY Left 2006   LAPAROSCOPIC APPENDECTOMY N/A 01/13/2021   Procedure: APPENDECTOMY LAPAROSCOPIC WITH LYSIS OF ADHESIONS;  Surgeon: Sheldon Standing, MD;  Location: [facility];  Service: General;  Laterality: N/A;   LUMBAR MICRODISCECTOMY     POLYPECTOMY     SKIN BIOPSY Left 08/11/2021   nodulocystic fat nercrosis   TOTAL KNEE ARTHROPLASTY Left 11/11/2020   Procedure: LEFT TOTAL KNEE ARTHROPLASTY;  Surgeon: Barbarann Oneil BROCKS, MD;  Location: [facility];  Service: Orthopedics;  Laterality: Left;   UMBILICAL HERNIA REPAIR     Patient Active Problem List   Diagnosis Date Noted   Anxiety 01/12/2024   Chronic low back pain 01/12/2024   Acute  embolism and thrombosis of unspecified deep veins of unspecified lower extremity (HCC) 01/12/2024   Coping style affecting medical condition 12/15/2023   Myelopathy concurrent with and due to spinal stenosis of thoracic region [facility]) 12/06/2023   Spinal stenosis 12/06/2023   Thoracic myelopathy 12/06/2023   Morbid obesity (HCC) 12/06/2023   Depression 12/06/2023   Radiculopathy, lumbar region 11/10/2023   Flexion contracture of knee, left 03/01/2023   Prediabetes 11/19/2022   Status post laparoscopic appendectomy 01/14/2021   H/O total knee replacement, left 11/11/2020   Leiomyoma 11/08/2020   Arthritis 09/26/2018   Onychomycosis 09/26/2018   Hyperlipidemia LDL goal <100 08/31/2016   OSA on CPAP 08/31/2016   Premature ejaculation 08/19/2011    ONSET DATE: 12/16/2023  REFERRING DIAG: M47.14 (ICD-10-CM) - Thoracic myelopathy  THERAPY DIAG:  Muscle weakness (generalized)  Unsteadiness on feet  Other abnormalities of gait and mobility  Rationale for Evaluation and Treatment: Rehabilitation  SUBJECTIVE:  SUBJECTIVE STATEMENT:   [facility] and [facility] now [facility] and [facility]. No new falls since he was last here. Brought in his rollator today. Legs are feeling a little tight today.   Pt accompanied by: self and pt's spouse   PERTINENT HISTORY: Status post T3 transpedicular decompression, T1-T4 PSF 12/07/2023 due to severe thoracic myelopathy with progressive ambulatory functional loss per Dr. Penne Sharps neurosurgery.NO BRACE REQUIRED. Continues to have some pain in his mid back up to his neck. He does feel as though the weakness in his legs is improving however he continues to use a rollator. No new weakness, numbness or tingling.Of note his operative course was complicated after shown  to have a DVT postoperatively on day 3. He continues to take Eliquis  for this   Got inpatient rehab and was discharged home 12/20/23  PMH: L knee arthritis, HLD, HTN, L knee replacement 2022  PAIN:  Are you having pain? No, just having some tightness   LUE in sitting: Vitals:   02/24/24 0939  BP: 118/88  Pulse: (!) 107    PRECAUTIONS: Back  Precaution/Restrictions Comments: spinal precautions, no brace needed per orders - spinal precautions lifted at 8/13 visit - can lift up to 25 lbs, can bend and twist to tolerance  FALLS: Has patient fallen in last 6 months? Yes. Number of falls 20, did not injure himself, legs were weak and didn't have strength in his legs  LIVING ENVIRONMENT: Lives with: lives with their spouse Lives in: House/apartment Stairs: Yes: External: 2 steps; none Has following equipment at home: Single point cane, Walker - 2 wheeled, Environmental consultant - 4 wheeled, shower chair, and raised grab bars  PLOF: Independent, Vocation/Vocational requirements: Estate agent , and Leisure: Working out, taking walks   PATIENT GOALS: Wants to get back to being independent, getting his strength back and walking on his own   OBJECTIVE:  Note: Objective measures were completed at Evaluation unless otherwise noted.  COGNITION: Overall cognitive status: Within functional limits for tasks assessed   SENSATION: Light touch: WFL Proprioception: WFL and with ankle DF/PF, noted some bouts of clonus bilat when testing into ankle DF   Pt reporting numbness/tingling in L knee and both feet have numbness/tingling and coldness  Sometimes if he rubs up against something will feel warmth   COORDINATION: Heel to shin: slightly more difficult with LLE    POSTURE: No Significant postural limitations  LOWER EXTREMITY ROM:    Pt with limited L knee extension/flexion ROM due to hx of L knee replacement   LOWER EXTREMITY MMT:    MMT Right Eval Left Eval  Hip flexion 4+ 4-  Hip  extension    Hip abduction 4+ 4+  Hip adduction 5 5  Hip internal rotation    Hip external rotation    Knee flexion 5 4+  Knee extension 4+ 4-  Ankle dorsiflexion 5 5  Ankle plantarflexion    Ankle inversion    Ankle eversion    (Blank rows = not tested)  All tested in sitting   BED MOBILITY:  Pt reporting no difficulties, performing the log roll   TRANSFERS: Sit to stand: SBA  Assistive device utilized: None     Stand to sit: SBA  Assistive device utilized: None      Able to perform with no UE support, wide BOS with LLE staggered behind R  Pt was very pleased with this, did not realize he could perform without his hands   GAIT:  Findings: Gait Characteristics: step through pattern, decreased stride length, and narrow BOS, Distance walked: Clinic distances, Assistive device utilized:Single point cane and Walker - 2 wheeled, Level of assistance: Modified independence, SBA, and CGA, and Comments: mod I with RW, SBA/CGA with SPC   FUNCTIONAL TESTS:  5 times sit to stand: 17.8 seconds with no UE support  Timed up and go (TUG): 16.5 seconds with RW, 20.1 seconds with SPC  10 meter walk test: 17.8 seconds with RW = 1.84 ft/sec                                                                                                                               TREATMENT DATE: 02/24/24  Therapeutic Activity:  Vitals:   02/24/24 0939  BP: 118/88  Pulse: (!) 107    Assessed BP and WNL for therapy.  He continues to monitor at home and is now wearing a heart monitor for elevated HR   Discussed POC going forwards - will add an additional 2x week for 4 weeks with 1 visit for land therapy and 1 visit for aquatic therapy. Pt in agreement with plan and will get scheduled when leaving today   At staircase: 2 x 10 reps forward step ups with contralateral march, pt reporting RPE as 7/10 10 reps lateral step ups with march, pt needing intermittent fingertip support for balance   Therapeutic  Exercise: Staggered stance mini squats 2 x 10 reps each side while holding 10# medicine ball, pt more challenged with LLE  In // bars with 3# ankle weights: 2 x 10 reps hip ABD with legs straight, 2nd rep able to perform on RLE with no UE support, needs fingertip support on LLE Alternating stepping over smaller orange obstacle for SLS stability and hip/knee flexion 2 x 10 reps each side, needs fingertip support for balance, cues to focus on hip flexion and not circumduction  Seated LAQs with 3# ankle weight 2 x 10 reps with 2 second pulse at end range, pt fatigues easier with LLE   HR at end of session 132 bpm   PATIENT EDUCATION: Education details: Continue monitoring BP at home, continue HEP, adding more appts in POC for land and pool therapy  Person educated: Patient and Spouse Education method: Explanation, Demonstration, and Verbal cues Education comprehension: verbalized understanding and returned demonstration  HOME EXERCISE PROGRAM:  Access Code: [email or web address] URL: [email or web address] Date: 02/15/2024 Prepared by: Sheffield Senate  Exercises - Supine Hamstring Stretch with Strap  - 1 x daily - 7 x weekly - 3 sets - 10 reps - Supine Heel Slide with Strap  - 1 x daily - 7 x weekly - 3 sets - 10 reps - Supine 90/90 Alternating Toe Touch  - 1 x daily - 7 x weekly - 3 sets - 10 reps - Dead Bug  - 1 x daily - 7 x weekly - 3 sets - 10 reps - Mini Squats with Environmental consultant and Chair  -  1 x daily - 7 x weekly - 4 sets - 10 reps - Side Stepping with Resistance at Ankles and Counter Support  - 1 x daily - 7 x weekly - 3 sets - 10 reps - Forward and Backward Monster Walk with Counter Support  - 1 x daily - 7 x weekly - 3 sets - 10 reps - Supine Figure 4 Piriformis Stretch  - 1 x daily - 7 x weekly - 2 sets - 30 hold - Modified Thomas Stretch  - 1 x daily - 7 x weekly - 3 sets - 60 hold - Sidelying Hip Abduction  - 1 x daily - 5 x weekly - 2 sets - 10 reps - Single Leg Bridge  - 1 x  daily - 5 x weekly - 1 sets - 10 reps  FOR AQUATIC USE: Access Code: [email or web address] URL: [email or web address] Date: 02/02/2024 Prepared by: Daved Bull  Exercises - Push-Up on Pool Wall  - 1 x daily - 7 x weekly - 3 sets - 10 reps - Forward and Backward Stepping at [employer]  - 1 x daily - 7 x weekly - 3 sets - 10 reps - Asymmetrical Shoulder Flexion Extension with Hand Floats  - 1 x daily - 7 x weekly - 3 sets - 10 reps - Heel Toe Raises at Pool Wall  - 1 x daily - 7 x weekly - 3 sets - 10 reps - Lateral Stepping at Pool Wall  - 1 x daily - 7 x weekly - 3 sets - 10 reps - Standing Hip Circles at [employer]  - 1 x daily - 7 x weekly - 3 sets - 10 reps - Lunge to Target at [employer]  - 1 x daily - 7 x weekly - 3 sets - 10 reps - Plank with Hip Extension at [employer]  - 1 x daily - 7 x weekly - 3 sets - 10 reps - Standing 3-Way Kick with Ankle Float at [employer]  - 1 x daily - 7 x weekly - 3 sets - 10 reps - Sitting Balance on Pool Noodle  - 1 x daily - 7 x weekly - 3 sets - 10 reps   GOALS: Goals reviewed with patient? Yes  SHORT TERM GOALS: Target date: 01/31/2024  Pt will be independent with initial HEP in order to build upon functional gains made in therapy. Baseline:  IND (9/3) Goal status: MET  2.  Pt will improve gait speed with LRAD to at least 2.2 ft/sec in order to demo improved community mobility.   Baseline: 17.8 seconds with RW = 1.84 ft/sec; 2.90 ft/sec w/ rollator (9/3) Goal status: MET  3.  Pt will improve TUG time to 17 seconds or less with SPC in order to demo decrease fall risk.  Baseline: 20.1 seconds with SPC; 16.29 sec w/ SPC SBA (9/3) Goal status: MET   LONG TERM GOALS: Target date: 02/21/2024  Pt will be independent with initial HEP in order to build upon functional gains made in therapy. Baseline:  Goal status: INITIAL  2. Pt will improve BERG to at least a 46/56 in order to demo decr fall risk.  Baseline: 38/56 Goal status:  INITIAL  3.   Pt will improve TUG time to 13.5 seconds or less with SPC/no AD in order to demo decrease fall risk. Baseline:  20.1 seconds with SPC; 16.29 sec w/ SPC SBA (9/3) Goal status: INITIAL  4.  Pt will improve gait speed  with LRAD to at least 3.1 ft/sec in order to demo improved community mobility.  Baseline: 17.8 seconds with RW = 1.84 ft/sec; 2.90 ft/sec w/ rollator (9/3) Goal status: REVISED  5.  Pt will improve 5x sit<>stand to less than or equal to 14 sec to demonstrate improved functional strength and transfer efficiency.  Baseline: 17.8 seconds with no UE support Goal status: INITIAL  6.  Pt will ambulate at least 500' outdoors on unlevel surfaces with LRAD in order to demo improved community mobility.  Baseline:  Goal status: INITIAL   ASSESSMENT:  CLINICAL IMPRESSION: Pt's BP WNL today and HR still elevated at rest, but pt recently saw the physician recently and is now wearing a heart monitor. Today's skilled session continued to focus on BLE strengthening and standing balance tasks. Pt more challenged with LLE for step ups and quad strengthening. Pt needing intermittent seated rest breaks due to fatigue. Pt brought in rollator today to session due to recent fall using his cane. Reviewed using the appropriate device depending on pt's fatigue levels. Will continue per POC.   OBJECTIVE IMPAIRMENTS: Abnormal gait, decreased activity tolerance, decreased balance, decreased coordination, decreased mobility, difficulty walking, decreased ROM, decreased strength, impaired flexibility, impaired sensation, postural dysfunction, and pain.   ACTIVITY LIMITATIONS: carrying, lifting, bending, stairs, transfers, and locomotion level  PARTICIPATION LIMITATIONS: shopping, community activity, occupation, and yard work  PERSONAL FACTORS: Behavior pattern, Past/current experiences, Time since onset of injury/illness/exacerbation, and 3+ comorbidities: L knee arthritis, HLD, HTN, L knee  replacement 2022 are also affecting patient's functional outcome.   REHAB POTENTIAL: Good  CLINICAL DECISION MAKING: Evolving/moderate complexity  EVALUATION COMPLEXITY: Moderate  PLAN:  PT FREQUENCY: 2x/week  PT DURATION: 8 weeks - anticipate originally 6 weeks   PLANNED INTERVENTIONS: [id number]- PT Re-evaluation, 97110-Therapeutic exercises, 97530- Therapeutic activity, [id number]- Neuromuscular re-education, 97535- Self Care, [id number]- Manual therapy, [phone]- Gait training, [phone]- Aquatic Therapy, Patient/Family education, Balance training, Stair training, and DME instructions  PLAN FOR NEXT SESSION: CHECK LTGS NEXT WEEK and do re-cert  gait with cane, BLE strength esp LLE- SLS tasks, dynamic stability work, resisted gait/step outs, lunge and squat variations; floor recovery. Try quadruped exercises - work on shoulder stability, modified plantigrade!  Aquatics:  pt is open to scheduling Thursday morning aquatic sessions at re-cert time.  He has aquatic HEP that can be reviewed.  He was provided expectations 9/23   Sheffield LOISE Senate, PT, DPT 02/24/2024, 12:02 PM

## 2024-02-28 ENCOUNTER — Ambulatory Visit
Admission: RE | Admit: 2024-02-28 | Discharge: 2024-02-28 | Disposition: A | Discharge status: Home / Self Care | Source: Ambulatory Visit | Attending: Physician Assistant | Admitting: Physician Assistant

## 2024-02-28 ENCOUNTER — Ambulatory Visit: Admitting: Physician Assistant

## 2024-02-28 ENCOUNTER — Ambulatory Visit
Admission: RE | Admit: 2024-02-28 | Discharge: 2024-02-28 | Disposition: A | Discharge status: Home / Self Care | Attending: Physician Assistant | Admitting: Physician Assistant

## 2024-02-28 ENCOUNTER — Encounter: Payer: Self-pay | Admitting: Physician Assistant

## 2024-02-28 VITALS — BP 126/82 | Ht 69.0 in | Wt 235.0 lb

## 2024-02-28 DIAGNOSIS — M4804 Spinal stenosis, thoracic region: Secondary | ICD-10-CM

## 2024-02-28 DIAGNOSIS — G992 Myelopathy in diseases classified elsewhere: Secondary | ICD-10-CM | POA: Insufficient documentation

## 2024-02-28 DIAGNOSIS — M2548 Effusion, other site: Secondary | ICD-10-CM | POA: Diagnosis not present

## 2024-02-28 DIAGNOSIS — M5104 Intervertebral disc disorders with myelopathy, thoracic region: Secondary | ICD-10-CM

## 2024-02-28 DIAGNOSIS — Z09 Encounter for follow-up examination after completed treatment for conditions other than malignant neoplasm: Secondary | ICD-10-CM

## 2024-02-28 NOTE — Progress Notes (Signed)
   REFERRING PHYSICIAN:  Joyce Norleen BROCKS, Md [address] [place],  [place] [address]  DOS: T1-T4 Posterior Spinal instrumentation and Fusion, T3 Transpedicular decompression, Additonal Laminectomies, 12/07/23  HISTORY OF PRESENT ILLNESS: Corey [name] is approximately 12 weeks status post T1-4 posterior spinal instrumentation and fusion, T3 transpedicular decompression due to severe thoracic myelopathy with progressive ambulatory functional loss. he is doing well.  He has been continuing with physical therapy.  He has been using his rollator to walk.    PHYSICAL EXAMINATION:  General: Patient is well developed, well nourished, calm, collected, and in no apparent distress.   NEUROLOGICAL:  General: In no acute distress.   Awake, alert, oriented to person, place, and time.  Pupils equal round and reactive to light.  Facial tone is symmetric.     Strength:            Side Iliopsoas Quads Hamstring PF DF EHL  R 4+ 5 5 5 5 5   L 4+ 5 5 5 5 5    Incision c/d/i   ROS (Neurologic):  Negative except as noted above  IMAGING: No new imaging prior to this appointment.  ASSESSMENT/PLAN:  Corey [name] is approximately 12 weeks status post T1-4 posterior spinal instrumentation and fusion, T3 transpedicular decompression due to severe thoracic myelopathy with progressive ambulatory functional loss.  Since surgery has had a significant improvement in his motor function.  He does continue to have symptoms consistent with myelopathy.  We discussed that this will likely be a long recovery.  He is currently walking with a walker.  His motor strength is improved significantly.  I have encouraged him to continue working with physical therapy.  He also plans to follow-up with PM&R regarding his blood clots.  I do think it is very reasonable for patient to continue to be out of work as he is still recovering from his spinal cord injury.   Lyle Decamp, PA-C Department of neurosurgery

## 2024-02-29 ENCOUNTER — Ambulatory Visit: Admitting: Family Medicine

## 2024-02-29 ENCOUNTER — Encounter: Payer: Self-pay | Admitting: Physical Therapy

## 2024-02-29 ENCOUNTER — Encounter: Payer: Self-pay | Admitting: Family Medicine

## 2024-02-29 ENCOUNTER — Ambulatory Visit: Admitting: Physical Therapy

## 2024-02-29 ENCOUNTER — Encounter: Admitting: Occupational Therapy

## 2024-02-29 VITALS — BP 132/76 | HR 103 | Ht 69.0 in | Wt 236.6 lb

## 2024-02-29 VITALS — BP 128/90 | HR 99

## 2024-02-29 DIAGNOSIS — R2681 Unsteadiness on feet: Secondary | ICD-10-CM

## 2024-02-29 DIAGNOSIS — M6281 Muscle weakness (generalized): Secondary | ICD-10-CM

## 2024-02-29 DIAGNOSIS — R278 Other lack of coordination: Secondary | ICD-10-CM | POA: Diagnosis not present

## 2024-02-29 DIAGNOSIS — M4804 Spinal stenosis, thoracic region: Secondary | ICD-10-CM

## 2024-02-29 DIAGNOSIS — M4714 Other spondylosis with myelopathy, thoracic region: Secondary | ICD-10-CM

## 2024-02-29 DIAGNOSIS — I82401 Acute embolism and thrombosis of unspecified deep veins of right lower extremity: Secondary | ICD-10-CM

## 2024-02-29 DIAGNOSIS — R2689 Other abnormalities of gait and mobility: Secondary | ICD-10-CM

## 2024-02-29 DIAGNOSIS — M546 Pain in thoracic spine: Secondary | ICD-10-CM | POA: Diagnosis not present

## 2024-02-29 DIAGNOSIS — G4733 Obstructive sleep apnea (adult) (pediatric): Secondary | ICD-10-CM

## 2024-02-29 NOTE — Progress Notes (Signed)
   Subjective:    Patient ID: Corey [name], male    DOB: 12/19/1965, 59 y.o.   MRN: [id number]  Discussed the use of AI scribe software for clinical note transcription with the patient, who gave verbal consent to proceed.  History of Present Illness   Corey [name] is a 58 year old male who presents with difficulty walking and considering early retirement due to inability to perform job duties.  He has ongoing issues with nerve damage, significantly impacting his ability to walk independently. 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 He reports that he can walk, but his legs will buckle and he experiences tightness in his thighs, which affects his work capability.  He is undergoing physical therapy and will start aquatic therapy next week. Despite keeping up with exercises and stretches, he remains unable to walk without assistance.   He is not receiving short-term disability payments and relies on savings and a [place] check for a 30% disability related to his knee. He is exploring options for increasing his [facility] disability percentage and checking on potential short-term disability benefits through his employer, the [employer] service.  He is being monitored for heart rate issues by the [place], following observations of elevated heart rate during physical therapy sessions. No palpitations or shortness of breath. Blood pressure readings have been slightly elevated, with a recent reading of 128/90 and a slightly elevated heart rate.           Review of Systems     Objective:    Physical Exam Physical Exam   VITALS: P- 100, BP- 128/90       Assessment & Plan:  Thoracic spondylosis with myelopathy and thoracic spinal stenosis Chronic condition causing nerve damage, difficulty walking, and muscle weakness. Progress slow, full recovery uncertain. Discussed early retirement and long-term disability due to job  performance issues. Advised on financial implications and disability options. - Continue physical therapy and start aquatic therapy next week. - Consult with neurosurgeon in December for further evaluation. - Explore short-term disability options with postal service and union representative. - Consider financial consultation for retirement and disability planning. - Check with [facility] for potential increase in military disability benefits.  Tachycardia Elevated heart rate with slight blood pressure increase. No palpitations or shortness of breath. Managed by [facility]. - Continue monitoring heart rate with [facility]. - Follow up with [facility] regarding heart rate and blood pressure management.     Over 25 minutes spent discussing these issues with him and his wife.

## 2024-02-29 NOTE — Therapy (Signed)
 OUTPATIENT PHYSICAL THERAPY NEURO TREATMENT/RE-CERT   Patient Name: Corey HELDT MRN: [id number] DOB:August 21, 1965, 58 y.o., male Today's Date: 02/29/2024   PCP: Joyce Norleen BROCKS, MD REFERRING PROVIDER: Pegge Toribio PARAS, PA-C  END OF SESSION:  PT End of Session - 02/29/24 0802     Visit Number 13    Number of Visits 21    Date for Recertification  03/30/24   per re-cert on 0/69/74   Authorization Type [employer]    PT Start Time 0801    PT Stop Time 0840    PT Time Calculation (min) 39 min    Equipment Utilized During Treatment Gait belt    Activity Tolerance Patient tolerated treatment well    Behavior During Therapy WFL for tasks assessed/performed          Past Medical History:  Diagnosis Date   Arthritis    knee, left   ED (erectile dysfunction)    Hyperlipidemia    Hypertension    Sleep apnea    CPAP - not using lately due to recall    Past Surgical History:  Procedure Laterality Date   ANKLE BONE SURGERY     APPLICATION OF INTRAOPERATIVE CT SCAN  12/07/2023   Procedure: APPLICATION OF INTRAOPERATIVE CT SCAN;  Surgeon: Claudene Penne ORN, MD;  Location: [facility];  Service: Neurosurgery;;   BACK SURGERY     COLLAR BONE SURGEY     COLONOSCOPY     FRACTURE SURGERY     HERNIA REPAIR     IR IVC FILTER PLMT / S&I /IMG GUID/MOD SED  12/12/2023   KNEE ARTHROSCOPY Left 2006   LAPAROSCOPIC APPENDECTOMY N/A 01/13/2021   Procedure: APPENDECTOMY LAPAROSCOPIC WITH LYSIS OF ADHESIONS;  Surgeon: Sheldon Standing, MD;  Location: [facility];  Service: General;  Laterality: N/A;   LUMBAR MICRODISCECTOMY     POLYPECTOMY     SKIN BIOPSY Left 08/11/2021   nodulocystic fat nercrosis   TOTAL KNEE ARTHROPLASTY Left 11/11/2020   Procedure: LEFT TOTAL KNEE ARTHROPLASTY;  Surgeon: Barbarann Oneil BROCKS, MD;  Location: [facility];  Service: Orthopedics;  Laterality: Left;   UMBILICAL HERNIA REPAIR     Patient Active Problem List   Diagnosis Date Noted   Anxiety 01/12/2024   Chronic low  back pain 01/12/2024   Acute embolism and thrombosis of unspecified deep veins of unspecified lower extremity (HCC) 01/12/2024   Coping style affecting medical condition 12/15/2023   Myelopathy concurrent with and due to spinal stenosis of thoracic region [facility]) 12/06/2023   Spinal stenosis 12/06/2023   Thoracic myelopathy 12/06/2023   Morbid obesity (HCC) 12/06/2023   Depression 12/06/2023   Radiculopathy, lumbar region 11/10/2023   Flexion contracture of knee, left 03/01/2023   Prediabetes 11/19/2022   Status post laparoscopic appendectomy 01/14/2021   H/O total knee replacement, left 11/11/2020   Leiomyoma 11/08/2020   Arthritis 09/26/2018   Onychomycosis 09/26/2018   Hyperlipidemia LDL goal <100 08/31/2016   OSA on CPAP 08/31/2016   Premature ejaculation 08/19/2011    ONSET DATE: 12/16/2023  REFERRING DIAG: M47.14 (ICD-10-CM) - Thoracic myelopathy  THERAPY DIAG:  Muscle weakness (generalized)  Unsteadiness on feet  Other abnormalities of gait and mobility  Rationale for Evaluation and Treatment: Rehabilitation  SUBJECTIVE:  SUBJECTIVE STATEMENT:   Saw the doctor yesterday and reports they extended his leave for work. Is thinking about early retirement. No new falls. Still needing to work on his balance.   Pt accompanied by: self   PERTINENT HISTORY: Status post T3 transpedicular decompression, T1-T4 PSF 12/07/2023 due to severe thoracic myelopathy with progressive ambulatory functional loss per Dr. Penne Sharps neurosurgery.NO BRACE REQUIRED. Continues to have some pain in his mid back up to his neck. He does feel as though the weakness in his legs is improving however he continues to use a rollator. No new weakness, numbness or tingling.Of note his operative course was complicated after  shown to have a DVT postoperatively on day 3. He continues to take Eliquis  for this   Got inpatient rehab and was discharged home 12/20/23  PMH: L knee arthritis, HLD, HTN, L knee replacement 2022  PAIN:  Are you having pain? No, just having some tightness   LUE in sitting: Vitals:   02/29/24 0808  BP: (!) 128/90  Pulse: 99     PRECAUTIONS: Back  Precaution/Restrictions Comments: spinal precautions, no brace needed per orders - spinal precautions lifted at 8/13 visit - can lift up to 25 lbs, can bend and twist to tolerance  FALLS: Has patient fallen in last 6 months? Yes. Number of falls 20, did not injure himself, legs were weak and didn't have strength in his legs  LIVING ENVIRONMENT: Lives with: lives with their spouse Lives in: House/apartment Stairs: Yes: External: 2 steps; none Has following equipment at home: Single point cane, Walker - 2 wheeled, Environmental consultant - 4 wheeled, shower chair, and raised grab bars  PLOF: Independent, Vocation/Vocational requirements: Estate agent , and Leisure: Working out, taking walks   PATIENT GOALS: Wants to get back to being independent, getting his strength back and walking on his own   OBJECTIVE:  Note: Objective measures were completed at Evaluation unless otherwise noted.  COGNITION: Overall cognitive status: Within functional limits for tasks assessed   SENSATION: Light touch: WFL Proprioception: WFL and with ankle DF/PF, noted some bouts of clonus bilat when testing into ankle DF   Pt reporting numbness/tingling in L knee and both feet have numbness/tingling and coldness  Sometimes if he rubs up against something will feel warmth   COORDINATION: Heel to shin: slightly more difficult with LLE    POSTURE: No Significant postural limitations  LOWER EXTREMITY ROM:    Pt with limited L knee extension/flexion ROM due to hx of L knee replacement   LOWER EXTREMITY MMT:    MMT Right Eval Left Eval  Hip flexion 4+ 4-  Hip  extension    Hip abduction 4+ 4+  Hip adduction 5 5  Hip internal rotation    Hip external rotation    Knee flexion 5 4+  Knee extension 4+ 4-  Ankle dorsiflexion 5 5  Ankle plantarflexion    Ankle inversion    Ankle eversion    (Blank rows = not tested)  All tested in sitting   BED MOBILITY:  Pt reporting no difficulties, performing the log roll   TRANSFERS: Sit to stand: SBA  Assistive device utilized: None     Stand to sit: SBA  Assistive device utilized: None      Able to perform with no UE support, wide BOS with LLE staggered behind R  Pt was very pleased with this, did not realize he could perform without his hands   GAIT: Findings: Gait Characteristics: step through pattern, decreased  stride length, and narrow BOS, Distance walked: Clinic distances, Assistive device utilized:Single point cane and Walker - 2 wheeled, Level of assistance: Modified independence, SBA, and CGA, and Comments: mod I with RW, SBA/CGA with SPC   FUNCTIONAL TESTS:  5 times sit to stand: 17.8 seconds with no UE support  Timed up and go (TUG): 16.5 seconds with RW, 20.1 seconds with SPC  10 meter walk test: 17.8 seconds with RW = 1.84 ft/sec                                                                                                                               TREATMENT DATE: 02/29/24  Therapeutic Activity:  Vitals:   02/29/24 0808  BP: (!) 128/90  Pulse: 99    Assessed BP and WNL for therapy.  He continues to monitor at home and is now wearing a heart monitor for elevated HR    [facility] PT Assessment - 02/29/24 0810       Berg Balance Test   Sit to Stand Able to stand without using hands and stabilize independently    Standing Unsupported Able to stand safely 2 minutes    Sitting with Back Unsupported but Feet Supported on Floor or Stool Able to sit safely and securely 2 minutes    Stand to Sit Sits safely with minimal use of hands    Transfers Able to transfer safely, minor use of  hands    Standing Unsupported with Eyes Closed Able to stand 10 seconds safely    Standing Unsupported with Feet Together Able to place feet together independently and stand 1 minute safely    From Standing, Reach Forward with Outstretched Arm Can reach confidently >25 cm (10)    From Standing Position, Pick up Object from Floor Able to pick up shoe safely and easily    From Standing Position, Turn to Look Behind Over each Shoulder Looks behind from both sides and weight shifts well    Turn 360 Degrees Able to turn 360 degrees safely one side only in 4 seconds or less   LLE 4 seconds, R 4.3 seconds   Standing Unsupported, Alternately Place Feet on Step/Stool Able to complete >2 steps/needs minimal assist    Standing Unsupported, One Foot in Front Able to place foot tandem independently and hold 30 seconds    Standing on One Leg Able to lift leg independently and hold 5-10 seconds   standing on RLE   Total Score 51    Berg comment: 51/56 = Moderate Fall Risk      Functional Gait  Assessment   Gait assessed  Yes    Gait Level Surface Walks 20 ft, slow speed, abnormal gait pattern, evidence for imbalance or deviates 10-15 in outside of the 12 in walkway width. Requires more than 7 sec to ambulate 20 ft.   11.65 seconds   Change in Gait Speed Makes only minor adjustments to walking speed, or accomplishes  a change in speed with significant gait deviations, deviates 10-15 in outside the 12 in walkway width, or changes speed but loses balance but is able to recover and continue walking.    Gait with Horizontal Head Turns Performs head turns smoothly with slight change in gait velocity (eg, minor disruption to smooth gait path), deviates 6-10 in outside 12 in walkway width, or uses an assistive device.    Gait with Vertical Head Turns Performs task with slight change in gait velocity (eg, minor disruption to smooth gait path), deviates 6 - 10 in outside 12 in walkway width or uses assistive device     Gait and Pivot Turn Pivot turns safely in greater than 3 sec and stops with no loss of balance, or pivot turns safely within 3 sec and stops with mild imbalance, requires small steps to catch balance.    Step Over Obstacle Is able to step over one shoe box (4.5 in total height) but must slow down and adjust steps to clear box safely. May require verbal cueing.    Gait with Narrow Base of Support Ambulates 7-9 steps.    Gait with Eyes Closed Walks 20 ft, slow speed, abnormal gait pattern, evidence for imbalance, deviates 10-15 in outside 12 in walkway width. Requires more than 9 sec to ambulate 20 ft.   12.4 seconds   Ambulating Backwards Walks 20 ft, uses assistive device, slower speed, mild gait deviations, deviates 6-10 in outside 12 in walkway width.   14.1 seconds   Steps Alternating feet, must use rail.    Total Score 16    FGA comment: performed with use of SPC          Goal Assessment: 5x sit <> stand: 12.1 seconds with no UE support  TUG: 14.6 seconds with SPC Gait speed: 13.9 seconds with SPC = 2.34 ft/sec   12.1 seconds with rollator = 2.1 ft/sec  Pt with one episode of knee buckling with SPC before assessing gait speed, requiring CGA from therapist   PATIENT EDUCATION: Education details: Results of goals, therapy goals going forwards, continue HEP, will start aquatic therapy next week  Person educated: Patient Education method: Explanation, Demonstration, and Verbal cues Education comprehension: verbalized understanding and returned demonstration  HOME EXERCISE PROGRAM:  Access Code: [email or web address] URL: [email or web address] Date: 02/15/2024 Prepared by: Sheffield Senate  Exercises - Supine Hamstring Stretch with Strap  - 1 x daily - 7 x weekly - 3 sets - 10 reps - Supine Heel Slide with Strap  - 1 x daily - 7 x weekly - 3 sets - 10 reps - Supine 90/90 Alternating Toe Touch  - 1 x daily - 7 x weekly - 3 sets - 10 reps - Dead Bug  - 1 x daily - 7 x weekly - 3  sets - 10 reps - Mini Squats with Walker and Chair  - 1 x daily - 7 x weekly - 4 sets - 10 reps - Side Stepping with Resistance at Ankles and Counter Support  - 1 x daily - 7 x weekly - 3 sets - 10 reps - Forward and Backward Monster Walk with Counter Support  - 1 x daily - 7 x weekly - 3 sets - 10 reps - Supine Figure 4 Piriformis Stretch  - 1 x daily - 7 x weekly - 2 sets - 30 hold - Modified Thomas Stretch  - 1 x daily - 7 x weekly - 3 sets - 60 hold - Sidelying Hip Abduction  -  1 x daily - 5 x weekly - 2 sets - 10 reps - Single Leg Bridge  - 1 x daily - 5 x weekly - 1 sets - 10 reps  FOR AQUATIC USE: Access Code: [email or web address] URL: [email or web address] Date: 02/02/2024 Prepared by: Daved Bull  Exercises - Push-Up on Pool Wall  - 1 x daily - 7 x weekly - 3 sets - 10 reps - Forward and Backward Stepping at [employer]  - 1 x daily - 7 x weekly - 3 sets - 10 reps - Asymmetrical Shoulder Flexion Extension with Hand Floats  - 1 x daily - 7 x weekly - 3 sets - 10 reps - Heel Toe Raises at Pool Wall  - 1 x daily - 7 x weekly - 3 sets - 10 reps - Lateral Stepping at Pool Wall  - 1 x daily - 7 x weekly - 3 sets - 10 reps - Standing Hip Circles at [employer]  - 1 x daily - 7 x weekly - 3 sets - 10 reps - Lunge to Target at [employer]  - 1 x daily - 7 x weekly - 3 sets - 10 reps - Plank with Hip Extension at [employer]  - 1 x daily - 7 x weekly - 3 sets - 10 reps - Standing 3-Way Kick with Ankle Float at [employer]  - 1 x daily - 7 x weekly - 3 sets - 10 reps - Sitting Balance on Pool Noodle  - 1 x daily - 7 x weekly - 3 sets - 10 reps   GOALS: Goals reviewed with patient? Yes  SHORT TERM GOALS: Target date: 01/31/2024  Pt will be independent with initial HEP in order to build upon functional gains made in therapy. Baseline:  IND (9/3) Goal status: MET  2.  Pt will improve gait speed with LRAD to at least 2.2 ft/sec in order to demo improved community mobility.   Baseline:  17.8 seconds with RW = 1.84 ft/sec; 2.90 ft/sec w/ rollator (9/3) Goal status: MET  3.  Pt will improve TUG time to 17 seconds or less with SPC in order to demo decrease fall risk.  Baseline: 20.1 seconds with SPC; 16.29 sec w/ SPC SBA (9/3) Goal status: MET   LONG TERM GOALS: Target date: 02/21/2024  Pt will be independent with initial HEP in order to build upon functional gains made in therapy. Baseline: pt has been consistently performing HEP  Goal status: MET  2. Pt will improve BERG to at least a 46/56 in order to demo decr fall risk.  Baseline: 38/56  51/56 (9/30) Goal status: MET  3.   Pt will improve TUG time to 13.5 seconds or less with SPC/no AD in order to demo decrease fall risk. Baseline:  20.1 seconds with SPC; 16.29 sec w/ SPC SBA (9/3)  14.6 seconds with SPC (9/30) Goal status: NOT MET  4.  Pt will improve gait speed with LRAD to at least 3.1 ft/sec in order to demo improved community mobility.  Baseline: 17.8 seconds with RW = 1.84 ft/sec; 2.90 ft/sec w/ rollator (9/3)  13.9 seconds with SPC = 2.34 ft/sec  12.1 seconds with rollator = 2.1 ft/sec Goal status: NOT MET  5.  Pt will improve 5x sit<>stand to less than or equal to 14 sec to demonstrate improved functional strength and transfer efficiency.  Baseline: 17.8 seconds with no UE support  12.1 seconds with no UE support (9/30) Goal status: MET  6.  Pt will ambulate at least 500' outdoors on unlevel surfaces with LRAD in order to demo improved community mobility.  Baseline: unable to check during session due to rainy weather  Goal status: N/A  UPDATED/REVISED GOALS FOR RE-CERT:  LONG TERM GOALS: Target date: 03/28/2024  Pt will be independent with final HEP for land/aquatic therapy in order to build upon functional gains made in therapy. Baseline: pt has been consistently performing land HEP, will benefit from further updates/revisions  Goal status: REVISED  2. Pt will improve FGA to at least a  21/30 in order to demo decr fall risk. Baseline: 16/30 Goal status: NEW  3.   Pt will improve TUG time to 13.5 seconds or less with SPC/no AD in order to demo decrease fall risk. Baseline:  20.1 seconds with SPC; 16.29 sec w/ SPC SBA (9/3)  14.6 seconds with SPC (9/30) Goal status: ON-GOING  4.  Pt will improve gait speed with LRAD to at least 2.6 ft/sec in order to demo improved community mobility.  Baseline: 17.8 seconds with RW = 1.84 ft/sec; 2.90 ft/sec w/ rollator (9/3)  13.9 seconds with SPC = 2.34 ft/sec  12.1 seconds with rollator = 2.1 ft/sec Goal status: REVISED     ASSESSMENT:  CLINICAL IMPRESSION: Pt's vitals WNL for therapy. Today's skilled session focused on assessing pt's LTGs for re-cert. but pt recently saw the physician recently and is now wearing a heart monitor. Pt met 3 out of 5 LTGs in regards to BERG, 5x sit <> stand, and consistently performing his HEP. Pt improved his BERG score from a 38/56 > 51/56, decr pt's risk for falls. Pt did not meet LTGs #3 and #4 in regards to TUG and gait speed. Pt improved TUG with SPC to 14.6 seconds, but not quite to goal level and still putting pt at an incr risk for falls. Pt's gait speed with rollator and SPC still indicates pt is a limited community ambulator. Assessed FGA with use of SPC with pt scoring a 16/30, indicating a high risk for falls. Pt did had one episode of his L knee buckling with use of SPC and needing CGA. Pt reporting more frequent episodes of knee buckling and is following up with his PCP regarding this. Pt has been using his rollator for community mobility. Pt will begin aquatic therapy next week to review an aquatic HEP and continue to work on gait, balance and strength. Pt will continue to benefit from skilled PT to address impairments, improve functional mobility, and decr risk of falls. LTGs updated as appropriate for re-cert.   OBJECTIVE IMPAIRMENTS: Abnormal gait, decreased activity tolerance, decreased  balance, decreased coordination, decreased mobility, difficulty walking, decreased ROM, decreased strength, impaired flexibility, impaired sensation, postural dysfunction, and pain.   ACTIVITY LIMITATIONS: carrying, lifting, bending, stairs, transfers, and locomotion level  PARTICIPATION LIMITATIONS: shopping, community activity, occupation, and yard work  PERSONAL FACTORS: Behavior pattern, Past/current experiences, Time since onset of injury/illness/exacerbation, and 3+ comorbidities: L knee arthritis, HLD, HTN, L knee replacement 2022 are also affecting patient's functional outcome.   REHAB POTENTIAL: Good  CLINICAL DECISION MAKING: Evolving/moderate complexity  EVALUATION COMPLEXITY: Moderate  PLAN:  PT FREQUENCY: 2x/week  PT DURATION: 8 weeks plus an additional 2x week for 4 weeks per re-cert   PLANNED INTERVENTIONS: [id number]- PT Re-evaluation, 97110-Therapeutic exercises, 97530- Therapeutic activity, [id number]- Neuromuscular re-education, 97535- Self Care, [id number]- Manual therapy, [id number]- Gait training, [phone]- Aquatic Therapy, Patient/Family education, Balance training, Stair training, and DME instructions  PLAN FOR NEXT SESSION: how  was appt with PCP regarding his knee?  gait with cane, BLE strength esp LLE- SLS tasks, dynamic stability work, resisted gait/step outs, lunge and squat variations; floor recovery. Try quadruped exercises - work on shoulder stability, modified plantigrade!  Aquatics: He has aquatic HEP that can be reviewed. Work on gait, balance, functional strength    Sheffield LOISE Senate, PT, DPT 02/29/2024, 10:57 AM

## 2024-03-02 ENCOUNTER — Encounter: Payer: Self-pay | Admitting: Physical Therapy

## 2024-03-02 ENCOUNTER — Ambulatory Visit: Attending: Physician Assistant | Admitting: Physical Therapy

## 2024-03-02 ENCOUNTER — Encounter: Admitting: Occupational Therapy

## 2024-03-02 ENCOUNTER — Ambulatory Visit: Admitting: Physical Therapy

## 2024-03-02 VITALS — BP 125/86 | HR 97

## 2024-03-02 DIAGNOSIS — R2681 Unsteadiness on feet: Secondary | ICD-10-CM | POA: Insufficient documentation

## 2024-03-02 DIAGNOSIS — M546 Pain in thoracic spine: Secondary | ICD-10-CM | POA: Diagnosis not present

## 2024-03-02 DIAGNOSIS — M4714 Other spondylosis with myelopathy, thoracic region: Secondary | ICD-10-CM | POA: Diagnosis not present

## 2024-03-02 DIAGNOSIS — R2689 Other abnormalities of gait and mobility: Secondary | ICD-10-CM | POA: Diagnosis not present

## 2024-03-02 DIAGNOSIS — R278 Other lack of coordination: Secondary | ICD-10-CM | POA: Insufficient documentation

## 2024-03-02 DIAGNOSIS — M6281 Muscle weakness (generalized): Secondary | ICD-10-CM | POA: Insufficient documentation

## 2024-03-02 NOTE — Therapy (Signed)
 OUTPATIENT PHYSICAL THERAPY NEURO TREATMENT   Patient Name: Corey [name] MRN: [id number] DOB:18-Feb-1966, 58 y.o., male Today's Date: 03/02/2024   PCP: Joyce Norleen BROCKS, MD REFERRING PROVIDER: Pegge Toribio PARAS, PA-C  END OF SESSION:  PT End of Session - 03/02/24 0805     Visit Number 14    Number of Visits 21    Date for Recertification  03/30/24   per re-cert on 0/69/74   Authorization Type [employer]    PT Start Time 0804    PT Stop Time 0843    PT Time Calculation (min) 39 min    Equipment Utilized During Treatment Gait belt    Activity Tolerance Patient tolerated treatment well    Behavior During Therapy WFL for tasks assessed/performed          Past Medical History:  Diagnosis Date   Arthritis    knee, left   ED (erectile dysfunction)    Hyperlipidemia    Hypertension    Sleep apnea    CPAP - not using lately due to recall    Past Surgical History:  Procedure Laterality Date   ANKLE BONE SURGERY     APPLICATION OF INTRAOPERATIVE CT SCAN  12/07/2023   Procedure: APPLICATION OF INTRAOPERATIVE CT SCAN;  Surgeon: Claudene Penne ORN, MD;  Location: [facility] OR[facility];  Service: Neurosurgery;;   BACK SURGERY     COLLAR BONE SURGEY     COLONOSCOPY     FRACTURE SURGERY     HERNIA REPAIR     IR IVC FILTER PLMT / S&I /IMG GUID/MOD SED  12/12/2023   KNEE ARTHROSCOPY Left 2006   LAPAROSCOPIC APPENDECTOMY N/A 01/13/2021   Procedure: APPENDECTOMY LAPAROSCOPIC WITH LYSIS OF ADHESIONS;  Surgeon: Sheldon Standing, MD;  Location: [facility];  Service: General;  Laterality: N/A;   LUMBAR MICRODISCECTOMY     POLYPECTOMY     SKIN BIOPSY Left 08/11/2021   nodulocystic fat nercrosis   TOTAL KNEE ARTHROPLASTY Left 11/11/2020   Procedure: LEFT TOTAL KNEE ARTHROPLASTY;  Surgeon: Barbarann Oneil BROCKS, MD;  Location: [facility] OR;  Service: Orthopedics;  Laterality: Left;   UMBILICAL HERNIA REPAIR     Patient Active Problem List   Diagnosis Date Noted   Anxiety 01/12/2024   Chronic low back pain  01/12/2024   Acute embolism and thrombosis of unspecified deep veins of unspecified lower extremity (HCC) 01/12/2024   Coping style affecting medical condition 12/15/2023   Myelopathy concurrent with and due to spinal stenosis of thoracic region [facility]) 12/06/2023   Spinal stenosis 12/06/2023   Thoracic myelopathy 12/06/2023   Morbid obesity (HCC) 12/06/2023   Depression 12/06/2023   Radiculopathy, lumbar region 11/10/2023   Flexion contracture of knee, left 03/01/2023   Prediabetes 11/19/2022   Status post laparoscopic appendectomy 01/14/2021   H/O total knee replacement, left 11/11/2020   Leiomyoma 11/08/2020   Arthritis 09/26/2018   Onychomycosis 09/26/2018   Hyperlipidemia LDL goal <100 08/31/2016   OSA on CPAP 08/31/2016   Premature ejaculation 08/19/2011    ONSET DATE: 12/16/2023  REFERRING DIAG: M47.14 (ICD-10-CM) - Thoracic myelopathy  THERAPY DIAG:  Muscle weakness (generalized)  Unsteadiness on feet  Other abnormalities of gait and mobility  Rationale for Evaluation and Treatment: Rehabilitation  SUBJECTIVE:  SUBJECTIVE STATEMENT:   Saw his PCP yesterday and with his knees he just wanted him to continue with the voltaren gel. Going to look into retiring early and his PCP thought that was a good idea.  Still having tightness, but not as much as before.   Pt accompanied by: self   PERTINENT HISTORY: Status post T3 transpedicular decompression, T1-T4 PSF 12/07/2023 due to severe thoracic myelopathy with progressive ambulatory functional loss per Dr. Penne Sharps neurosurgery.NO BRACE REQUIRED. Continues to have some pain in his mid back up to his neck. He does feel as though the weakness in his legs is improving however he continues to use a rollator. No new weakness, numbness or  tingling.Of note his operative course was complicated after shown to have a DVT postoperatively on day 3. He continues to take Eliquis  for this   Got inpatient rehab and was discharged home 12/20/23  PMH: L knee arthritis, HLD, HTN, L knee replacement 2022  PAIN:  Are you having pain? No, just having some tightness   LUE in sitting: Vitals:   03/02/24 0809  BP: 125/86  Pulse: 97      PRECAUTIONS: Back  Precaution/Restrictions Comments: spinal precautions, no brace needed per orders - spinal precautions lifted at 8/13 visit - can lift up to 25 lbs, can bend and twist to tolerance  FALLS: Has patient fallen in last 6 months? Yes. Number of falls 20, did not injure himself, legs were weak and didn't have strength in his legs  LIVING ENVIRONMENT: Lives with: lives with their spouse Lives in: House/apartment Stairs: Yes: External: 2 steps; none Has following equipment at home: Single point cane, Walker - 2 wheeled, Environmental consultant - 4 wheeled, shower chair, and raised grab bars  PLOF: Independent, Vocation/Vocational requirements: Estate agent , and Leisure: Working out, taking walks   PATIENT GOALS: Wants to get back to being independent, getting his strength back and walking on his own   OBJECTIVE:  Note: Objective measures were completed at Evaluation unless otherwise noted.  COGNITION: Overall cognitive status: Within functional limits for tasks assessed   SENSATION: Light touch: WFL Proprioception: WFL and with ankle DF/PF, noted some bouts of clonus bilat when testing into ankle DF   Pt reporting numbness/tingling in L knee and both feet have numbness/tingling and coldness  Sometimes if he rubs up against something will feel warmth   COORDINATION: Heel to shin: slightly more difficult with LLE    POSTURE: No Significant postural limitations  LOWER EXTREMITY ROM:    Pt with limited L knee extension/flexion ROM due to hx of L knee replacement   LOWER EXTREMITY MMT:     MMT Right Eval Left Eval  Hip flexion 4+ 4-  Hip extension    Hip abduction 4+ 4+  Hip adduction 5 5  Hip internal rotation    Hip external rotation    Knee flexion 5 4+  Knee extension 4+ 4-  Ankle dorsiflexion 5 5  Ankle plantarflexion    Ankle inversion    Ankle eversion    (Blank rows = not tested)  All tested in sitting   BED MOBILITY:  Pt reporting no difficulties, performing the log roll   TRANSFERS: Sit to stand: SBA  Assistive device utilized: None     Stand to sit: SBA  Assistive device utilized: None      Able to perform with no UE support, wide BOS with LLE staggered behind R  Pt was very pleased with this, did not realize  he could perform without his hands   GAIT: Findings: Gait Characteristics: step through pattern, decreased stride length, and narrow BOS, Distance walked: Clinic distances, Assistive device utilized:Single point cane and Walker - 2 wheeled, Level of assistance: Modified independence, SBA, and CGA, and Comments: mod I with RW, SBA/CGA with SPC   FUNCTIONAL TESTS:  5 times sit to stand: 17.8 seconds with no UE support  Timed up and go (TUG): 16.5 seconds with RW, 20.1 seconds with SPC  10 meter walk test: 17.8 seconds with RW = 1.84 ft/sec                                                                                                                               TREATMENT DATE: 03/02/24  Therapeutic Activity:  Vitals:   03/02/24 0809  BP: 125/86  Pulse: 97   Assessed BP and WNL for therapy.    SciFit with BLE/BUE at Gear 5.0 > 6.0 [place] for 8 minutes for strength,activity tolerance/endurance. Pt reports RPE as 7/10. HR after at 130 bpm  NMR:  On air ex: With slight space between feet: EC 4 x 20 -30 seconds with mild postural sway, intermittent taps to bars for balance  With wide BOS > feet hip width EC 2 x 10 reps head turns, 2 x 10 reps head nods Alternating forward cone taps 10 reps each side, then forward/cross body cone  tap 10 reps each side for incr SLS time, balance improved with incr reps, needing intermittent UE support  On black side of BOSU: Weight shifting laterally 10 reps, then in A/P direction 10 reps  10 reps mini squats, an additional 10 reps with weight shifting onto toes after  Pt able to perform all without UE support   PATIENT EDUCATION: Education details: continue HEP, will start aquatic therapy next week  Person educated: Patient Education method: Explanation, Demonstration, and Verbal cues Education comprehension: verbalized understanding and returned demonstration  HOME EXERCISE PROGRAM:  Access Code: [email or web address] URL: [email or web address] Date: 02/15/2024 Prepared by: Sheffield Senate  Exercises - Supine Hamstring Stretch with Strap  - 1 x daily - 7 x weekly - 3 sets - 10 reps - Supine Heel Slide with Strap  - 1 x daily - 7 x weekly - 3 sets - 10 reps - Supine 90/90 Alternating Toe Touch  - 1 x daily - 7 x weekly - 3 sets - 10 reps - Dead Bug  - 1 x daily - 7 x weekly - 3 sets - 10 reps - Mini Squats with Walker and Chair  - 1 x daily - 7 x weekly - 4 sets - 10 reps - Side Stepping with Resistance at Ankles and Counter Support  - 1 x daily - 7 x weekly - 3 sets - 10 reps - Forward and Backward Monster Walk with Counter Support  - 1 x daily - 7 x weekly - 3 sets - 10 reps - Supine Figure 4  Piriformis Stretch  - 1 x daily - 7 x weekly - 2 sets - 30 hold - Modified Thomas Stretch  - 1 x daily - 7 x weekly - 3 sets - 60 hold - Sidelying Hip Abduction  - 1 x daily - 5 x weekly - 2 sets - 10 reps - Single Leg Bridge  - 1 x daily - 5 x weekly - 1 sets - 10 reps  FOR AQUATIC USE: Access Code: [email or web address] URL: [email or web address] Date: 02/02/2024 Prepared by: Daved Bull  Exercises - Push-Up on Pool Wall  - 1 x daily - 7 x weekly - 3 sets - 10 reps - Forward and Backward Stepping at [employer]  - 1 x daily - 7 x weekly - 3 sets - 10 reps -  Asymmetrical Shoulder Flexion Extension with Hand Floats  - 1 x daily - 7 x weekly - 3 sets - 10 reps - Heel Toe Raises at Pool Wall  - 1 x daily - 7 x weekly - 3 sets - 10 reps - Lateral Stepping at Pool Wall  - 1 x daily - 7 x weekly - 3 sets - 10 reps - Standing Hip Circles at [employer]  - 1 x daily - 7 x weekly - 3 sets - 10 reps - Lunge to Target at [employer]  - 1 x daily - 7 x weekly - 3 sets - 10 reps - Plank with Hip Extension at [employer]  - 1 x daily - 7 x weekly - 3 sets - 10 reps - Standing 3-Way Kick with Ankle Float at [employer]  - 1 x daily - 7 x weekly - 3 sets - 10 reps - Sitting Balance on Pool Noodle  - 1 x daily - 7 x weekly - 3 sets - 10 reps   GOALS: Goals reviewed with patient? Yes  SHORT TERM GOALS: Target date: 01/31/2024  Pt will be independent with initial HEP in order to build upon functional gains made in therapy. Baseline:  IND (9/3) Goal status: MET  2.  Pt will improve gait speed with LRAD to at least 2.2 ft/sec in order to demo improved community mobility.   Baseline: 17.8 seconds with RW = 1.84 ft/sec; 2.90 ft/sec w/ rollator (9/3) Goal status: MET  3.  Pt will improve TUG time to 17 seconds or less with SPC in order to demo decrease fall risk.  Baseline: 20.1 seconds with SPC; 16.29 sec w/ SPC SBA (9/3) Goal status: MET   LONG TERM GOALS: Target date: 02/21/2024  Pt will be independent with initial HEP in order to build upon functional gains made in therapy. Baseline: pt has been consistently performing HEP  Goal status: MET  2. Pt will improve BERG to at least a 46/56 in order to demo decr fall risk.  Baseline: 38/56  51/56 (9/30) Goal status: MET  3.   Pt will improve TUG time to 13.5 seconds or less with SPC/no AD in order to demo decrease fall risk. Baseline:  20.1 seconds with SPC; 16.29 sec w/ SPC SBA (9/3)  14.6 seconds with SPC (9/30) Goal status: NOT MET  4.  Pt will improve gait speed with LRAD to at least 3.1 ft/sec in order  to demo improved community mobility.  Baseline: 17.8 seconds with RW = 1.84 ft/sec; 2.90 ft/sec w/ rollator (9/3)  13.9 seconds with SPC = 2.34 ft/sec  12.1 seconds with rollator = 2.1 ft/sec Goal status: NOT MET  5.  Pt will improve 5x sit<>stand to less than or equal to 14 sec to demonstrate improved functional strength and transfer efficiency.  Baseline: 17.8 seconds with no UE support  12.1 seconds with no UE support (9/30) Goal status: MET  6.  Pt will ambulate at least 500' outdoors on unlevel surfaces with LRAD in order to demo improved community mobility.  Baseline: unable to check during session due to rainy weather  Goal status: N/A  UPDATED/REVISED GOALS FOR RE-CERT:  LONG TERM GOALS: Target date: 03/28/2024  Pt will be independent with final HEP for land/aquatic therapy in order to build upon functional gains made in therapy. Baseline: pt has been consistently performing land HEP, will benefit from further updates/revisions  Goal status: REVISED  2. Pt will improve FGA to at least a 21/30 in order to demo decr fall risk. Baseline: 16/30 Goal status: NEW  3.   Pt will improve TUG time to 13.5 seconds or less with SPC/no AD in order to demo decrease fall risk. Baseline:  20.1 seconds with SPC; 16.29 sec w/ SPC SBA (9/3)  14.6 seconds with SPC (9/30) Goal status: ON-GOING  4.  Pt will improve gait speed with LRAD to at least 2.6 ft/sec in order to demo improved community mobility.  Baseline: 17.8 seconds with RW = 1.84 ft/sec; 2.90 ft/sec w/ rollator (9/3)  13.9 seconds with SPC = 2.34 ft/sec  12.1 seconds with rollator = 2.1 ft/sec Goal status: REVISED     ASSESSMENT:  CLINICAL IMPRESSION: Pt's vitals WNL for therapy. Today's skilled session focused on BLE strengthening and standing balance on compliant surfaces. Pt challenged by balance for vestibular input with EC, initially had more postural sway, but improved with incr reps. Pt will get his heart monitor  off on Tuesday and will start aquatics next Thursday. Will continue per POC.   OBJECTIVE IMPAIRMENTS: Abnormal gait, decreased activity tolerance, decreased balance, decreased coordination, decreased mobility, difficulty walking, decreased ROM, decreased strength, impaired flexibility, impaired sensation, postural dysfunction, and pain.   ACTIVITY LIMITATIONS: carrying, lifting, bending, stairs, transfers, and locomotion level  PARTICIPATION LIMITATIONS: shopping, community activity, occupation, and yard work  PERSONAL FACTORS: Behavior pattern, Past/current experiences, Time since onset of injury/illness/exacerbation, and 3+ comorbidities: L knee arthritis, HLD, HTN, L knee replacement 2022 are also affecting patient's functional outcome.   REHAB POTENTIAL: Good  CLINICAL DECISION MAKING: Evolving/moderate complexity  EVALUATION COMPLEXITY: Moderate  PLAN:  PT FREQUENCY: 2x/week  PT DURATION: 8 weeks plus an additional 2x week for 4 weeks per re-cert   PLANNED INTERVENTIONS: [id number]- PT Re-evaluation, 97110-Therapeutic exercises, 97530- Therapeutic activity, 97112- Neuromuscular re-education, 97535- Self Care, [id number]- Manual therapy, [phone]- Gait training, [phone]- Aquatic Therapy, Patient/Family education, Balance training, Stair training, and DME instructions  PLAN FOR NEXT SESSION:  gait with cane, BLE strength esp LLE- SLS tasks, dynamic stability work, resisted gait/step outs, lunge and squat variations; floor recovery. Try quadruped exercises - work on shoulder stability, modified plantigrade!  Aquatics: He has aquatic HEP that can be reviewed. Work on gait, balance, functional strength    Sheffield LOISE Senate, PT, DPT 03/02/2024, 8:47 AM

## 2024-03-07 ENCOUNTER — Ambulatory Visit: Admitting: Physical Therapy

## 2024-03-07 ENCOUNTER — Encounter: Payer: Self-pay | Admitting: Physical Therapy

## 2024-03-07 VITALS — BP 129/80 | HR 113

## 2024-03-07 DIAGNOSIS — R278 Other lack of coordination: Secondary | ICD-10-CM | POA: Diagnosis not present

## 2024-03-07 DIAGNOSIS — M4714 Other spondylosis with myelopathy, thoracic region: Secondary | ICD-10-CM | POA: Diagnosis not present

## 2024-03-07 DIAGNOSIS — M546 Pain in thoracic spine: Secondary | ICD-10-CM | POA: Diagnosis not present

## 2024-03-07 DIAGNOSIS — R2681 Unsteadiness on feet: Secondary | ICD-10-CM

## 2024-03-07 DIAGNOSIS — M6281 Muscle weakness (generalized): Secondary | ICD-10-CM

## 2024-03-07 DIAGNOSIS — R2689 Other abnormalities of gait and mobility: Secondary | ICD-10-CM

## 2024-03-07 NOTE — Therapy (Signed)
 OUTPATIENT PHYSICAL THERAPY NEURO TREATMENT   Patient Name: Corey [name] MRN: [id number] DOB:1965/10/28, 58 y.o., male Today's Date: 03/07/2024   PCP: Joyce Norleen BROCKS, MD REFERRING PROVIDER: Pegge Toribio PARAS, PA-C  END OF SESSION:  PT End of Session - 03/07/24 0933     Visit Number 15    Number of Visits 21    Date for Recertification  03/30/24   per re-cert on 0/69/74   Authorization Type [employer]    PT Start Time 0932    PT Stop Time 1011    PT Time Calculation (min) 39 min    Equipment Utilized During Treatment Gait belt    Activity Tolerance Patient tolerated treatment well    Behavior During Therapy WFL for tasks assessed/performed          Past Medical History:  Diagnosis Date   Arthritis    knee, left   ED (erectile dysfunction)    Hyperlipidemia    Hypertension    Sleep apnea    CPAP - not using lately due to recall    Past Surgical History:  Procedure Laterality Date   ANKLE BONE SURGERY     APPLICATION OF INTRAOPERATIVE CT SCAN  12/07/2023   Procedure: APPLICATION OF INTRAOPERATIVE CT SCAN;  Surgeon: Claudene Penne ORN, MD;  Location: [facility];  Service: Neurosurgery;;   BACK SURGERY     COLLAR BONE SURGEY     COLONOSCOPY     FRACTURE SURGERY     HERNIA REPAIR     IR IVC FILTER PLMT / S&I /IMG GUID/MOD SED  12/12/2023   KNEE ARTHROSCOPY Left 2006   LAPAROSCOPIC APPENDECTOMY N/A 01/13/2021   Procedure: APPENDECTOMY LAPAROSCOPIC WITH LYSIS OF ADHESIONS;  Surgeon: Sheldon Standing, MD;  Location: [facility];  Service: General;  Laterality: N/A;   LUMBAR MICRODISCECTOMY     POLYPECTOMY     SKIN BIOPSY Left 08/11/2021   nodulocystic fat nercrosis   TOTAL KNEE ARTHROPLASTY Left 11/11/2020   Procedure: LEFT TOTAL KNEE ARTHROPLASTY;  Surgeon: Barbarann Oneil BROCKS, MD;  Location: [facility];  Service: Orthopedics;  Laterality: Left;   UMBILICAL HERNIA REPAIR     Patient Active Problem List   Diagnosis Date Noted   Anxiety 01/12/2024   Chronic low back pain  01/12/2024   Acute embolism and thrombosis of unspecified deep veins of unspecified lower extremity (HCC) 01/12/2024   Coping style affecting medical condition 12/15/2023   Myelopathy concurrent with and due to spinal stenosis of thoracic region [facility]) 12/06/2023   Spinal stenosis 12/06/2023   Thoracic myelopathy 12/06/2023   Morbid obesity (HCC) 12/06/2023   Depression 12/06/2023   Radiculopathy, lumbar region 11/10/2023   Flexion contracture of knee, left 03/01/2023   Prediabetes 11/19/2022   Status post laparoscopic appendectomy 01/14/2021   H/O total knee replacement, left 11/11/2020   Leiomyoma 11/08/2020   Arthritis 09/26/2018   Onychomycosis 09/26/2018   Hyperlipidemia LDL goal <100 08/31/2016   OSA on CPAP 08/31/2016   Premature ejaculation 08/19/2011    ONSET DATE: 12/16/2023  REFERRING DIAG: M47.14 (ICD-10-CM) - Thoracic myelopathy  THERAPY DIAG:  Muscle weakness (generalized)  Unsteadiness on feet  Other abnormalities of gait and mobility  Rationale for Evaluation and Treatment: Rehabilitation  SUBJECTIVE:  SUBJECTIVE STATEMENT:   Has been using his rollator for community distances and using no AD in the house and not having to grab onto the walls. No falls. Took off his heart monitor this morning and mailed it in.   Pt accompanied by: self, spouse  PERTINENT HISTORY: Status post T3 transpedicular decompression, T1-T4 PSF 12/07/2023 due to severe thoracic myelopathy with progressive ambulatory functional loss per Dr. Penne Sharps neurosurgery.NO BRACE REQUIRED. Continues to have some pain in his mid back up to his neck. He does feel as though the weakness in his legs is improving however he continues to use a rollator. No new weakness, numbness or tingling.Of note his operative  course was complicated after shown to have a DVT postoperatively on day 3. He continues to take Eliquis  for this   Got inpatient rehab and was discharged home 12/20/23  PMH: L knee arthritis, HLD, HTN, L knee replacement 2022  PAIN:  Are you having pain? No, just having some tightness   LUE in sitting: Vitals:   03/07/24 0936  BP: 129/80  Pulse: (!) 113     PRECAUTIONS: Back  Precaution/Restrictions Comments: spinal precautions, no brace needed per orders - spinal precautions lifted at 8/13 visit - can lift up to 25 lbs, can bend and twist to tolerance  FALLS: Has patient fallen in last 6 months? Yes. Number of falls 20, did not injure himself, legs were weak and didn't have strength in his legs  LIVING ENVIRONMENT: Lives with: lives with their spouse Lives in: House/apartment Stairs: Yes: External: 2 steps; none Has following equipment at home: Single point cane, Walker - 2 wheeled, Environmental consultant - 4 wheeled, shower chair, and raised grab bars  PLOF: Independent, Vocation/Vocational requirements: Estate agent , and Leisure: Working out, taking walks   PATIENT GOALS: Wants to get back to being independent, getting his strength back and walking on his own   OBJECTIVE:  Note: Objective measures were completed at Evaluation unless otherwise noted.  COGNITION: Overall cognitive status: Within functional limits for tasks assessed   SENSATION: Light touch: WFL Proprioception: WFL and with ankle DF/PF, noted some bouts of clonus bilat when testing into ankle DF   Pt reporting numbness/tingling in L knee and both feet have numbness/tingling and coldness  Sometimes if he rubs up against something will feel warmth   COORDINATION: Heel to shin: slightly more difficult with LLE    POSTURE: No Significant postural limitations  LOWER EXTREMITY ROM:    Pt with limited L knee extension/flexion ROM due to hx of L knee replacement   LOWER EXTREMITY MMT:    MMT Right Eval  Left Eval  Hip flexion 4+ 4-  Hip extension    Hip abduction 4+ 4+  Hip adduction 5 5  Hip internal rotation    Hip external rotation    Knee flexion 5 4+  Knee extension 4+ 4-  Ankle dorsiflexion 5 5  Ankle plantarflexion    Ankle inversion    Ankle eversion    (Blank rows = not tested)  All tested in sitting   BED MOBILITY:  Pt reporting no difficulties, performing the log roll   TRANSFERS: Sit to stand: SBA  Assistive device utilized: None     Stand to sit: SBA  Assistive device utilized: None      Able to perform with no UE support, wide BOS with LLE staggered behind R  Pt was very pleased with this, did not realize he could perform without his hands  GAIT: Findings: Gait Characteristics: step through pattern, decreased stride length, and narrow BOS, Distance walked: Clinic distances, Assistive device utilized:Single point cane and Walker - 2 wheeled, Level of assistance: Modified independence, SBA, and CGA, and Comments: mod I with RW, SBA/CGA with SPC   FUNCTIONAL TESTS:  5 times sit to stand: 17.8 seconds with no UE support  Timed up and go (TUG): 16.5 seconds with RW, 20.1 seconds with SPC  10 meter walk test: 17.8 seconds with RW = 1.84 ft/sec                                                                                                                               TREATMENT DATE: 03/07/24  Therapeutic Activity:  Vitals:   03/07/24 0936  BP: 129/80  Pulse: (!) 113    Assessed BP and WNL for therapy.  HR slightly elevated at rest at start of session  After activity at end of session HR was at 145 bpm, HR able to decr after seated rest break   In quadruped position on mat table: Alternating hip extension 5 reps each side Then performed bird dogs for core stability/balance, 2 sets of 5 reps each side, pt more imbalanced when shifting weight onto L side, CGA/min A for balance mainly during 2nd set when more fatigued  Clamshells/fire hydrants 3 x 5 reps each  side for hip ABD strengthening, cues to try not to lean   Ambulated small clinic distances with no AD and SBA   NMR:   On blue foam beam: Tandem gait down and back x4 reps, ale to decr UE support with incr reps  Side stepping down and back x4 reps with tossing 6# medicine ball to floor and catching it, progressing to side step and bringing feet together  Alternating legs and performing soccer ball rolls 5 reps to work on SLS stability, performed 4 sets each side, pt initially more unsteady on LLE, but improved with incr reps  On rockerboard in A/P direction: Alternating step ups with contralateral march 2 x 10 reps each side for dynamic SLS/strength, trying to perform with no UE support    PATIENT EDUCATION: Education details: continue HEP, starting aquatic therapy at next session  Person educated: Patient, spouse  Education method: Explanation, Demonstration, and Verbal cues Education comprehension: verbalized understanding and returned demonstration  HOME EXERCISE PROGRAM:  Access Code: [email or web address] URL: [email or web address] Date: 02/15/2024 Prepared by: Sheffield Senate  Exercises - Supine Hamstring Stretch with Strap  - 1 x daily - 7 x weekly - 3 sets - 10 reps - Supine Heel Slide with Strap  - 1 x daily - 7 x weekly - 3 sets - 10 reps - Supine 90/90 Alternating Toe Touch  - 1 x daily - 7 x weekly - 3 sets - 10 reps - Dead Bug  - 1 x daily - 7 x weekly - 3 sets - 10 reps - Mini Squats with Environmental consultant and Chair  -  1 x daily - 7 x weekly - 4 sets - 10 reps - Side Stepping with Resistance at Ankles and Counter Support  - 1 x daily - 7 x weekly - 3 sets - 10 reps - Forward and Backward Monster Walk with Counter Support  - 1 x daily - 7 x weekly - 3 sets - 10 reps - Supine Figure 4 Piriformis Stretch  - 1 x daily - 7 x weekly - 2 sets - 30 hold - Modified Thomas Stretch  - 1 x daily - 7 x weekly - 3 sets - 60 hold - Sidelying Hip Abduction  - 1 x daily - 5 x weekly - 2 sets  - 10 reps - Single Leg Bridge  - 1 x daily - 5 x weekly - 1 sets - 10 reps  FOR AQUATIC USE: Access Code: [email or web address] URL: [email or web address] Date: 02/02/2024 Prepared by: Daved Bull  Exercises - Push-Up on Pool Wall  - 1 x daily - 7 x weekly - 3 sets - 10 reps - Forward and Backward Stepping at [employer]  - 1 x daily - 7 x weekly - 3 sets - 10 reps - Asymmetrical Shoulder Flexion Extension with Hand Floats  - 1 x daily - 7 x weekly - 3 sets - 10 reps - Heel Toe Raises at Pool Wall  - 1 x daily - 7 x weekly - 3 sets - 10 reps - Lateral Stepping at Pool Wall  - 1 x daily - 7 x weekly - 3 sets - 10 reps - Standing Hip Circles at [employer]  - 1 x daily - 7 x weekly - 3 sets - 10 reps - Lunge to Target at [employer]  - 1 x daily - 7 x weekly - 3 sets - 10 reps - Plank with Hip Extension at [employer]  - 1 x daily - 7 x weekly - 3 sets - 10 reps - Standing 3-Way Kick with Ankle Float at [employer]  - 1 x daily - 7 x weekly - 3 sets - 10 reps - Sitting Balance on Pool Noodle  - 1 x daily - 7 x weekly - 3 sets - 10 reps   GOALS: Goals reviewed with patient? Yes  SHORT TERM GOALS: Target date: 01/31/2024  Pt will be independent with initial HEP in order to build upon functional gains made in therapy. Baseline:  IND (9/3) Goal status: MET  2.  Pt will improve gait speed with LRAD to at least 2.2 ft/sec in order to demo improved community mobility.   Baseline: 17.8 seconds with RW = 1.84 ft/sec; 2.90 ft/sec w/ rollator (9/3) Goal status: MET  3.  Pt will improve TUG time to 17 seconds or less with SPC in order to demo decrease fall risk.  Baseline: 20.1 seconds with SPC; 16.29 sec w/ SPC SBA (9/3) Goal status: MET   LONG TERM GOALS: Target date: 02/21/2024  Pt will be independent with initial HEP in order to build upon functional gains made in therapy. Baseline: pt has been consistently performing HEP  Goal status: MET  2. Pt will improve BERG to at least a 46/56  in order to demo decr fall risk.  Baseline: 38/56  51/56 (9/30) Goal status: MET  3.   Pt will improve TUG time to 13.5 seconds or less with SPC/no AD in order to demo decrease fall risk. Baseline:  20.1 seconds with SPC; 16.29 sec w/ SPC SBA (9/3)  14.6  seconds with SPC (9/30) Goal status: NOT MET  4.  Pt will improve gait speed with LRAD to at least 3.1 ft/sec in order to demo improved community mobility.  Baseline: 17.8 seconds with RW = 1.84 ft/sec; 2.90 ft/sec w/ rollator (9/3)  13.9 seconds with SPC = 2.34 ft/sec  12.1 seconds with rollator = 2.1 ft/sec Goal status: NOT MET  5.  Pt will improve 5x sit<>stand to less than or equal to 14 sec to demonstrate improved functional strength and transfer efficiency.  Baseline: 17.8 seconds with no UE support  12.1 seconds with no UE support (9/30) Goal status: MET  6.  Pt will ambulate at least 500' outdoors on unlevel surfaces with LRAD in order to demo improved community mobility.  Baseline: unable to check during session due to rainy weather  Goal status: N/A  UPDATED/REVISED GOALS FOR RE-CERT:  LONG TERM GOALS: Target date: 03/28/2024  Pt will be independent with final HEP for land/aquatic therapy in order to build upon functional gains made in therapy. Baseline: pt has been consistently performing land HEP, will benefit from further updates/revisions  Goal status: REVISED  2. Pt will improve FGA to at least a 21/30 in order to demo decr fall risk. Baseline: 16/30 Goal status: NEW  3.   Pt will improve TUG time to 13.5 seconds or less with SPC/no AD in order to demo decrease fall risk. Baseline:  20.1 seconds with SPC; 16.29 sec w/ SPC SBA (9/3)  14.6 seconds with SPC (9/30) Goal status: ON-GOING  4.  Pt will improve gait speed with LRAD to at least 2.6 ft/sec in order to demo improved community mobility.  Baseline: 17.8 seconds with RW = 1.84 ft/sec; 2.90 ft/sec w/ rollator (9/3)  13.9 seconds with SPC = 2.34  ft/sec  12.1 seconds with rollator = 2.1 ft/sec Goal status: REVISED     ASSESSMENT:  CLINICAL IMPRESSION: Pt arrived to session with rollator, but performed gait during with no AD. Pt with no episodes of knee buckling. Today's skilled session focused on quadruped exercises for core stability/balance and standing balance tasks working on decr UE support and on compliant surfaces/SLS. Pt able to perform bird dogs today (was unable to do so a couple weeks ago), but did have more difficulty when having to shift more weight to L side. Pt to start aquatic therapy this Thursday.  Will continue per POC.   OBJECTIVE IMPAIRMENTS: Abnormal gait, decreased activity tolerance, decreased balance, decreased coordination, decreased mobility, difficulty walking, decreased ROM, decreased strength, impaired flexibility, impaired sensation, postural dysfunction, and pain.   ACTIVITY LIMITATIONS: carrying, lifting, bending, stairs, transfers, and locomotion level  PARTICIPATION LIMITATIONS: shopping, community activity, occupation, and yard work  PERSONAL FACTORS: Behavior pattern, Past/current experiences, Time since onset of injury/illness/exacerbation, and 3+ comorbidities: L knee arthritis, HLD, HTN, L knee replacement 2022 are also affecting patient's functional outcome.   REHAB POTENTIAL: Good  CLINICAL DECISION MAKING: Evolving/moderate complexity  EVALUATION COMPLEXITY: Moderate  PLAN:  PT FREQUENCY: 2x/week  PT DURATION: 8 weeks plus an additional 2x week for 4 weeks per re-cert   PLANNED INTERVENTIONS: [id number]- PT Re-evaluation, 97110-Therapeutic exercises, 97530- Therapeutic activity, 97112- Neuromuscular re-education, 97535- Self Care, [id number]- Manual therapy, [phone]- Gait training, [phone]- Aquatic Therapy, Patient/Family education, Balance training, Stair training, and DME instructions  PLAN FOR NEXT SESSION:  BLE strength esp LLE- SLS tasks, dynamic stability work, resisted gait/step outs,  lunge and squat variations; floor recovery. Continue quadruped exercises   Aquatics: He has aquatic HEP that  can be reviewed. Work on gait, balance, functional strength    Sheffield LOISE Senate, PT, DPT 03/07/2024, 10:18 AM

## 2024-03-09 ENCOUNTER — Ambulatory Visit: Admitting: Physical Therapy

## 2024-03-09 ENCOUNTER — Encounter: Payer: Self-pay | Admitting: Physical Therapy

## 2024-03-09 DIAGNOSIS — M546 Pain in thoracic spine: Secondary | ICD-10-CM | POA: Diagnosis not present

## 2024-03-09 DIAGNOSIS — R278 Other lack of coordination: Secondary | ICD-10-CM | POA: Diagnosis not present

## 2024-03-09 DIAGNOSIS — R2681 Unsteadiness on feet: Secondary | ICD-10-CM

## 2024-03-09 DIAGNOSIS — R2689 Other abnormalities of gait and mobility: Secondary | ICD-10-CM

## 2024-03-09 DIAGNOSIS — M6281 Muscle weakness (generalized): Secondary | ICD-10-CM

## 2024-03-09 DIAGNOSIS — M4714 Other spondylosis with myelopathy, thoracic region: Secondary | ICD-10-CM | POA: Diagnosis not present

## 2024-03-09 NOTE — Therapy (Signed)
 OUTPATIENT PHYSICAL THERAPY NEURO TREATMENT   Patient Name: Corey [name] MRN: [id number] DOB:07-22-1965, 58 y.o., male Today's Date: 03/09/2024   PCP: Joyce Norleen BROCKS, MD REFERRING PROVIDER: Pegge Toribio PARAS, PA-C  END OF SESSION:  PT End of Session - 03/09/24 1021     Visit Number 16    Number of Visits 21    Date for Recertification  03/30/24   per re-cert on 0/69/74   Authorization Type [employer]    PT Start Time 1015    PT Stop Time 1100    PT Time Calculation (min) 45 min    Equipment Utilized During Treatment Other (comment)   yellow moderate and blue high resistance aquatic dumbbells, 5.5lb ankle wts   Activity Tolerance Patient tolerated treatment well    Behavior During Therapy WFL for tasks assessed/performed          Past Medical History:  Diagnosis Date   Arthritis    knee, left   ED (erectile dysfunction)    Hyperlipidemia    Hypertension    Sleep apnea    CPAP - not using lately due to recall    Past Surgical History:  Procedure Laterality Date   ANKLE BONE SURGERY     APPLICATION OF INTRAOPERATIVE CT SCAN  12/07/2023   Procedure: APPLICATION OF INTRAOPERATIVE CT SCAN;  Surgeon: Claudene Penne ORN, MD;  Location: [facility] ORS;  Service: Neurosurgery;;   BACK SURGERY     COLLAR BONE SURGEY     COLONOSCOPY     FRACTURE SURGERY     HERNIA REPAIR     IR IVC FILTER PLMT / S&I /IMG GUID/MOD SED  12/12/2023   KNEE ARTHROSCOPY Left 2006   LAPAROSCOPIC APPENDECTOMY N/A 01/13/2021   Procedure: APPENDECTOMY LAPAROSCOPIC WITH LYSIS OF ADHESIONS;  Surgeon: Sheldon Standing, MD;  Location: [facility];  Service: General;  Laterality: N/A;   LUMBAR MICRODISCECTOMY     POLYPECTOMY     SKIN BIOPSY Left 08/11/2021   nodulocystic fat nercrosis   TOTAL KNEE ARTHROPLASTY Left 11/11/2020   Procedure: LEFT TOTAL KNEE ARTHROPLASTY;  Surgeon: Barbarann Oneil BROCKS, MD;  Location: [facility] OR;  Service: Orthopedics;  Laterality: Left;   UMBILICAL HERNIA REPAIR     Patient Active  Problem List   Diagnosis Date Noted   Anxiety 01/12/2024   Chronic low back pain 01/12/2024   Acute embolism and thrombosis of unspecified deep veins of unspecified lower extremity (HCC) 01/12/2024   Coping style affecting medical condition 12/15/2023   Myelopathy concurrent with and due to spinal stenosis of thoracic region [facility]) 12/06/2023   Spinal stenosis 12/06/2023   Thoracic myelopathy 12/06/2023   Morbid obesity (HCC) 12/06/2023   Depression 12/06/2023   Radiculopathy, lumbar region 11/10/2023   Flexion contracture of knee, left 03/01/2023   Prediabetes 11/19/2022   Status post laparoscopic appendectomy 01/14/2021   H/O total knee replacement, left 11/11/2020   Leiomyoma 11/08/2020   Arthritis 09/26/2018   Onychomycosis 09/26/2018   Hyperlipidemia LDL goal <100 08/31/2016   OSA on CPAP 08/31/2016   Premature ejaculation 08/19/2011    ONSET DATE: 12/16/2023  REFERRING DIAG: M47.14 (ICD-10-CM) - Thoracic myelopathy  THERAPY DIAG:  Muscle weakness (generalized)  Unsteadiness on feet  Other abnormalities of gait and mobility  Other lack of coordination  Pain in thoracic spine  Rationale for Evaluation and Treatment: Rehabilitation  SUBJECTIVE:  SUBJECTIVE STATEMENT:  He presents to [facility] alone using rollator. Denies falls or acute changes.  No pain today.  Pt accompanied by: self  PERTINENT HISTORY: Status post T3 transpedicular decompression, T1-T4 PSF 12/07/2023 due to severe thoracic myelopathy with progressive ambulatory functional loss per Dr. Penne Sharps neurosurgery.NO BRACE REQUIRED. Continues to have some pain in his mid back up to his neck. He does feel as though the weakness in his legs is improving however he continues to use a rollator. No new weakness, numbness or  tingling.Of note his operative course was complicated after shown to have a DVT postoperatively on day 3. He continues to take Eliquis  for this   Got inpatient rehab and was discharged home 12/20/23  PMH: L knee arthritis, HLD, HTN, L knee replacement 2022  PAIN:  Are you having pain? No  LUE in sitting: There were no vitals filed for this visit.    PRECAUTIONS: Back  Precaution/Restrictions Comments: spinal precautions, no brace needed per orders - spinal precautions lifted at 8/13 visit - can lift up to 25 lbs, can bend and twist to tolerance  FALLS: Has patient fallen in last 6 months? Yes. Number of falls 20, did not injure himself, legs were weak and didn't have strength in his legs  LIVING ENVIRONMENT: Lives with: lives with their spouse Lives in: House/apartment Stairs: Yes: External: 2 steps; none Has following equipment at home: Single point cane, Walker - 2 wheeled, Environmental consultant - 4 wheeled, shower chair, and raised grab bars  PLOF: Independent, Vocation/Vocational requirements: Estate agent , and Leisure: Working out, taking walks   PATIENT GOALS: Wants to get back to being independent, getting his strength back and walking on his own   OBJECTIVE:  Note: Objective measures were completed at Evaluation unless otherwise noted.  COGNITION: Overall cognitive status: Within functional limits for tasks assessed   SENSATION: Light touch: WFL Proprioception: WFL and with ankle DF/PF, noted some bouts of clonus bilat when testing into ankle DF   Pt reporting numbness/tingling in L knee and both feet have numbness/tingling and coldness  Sometimes if he rubs up against something will feel warmth   COORDINATION: Heel to shin: slightly more difficult with LLE    POSTURE: No Significant postural limitations  LOWER EXTREMITY ROM:    Pt with limited L knee extension/flexion ROM due to hx of L knee replacement   LOWER EXTREMITY MMT:    MMT Right Eval Left Eval  Hip  flexion 4+ 4-  Hip extension    Hip abduction 4+ 4+  Hip adduction 5 5  Hip internal rotation    Hip external rotation    Knee flexion 5 4+  Knee extension 4+ 4-  Ankle dorsiflexion 5 5  Ankle plantarflexion    Ankle inversion    Ankle eversion    (Blank rows = not tested)  All tested in sitting   BED MOBILITY:  Pt reporting no difficulties, performing the log roll   TRANSFERS: Sit to stand: SBA  Assistive device utilized: None     Stand to sit: SBA  Assistive device utilized: None      Able to perform with no UE support, wide BOS with LLE staggered behind R  Pt was very pleased with this, did not realize he could perform without his hands   GAIT: Findings: Gait Characteristics: step through pattern, decreased stride length, and narrow BOS, Distance walked: Clinic distances, Assistive device utilized:Single point cane and Walker - 2 wheeled, Level of assistance: Modified independence,  SBA, and CGA, and Comments: mod I with RW, SBA/CGA with SPC   FUNCTIONAL TESTS:  5 times sit to stand: 17.8 seconds with no UE support  Timed up and go (TUG): 16.5 seconds with RW, 20.1 seconds with SPC  10 meter walk test: 17.8 seconds with RW = 1.84 ft/sec                                                                                                                               TREATMENT DATE: 03/09/24 Aquatic therapy at [facility] - pool temperature 92 degrees   Patient seen for aquatic therapy today.  Treatment took place in water  3.6-4.8 feet deep depending upon activity.  Patient entered and exited the pool via stairs using reciprocal steps and bilateral rails at mod I level.   Exercises: Water  walking warmup 4x18 ft unsupported forward > backward > laterally - Push-Up on Pool Wall  x15 > push off in wide stance x15 > push off in narrow stance x15 - Asymmetrical Shoulder Flexion Extension with Hand Floats  (yellow mod and blue high resistance) 2x20 - Heel Toe Raises at Pool Wall  x20  > repeated at edge of step for increased eccentric demand - Standing Hip Circles at [facility] Wall  - 2x10 each LE anteriorly then posteriorly - Lunge to Target at Pool Wall  2x10 alternating LE - attempted free standing w/ some instability so repeated w/ aquatic barbell and modified ROM w/ good stability - Plank with Hip Extension at [employer] 2x20 2/ 5.5lb ankle wts - Standing 3-Way Kick with Ankle Weight (5.5lb) 2x10 all directions each LE - Sitting Balance on Pool Noodle several attempts working into deep water  and unsupported posture, able to hold for 3-5 seconds at a time prior to LOB  Patient requires buoyancy of the water  for support for reduced fall risk with gait training and balance exercises with SBA support. Exercises able to be performed safely in water  without the risk of fall compared to those same exercises performed on land; viscosity of water  needed for resistance for strengthening. Current of water  provides perturbations for challenging static and dynamic balance.     PATIENT EDUCATION: Education details: Continue HEP and try aquatic HEP when able, aquatic modifications including higher resistance/ankle weights/eccentric loading/decreased UE support/depth of water /depth of movement Person educated: Patient, spouse  Education method: Explanation, Demonstration, and Verbal cues Education comprehension: verbalized understanding and returned demonstration  HOME EXERCISE PROGRAM:  Access Code: [email or web address] URL: [email or web address] Date: 02/15/2024 Prepared by: Sheffield Senate  Exercises - Supine Hamstring Stretch with Strap  - 1 x daily - 7 x weekly - 3 sets - 10 reps - Supine Heel Slide with Strap  - 1 x daily - 7 x weekly - 3 sets - 10 reps - Supine 90/90 Alternating Toe Touch  - 1 x daily - 7 x weekly - 3 sets - 10 reps - Dead Bug  - 1 x daily - 7  x weekly - 3 sets - 10 reps - Mini Squats with Walker and Chair  - 1 x daily - 7 x weekly - 4 sets - 10 reps - Side  Stepping with Resistance at Ankles and Counter Support  - 1 x daily - 7 x weekly - 3 sets - 10 reps - Forward and Backward Monster Walk with Counter Support  - 1 x daily - 7 x weekly - 3 sets - 10 reps - Supine Figure 4 Piriformis Stretch  - 1 x daily - 7 x weekly - 2 sets - 30 hold - Modified Thomas Stretch  - 1 x daily - 7 x weekly - 3 sets - 60 hold - Sidelying Hip Abduction  - 1 x daily - 5 x weekly - 2 sets - 10 reps - Single Leg Bridge  - 1 x daily - 5 x weekly - 1 sets - 10 reps  FOR AQUATIC USE: Access Code: [email or web address] URL: [email or web address] Date: 02/02/2024 Prepared by: Daved Bull  Exercises - Push-Up on Pool Wall  - 1 x daily - 7 x weekly - 3 sets - 10 reps - Forward and Backward Stepping at [employer]  - 1 x daily - 7 x weekly - 3 sets - 10 reps - Asymmetrical Shoulder Flexion Extension with Hand Floats  - 1 x daily - 7 x weekly - 3 sets - 10 reps - Heel Toe Raises at Pool Wall  - 1 x daily - 7 x weekly - 3 sets - 10 reps - Lateral Stepping at Pool Wall  - 1 x daily - 7 x weekly - 3 sets - 10 reps - Standing Hip Circles at [employer]  - 1 x daily - 7 x weekly - 3 sets - 10 reps - Lunge to Target at [employer]  - 1 x daily - 7 x weekly - 3 sets - 10 reps - Plank with Hip Extension at [employer]  - 1 x daily - 7 x weekly - 3 sets - 10 reps - Standing 3-Way Kick with Ankle Float at [employer]  - 1 x daily - 7 x weekly - 3 sets - 10 reps - Sitting Balance on Pool Noodle  - 1 x daily - 7 x weekly - 3 sets - 10 reps   GOALS: Goals reviewed with patient? Yes  SHORT TERM GOALS: Target date: 01/31/2024  Pt will be independent with initial HEP in order to build upon functional gains made in therapy. Baseline:  IND (9/3) Goal status: MET  2.  Pt will improve gait speed with LRAD to at least 2.2 ft/sec in order to demo improved community mobility.   Baseline: 17.8 seconds with RW = 1.84 ft/sec; 2.90 ft/sec w/ rollator (9/3) Goal status: MET  3.  Pt will improve  TUG time to 17 seconds or less with SPC in order to demo decrease fall risk.  Baseline: 20.1 seconds with SPC; 16.29 sec w/ SPC SBA (9/3) Goal status: MET   LONG TERM GOALS: Target date: 02/21/2024  Pt will be independent with initial HEP in order to build upon functional gains made in therapy. Baseline: pt has been consistently performing HEP  Goal status: MET  2. Pt will improve BERG to at least a 46/56 in order to demo decr fall risk.  Baseline: 38/56  51/56 (9/30) Goal status: MET  3.   Pt will improve TUG time to 13.5 seconds or less with SPC/no AD in order to demo  decrease fall risk. Baseline:  20.1 seconds with SPC; 16.29 sec w/ SPC SBA (9/3)  14.6 seconds with SPC (9/30) Goal status: NOT MET  4.  Pt will improve gait speed with LRAD to at least 3.1 ft/sec in order to demo improved community mobility.  Baseline: 17.8 seconds with RW = 1.84 ft/sec; 2.90 ft/sec w/ rollator (9/3)  13.9 seconds with SPC = 2.34 ft/sec  12.1 seconds with rollator = 2.1 ft/sec Goal status: NOT MET  5.  Pt will improve 5x sit<>stand to less than or equal to 14 sec to demonstrate improved functional strength and transfer efficiency.  Baseline: 17.8 seconds with no UE support  12.1 seconds with no UE support (9/30) Goal status: MET  6.  Pt will ambulate at least 500' outdoors on unlevel surfaces with LRAD in order to demo improved community mobility.  Baseline: unable to check during session due to rainy weather  Goal status: N/A  UPDATED/REVISED GOALS FOR RE-CERT:  LONG TERM GOALS: Target date: 03/28/2024  Pt will be independent with final HEP for land/aquatic therapy in order to build upon functional gains made in therapy. Baseline: pt has been consistently performing land HEP, will benefit from further updates/revisions  Goal status: REVISED  2. Pt will improve FGA to at least a 21/30 in order to demo decr fall risk. Baseline: 16/30 Goal status: NEW  3.   Pt will improve TUG time  to 13.5 seconds or less with SPC/no AD in order to demo decrease fall risk. Baseline:  20.1 seconds with SPC; 16.29 sec w/ SPC SBA (9/3)  14.6 seconds with SPC (9/30) Goal status: ON-GOING  4.  Pt will improve gait speed with LRAD to at least 2.6 ft/sec in order to demo improved community mobility.  Baseline: 17.8 seconds with RW = 1.84 ft/sec; 2.90 ft/sec w/ rollator (9/3)  13.9 seconds with SPC = 2.34 ft/sec  12.1 seconds with rollator = 2.1 ft/sec Goal status: REVISED     ASSESSMENT:  CLINICAL IMPRESSION: Pt seen for aquatic session focused on aquatic HEP review as he has been unable to do this since previously requested.  He does well with all tasks and modifications provided to assist in progression of these tasks at home.  He continues to benefit from skilled PT intervention in this setting to improve dynamic stability strategies, core engagement, and activity tolerance.  Will continue per POC.   OBJECTIVE IMPAIRMENTS: Abnormal gait, decreased activity tolerance, decreased balance, decreased coordination, decreased mobility, difficulty walking, decreased ROM, decreased strength, impaired flexibility, impaired sensation, postural dysfunction, and pain.   ACTIVITY LIMITATIONS: carrying, lifting, bending, stairs, transfers, and locomotion level  PARTICIPATION LIMITATIONS: shopping, community activity, occupation, and yard work  PERSONAL FACTORS: Behavior pattern, Past/current experiences, Time since onset of injury/illness/exacerbation, and 3+ comorbidities: L knee arthritis, HLD, HTN, L knee replacement 2022 are also affecting patient's functional outcome.   REHAB POTENTIAL: Good  CLINICAL DECISION MAKING: Evolving/moderate complexity  EVALUATION COMPLEXITY: Moderate  PLAN:  PT FREQUENCY: 2x/week  PT DURATION: 8 weeks plus an additional 2x week for 4 weeks per re-cert   PLANNED INTERVENTIONS: [id number]- PT Re-evaluation, 97110-Therapeutic exercises, 97530- Therapeutic  activity, 97112- Neuromuscular re-education, 97535- Self Care, [id number]- Manual therapy, [phone]- Gait training, [phone]- Aquatic Therapy, Patient/Family education, Balance training, Stair training, and DME instructions  PLAN FOR NEXT SESSION:  BLE strength esp LLE- SLS tasks, dynamic stability work, resisted gait/step outs, lunge and squat variations; floor recovery. Continue quadruped exercises   Aquatics:  Work on gait, balance, functional  strength - falling star and modified warrior, unsupported step ups, squat variations, hinging w/ dumbbells, STS barbell push, water  jogging w/ resistance, resisted retro stepping at stair rail w/ water  band   Daved KATHEE Bull, PT, DPT 03/09/2024, 10:48 AM

## 2024-03-13 ENCOUNTER — Encounter: Payer: Self-pay | Admitting: Physical Medicine and Rehabilitation

## 2024-03-13 ENCOUNTER — Encounter: Attending: Registered Nurse | Admitting: Physical Medicine and Rehabilitation

## 2024-03-13 VITALS — BP 127/88 | HR 101 | Ht 69.0 in | Wt 240.2 lb

## 2024-03-13 DIAGNOSIS — R269 Unspecified abnormalities of gait and mobility: Secondary | ICD-10-CM | POA: Insufficient documentation

## 2024-03-13 DIAGNOSIS — I82403 Acute embolism and thrombosis of unspecified deep veins of lower extremity, bilateral: Secondary | ICD-10-CM | POA: Diagnosis not present

## 2024-03-13 DIAGNOSIS — M4714 Other spondylosis with myelopathy, thoracic region: Secondary | ICD-10-CM | POA: Diagnosis not present

## 2024-03-13 DIAGNOSIS — M62838 Other muscle spasm: Secondary | ICD-10-CM | POA: Diagnosis not present

## 2024-03-13 MED ORDER — BACLOFEN 10 MG PO TABS: 10.0000 mg | ORAL_TABLET | Freq: Four times a day (QID) | ORAL | 5 refills | Status: AC

## 2024-03-13 NOTE — Patient Instructions (Signed)
 Pt is a 58 yr old male with thoracic myelopathy. Status post T3 transpedicular decompression, T1-T4 PSF 12/07/2023 per Dr. Penne Sharps neurosurgery.He also has associated myelopathy, BL proximal peroneal vein DVT- has IVC filter; also has : OSA, HTN,  Here for f/u on Thoracic myelopathy  PCP can recheck you if need be for clots-  -had a provoked clot- and if PCP needs to do another Doppler, can do so.   2. Suggest asking Dr Sharps- Neurosurgeon for [facility] /disbility forms, but if he cannot do them, I thinks Disability with your deficits is appropriate and can do forms.  Definitely needs since fell x2 just 3 weeks ago  3.Will increase Baclofen  to 10  4x/day- For spasticity - since affecting gait- might make you feel slightly weaker, and if so, decrease to 5 mg 4x/day-  - can mild increase in constipation-   4. Off pain medicines- weren't making a bog difference, so stopped them.   5. No issues with bowel/bladder    6. F/U in 3 months- single myelopathy appt.

## 2024-03-13 NOTE — Progress Notes (Signed)
 Subjective:    Patient ID: Corey [name], male    DOB: [birth year], 58 y.o.   MRN: [id number]  HPI Pt is a 58 yr old male with thoracic myelopathy. Status post T3 transpedicular decompression, T1-T4 PSF 12/07/2023 per Dr. Penne Sharps neurosurgery.He also has associated myelopathy, R proximal peroneal vein DVT- has IVC filter; also has : OSA, HTN,  Here for f/u on Thoracic myelopathy   Thought he'd be doing better by now-   Spasticity- not as bad- 5 mg baclofen  QID-  Spasms aren't breaking through much.   Pain- more stiffness than pain-   Mid back mainly stiff- can be around midback- center, right or left, but still mid back-  Some tightness also in thighs- to prevents him for having a normal walk Still has swelling in L knee from TKR 3 years ago.   Still on Eliquis -  Has seen PCP-  3-6 months- also has IVC filter  Still doing therapy- Neurorehab [address]  2x/week and one of those are pool- [facility] for the pool therapy.   Still using Rolator- uses the cane in the house nad Rolator outside.  B/c knees will buckle.    Not sure about return to work.   Works- Paramedic- driving forklifts- and will try to apply for disability- - which I think is reasonable.   Using cane fell 3 weeks ago- no injuries but fell 2x/in 1 day-    Pain Inventory Average Pain 4 Pain Right Now 4 My pain is sharp, stabbing, and aching  In the last 24 hours, has pain interfered with the following? General activity 6 Relation with others 6 Enjoyment of life 6 What TIME of day is your pain at its worst? daytime Sleep (in general) Fair  Pain is worse with: bending, sitting, inactivity, standing, and some activites Pain improves with: rest and medication Relief from Meds: 7  Family History  Problem Relation Age of Onset   Alzheimer's disease Mother    Stevens-Johnson syndrome Father    Autism Brother    Colon cancer Other    Colon polyps Neg Hx    Esophageal cancer Neg Hx    Rectal  cancer Neg Hx    Stomach cancer Neg Hx    Social History   Socioeconomic History   Marital status: Married    Spouse name: Not on file   Number of children: 1   Years of education: Not on file   Highest education level: Some college, no degree  Occupational History   Occupation: Health and safety inspector  Tobacco Use   Smoking status: Never   Smokeless tobacco: Never  Vaping Use   Vaping status: Never Used  Substance and Sexual Activity   Alcohol use: Yes    Alcohol/week: 3.0 standard drinks of alcohol    Types: 3 Glasses of wine per week    Comment: occasional   Drug use: Not Currently    Types: Marijuana   Sexual activity: Yes    Partners: Female  Other Topics Concern   Not on file  Social History Narrative   Not on file   Social Drivers of Health   Financial Resource Strain: Low Risk  (02/28/2024)   Overall Financial Resource Strain (CARDIA)    Difficulty of Paying Living Expenses: Not hard at all  Food Insecurity: No Food Insecurity (02/28/2024)   Hunger Vital Sign    Worried About Running Out of Food in the Last Year: Never true    Ran Out of Food  in the Last Year: Never true  Transportation Needs: No Transportation Needs (02/28/2024)   PRAPARE - Administrator, Civil Service (Medical): No    Lack of Transportation (Non-Medical): No  Physical Activity: Insufficiently Active (02/28/2024)   Exercise Vital Sign    Days of Exercise per Week: 3 days    Minutes of Exercise per Session: 30 min  Stress: No Stress Concern Present (02/28/2024)   [employer] of Occupational Health - Occupational Stress Questionnaire    Feeling of Stress: Only a little  Social Connections: Socially Integrated (02/28/2024)   Social Connection and Isolation Panel    Frequency of Communication with Friends and Family: More than three times a week    Frequency of Social Gatherings with Friends and Family: More than three times a week    Attends Religious Services: More than 4 times per year     Active Member of Clubs or Organizations: Yes    Attends Engineer, structural: More than 4 times per year    Marital Status: Married   Past Surgical History:  Procedure Laterality Date   ANKLE BONE SURGERY     APPLICATION OF INTRAOPERATIVE CT SCAN  12/07/2023   Procedure: APPLICATION OF INTRAOPERATIVE CT SCAN;  Surgeon: Claudene Penne ORN, MD;  Location: [facility];  Service: Neurosurgery;;   BACK SURGERY     COLLAR BONE SURGEY     COLONOSCOPY     FRACTURE SURGERY     HERNIA REPAIR     IR IVC FILTER PLMT / S&I /IMG GUID/MOD SED  12/12/2023   KNEE ARTHROSCOPY Left 2006   LAPAROSCOPIC APPENDECTOMY N/A 01/13/2021   Procedure: APPENDECTOMY LAPAROSCOPIC WITH LYSIS OF ADHESIONS;  Surgeon: Sheldon Standing, MD;  Location: [facility];  Service: General;  Laterality: N/A;   LUMBAR MICRODISCECTOMY     POLYPECTOMY     SKIN BIOPSY Left 08/11/2021   nodulocystic fat nercrosis   TOTAL KNEE ARTHROPLASTY Left 11/11/2020   Procedure: LEFT TOTAL KNEE ARTHROPLASTY;  Surgeon: Barbarann Oneil BROCKS, MD;  Location: [facility];  Service: Orthopedics;  Laterality: Left;   UMBILICAL HERNIA REPAIR     Past Surgical History:  Procedure Laterality Date   ANKLE BONE SURGERY     APPLICATION OF INTRAOPERATIVE CT SCAN  12/07/2023   Procedure: APPLICATION OF INTRAOPERATIVE CT SCAN;  Surgeon: Claudene Penne ORN, MD;  Location: [facility];  Service: Neurosurgery;;   BACK SURGERY     COLLAR BONE SURGEY     COLONOSCOPY     FRACTURE SURGERY     HERNIA REPAIR     IR IVC FILTER PLMT / S&I /IMG GUID/MOD SED  12/12/2023   KNEE ARTHROSCOPY Left 2006   LAPAROSCOPIC APPENDECTOMY N/A 01/13/2021   Procedure: APPENDECTOMY LAPAROSCOPIC WITH LYSIS OF ADHESIONS;  Surgeon: Sheldon Standing, MD;  Location: [facility];  Service: General;  Laterality: N/A;   LUMBAR MICRODISCECTOMY     POLYPECTOMY     SKIN BIOPSY Left 08/11/2021   nodulocystic fat nercrosis   TOTAL KNEE ARTHROPLASTY Left 11/11/2020   Procedure: LEFT TOTAL KNEE ARTHROPLASTY;  Surgeon:  Barbarann Oneil BROCKS, MD;  Location: [facility];  Service: Orthopedics;  Laterality: Left;   UMBILICAL HERNIA REPAIR     Past Medical History:  Diagnosis Date   Arthritis    knee, left   ED (erectile dysfunction)    Hyperlipidemia    Hypertension    Sleep apnea    CPAP - not using lately due to recall    BP 127/88 (  BP Location: Left Arm, Patient Position: Sitting, Cuff Size: Large)   Pulse (!) 101   Ht 5' 9 (1.753 m)   Wt 240 lb 3.2 oz (109 kg)   SpO2 98%   BMI 35.47 kg/m   Opioid Risk Score:   Fall Risk Score:  `1  Depression screen [facility] 2/9     03/13/2024    9:02 AM 11/23/2023    2:36 PM 11/19/2022    1:31 PM 11/03/2021    8:24 AM 10/07/2020    8:35 AM 09/26/2018    8:20 AM 09/06/2017    8:29 AM  Depression screen PHQ 2/9  Decreased Interest 0 0 0 0 1 0 0  Down, Depressed, Hopeless 0 1 0 0 0    PHQ - 2 Score 0 1 0 0 1 0 0     Review of Systems  Musculoskeletal:  Positive for back pain.       Back pain  All other systems reviewed and are negative.      Objective:   Physical Exam  Awake, alert, appropriate, using Rolator, NAD MSK: RLE- HF 4+/5; KE 4+/5 and KF; DF/and PF 5-/5 LLE- HF 4/5; KE and KF 4+/5 and DF and PF 5-/5  Neuro: 3 beats clonus B/L MAS of 2 on RLE and 1+ to 2 on LLE throughout   Gait: Very stiff gait Weaker on LLE notable on gait as well- L knee buckled x1- was able to catch it      Assessment & Plan:   Pt is a 58 yr old male with thoracic myelopathy. Status post T3 transpedicular decompression, T1-T4 PSF 12/07/2023 per Dr. Penne Sharps neurosurgery.He also has associated myelopathy, BL proximal peroneal vein DVT- has IVC filter; also has : OSA, HTN,  Here for f/u on Thoracic myelopathy  PCP can recheck you if need be for clots-  -had a provoked clot- and if PCP needs to do another Doppler, can do so.   2. Suggest asking Dr Sharps- Neurosurgeon for [facility] /disbility forms, but if he cannot do them, I thinks Disability with your deficits is appropriate  and can do forms.  Definitely needs since fell x2 just 3 weeks ago  3.Will increase Baclofen  to 10  4x/day- For spasticity - since affecting gait- might make you feel slightly weaker, and if so, decrease to 5 mg 4x/day-  - can mild increase in constipation-   4. Off pain medicines- weren't making a bog difference, so stopped them.   5. No issues with bowel/bladder    6. F/U in 3 months- single myelopathy appt.    I spent a total of 30   minutes on total care today- >50% coordination of care- due to d/w pt about Disability, spasticity, pain and clots

## 2024-03-14 ENCOUNTER — Ambulatory Visit: Admitting: Physical Therapy

## 2024-03-14 VITALS — BP 134/84 | HR 87

## 2024-03-14 DIAGNOSIS — M6281 Muscle weakness (generalized): Secondary | ICD-10-CM | POA: Diagnosis not present

## 2024-03-14 DIAGNOSIS — R2689 Other abnormalities of gait and mobility: Secondary | ICD-10-CM

## 2024-03-14 DIAGNOSIS — R2681 Unsteadiness on feet: Secondary | ICD-10-CM

## 2024-03-14 DIAGNOSIS — R278 Other lack of coordination: Secondary | ICD-10-CM

## 2024-03-14 DIAGNOSIS — M546 Pain in thoracic spine: Secondary | ICD-10-CM | POA: Diagnosis not present

## 2024-03-14 DIAGNOSIS — M4714 Other spondylosis with myelopathy, thoracic region: Secondary | ICD-10-CM | POA: Diagnosis not present

## 2024-03-14 NOTE — Therapy (Addendum)
 OUTPATIENT PHYSICAL THERAPY NEURO TREATMENT   Patient Name: Corey [name] MRN: [id number] DOB:Jun 08, 1965, 58 y.o., male Today's Date: 03/14/2024   PCP: Joyce Norleen BROCKS, MD REFERRING PROVIDER: Pegge Toribio PARAS, PA-C  END OF SESSION:  PT End of Session - 03/14/24 0947     Visit Number 17    Number of Visits 21    Date for Recertification  03/30/24   per re-cert on 0/69/74   Authorization Type BLUE CROSS BLUE SHIELD    PT Start Time 0933    PT Stop Time 1014    PT Time Calculation (min) 41 min    Equipment Utilized During Treatment Other (comment);Gait belt    Activity Tolerance Patient tolerated treatment well    Behavior During Therapy WFL for tasks assessed/performed           Past Medical History:  Diagnosis Date   Arthritis    knee, left   ED (erectile dysfunction)    Hyperlipidemia    Hypertension    Sleep apnea    CPAP - not using lately due to recall    Past Surgical History:  Procedure Laterality Date   ANKLE BONE SURGERY     APPLICATION OF INTRAOPERATIVE CT SCAN  12/07/2023   Procedure: APPLICATION OF INTRAOPERATIVE CT SCAN;  Surgeon: Claudene Penne ORN, MD;  Location: [facility];  Service: Neurosurgery;;   BACK SURGERY     COLLAR BONE SURGEY     COLONOSCOPY     FRACTURE SURGERY     HERNIA REPAIR     IR IVC FILTER PLMT / S&I /IMG GUID/MOD SED  12/12/2023   KNEE ARTHROSCOPY Left 2006   LAPAROSCOPIC APPENDECTOMY N/A 01/13/2021   Procedure: APPENDECTOMY LAPAROSCOPIC WITH LYSIS OF ADHESIONS;  Surgeon: Sheldon Standing, MD;  Location: [facility];  Service: General;  Laterality: N/A;   LUMBAR MICRODISCECTOMY     POLYPECTOMY     SKIN BIOPSY Left 08/11/2021   nodulocystic fat nercrosis   TOTAL KNEE ARTHROPLASTY Left 11/11/2020   Procedure: LEFT TOTAL KNEE ARTHROPLASTY;  Surgeon: Barbarann Oneil BROCKS, MD;  Location: [facility];  Service: Orthopedics;  Laterality: Left;   UMBILICAL HERNIA REPAIR     Patient Active Problem List   Diagnosis Date Noted   Muscle spasticity  03/13/2024   Abnormality of gait 03/13/2024   Anxiety 01/12/2024   Chronic low back pain 01/12/2024   Acute embolism and thrombosis of unspecified deep veins of unspecified lower extremity (HCC) 01/12/2024   Coping style affecting medical condition 12/15/2023   Myelopathy concurrent with and due to spinal stenosis of thoracic region [facility]) 12/06/2023   Spinal stenosis 12/06/2023   Thoracic myelopathy 12/06/2023   Morbid obesity (HCC) 12/06/2023   Depression 12/06/2023   Radiculopathy, lumbar region 11/10/2023   Flexion contracture of knee, left 03/01/2023   Prediabetes 11/19/2022   Status post laparoscopic appendectomy 01/14/2021   H/O total knee replacement, left 11/11/2020   Leiomyoma 11/08/2020   Arthritis 09/26/2018   Onychomycosis 09/26/2018   Hyperlipidemia LDL goal <100 08/31/2016   OSA on CPAP 08/31/2016   Premature ejaculation 08/19/2011    ONSET DATE: 12/16/2023  REFERRING DIAG: M47.14 (ICD-10-CM) - Thoracic myelopathy  THERAPY DIAG:  Muscle weakness (generalized)  Unsteadiness on feet  Other lack of coordination  Other abnormalities of gait and mobility  Rationale for Evaluation and Treatment: Rehabilitation  SUBJECTIVE:  SUBJECTIVE STATEMENT:   Patient enters clinic with rollator. No falls since last visit. Has not received results from the heart monitor yet. He said that aquatic therapy went well and that he continues to go to the gym to lift some weights and do the cardio machine.   Pt accompanied by: self  PERTINENT HISTORY: Status post T3 transpedicular decompression, T1-T4 PSF 12/07/2023 due to severe thoracic myelopathy with progressive ambulatory functional loss per Dr. Penne Sharps neurosurgery.NO BRACE REQUIRED. Continues to have some pain in his mid back up to his  neck. He does feel as though the weakness in his legs is improving however he continues to use a rollator. No new weakness, numbness or tingling.Of note his operative course was complicated after shown to have a DVT postoperatively on day 3. He continues to take Eliquis  for this   Got inpatient rehab and was discharged home 12/20/23  PMH: L knee arthritis, HLD, HTN, L knee replacement 2022  PAIN:  Are you having pain? No, just having some tightness   LUE in sitting: Vitals:   03/14/24 0938 03/14/24 0943  BP: (!) 135/98 134/84  Pulse: (!) 102 87      PRECAUTIONS: Back  Precaution/Restrictions Comments: spinal precautions, no brace needed per orders - spinal precautions lifted at 8/13 visit - can lift up to 25 lbs, can bend and twist to tolerance  FALLS: Has patient fallen in last 6 months? Yes. Number of falls 20, did not injure himself, legs were weak and didn't have strength in his legs  LIVING ENVIRONMENT: Lives with: lives with their spouse Lives in: House/apartment Stairs: Yes: External: 2 steps; none Has following equipment at home: Single point cane, Walker - 2 wheeled, Environmental consultant - 4 wheeled, shower chair, and raised grab bars  PLOF: Independent, Vocation/Vocational requirements: Estate agent , and Leisure: Working out, taking walks   PATIENT GOALS: Wants to get back to being independent, getting his strength back and walking on his own   OBJECTIVE:  Note: Objective measures were completed at Evaluation unless otherwise noted.  COGNITION: Overall cognitive status: Within functional limits for tasks assessed   SENSATION: Light touch: WFL Proprioception: WFL and with ankle DF/PF, noted some bouts of clonus bilat when testing into ankle DF   Pt reporting numbness/tingling in L knee and both feet have numbness/tingling and coldness  Sometimes if he rubs up against something will feel warmth   COORDINATION: Heel to shin: slightly more difficult with LLE     POSTURE: No Significant postural limitations  LOWER EXTREMITY ROM:    Pt with limited L knee extension/flexion ROM due to hx of L knee replacement   LOWER EXTREMITY MMT:    MMT Right Eval Left Eval  Hip flexion 4+ 4-  Hip extension    Hip abduction 4+ 4+  Hip adduction 5 5  Hip internal rotation    Hip external rotation    Knee flexion 5 4+  Knee extension 4+ 4-  Ankle dorsiflexion 5 5  Ankle plantarflexion    Ankle inversion    Ankle eversion    (Blank rows = not tested)  All tested in sitting   BED MOBILITY:  Pt reporting no difficulties, performing the log roll   TRANSFERS: Sit to stand: SBA  Assistive device utilized: None     Stand to sit: SBA  Assistive device utilized: None      Able to perform with no UE support, wide BOS with LLE staggered behind R  Pt was very  pleased with this, did not realize he could perform without his hands   GAIT: Findings: Gait Characteristics: step through pattern, decreased stride length, and narrow BOS, Distance walked: Clinic distances, Assistive device utilized:Single point cane and Walker - 2 wheeled, Level of assistance: Modified independence, SBA, and CGA, and Comments: mod I with RW, SBA/CGA with SPC   FUNCTIONAL TESTS:  5 times sit to stand: 17.8 seconds with no UE support  Timed up and go (TUG): 16.5 seconds with RW, 20.1 seconds with SPC  10 meter walk test: 17.8 seconds with RW = 1.84 ft/sec                                                                                                                               TREATMENT DATE: 03/14/24  Therapeutic Activity:  Vitals:   03/14/24 0938 03/14/24 0943  BP: (!) 135/98 134/84  Pulse: (!) 102 87   Assessed BP and WNL for therapy.  BP slightly elevated at rest at start of session. After seated rest, BP was WNL for therapy.   Education on monitoring HR on smart watch during exercising at home, monitoring any symptoms.  SciFit x 6 minutes, hills with varied  resistance throughout, up to 5-7 resistance, musculature warm up, endurance, reciprocal patterning for improved gait  NMR, single leg balance with reaching for improved dynamic balance with ADLs, reduce fall risk, quadruped for improved trunk stability and paraspinal strengthening:   SLS with Blazepods reaching task, 2 sets x 45 seconds each leg Right SLS: 22, 24 hits in 45 seconds Left SLS: 29, 37 hits in 45 seconds Unsteady with CGA throughout, improved with second round Quadruped alternating hip extension x 10 total Quadruped alternating arm reach/shoulder flexion x 10 total Quadruped cat cows x 10 reps total Quadruped bird dogs (alternating arms and leg extension), 2 sets x 10 reps total *Seated and standing rest breaks throughout due to fatigue  PATIENT EDUCATION: Education details: continue HEP, HR monitoring education Person educated: Patient Education method: Explanation, Demonstration, and Verbal cues Education comprehension: verbalized understanding and returned demonstration  HOME EXERCISE PROGRAM:  Access Code: [email or web address] URL: [email or web address] Date: 02/15/2024 Prepared by: Sheffield Senate  Exercises - Supine Hamstring Stretch with Strap  - 1 x daily - 7 x weekly - 3 sets - 10 reps - Supine Heel Slide with Strap  - 1 x daily - 7 x weekly - 3 sets - 10 reps - Supine 90/90 Alternating Toe Touch  - 1 x daily - 7 x weekly - 3 sets - 10 reps - Dead Bug  - 1 x daily - 7 x weekly - 3 sets - 10 reps - Mini Squats with Walker and Chair  - 1 x daily - 7 x weekly - 4 sets - 10 reps - Side Stepping with Resistance at Ankles and Counter Support  - 1 x daily - 7 x weekly - 3 sets - 10 reps - Forward and Backward  Monster Walk with Counter Support  - 1 x daily - 7 x weekly - 3 sets - 10 reps - Supine Figure 4 Piriformis Stretch  - 1 x daily - 7 x weekly - 2 sets - 30 hold - Modified Thomas Stretch  - 1 x daily - 7 x weekly - 3 sets - 60 hold - Sidelying Hip Abduction   - 1 x daily - 5 x weekly - 2 sets - 10 reps - Single Leg Bridge  - 1 x daily - 5 x weekly - 1 sets - 10 reps  FOR AQUATIC USE: Access Code: [email or web address] URL: [email or web address] Date: 02/02/2024 Prepared by: Daved Bull  Exercises - Push-Up on Pool Wall  - 1 x daily - 7 x weekly - 3 sets - 10 reps - Forward and Backward Stepping at [employer]  - 1 x daily - 7 x weekly - 3 sets - 10 reps - Asymmetrical Shoulder Flexion Extension with Hand Floats  - 1 x daily - 7 x weekly - 3 sets - 10 reps - Heel Toe Raises at Pool Wall  - 1 x daily - 7 x weekly - 3 sets - 10 reps - Lateral Stepping at Pool Wall  - 1 x daily - 7 x weekly - 3 sets - 10 reps - Standing Hip Circles at [employer]  - 1 x daily - 7 x weekly - 3 sets - 10 reps - Lunge to Target at [employer]  - 1 x daily - 7 x weekly - 3 sets - 10 reps - Plank with Hip Extension at [employer]  - 1 x daily - 7 x weekly - 3 sets - 10 reps - Standing 3-Way Kick with Ankle Float at [employer]  - 1 x daily - 7 x weekly - 3 sets - 10 reps - Sitting Balance on Pool Noodle  - 1 x daily - 7 x weekly - 3 sets - 10 reps   GOALS: Goals reviewed with patient? Yes  SHORT TERM GOALS: Target date: 01/31/2024  Pt will be independent with initial HEP in order to build upon functional gains made in therapy. Baseline:  IND (9/3) Goal status: MET  2.  Pt will improve gait speed with LRAD to at least 2.2 ft/sec in order to demo improved community mobility.   Baseline: 17.8 seconds with RW = 1.84 ft/sec; 2.90 ft/sec w/ rollator (9/3) Goal status: MET  3.  Pt will improve TUG time to 17 seconds or less with SPC in order to demo decrease fall risk.  Baseline: 20.1 seconds with SPC; 16.29 sec w/ SPC SBA (9/3) Goal status: MET   LONG TERM GOALS: Target date: 02/21/2024  Pt will be independent with initial HEP in order to build upon functional gains made in therapy. Baseline: pt has been consistently performing HEP  Goal status: MET  2. Pt will  improve BERG to at least a 46/56 in order to demo decr fall risk.  Baseline: 38/56  51/56 (9/30) Goal status: MET  3.   Pt will improve TUG time to 13.5 seconds or less with SPC/no AD in order to demo decrease fall risk. Baseline:  20.1 seconds with SPC; 16.29 sec w/ SPC SBA (9/3)  14.6 seconds with SPC (9/30) Goal status: NOT MET  4.  Pt will improve gait speed with LRAD to at least 3.1 ft/sec in order to demo improved community mobility.  Baseline: 17.8 seconds with RW = 1.84 ft/sec; 2.90 ft/sec w/  rollator (9/3)  13.9 seconds with SPC = 2.34 ft/sec  12.1 seconds with rollator = 2.1 ft/sec Goal status: NOT MET  5.  Pt will improve 5x sit<>stand to less than or equal to 14 sec to demonstrate improved functional strength and transfer efficiency.  Baseline: 17.8 seconds with no UE support  12.1 seconds with no UE support (9/30) Goal status: MET  6.  Pt will ambulate at least 500' outdoors on unlevel surfaces with LRAD in order to demo improved community mobility.  Baseline: unable to check during session due to rainy weather  Goal status: N/A  UPDATED/REVISED GOALS FOR RE-CERT:  LONG TERM GOALS: Target date: 03/28/2024  Pt will be independent with final HEP for land/aquatic therapy in order to build upon functional gains made in therapy. Baseline: pt has been consistently performing land HEP, will benefit from further updates/revisions  Goal status: REVISED  2. Pt will improve FGA to at least a 21/30 in order to demo decr fall risk. Baseline: 16/30 Goal status: NEW  3.   Pt will improve TUG time to 13.5 seconds or less with SPC/no AD in order to demo decrease fall risk. Baseline:  20.1 seconds with SPC; 16.29 sec w/ SPC SBA (9/3)  14.6 seconds with SPC (9/30) Goal status: ON-GOING  4.  Pt will improve gait speed with LRAD to at least 2.6 ft/sec in order to demo improved community mobility.  Baseline: 17.8 seconds with RW = 1.84 ft/sec; 2.90 ft/sec w/ rollator  (9/3)  13.9 seconds with SPC = 2.34 ft/sec  12.1 seconds with rollator = 2.1 ft/sec Goal status: REVISED     ASSESSMENT:  CLINICAL IMPRESSION: Pt arrived to clinic using rollator. BP was elevated with first reading and decreased after rest and with second reading. Rollator was used during session between exercises and while walking around the gym. No episodes of knee buckling session with rollator, but performed gait during with no AD. Pt with no episodes of knee buckling. Session focused on single leg stand dynamic balance for improved ADLs and decreased fall risk. Quadruped activities were continued today and were well tolerated for improved trunk control and paraspinal strengthening. Pt was able to complete bird dogs with less weight shifting and more control. CGA needed during exercises. Will continue per POC.   OBJECTIVE IMPAIRMENTS: Abnormal gait, decreased activity tolerance, decreased balance, decreased coordination, decreased mobility, difficulty walking, decreased ROM, decreased strength, impaired flexibility, impaired sensation, postural dysfunction, and pain.   ACTIVITY LIMITATIONS: carrying, lifting, bending, stairs, transfers, and locomotion level  PARTICIPATION LIMITATIONS: shopping, community activity, occupation, and yard work  PERSONAL FACTORS: Behavior pattern, Past/current experiences, Time since onset of injury/illness/exacerbation, and 3+ comorbidities: L knee arthritis, HLD, HTN, L knee replacement 2022 are also affecting patient's functional outcome.   REHAB POTENTIAL: Good  CLINICAL DECISION MAKING: Evolving/moderate complexity  EVALUATION COMPLEXITY: Moderate  PLAN:  PT FREQUENCY: 2x/week  PT DURATION: 8 weeks plus an additional 2x week for 4 weeks per re-cert   PLANNED INTERVENTIONS: [id number]- PT Re-evaluation, 97110-Therapeutic exercises, 97530- Therapeutic activity, 97112- Neuromuscular re-education, 97535- Self Care, [id number]- Manual therapy, [phone]- Gait  training, [phone]- Aquatic Therapy, Patient/Family education, Balance training, Stair training, and DME instructions  PLAN FOR NEXT SESSION:  BLE strength esp LLE- SLS tasks, dynamic stability work, resisted gait/step outs, lunge and squat variations; floor recovery. Continue quadruped exercises   Aquatics: He has aquatic HEP that can be reviewed. Work on gait, balance, functional strength    Emmalene Sherry, Student-PT, DPT 03/14/2024, 12:14 PM

## 2024-03-16 ENCOUNTER — Encounter: Payer: Self-pay | Admitting: Physical Therapy

## 2024-03-16 ENCOUNTER — Ambulatory Visit: Admitting: Physical Therapy

## 2024-03-16 DIAGNOSIS — R2689 Other abnormalities of gait and mobility: Secondary | ICD-10-CM

## 2024-03-16 DIAGNOSIS — M4714 Other spondylosis with myelopathy, thoracic region: Secondary | ICD-10-CM | POA: Diagnosis not present

## 2024-03-16 DIAGNOSIS — M546 Pain in thoracic spine: Secondary | ICD-10-CM

## 2024-03-16 DIAGNOSIS — R278 Other lack of coordination: Secondary | ICD-10-CM

## 2024-03-16 DIAGNOSIS — M6281 Muscle weakness (generalized): Secondary | ICD-10-CM

## 2024-03-16 DIAGNOSIS — R2681 Unsteadiness on feet: Secondary | ICD-10-CM | POA: Diagnosis not present

## 2024-03-16 NOTE — Therapy (Signed)
 OUTPATIENT PHYSICAL THERAPY NEURO TREATMENT   Patient Name: Corey [name] MRN: [id number] DOB:[birth year], 58 y.o., male Today's Date: 03/16/2024   PCP: Joyce Norleen BROCKS, MD REFERRING PROVIDER: Pegge Toribio PARAS, PA-C  END OF SESSION:  PT End of Session - 03/16/24 1202     Visit Number 18    Number of Visits 21    Date for Recertification  03/30/24   per re-cert on 0/69/74   Authorization Type [employer]    PT Start Time 1023    PT Stop Time 1106    PT Time Calculation (min) 43 min    Equipment Utilized During Treatment Other (comment)   aquatic devices as needed for safety and challenge   Activity Tolerance Patient tolerated treatment well    Behavior During Therapy WFL for tasks assessed/performed           Past Medical History:  Diagnosis Date   Arthritis    knee, left   ED (erectile dysfunction)    Hyperlipidemia    Hypertension    Sleep apnea    CPAP - not using lately due to recall    Past Surgical History:  Procedure Laterality Date   ANKLE BONE SURGERY     APPLICATION OF INTRAOPERATIVE CT SCAN  12/07/2023   Procedure: APPLICATION OF INTRAOPERATIVE CT SCAN;  Surgeon: Claudene Penne ORN, MD;  Location: [facility];  Service: Neurosurgery;;   BACK SURGERY     COLLAR BONE SURGEY     COLONOSCOPY     FRACTURE SURGERY     HERNIA REPAIR     IR IVC FILTER PLMT / S&I /IMG GUID/MOD SED  12/12/2023   KNEE ARTHROSCOPY Left 2006   LAPAROSCOPIC APPENDECTOMY N/A 01/13/2021   Procedure: APPENDECTOMY LAPAROSCOPIC WITH LYSIS OF ADHESIONS;  Surgeon: Sheldon Standing, MD;  Location: [facility];  Service: General;  Laterality: N/A;   LUMBAR MICRODISCECTOMY     POLYPECTOMY     SKIN BIOPSY Left 08/11/2021   nodulocystic fat nercrosis   TOTAL KNEE ARTHROPLASTY Left 11/11/2020   Procedure: LEFT TOTAL KNEE ARTHROPLASTY;  Surgeon: Barbarann Oneil BROCKS, MD;  Location: [facility];  Service: Orthopedics;  Laterality: Left;   UMBILICAL HERNIA REPAIR     Patient Active Problem List    Diagnosis Date Noted   Muscle spasticity 03/13/2024   Abnormality of gait 03/13/2024   Anxiety 01/12/2024   Chronic low back pain 01/12/2024   Acute embolism and thrombosis of unspecified deep veins of unspecified lower extremity (HCC) 01/12/2024   Coping style affecting medical condition 12/15/2023   Myelopathy concurrent with and due to spinal stenosis of thoracic region [facility]) 12/06/2023   Spinal stenosis 12/06/2023   Thoracic myelopathy 12/06/2023   Morbid obesity (HCC) 12/06/2023   Depression 12/06/2023   Radiculopathy, lumbar region 11/10/2023   Flexion contracture of knee, left 03/01/2023   Prediabetes 11/19/2022   Status post laparoscopic appendectomy 01/14/2021   H/O total knee replacement, left 11/11/2020   Leiomyoma 11/08/2020   Arthritis 09/26/2018   Onychomycosis 09/26/2018   Hyperlipidemia LDL goal <100 08/31/2016   OSA on CPAP 08/31/2016   Premature ejaculation 08/19/2011    ONSET DATE: 12/16/2023  REFERRING DIAG: M47.14 (ICD-10-CM) - Thoracic myelopathy  THERAPY DIAG:  Muscle weakness (generalized)  Unsteadiness on feet  Other lack of coordination  Other abnormalities of gait and mobility  Pain in thoracic spine  Rationale for Evaluation and Treatment: Rehabilitation  SUBJECTIVE:  SUBJECTIVE STATEMENT:   Patient enters [facility] alone w/ rollator.  He is having mild thoracic back pain.  Pt accompanied by: self  PERTINENT HISTORY: Status post T3 transpedicular decompression, T1-T4 PSF 12/07/2023 due to severe thoracic myelopathy with progressive ambulatory functional loss per Dr. Penne Sharps neurosurgery.NO BRACE REQUIRED. Continues to have some pain in his mid back up to his neck. He does feel as though the weakness in his legs is improving however he continues to use a  rollator. No new weakness, numbness or tingling.Of note his operative course was complicated after shown to have a DVT postoperatively on day 3. He continues to take Eliquis  for this   Got inpatient rehab and was discharged home 12/20/23  PMH: L knee arthritis, HLD, HTN, L knee replacement 2022  PAIN:  Are you having pain? Yes: NPRS scale: 3 Pain location: midback Pain description: sore/ache/tight Aggravating factors: just there Relieving factors: moving/stretching   LUE in sitting: There were no vitals filed for this visit.     PRECAUTIONS: Back  Precaution/Restrictions Comments: spinal precautions, no brace needed per orders - spinal precautions lifted at 8/13 visit - can lift up to 25 lbs, can bend and twist to tolerance  FALLS: Has patient fallen in last 6 months? Yes. Number of falls 20, did not injure himself, legs were weak and didn't have strength in his legs  LIVING ENVIRONMENT: Lives with: lives with their spouse Lives in: House/apartment Stairs: Yes: External: 2 steps; none Has following equipment at home: Single point cane, Walker - 2 wheeled, Environmental consultant - 4 wheeled, shower chair, and raised grab bars  PLOF: Independent, Vocation/Vocational requirements: Estate agent , and Leisure: Working out, taking walks   PATIENT GOALS: Wants to get back to being independent, getting his strength back and walking on his own   OBJECTIVE:  Note: Objective measures were completed at Evaluation unless otherwise noted.  COGNITION: Overall cognitive status: Within functional limits for tasks assessed   SENSATION: Light touch: WFL Proprioception: WFL and with ankle DF/PF, noted some bouts of clonus bilat when testing into ankle DF   Pt reporting numbness/tingling in L knee and both feet have numbness/tingling and coldness  Sometimes if he rubs up against something will feel warmth   COORDINATION: Heel to shin: slightly more difficult with LLE    POSTURE: No Significant  postural limitations  LOWER EXTREMITY ROM:    Pt with limited L knee extension/flexion ROM due to hx of L knee replacement   LOWER EXTREMITY MMT:    MMT Right Eval Left Eval  Hip flexion 4+ 4-  Hip extension    Hip abduction 4+ 4+  Hip adduction 5 5  Hip internal rotation    Hip external rotation    Knee flexion 5 4+  Knee extension 4+ 4-  Ankle dorsiflexion 5 5  Ankle plantarflexion    Ankle inversion    Ankle eversion    (Blank rows = not tested)  All tested in sitting   BED MOBILITY:  Pt reporting no difficulties, performing the log roll   TRANSFERS: Sit to stand: SBA  Assistive device utilized: None     Stand to sit: SBA  Assistive device utilized: None      Able to perform with no UE support, wide BOS with LLE staggered behind R  Pt was very pleased with this, did not realize he could perform without his hands   GAIT: Findings: Gait Characteristics: step through pattern, decreased stride length, and narrow BOS, Distance walked:  Clinic distances, Assistive device utilized:Single point cane and Walker - 2 wheeled, Level of assistance: Modified independence, SBA, and CGA, and Comments: mod I with RW, SBA/CGA with SPC   FUNCTIONAL TESTS:  5 times sit to stand: 17.8 seconds with no UE support  Timed up and go (TUG): 16.5 seconds with RW, 20.1 seconds with SPC  10 meter walk test: 17.8 seconds with RW = 1.84 ft/sec                                                                                                                               TREATMENT DATE: 03/16/24 Aquatic therapy at [facility] - pool temperature 92 degrees   Patient seen for aquatic therapy today.  Treatment took place in water  3.6-4.8 feet deep depending upon activity.  Patient entered and exited the pool via stairs reciprocally using bilateral railing at mod I level.   Exercises: STS w/ barbell pushdown 3x10 Sumo squat 2x10 w/ blue DB Squat w/ blue DB add/abd 2x10 Forward lunge w/ blue DB trunk  rotation over front leg 3x10 alternating sides Repeated tall kneel on bench x10 alternating leading LE  High lunge into extension w/ BUE overhead x10 alternating LE Modified warrior III using wall support prog to none 6x5-10 seconds each LE  Falling star 6x10 sec each LE progressing away from the wall  Patient requires buoyancy of the water  for support for reduced fall risk with gait training and balance exercises with SBA support. Exercises able to be performed safely in water  without the risk of fall compared to those same exercises performed on land; viscosity of water  needed for resistance for strengthening. Current of water  provides perturbations for challenging static and dynamic balance.   PATIENT EDUCATION: Education details: continue HEP Person educated: Patient Education method: Explanation, Demonstration, and Verbal cues Education comprehension: verbalized understanding and returned demonstration  HOME EXERCISE PROGRAM:  Access Code: [email or web address] URL: [email or web address] Date: 02/15/2024 Prepared by: Sheffield Senate  Exercises - Supine Hamstring Stretch with Strap  - 1 x daily - 7 x weekly - 3 sets - 10 reps - Supine Heel Slide with Strap  - 1 x daily - 7 x weekly - 3 sets - 10 reps - Supine 90/90 Alternating Toe Touch  - 1 x daily - 7 x weekly - 3 sets - 10 reps - Dead Bug  - 1 x daily - 7 x weekly - 3 sets - 10 reps - Mini Squats with Walker and Chair  - 1 x daily - 7 x weekly - 4 sets - 10 reps - Side Stepping with Resistance at Ankles and Counter Support  - 1 x daily - 7 x weekly - 3 sets - 10 reps - Forward and Backward Monster Walk with Counter Support  - 1 x daily - 7 x weekly - 3 sets - 10 reps - Supine Figure 4 Piriformis Stretch  - 1 x daily - 7 x weekly - 2 sets -  30 hold - Modified Thomas Stretch  - 1 x daily - 7 x weekly - 3 sets - 60 hold - Sidelying Hip Abduction  - 1 x daily - 5 x weekly - 2 sets - 10 reps - Single Leg Bridge  - 1 x daily - 5 x  weekly - 1 sets - 10 reps  FOR AQUATIC USE: Access Code: [email or web address] URL: [email or web address] Date: 02/02/2024 Prepared by: Daved Bull  Exercises - Push-Up on Pool Wall  - 1 x daily - 7 x weekly - 3 sets - 10 reps - Forward and Backward Stepping at [employer]  - 1 x daily - 7 x weekly - 3 sets - 10 reps - Asymmetrical Shoulder Flexion Extension with Hand Floats  - 1 x daily - 7 x weekly - 3 sets - 10 reps - Heel Toe Raises at Pool Wall  - 1 x daily - 7 x weekly - 3 sets - 10 reps - Lateral Stepping at Pool Wall  - 1 x daily - 7 x weekly - 3 sets - 10 reps - Standing Hip Circles at [employer]  - 1 x daily - 7 x weekly - 3 sets - 10 reps - Lunge to Target at [employer]  - 1 x daily - 7 x weekly - 3 sets - 10 reps - Plank with Hip Extension at [employer]  - 1 x daily - 7 x weekly - 3 sets - 10 reps - Standing 3-Way Kick with Ankle Float at [employer]  - 1 x daily - 7 x weekly - 3 sets - 10 reps - Sitting Balance on Pool Noodle  - 1 x daily - 7 x weekly - 3 sets - 10 reps   GOALS: Goals reviewed with patient? Yes  SHORT TERM GOALS: Target date: 01/31/2024  Pt will be independent with initial HEP in order to build upon functional gains made in therapy. Baseline:  IND (9/3) Goal status: MET  2.  Pt will improve gait speed with LRAD to at least 2.2 ft/sec in order to demo improved community mobility.   Baseline: 17.8 seconds with RW = 1.84 ft/sec; 2.90 ft/sec w/ rollator (9/3) Goal status: MET  3.  Pt will improve TUG time to 17 seconds or less with SPC in order to demo decrease fall risk.  Baseline: 20.1 seconds with SPC; 16.29 sec w/ SPC SBA (9/3) Goal status: MET   LONG TERM GOALS: Target date: 02/21/2024  Pt will be independent with initial HEP in order to build upon functional gains made in therapy. Baseline: pt has been consistently performing HEP  Goal status: MET  2. Pt will improve BERG to at least a 46/56 in order to demo decr fall risk.  Baseline:  38/56  51/56 (9/30) Goal status: MET  3.   Pt will improve TUG time to 13.5 seconds or less with SPC/no AD in order to demo decrease fall risk. Baseline:  20.1 seconds with SPC; 16.29 sec w/ SPC SBA (9/3)  14.6 seconds with SPC (9/30) Goal status: NOT MET  4.  Pt will improve gait speed with LRAD to at least 3.1 ft/sec in order to demo improved community mobility.  Baseline: 17.8 seconds with RW = 1.84 ft/sec; 2.90 ft/sec w/ rollator (9/3)  13.9 seconds with SPC = 2.34 ft/sec  12.1 seconds with rollator = 2.1 ft/sec Goal status: NOT MET  5.  Pt will improve 5x sit<>stand to less than or equal to 14 sec to  demonstrate improved functional strength and transfer efficiency.  Baseline: 17.8 seconds with no UE support  12.1 seconds with no UE support (9/30) Goal status: MET  6.  Pt will ambulate at least 500' outdoors on unlevel surfaces with LRAD in order to demo improved community mobility.  Baseline: unable to check during session due to rainy weather  Goal status: N/A  UPDATED/REVISED GOALS FOR RE-CERT:  LONG TERM GOALS: Target date: 03/28/2024  Pt will be independent with final HEP for land/aquatic therapy in order to build upon functional gains made in therapy. Baseline: pt has been consistently performing land HEP, will benefit from further updates/revisions  Goal status: REVISED  2. Pt will improve FGA to at least a 21/30 in order to demo decr fall risk. Baseline: 16/30 Goal status: NEW  3.   Pt will improve TUG time to 13.5 seconds or less with SPC/no AD in order to demo decrease fall risk. Baseline:  20.1 seconds with SPC; 16.29 sec w/ SPC SBA (9/3)  14.6 seconds with SPC (9/30) Goal status: ON-GOING  4.  Pt will improve gait speed with LRAD to at least 2.6 ft/sec in order to demo improved community mobility.  Baseline: 17.8 seconds with RW = 1.84 ft/sec; 2.90 ft/sec w/ rollator (9/3)  13.9 seconds with SPC = 2.34 ft/sec  12.1 seconds with rollator = 2.1  ft/sec Goal status: REVISED     ASSESSMENT:  CLINICAL IMPRESSION: Pt seen for aquatic session today with mild thoracic back pain that somewhat resolves with mobility.  He is able to progress squat and lunge form today as well as SLS to unsupported w/ increased depth of range and intensity of loading.  He continues to benefit from skilled PT to address flexibility, pain management, and optimize upright mobility w/ LRAD.  Will continue per POC.   OBJECTIVE IMPAIRMENTS: Abnormal gait, decreased activity tolerance, decreased balance, decreased coordination, decreased mobility, difficulty walking, decreased ROM, decreased strength, impaired flexibility, impaired sensation, postural dysfunction, and pain.   ACTIVITY LIMITATIONS: carrying, lifting, bending, stairs, transfers, and locomotion level  PARTICIPATION LIMITATIONS: shopping, community activity, occupation, and yard work  PERSONAL FACTORS: Behavior pattern, Past/current experiences, Time since onset of injury/illness/exacerbation, and 3+ comorbidities: L knee arthritis, HLD, HTN, L knee replacement 2022 are also affecting patient's functional outcome.   REHAB POTENTIAL: Good  CLINICAL DECISION MAKING: Evolving/moderate complexity  EVALUATION COMPLEXITY: Moderate  PLAN:  PT FREQUENCY: 2x/week  PT DURATION: 8 weeks plus an additional 2x week for 4 weeks per re-cert   PLANNED INTERVENTIONS: [id number]- PT Re-evaluation, 97110-Therapeutic exercises, 97530- Therapeutic activity, 97112- Neuromuscular re-education, 97535- Self Care, [id number]- Manual therapy, [phone]- Gait training, [phone]- Aquatic Therapy, Patient/Family education, Balance training, Stair training, and DME instructions  PLAN FOR NEXT SESSION:  BLE strength esp LLE- SLS tasks, dynamic stability work, resisted gait/step outs, lunge and squat variations; floor recovery. Continue quadruped exercises   Aquatics: Work on gait, balance, functional strength    Daved KATHEE Bull, PT,  DPT 03/16/2024, 12:05 PM

## 2024-03-21 ENCOUNTER — Ambulatory Visit: Admitting: Physical Therapy

## 2024-03-21 ENCOUNTER — Encounter: Payer: Self-pay | Admitting: Physical Therapy

## 2024-03-21 VITALS — BP 130/90 | HR 93

## 2024-03-21 DIAGNOSIS — R2689 Other abnormalities of gait and mobility: Secondary | ICD-10-CM | POA: Diagnosis not present

## 2024-03-21 DIAGNOSIS — R278 Other lack of coordination: Secondary | ICD-10-CM

## 2024-03-21 DIAGNOSIS — R2681 Unsteadiness on feet: Secondary | ICD-10-CM | POA: Diagnosis not present

## 2024-03-21 DIAGNOSIS — M6281 Muscle weakness (generalized): Secondary | ICD-10-CM | POA: Diagnosis not present

## 2024-03-21 DIAGNOSIS — M546 Pain in thoracic spine: Secondary | ICD-10-CM | POA: Diagnosis not present

## 2024-03-21 DIAGNOSIS — M4714 Other spondylosis with myelopathy, thoracic region: Secondary | ICD-10-CM | POA: Diagnosis not present

## 2024-03-21 NOTE — Therapy (Signed)
 OUTPATIENT PHYSICAL THERAPY NEURO TREATMENT   Patient Name: Corey [name] MRN: [id number] DOB:1965/12/17, 58 y.o., male Today's Date: 03/21/2024   PCP: Joyce Norleen BROCKS, MD REFERRING PROVIDER: Pegge Toribio PARAS, PA-C  END OF SESSION:  PT End of Session - 03/21/24 0929     Visit Number 19    Number of Visits 21    Date for Recertification  03/30/24   per re-cert on 0/69/74   Authorization Type [employer]    PT Start Time 0928    PT Stop Time 1012    PT Time Calculation (min) 44 min    Equipment Utilized During Treatment Gait belt    Activity Tolerance Patient tolerated treatment well    Behavior During Therapy WFL for tasks assessed/performed           Past Medical History:  Diagnosis Date   Arthritis    knee, left   ED (erectile dysfunction)    Hyperlipidemia    Hypertension    Sleep apnea    CPAP - not using lately due to recall    Past Surgical History:  Procedure Laterality Date   ANKLE BONE SURGERY     APPLICATION OF INTRAOPERATIVE CT SCAN  12/07/2023   Procedure: APPLICATION OF INTRAOPERATIVE CT SCAN;  Surgeon: Claudene Penne ORN, MD;  Location: [facility];  Service: Neurosurgery;;   BACK SURGERY     COLLAR BONE SURGEY     COLONOSCOPY     FRACTURE SURGERY     HERNIA REPAIR     IR IVC FILTER PLMT / S&I /IMG GUID/MOD SED  12/12/2023   KNEE ARTHROSCOPY Left 2006   LAPAROSCOPIC APPENDECTOMY N/A 01/13/2021   Procedure: APPENDECTOMY LAPAROSCOPIC WITH LYSIS OF ADHESIONS;  Surgeon: Sheldon Standing, MD;  Location: [facility];  Service: General;  Laterality: N/A;   LUMBAR MICRODISCECTOMY     POLYPECTOMY     SKIN BIOPSY Left 08/11/2021   nodulocystic fat nercrosis   TOTAL KNEE ARTHROPLASTY Left 11/11/2020   Procedure: LEFT TOTAL KNEE ARTHROPLASTY;  Surgeon: Barbarann Oneil BROCKS, MD;  Location: [facility];  Service: Orthopedics;  Laterality: Left;   UMBILICAL HERNIA REPAIR     Patient Active Problem List   Diagnosis Date Noted   Muscle spasticity 03/13/2024    Abnormality of gait 03/13/2024   Anxiety 01/12/2024   Chronic low back pain 01/12/2024   Acute embolism and thrombosis of unspecified deep veins of unspecified lower extremity (HCC) 01/12/2024   Coping style affecting medical condition 12/15/2023   Myelopathy concurrent with and due to spinal stenosis of thoracic region [facility]) 12/06/2023   Spinal stenosis 12/06/2023   Thoracic myelopathy 12/06/2023   Morbid obesity (HCC) 12/06/2023   Depression 12/06/2023   Radiculopathy, lumbar region 11/10/2023   Flexion contracture of knee, left 03/01/2023   Prediabetes 11/19/2022   Status post laparoscopic appendectomy 01/14/2021   H/O total knee replacement, left 11/11/2020   Leiomyoma 11/08/2020   Arthritis 09/26/2018   Onychomycosis 09/26/2018   Hyperlipidemia LDL goal <100 08/31/2016   OSA on CPAP 08/31/2016   Premature ejaculation 08/19/2011    ONSET DATE: 12/16/2023  REFERRING DIAG: M47.14 (ICD-10-CM) - Thoracic myelopathy  THERAPY DIAG:  Muscle weakness (generalized)  Unsteadiness on feet  Other abnormalities of gait and mobility  Other lack of coordination  Rationale for Evaluation and Treatment: Rehabilitation  SUBJECTIVE:  SUBJECTIVE STATEMENT:   Was feeling sore in his core over the weekend after aquatic therapy. No falls, but had some near falls and would have fallen if not using cane or rollator. SABRA Has been using the cane again at home. Has not yet gotten the results from his heart monitor. Getting stronger with therapy, just afterwards the pain seems to linger. In between off days, it hurts a little more and needs that days in between to recoup.   Pt accompanied by: self  PERTINENT HISTORY: Status post T3 transpedicular decompression, T1-T4 PSF 12/07/2023 due to severe thoracic myelopathy  with progressive ambulatory functional loss per Dr. Penne Sharps neurosurgery.NO BRACE REQUIRED. Continues to have some pain in his mid back up to his neck. He does feel as though the weakness in his legs is improving however he continues to use a rollator. No new weakness, numbness or tingling.Of note his operative course was complicated after shown to have a DVT postoperatively on day 3. He continues to take Eliquis  for this   Got inpatient rehab and was discharged home 12/20/23  PMH: L knee arthritis, HLD, HTN, L knee replacement 2022  PAIN:  Are you having pain? Yes: NPRS scale: 3 Pain location: midback Pain description: sore/ache/tight Aggravating factors: just there Relieving factors: moving/stretching   LUE in sitting: Vitals:   03/21/24 0937  BP: (!) 130/90  Pulse: 93     PRECAUTIONS: Back  Precaution/Restrictions Comments: spinal precautions, no brace needed per orders - spinal precautions lifted at 8/13 visit - can lift up to 25 lbs, can bend and twist to tolerance  FALLS: Has patient fallen in last 6 months? Yes. Number of falls 20, did not injure himself, legs were weak and didn't have strength in his legs  LIVING ENVIRONMENT: Lives with: lives with their spouse Lives in: House/apartment Stairs: Yes: External: 2 steps; none Has following equipment at home: Single point cane, Walker - 2 wheeled, Environmental consultant - 4 wheeled, shower chair, and raised grab bars  PLOF: Independent, Vocation/Vocational requirements: Estate agent , and Leisure: Working out, taking walks   PATIENT GOALS: Wants to get back to being independent, getting his strength back and walking on his own   OBJECTIVE:  Note: Objective measures were completed at Evaluation unless otherwise noted.  COGNITION: Overall cognitive status: Within functional limits for tasks assessed   SENSATION: Light touch: WFL Proprioception: WFL and with ankle DF/PF, noted some bouts of clonus bilat when testing into  ankle DF   Pt reporting numbness/tingling in L knee and both feet have numbness/tingling and coldness  Sometimes if he rubs up against something will feel warmth   COORDINATION: Heel to shin: slightly more difficult with LLE    POSTURE: No Significant postural limitations  LOWER EXTREMITY ROM:    Pt with limited L knee extension/flexion ROM due to hx of L knee replacement   LOWER EXTREMITY MMT:    MMT Right Eval Left Eval  Hip flexion 4+ 4-  Hip extension    Hip abduction 4+ 4+  Hip adduction 5 5  Hip internal rotation    Hip external rotation    Knee flexion 5 4+  Knee extension 4+ 4-  Ankle dorsiflexion 5 5  Ankle plantarflexion    Ankle inversion    Ankle eversion    (Blank rows = not tested)  All tested in sitting   BED MOBILITY:  Pt reporting no difficulties, performing the log roll   TRANSFERS: Sit to stand: SBA  Assistive device  utilized: None     Stand to sit: SBA  Assistive device utilized: None      Able to perform with no UE support, wide BOS with LLE staggered behind R  Pt was very pleased with this, did not realize he could perform without his hands   GAIT: Findings: Gait Characteristics: step through pattern, decreased stride length, and narrow BOS, Distance walked: Clinic distances, Assistive device utilized:Single point cane and Walker - 2 wheeled, Level of assistance: Modified independence, SBA, and CGA, and Comments: mod I with RW, SBA/CGA with SPC   FUNCTIONAL TESTS:  5 times sit to stand: 17.8 seconds with no UE support  Timed up and go (TUG): 16.5 seconds with RW, 20.1 seconds with SPC  10 meter walk test: 17.8 seconds with RW = 1.84 ft/sec                                                                                                                               TREATMENT DATE: 03/21/24  Therapeutic Activity: Vitals:   03/21/24 0937  BP: (!) 130/90  Pulse: 93   Assessed BP and WNL.   Discussed PT POC going forwards - will add 2  more appts to next week in POC (one land, and one pool), and discussed plan for D/C next week with pt to work on HEP going forwards to work on strength/balance and then can return to therapy again after a few months of going to the gym and doing land/aquatic HEP, pt in agreement with this plan.   SciFit with BLE/BUE at [employer] [place] for 8 minutes for strength,activity tolerance/endurance. Pt reports RPE as 7/10.   Worked on getting down to the floor on red mat: Pt getting down with CGA and BUE support on mat table,getting into half kneel position  In half kneel with RLE anteriorly: worked on static balance 30 seconds, then added in holding 2# dowel and performing 10 reps chest presses and 10 reps shoulder press overhead for balance/core stability, intermittent CGA for balance  Attempted to get LLE anteriorly, with pt needing to grab L foot and try to bring it forwards with min A from therapist to try to get LLE placed properly. Once in position, pt reporting feeling a bit of discomfort and weak and needed to get out of this position Performed floor transfer with BUE support on mat table and getting in half kneel and coming to stand with min A from therapist to help assist with balance, pt needing seated rest break afterwards   Therapeutic Exercise: Marching at countertop down and back x3 reps with blue t-band around thighs for SLS stability/hip flexor strengthening, pt with one episode of R knee buckling, but pt able to keep balance when holding onto countertop  Standing hip ABD 2 x 10 reps each side with blue t-band at ankles Added both above to HEP, pt reporting he did not need handout   At edge of mat  table on ankle board: 2 sets of 10 reps heel elevated squats with 3 second hold   PATIENT EDUCATION: Education details: continue HEP, adding 2 more appts for next week and then plan to D/C  Person educated: Patient, pt's spouse  Education method: Explanation, Demonstration, and Verbal  cues Education comprehension: verbalized understanding and returned demonstration  HOME EXERCISE PROGRAM:  Access Code: [email or web address] URL: [email or web address] Date: 02/15/2024 Prepared by: Sheffield Senate  Exercises - Supine Hamstring Stretch with Strap  - 1 x daily - 7 x weekly - 3 sets - 10 reps - Supine Heel Slide with Strap  - 1 x daily - 7 x weekly - 3 sets - 10 reps - Supine 90/90 Alternating Toe Touch  - 1 x daily - 7 x weekly - 3 sets - 10 reps - Dead Bug  - 1 x daily - 7 x weekly - 3 sets - 10 reps - Mini Squats with Walker and Chair  - 1 x daily - 7 x weekly - 4 sets - 10 reps - Side Stepping with Resistance at Ankles and Counter Support  - 1 x daily - 7 x weekly - 3 sets - 10 reps - Forward and Backward Monster Walk with Counter Support  - 1 x daily - 7 x weekly - 3 sets - 10 reps - Supine Figure 4 Piriformis Stretch  - 1 x daily - 7 x weekly - 2 sets - 30 hold - Modified Thomas Stretch  - 1 x daily - 7 x weekly - 3 sets - 60 hold - Sidelying Hip Abduction  - 1 x daily - 5 x weekly - 2 sets - 10 reps - Single Leg Bridge  - 1 x daily - 5 x weekly - 1 sets - 10 reps -Verbally added: standing hip ABD and marching with blue t-band   FOR AQUATIC USE: Access Code: [email or web address] URL: [email or web address] Date: 02/02/2024 Prepared by: Daved Bull  Exercises - Push-Up on Pool Wall  - 1 x daily - 7 x weekly - 3 sets - 10 reps - Forward and Backward Stepping at [employer]  - 1 x daily - 7 x weekly - 3 sets - 10 reps - Asymmetrical Shoulder Flexion Extension with Hand Floats  - 1 x daily - 7 x weekly - 3 sets - 10 reps - Heel Toe Raises at Pool Wall  - 1 x daily - 7 x weekly - 3 sets - 10 reps - Lateral Stepping at Pool Wall  - 1 x daily - 7 x weekly - 3 sets - 10 reps - Standing Hip Circles at [employer]  - 1 x daily - 7 x weekly - 3 sets - 10 reps - Lunge to Target at [employer]  - 1 x daily - 7 x weekly - 3 sets - 10 reps - Plank with Hip Extension at  [employer]  - 1 x daily - 7 x weekly - 3 sets - 10 reps - Standing 3-Way Kick with Ankle Float at [employer]  - 1 x daily - 7 x weekly - 3 sets - 10 reps - Sitting Balance on Pool Noodle  - 1 x daily - 7 x weekly - 3 sets - 10 reps   GOALS: Goals reviewed with patient? Yes  SHORT TERM GOALS: Target date: 01/31/2024  Pt will be independent with initial HEP in order to build upon functional gains made in therapy. Baseline:  IND (9/3) Goal status: MET  2.  Pt will improve gait speed with LRAD to at least 2.2 ft/sec in order to demo improved community mobility.   Baseline: 17.8 seconds with RW = 1.84 ft/sec; 2.90 ft/sec w/ rollator (9/3) Goal status: MET  3.  Pt will improve TUG time to 17 seconds or less with SPC in order to demo decrease fall risk.  Baseline: 20.1 seconds with SPC; 16.29 sec w/ SPC SBA (9/3) Goal status: MET   LONG TERM GOALS: Target date: 02/21/2024  Pt will be independent with initial HEP in order to build upon functional gains made in therapy. Baseline: pt has been consistently performing HEP  Goal status: MET  2. Pt will improve BERG to at least a 46/56 in order to demo decr fall risk.  Baseline: 38/56  51/56 (9/30) Goal status: MET  3.   Pt will improve TUG time to 13.5 seconds or less with SPC/no AD in order to demo decrease fall risk. Baseline:  20.1 seconds with SPC; 16.29 sec w/ SPC SBA (9/3)  14.6 seconds with SPC (9/30) Goal status: NOT MET  4.  Pt will improve gait speed with LRAD to at least 3.1 ft/sec in order to demo improved community mobility.  Baseline: 17.8 seconds with RW = 1.84 ft/sec; 2.90 ft/sec w/ rollator (9/3)  13.9 seconds with SPC = 2.34 ft/sec  12.1 seconds with rollator = 2.1 ft/sec Goal status: NOT MET  5.  Pt will improve 5x sit<>stand to less than or equal to 14 sec to demonstrate improved functional strength and transfer efficiency.  Baseline: 17.8 seconds with no UE support  12.1 seconds with no UE support (9/30) Goal  status: MET  6.  Pt will ambulate at least 500' outdoors on unlevel surfaces with LRAD in order to demo improved community mobility.  Baseline: unable to check during session due to rainy weather  Goal status: N/A  UPDATED/REVISED GOALS FOR RE-CERT:  LONG TERM GOALS: Target date: 03/28/2024  Pt will be independent with final HEP for land/aquatic therapy in order to build upon functional gains made in therapy. Baseline: pt has been consistently performing land HEP, will benefit from further updates/revisions  Goal status: REVISED  2. Pt will improve FGA to at least a 21/30 in order to demo decr fall risk. Baseline: 16/30 Goal status: NEW  3.   Pt will improve TUG time to 13.5 seconds or less with SPC/no AD in order to demo decrease fall risk. Baseline:  20.1 seconds with SPC; 16.29 sec w/ SPC SBA (9/3)  14.6 seconds with SPC (9/30) Goal status: ON-GOING  4.  Pt will improve gait speed with LRAD to at least 2.6 ft/sec in order to demo improved community mobility.  Baseline: 17.8 seconds with RW = 1.84 ft/sec; 2.90 ft/sec w/ rollator (9/3)  13.9 seconds with SPC = 2.34 ft/sec  12.1 seconds with rollator = 2.1 ft/sec Goal status: REVISED     ASSESSMENT:  CLINICAL IMPRESSION: Pt's BP WNL for therapy. Discussed POC going forwards and plan to D/C after next week with pt to plan on continuing HEP and pt still to look into joining a pool. Pt in agreement with this plan. Today's skilled session worked on BLE strengthening on SciFit and in standing. Also worked on floor transfers and core stability/proximal strength/balance in half kneel positions. Pt fatigued easily with this and able to perform with LLE posteriorly, when attempting to perform with RLE posteriorly, pt had incr difficulty getting into this position and then was fatigued and had to get  out of this position. Pt needing min A to safely come to standing for floor transfer after performing half kneel tasks. Pt continues to use  his rollator to come to therapy as pt reports that his knees will still spontaneously buckle and reports had some almost falls (but was using his cane/rollator to prevent a fall). Will continue per POC.   OBJECTIVE IMPAIRMENTS: Abnormal gait, decreased activity tolerance, decreased balance, decreased coordination, decreased mobility, difficulty walking, decreased ROM, decreased strength, impaired flexibility, impaired sensation, postural dysfunction, and pain.   ACTIVITY LIMITATIONS: carrying, lifting, bending, stairs, transfers, and locomotion level  PARTICIPATION LIMITATIONS: shopping, community activity, occupation, and yard work  PERSONAL FACTORS: Behavior pattern, Past/current experiences, Time since onset of injury/illness/exacerbation, and 3+ comorbidities: L knee arthritis, HLD, HTN, L knee replacement 2022 are also affecting patient's functional outcome.   REHAB POTENTIAL: Good  CLINICAL DECISION MAKING: Evolving/moderate complexity  EVALUATION COMPLEXITY: Moderate  PLAN:  PT FREQUENCY: 2x/week  PT DURATION: 8 weeks plus an additional 2x week for 4 weeks per re-cert   PLANNED INTERVENTIONS: [id number]- PT Re-evaluation, 97110-Therapeutic exercises, 97530- Therapeutic activity, 97112- Neuromuscular re-education, 97535- Self Care, [id number]- Manual therapy, [phone]- Gait training, [phone]- Aquatic Therapy, Patient/Family education, Balance training, Stair training, and DME instructions  PLAN FOR NEXT SESSION:  Check goals, finalize land HEP and plan to D/C   Aquatics: Work on gait, balance, functional strength    Sheffield LOISE Senate, PT, DPT 03/21/2024, 10:15 AM

## 2024-03-22 ENCOUNTER — Telehealth: Payer: Self-pay | Admitting: Family Medicine

## 2024-03-22 NOTE — Telephone Encounter (Signed)
 Pt dropped off disability form for completion, please return to Melissa when complete for form fee & call pt to pick up

## 2024-03-23 ENCOUNTER — Ambulatory Visit: Admitting: Physical Therapy

## 2024-03-23 ENCOUNTER — Encounter: Payer: Self-pay | Admitting: Physical Therapy

## 2024-03-23 DIAGNOSIS — R2681 Unsteadiness on feet: Secondary | ICD-10-CM

## 2024-03-23 DIAGNOSIS — R2689 Other abnormalities of gait and mobility: Secondary | ICD-10-CM | POA: Diagnosis not present

## 2024-03-23 DIAGNOSIS — M546 Pain in thoracic spine: Secondary | ICD-10-CM

## 2024-03-23 DIAGNOSIS — M4714 Other spondylosis with myelopathy, thoracic region: Secondary | ICD-10-CM

## 2024-03-23 DIAGNOSIS — R278 Other lack of coordination: Secondary | ICD-10-CM | POA: Diagnosis not present

## 2024-03-23 DIAGNOSIS — M6281 Muscle weakness (generalized): Secondary | ICD-10-CM | POA: Diagnosis not present

## 2024-03-23 NOTE — Therapy (Signed)
 OUTPATIENT PHYSICAL THERAPY NEURO TREATMENT   Patient Name: Corey [name] MRN: [id number] DOB:[birth year], 58 y.o., male Today's Date: 03/23/2024   PCP: Joyce Norleen BROCKS, MD REFERRING PROVIDER: Pegge Toribio PARAS, PA-C  END OF SESSION:  PT End of Session - 03/23/24 1014     Visit Number 20    Number of Visits 21    Date for Recertification  03/30/24   per re-cert on 0/69/74   Authorization Type [employer]    PT Start Time 1015    PT Stop Time 1100    PT Time Calculation (min) 45 min    Equipment Utilized During Treatment Other (comment)   Aquatic devices as needed for safety and challenge   Activity Tolerance Patient tolerated treatment well    Behavior During Therapy WFL for tasks assessed/performed           Past Medical History:  Diagnosis Date   Arthritis    knee, left   ED (erectile dysfunction)    Hyperlipidemia    Hypertension    Sleep apnea    CPAP - not using lately due to recall    Past Surgical History:  Procedure Laterality Date   ANKLE BONE SURGERY     APPLICATION OF INTRAOPERATIVE CT SCAN  12/07/2023   Procedure: APPLICATION OF INTRAOPERATIVE CT SCAN;  Surgeon: Claudene Penne ORN, MD;  Location: [facility] ORS;  Service: Neurosurgery;;   BACK SURGERY     COLLAR BONE SURGEY     COLONOSCOPY     FRACTURE SURGERY     HERNIA REPAIR     IR IVC FILTER PLMT / S&I /IMG GUID/MOD SED  12/12/2023   KNEE ARTHROSCOPY Left 2006   LAPAROSCOPIC APPENDECTOMY N/A 01/13/2021   Procedure: APPENDECTOMY LAPAROSCOPIC WITH LYSIS OF ADHESIONS;  Surgeon: Sheldon Standing, MD;  Location: [facility];  Service: General;  Laterality: N/A;   LUMBAR MICRODISCECTOMY     POLYPECTOMY     SKIN BIOPSY Left 08/11/2021   nodulocystic fat nercrosis   TOTAL KNEE ARTHROPLASTY Left 11/11/2020   Procedure: LEFT TOTAL KNEE ARTHROPLASTY;  Surgeon: Barbarann Oneil BROCKS, MD;  Location: [facility] OR;  Service: Orthopedics;  Laterality: Left;   UMBILICAL HERNIA REPAIR     Patient Active Problem List    Diagnosis Date Noted   Muscle spasticity 03/13/2024   Abnormality of gait 03/13/2024   Anxiety 01/12/2024   Chronic low back pain 01/12/2024   Acute embolism and thrombosis of unspecified deep veins of unspecified lower extremity (HCC) 01/12/2024   Coping style affecting medical condition 12/15/2023   Myelopathy concurrent with and due to spinal stenosis of thoracic region [facility]) 12/06/2023   Spinal stenosis 12/06/2023   Thoracic myelopathy 12/06/2023   Morbid obesity (HCC) 12/06/2023   Depression 12/06/2023   Radiculopathy, lumbar region 11/10/2023   Flexion contracture of knee, left 03/01/2023   Prediabetes 11/19/2022   Status post laparoscopic appendectomy 01/14/2021   H/O total knee replacement, left 11/11/2020   Leiomyoma 11/08/2020   Arthritis 09/26/2018   Onychomycosis 09/26/2018   Hyperlipidemia LDL goal <100 08/31/2016   OSA on CPAP 08/31/2016   Premature ejaculation 08/19/2011    ONSET DATE: 12/16/2023  REFERRING DIAG: M47.14 (ICD-10-CM) - Thoracic myelopathy  THERAPY DIAG:  Muscle weakness (generalized)  Unsteadiness on feet  Other abnormalities of gait and mobility  Other lack of coordination  Pain in thoracic spine  Thoracic myelopathy  Rationale for Evaluation and Treatment: Rehabilitation  SUBJECTIVE:  SUBJECTIVE STATEMENT:   Presents to [facility] alone w/ rollator.  Denies falls, but having some low to moderate low back pain today.  He reports wanting to add aquatic visit for next Thursday.  Pt accompanied by: self  PERTINENT HISTORY: Status post T3 transpedicular decompression, T1-T4 PSF 12/07/2023 due to severe thoracic myelopathy with progressive ambulatory functional loss per Dr. Penne Sharps neurosurgery.NO BRACE REQUIRED. Continues to have some pain in his mid back up  to his neck. He does feel as though the weakness in his legs is improving however he continues to use a rollator. No new weakness, numbness or tingling.Of note his operative course was complicated after shown to have a DVT postoperatively on day 3. He continues to take Eliquis  for this   Got inpatient rehab and was discharged home 12/20/23  PMH: L knee arthritis, HLD, HTN, L knee replacement 2022  PAIN:  Are you having pain? Yes: NPRS scale: 4 Pain location: midback Pain description: sore/ache/tight Aggravating factors: just there Relieving factors: moving/stretching   LUE in sitting: There were no vitals filed for this visit.    PRECAUTIONS: Back  Precaution/Restrictions Comments: spinal precautions, no brace needed per orders - spinal precautions lifted at 8/13 visit - can lift up to 25 lbs, can bend and twist to tolerance  FALLS: Has patient fallen in last 6 months? Yes. Number of falls 20, did not injure himself, legs were weak and didn't have strength in his legs  LIVING ENVIRONMENT: Lives with: lives with their spouse Lives in: House/apartment Stairs: Yes: External: 2 steps; none Has following equipment at home: Single point cane, Walker - 2 wheeled, Environmental consultant - 4 wheeled, shower chair, and raised grab bars  PLOF: Independent, Vocation/Vocational requirements: Estate agent , and Leisure: Working out, taking walks   PATIENT GOALS: Wants to get back to being independent, getting his strength back and walking on his own   OBJECTIVE:  Note: Objective measures were completed at Evaluation unless otherwise noted.  COGNITION: Overall cognitive status: Within functional limits for tasks assessed   SENSATION: Light touch: WFL Proprioception: WFL and with ankle DF/PF, noted some bouts of clonus bilat when testing into ankle DF   Pt reporting numbness/tingling in L knee and both feet have numbness/tingling and coldness  Sometimes if he rubs up against something will feel  warmth   COORDINATION: Heel to shin: slightly more difficult with LLE    POSTURE: No Significant postural limitations  LOWER EXTREMITY ROM:    Pt with limited L knee extension/flexion ROM due to hx of L knee replacement   LOWER EXTREMITY MMT:    MMT Right Eval Left Eval  Hip flexion 4+ 4-  Hip extension    Hip abduction 4+ 4+  Hip adduction 5 5  Hip internal rotation    Hip external rotation    Knee flexion 5 4+  Knee extension 4+ 4-  Ankle dorsiflexion 5 5  Ankle plantarflexion    Ankle inversion    Ankle eversion    (Blank rows = not tested)  All tested in sitting   BED MOBILITY:  Pt reporting no difficulties, performing the log roll   TRANSFERS: Sit to stand: SBA  Assistive device utilized: None     Stand to sit: SBA  Assistive device utilized: None      Able to perform with no UE support, wide BOS with LLE staggered behind R  Pt was very pleased with this, did not realize he could perform without his hands  GAIT: Findings: Gait Characteristics: step through pattern, decreased stride length, and narrow BOS, Distance walked: Clinic distances, Assistive device utilized:Single point cane and Walker - 2 wheeled, Level of assistance: Modified independence, SBA, and CGA, and Comments: mod I with RW, SBA/CGA with SPC   FUNCTIONAL TESTS:  5 times sit to stand: 17.8 seconds with no UE support  Timed up and go (TUG): 16.5 seconds with RW, 20.1 seconds with SPC  10 meter walk test: 17.8 seconds with RW = 1.84 ft/sec                                                                                                                               TREATMENT DATE: 03/23/24 Aquatic therapy at [facility] - pool temperature 91 degrees   Patient seen for aquatic therapy today.  Treatment took place in water  3.6-4.8 feet deep depending upon activity.  Patient entered and exited the pool via stairs reciprocally w/ bil rails at mod I level.   Exercises: -Water  walking warmup 4x18  ft forwards > backwards > sideways no support -Resisted marching using water  paddles 2x18 ft > rotation march w/ resistance using water  paddles 2x18 ft -hip hinge w/ aquatic barbell for modified open book x10 each side -2x10 aquatic barbell RDL (added 2x5.5lb ankle wts to bar for second round), cues for proximity to shin -B stance RDL w/ blue high resistance dumbbells 2x10 each LE -Suitcase lunge x10 each LE using blue high resistance dumbbells -5 step reverse lunge into side lunge working away from UE support 2x5 each direction each LE -Bilateral blue high resistance dumbbell row in deep water  2x18 ft  Patient requires buoyancy of the water  for support for reduced fall risk with gait training and balance exercises with no more than SBA support and verbal cuing. Exercises able to be performed safely in water  without the risk of fall compared to those same exercises performed on land; viscosity of water  needed for resistance for strengthening. Current of water  provides perturbations for challenging static and dynamic balance.    PATIENT EDUCATION: Education details: Continue HEP, contacted front office about adding pt to held 1015 aquatic slot 10/30.  Will provide community pool list, pt to call [employer] to confirmed if [employer] offers a discount for membership. Person educated: Patient, pt's spouse  Education method: Explanation, Demonstration, and Verbal cues Education comprehension: verbalized understanding and returned demonstration  HOME EXERCISE PROGRAM:  Access Code: [email or web address] URL: [email or web address] Date: 02/15/2024 Prepared by: Sheffield Senate  Exercises - Supine Hamstring Stretch with Strap  - 1 x daily - 7 x weekly - 3 sets - 10 reps - Supine Heel Slide with Strap  - 1 x daily - 7 x weekly - 3 sets - 10 reps - Supine 90/90 Alternating Toe Touch  - 1 x daily - 7 x weekly - 3 sets - 10 reps - Dead Bug  - 1 x daily - 7 x weekly - 3 sets - 10 reps -  Mini Squats with  Walker and Chair  - 1 x daily - 7 x weekly - 4 sets - 10 reps - Side Stepping with Resistance at Ankles and Counter Support  - 1 x daily - 7 x weekly - 3 sets - 10 reps - Forward and Backward Monster Walk with Counter Support  - 1 x daily - 7 x weekly - 3 sets - 10 reps - Supine Figure 4 Piriformis Stretch  - 1 x daily - 7 x weekly - 2 sets - 30 hold - Modified Thomas Stretch  - 1 x daily - 7 x weekly - 3 sets - 60 hold - Sidelying Hip Abduction  - 1 x daily - 5 x weekly - 2 sets - 10 reps - Single Leg Bridge  - 1 x daily - 5 x weekly - 1 sets - 10 reps -Verbally added: standing hip ABD and marching with blue t-band   FOR AQUATIC USE: Access Code: [email or web address] URL: [email or web address] Date: 02/02/2024 Prepared by: Daved Bull  Exercises - Push-Up on Pool Wall  - 1 x daily - 7 x weekly - 3 sets - 10 reps - Forward and Backward Stepping at [employer]  - 1 x daily - 7 x weekly - 3 sets - 10 reps - Asymmetrical Shoulder Flexion Extension with Hand Floats  - 1 x daily - 7 x weekly - 3 sets - 10 reps - Heel Toe Raises at Pool Wall  - 1 x daily - 7 x weekly - 3 sets - 10 reps - Lateral Stepping at Pool Wall  - 1 x daily - 7 x weekly - 3 sets - 10 reps - Standing Hip Circles at [employer]  - 1 x daily - 7 x weekly - 3 sets - 10 reps - Lunge to Target at [employer]  - 1 x daily - 7 x weekly - 3 sets - 10 reps - Plank with Hip Extension at [employer]  - 1 x daily - 7 x weekly - 3 sets - 10 reps - Standing 3-Way Kick with Ankle Float at [employer]  - 1 x daily - 7 x weekly - 3 sets - 10 reps - Sitting Balance on Pool Noodle  - 1 x daily - 7 x weekly - 3 sets - 10 reps   GOALS: Goals reviewed with patient? Yes  SHORT TERM GOALS: Target date: 01/31/2024  Pt will be independent with initial HEP in order to build upon functional gains made in therapy. Baseline:  IND (9/3) Goal status: MET  2.  Pt will improve gait speed with LRAD to at least 2.2 ft/sec in order to demo improved  community mobility.   Baseline: 17.8 seconds with RW = 1.84 ft/sec; 2.90 ft/sec w/ rollator (9/3) Goal status: MET  3.  Pt will improve TUG time to 17 seconds or less with SPC in order to demo decrease fall risk.  Baseline: 20.1 seconds with SPC; 16.29 sec w/ SPC SBA (9/3) Goal status: MET   LONG TERM GOALS: Target date: 02/21/2024  Pt will be independent with initial HEP in order to build upon functional gains made in therapy. Baseline: pt has been consistently performing HEP  Goal status: MET  2. Pt will improve BERG to at least a 46/56 in order to demo decr fall risk.  Baseline: 38/56  51/56 (9/30) Goal status: MET  3.   Pt will improve TUG time to 13.5 seconds or less with SPC/no AD in order  to demo decrease fall risk. Baseline:  20.1 seconds with SPC; 16.29 sec w/ SPC SBA (9/3)  14.6 seconds with SPC (9/30) Goal status: NOT MET  4.  Pt will improve gait speed with LRAD to at least 3.1 ft/sec in order to demo improved community mobility.  Baseline: 17.8 seconds with RW = 1.84 ft/sec; 2.90 ft/sec w/ rollator (9/3)  13.9 seconds with SPC = 2.34 ft/sec  12.1 seconds with rollator = 2.1 ft/sec Goal status: NOT MET  5.  Pt will improve 5x sit<>stand to less than or equal to 14 sec to demonstrate improved functional strength and transfer efficiency.  Baseline: 17.8 seconds with no UE support  12.1 seconds with no UE support (9/30) Goal status: MET  6.  Pt will ambulate at least 500' outdoors on unlevel surfaces with LRAD in order to demo improved community mobility.  Baseline: unable to check during session due to rainy weather  Goal status: N/A  UPDATED/REVISED GOALS FOR RE-CERT:  LONG TERM GOALS: Target date: 03/28/2024  Pt will be independent with final HEP for land/aquatic therapy in order to build upon functional gains made in therapy. Baseline: pt has been consistently performing land HEP, will benefit from further updates/revisions  Goal status: REVISED  2.  Pt will improve FGA to at least a 21/30 in order to demo decr fall risk. Baseline: 16/30 Goal status: NEW  3.   Pt will improve TUG time to 13.5 seconds or less with SPC/no AD in order to demo decrease fall risk. Baseline:  20.1 seconds with SPC; 16.29 sec w/ SPC SBA (9/3)  14.6 seconds with SPC (9/30) Goal status: ON-GOING  4.  Pt will improve gait speed with LRAD to at least 2.6 ft/sec in order to demo improved community mobility.  Baseline: 17.8 seconds with RW = 1.84 ft/sec; 2.90 ft/sec w/ rollator (9/3)  13.9 seconds with SPC = 2.34 ft/sec  12.1 seconds with rollator = 2.1 ft/sec Goal status: REVISED     ASSESSMENT:  CLINICAL IMPRESSION: Pt has made excellent progress in aquatic environment and he is to be discharged at last aquatic visit next Thursday based on overall progress.  He continues to work on increased thoracolumbar mobility and pain management, but has made marked strides in LE strength and motor control.  Left hip remains weaker than right noted during lunge variations today.  He was encouraged to incorporate staggered stance and SLS into gym/home routine to improve this.  He will be provided more information on community gyms next visit as he is attempting to establish membership to continue in this setting independently as is appropriate.  Continue per POC.  OBJECTIVE IMPAIRMENTS: Abnormal gait, decreased activity tolerance, decreased balance, decreased coordination, decreased mobility, difficulty walking, decreased ROM, decreased strength, impaired flexibility, impaired sensation, postural dysfunction, and pain.   ACTIVITY LIMITATIONS: carrying, lifting, bending, stairs, transfers, and locomotion level  PARTICIPATION LIMITATIONS: shopping, community activity, occupation, and yard work  PERSONAL FACTORS: Behavior pattern, Past/current experiences, Time since onset of injury/illness/exacerbation, and 3+ comorbidities: L knee arthritis, HLD, HTN, L knee replacement  2022 are also affecting patient's functional outcome.   REHAB POTENTIAL: Good  CLINICAL DECISION MAKING: Evolving/moderate complexity  EVALUATION COMPLEXITY: Moderate  PLAN:  PT FREQUENCY: 2x/week  PT DURATION: 8 weeks plus an additional 2x week for 4 weeks per re-cert   PLANNED INTERVENTIONS: [id number]- PT Re-evaluation, 97110-Therapeutic exercises, 97530- Therapeutic activity, [id number]- Neuromuscular re-education, 97535- Self Care, [id number]- Manual therapy, [id number]- Gait training, [phone]- Aquatic Therapy, Patient/Family education, Balance training,  Stair training, and DME instructions  PLAN FOR NEXT SESSION:  Check goals, finalize land HEP and plan to D/C   Aquatics: Work on gait, balance, functional strength    Daved KATHEE Bull, PT, DPT 03/23/2024, 11:22 AM

## 2024-03-28 ENCOUNTER — Ambulatory Visit: Admitting: Physical Therapy

## 2024-03-28 VITALS — BP 141/97 | HR 85

## 2024-03-28 DIAGNOSIS — M546 Pain in thoracic spine: Secondary | ICD-10-CM | POA: Diagnosis not present

## 2024-03-28 DIAGNOSIS — M4714 Other spondylosis with myelopathy, thoracic region: Secondary | ICD-10-CM | POA: Diagnosis not present

## 2024-03-28 DIAGNOSIS — R2689 Other abnormalities of gait and mobility: Secondary | ICD-10-CM | POA: Diagnosis not present

## 2024-03-28 DIAGNOSIS — R278 Other lack of coordination: Secondary | ICD-10-CM | POA: Diagnosis not present

## 2024-03-28 DIAGNOSIS — M6281 Muscle weakness (generalized): Secondary | ICD-10-CM

## 2024-03-28 DIAGNOSIS — R2681 Unsteadiness on feet: Secondary | ICD-10-CM | POA: Diagnosis not present

## 2024-03-28 NOTE — Therapy (Addendum)
 OUTPATIENT PHYSICAL THERAPY NEURO TREATMENT   Patient Name: Corey [name] MRN: [id number] DOB:Apr 19, 1966, 58 y.o., male Today's Date: 03/28/2024   PCP: Joyce Norleen BROCKS, MD REFERRING PROVIDER: Pegge Toribio PARAS, PA-C  END OF SESSION:  PT End of Session - 03/28/24 1116     Visit Number 21    Number of Visits 21    Date for Recertification  03/30/24   per re-cert on 0/69/74   Authorization Type [employer]    PT Start Time [phone]    PT Stop Time 0930    PT Time Calculation (min) 43 min    Equipment Utilized During Treatment Gait belt    Activity Tolerance Patient tolerated treatment well    Behavior During Therapy WFL for tasks assessed/performed            Past Medical History:  Diagnosis Date   Arthritis    knee, left   ED (erectile dysfunction)    Hyperlipidemia    Hypertension    Sleep apnea    CPAP - not using lately due to recall    Past Surgical History:  Procedure Laterality Date   ANKLE BONE SURGERY     APPLICATION OF INTRAOPERATIVE CT SCAN  12/07/2023   Procedure: APPLICATION OF INTRAOPERATIVE CT SCAN;  Surgeon: Claudene Penne ORN, MD;  Location: [facility] ORS;  Service: Neurosurgery;;   BACK SURGERY     COLLAR BONE SURGEY     COLONOSCOPY     FRACTURE SURGERY     HERNIA REPAIR     IR IVC FILTER PLMT / S&I /IMG GUID/MOD SED  12/12/2023   KNEE ARTHROSCOPY Left 2006   LAPAROSCOPIC APPENDECTOMY N/A 01/13/2021   Procedure: APPENDECTOMY LAPAROSCOPIC WITH LYSIS OF ADHESIONS;  Surgeon: Sheldon Standing, MD;  Location: [facility];  Service: General;  Laterality: N/A;   LUMBAR MICRODISCECTOMY     POLYPECTOMY     SKIN BIOPSY Left 08/11/2021   nodulocystic fat nercrosis   TOTAL KNEE ARTHROPLASTY Left 11/11/2020   Procedure: LEFT TOTAL KNEE ARTHROPLASTY;  Surgeon: Barbarann Oneil BROCKS, MD;  Location: [facility] OR;  Service: Orthopedics;  Laterality: Left;   UMBILICAL HERNIA REPAIR     Patient Active Problem List   Diagnosis Date Noted   Muscle spasticity 03/13/2024    Abnormality of gait 03/13/2024   Anxiety 01/12/2024   Chronic low back pain 01/12/2024   Acute embolism and thrombosis of unspecified deep veins of unspecified lower extremity (HCC) 01/12/2024   Coping style affecting medical condition 12/15/2023   Myelopathy concurrent with and due to spinal stenosis of thoracic region [facility]) 12/06/2023   Spinal stenosis 12/06/2023   Thoracic myelopathy 12/06/2023   Morbid obesity (HCC) 12/06/2023   Depression 12/06/2023   Radiculopathy, lumbar region 11/10/2023   Flexion contracture of knee, left 03/01/2023   Prediabetes 11/19/2022   Status post laparoscopic appendectomy 01/14/2021   H/O total knee replacement, left 11/11/2020   Leiomyoma 11/08/2020   Arthritis 09/26/2018   Onychomycosis 09/26/2018   Hyperlipidemia LDL goal <100 08/31/2016   OSA on CPAP 08/31/2016   Premature ejaculation 08/19/2011    ONSET DATE: 12/16/2023  REFERRING DIAG: M47.14 (ICD-10-CM) - Thoracic myelopathy  THERAPY DIAG:  Muscle weakness (generalized)  Unsteadiness on feet  Other abnormalities of gait and mobility  Rationale for Evaluation and Treatment: Rehabilitation  SUBJECTIVE:  SUBJECTIVE STATEMENT:  Patient presents to clinic with wife and ambulates in with rollator. Denies falls. Patient states that he continues to feel very tight and stiff in legs and back and that the increase in baclofen  didn't seem to help this issue.  Pt accompanied by: self  PERTINENT HISTORY: Status post T3 transpedicular decompression, T1-T4 PSF 12/07/2023 due to severe thoracic myelopathy with progressive ambulatory functional loss per Dr. Penne Sharps neurosurgery.NO BRACE REQUIRED. Continues to have some pain in his mid back up to his neck. He does feel as though the weakness in his legs is  improving however he continues to use a rollator. No new weakness, numbness or tingling.Of note his operative course was complicated after shown to have a DVT postoperatively on day 3. He continues to take Eliquis  for this   Got inpatient rehab and was discharged home 12/20/23  PMH: L knee arthritis, HLD, HTN, L knee replacement 2022  PAIN:  Are you having pain? Yes: NPRS scale: 4 Pain location: midback Pain description: sore/ache/tight Aggravating factors: just there Relieving factors: moving/stretching   LUE in sitting: Vitals:   03/28/24 0852  BP: (!) 141/97  Pulse: 85      PRECAUTIONS: Back  Precaution/Restrictions Comments: spinal precautions, no brace needed per orders - spinal precautions lifted at 8/13 visit - can lift up to 25 lbs, can bend and twist to tolerance  FALLS: Has patient fallen in last 6 months? Yes. Number of falls 20, did not injure himself, legs were weak and didn't have strength in his legs  LIVING ENVIRONMENT: Lives with: lives with their spouse Lives in: House/apartment Stairs: Yes: External: 2 steps; none Has following equipment at home: Single point cane, Walker - 2 wheeled, Environmental Consultant - 4 wheeled, shower chair, and raised grab bars  PLOF: Independent, Vocation/Vocational requirements: Estate agent , and Leisure: Working out, taking walks   PATIENT GOALS: Wants to get back to being independent, getting his strength back and walking on his own   OBJECTIVE:  Note: Objective measures were completed at Evaluation unless otherwise noted.  COGNITION: Overall cognitive status: Within functional limits for tasks assessed   SENSATION: Light touch: WFL Proprioception: WFL and with ankle DF/PF, noted some bouts of clonus bilat when testing into ankle DF   Pt reporting numbness/tingling in L knee and both feet have numbness/tingling and coldness  Sometimes if he rubs up against something will feel warmth   COORDINATION: Heel to shin: slightly  more difficult with LLE    POSTURE: No Significant postural limitations  LOWER EXTREMITY ROM:    Pt with limited L knee extension/flexion ROM due to hx of L knee replacement   LOWER EXTREMITY MMT:    MMT Right Eval Left Eval  Hip flexion 4+ 4-  Hip extension    Hip abduction 4+ 4+  Hip adduction 5 5  Hip internal rotation    Hip external rotation    Knee flexion 5 4+  Knee extension 4+ 4-  Ankle dorsiflexion 5 5  Ankle plantarflexion    Ankle inversion    Ankle eversion    (Blank rows = not tested)  All tested in sitting   BED MOBILITY:  Pt reporting no difficulties, performing the log roll   TRANSFERS: Sit to stand: SBA  Assistive device utilized: None     Stand to sit: SBA  Assistive device utilized: None      Able to perform with no UE support, wide BOS with LLE staggered behind R  Pt was  very pleased with this, did not realize he could perform without his hands   GAIT: Findings: Gait Characteristics: step through pattern, decreased stride length, and narrow BOS, Distance walked: Clinic distances, Assistive device utilized:Single point cane and Walker - 2 wheeled, Level of assistance: Modified independence, SBA, and CGA, and Comments: mod I with RW, SBA/CGA with SPC   FUNCTIONAL TESTS:  5 times sit to stand: 17.8 seconds with no UE support  Timed up and go (TUG): 16.5 seconds with RW, 20.1 seconds with SPC  10 meter walk test: 17.8 seconds with RW = 1.84 ft/sec                                                                                                                               TREATMENT DATE: 03/23/24  Therapeutic activity: Patient education on HEP, HEP review Goal assessments: - FGA: 17/30 - Timed up and go: 13.72 seconds w/ cane, 13.56 seconds no AD - Gait speed: 3.47 seconds with SPC = 2.4 ft/sec 11.62 seconds with rollator = 2.8 ft/sec   [facility] PT Assessment - 03/28/24 0001       Functional Gait  Assessment   Gait assessed  Yes    Gait  Level Surface Walks 20 ft, slow speed, abnormal gait pattern, evidence for imbalance or deviates 10-15 in outside of the 12 in walkway width. Requires more than 7 sec to ambulate 20 ft.    Change in Gait Speed Able to change speed, demonstrates mild gait deviations, deviates 6-10 in outside of the 12 in walkway width, or no gait deviations, unable to achieve a major change in velocity, or uses a change in velocity, or uses an assistive device.    Gait with Horizontal Head Turns Performs head turns smoothly with slight change in gait velocity (eg, minor disruption to smooth gait path), deviates 6-10 in outside 12 in walkway width, or uses an assistive device.    Gait with Vertical Head Turns Performs task with slight change in gait velocity (eg, minor disruption to smooth gait path), deviates 6 - 10 in outside 12 in walkway width or uses assistive device    Gait and Pivot Turn Pivot turns safely in greater than 3 sec and stops with no loss of balance, or pivot turns safely within 3 sec and stops with mild imbalance, requires small steps to catch balance.    Step Over Obstacle Is able to step over one shoe box (4.5 in total height) but must slow down and adjust steps to clear box safely. May require verbal cueing.    Gait with Narrow Base of Support Ambulates 4-7 steps.    Gait with Eyes Closed Walks 20 ft, uses assistive device, slower speed, mild gait deviations, deviates 6-10 in outside 12 in walkway width. Ambulates 20 ft in less than 9 sec but greater than 7 sec.    Ambulating Backwards Walks 20 ft, uses assistive device, slower speed, mild gait deviations, deviates  6-10 in outside 12 in walkway width.    Steps Alternating feet, must use rail.    Total Score 17    FGA comment: performed w/SPC           NMR: - Foam balance beam side stepping in parallel bars, down&back x 4 rounds, CGA - Foam balance beam side stepping with ball throw in parallel bars, down&back x 2 rounds, CGA - Foam balance  beam tandem walking in parallel bars, down&back x 4 rounds, CGA *some LOB with correction needed during balance tasks     PATIENT EDUCATION: Education details: Provided pool list, hep review and continue at home. Person educated: Patient,  Education method: Explanation, Demonstration, and Verbal cues Education comprehension: verbalized understanding  HOME EXERCISE PROGRAM:  Access Code: [email or web address] URL: [email or web address] Date: 02/15/2024 Prepared by: Sheffield Senate  Exercises - Supine Hamstring Stretch with Strap  - 1 x daily - 7 x weekly - 3 sets - 10 reps - Supine Heel Slide with Strap  - 1 x daily - 7 x weekly - 3 sets - 10 reps - Supine 90/90 Alternating Toe Touch  - 1 x daily - 7 x weekly - 3 sets - 10 reps - Dead Bug  - 1 x daily - 7 x weekly - 3 sets - 10 reps - Mini Squats with Walker and Chair  - 1 x daily - 7 x weekly - 4 sets - 10 reps - Side Stepping with Resistance at Ankles and Counter Support  - 1 x daily - 7 x weekly - 3 sets - 10 reps - Forward and Backward Monster Walk with Counter Support  - 1 x daily - 7 x weekly - 3 sets - 10 reps - Supine Figure 4 Piriformis Stretch  - 1 x daily - 7 x weekly - 2 sets - 30 hold - Modified Thomas Stretch  - 1 x daily - 7 x weekly - 3 sets - 60 hold - Sidelying Hip Abduction  - 1 x daily - 5 x weekly - 2 sets - 10 reps - Single Leg Bridge  - 1 x daily - 5 x weekly - 1 sets - 10 reps -Verbally added: standing hip ABD and marching with blue t-band   FOR AQUATIC USE: Access Code: [email or web address] URL: [email or web address] Date: 02/02/2024 Prepared by: Daved Bull  Exercises - Push-Up on Pool Wall  - 1 x daily - 7 x weekly - 3 sets - 10 reps - Forward and Backward Stepping at [employer]  - 1 x daily - 7 x weekly - 3 sets - 10 reps - Asymmetrical Shoulder Flexion Extension with Hand Floats  - 1 x daily - 7 x weekly - 3 sets - 10 reps - Heel Toe Raises at Pool Wall  - 1 x daily - 7 x weekly - 3 sets  - 10 reps - Lateral Stepping at Pool Wall  - 1 x daily - 7 x weekly - 3 sets - 10 reps - Standing Hip Circles at [employer]  - 1 x daily - 7 x weekly - 3 sets - 10 reps - Lunge to Target at [employer]  - 1 x daily - 7 x weekly - 3 sets - 10 reps - Plank with Hip Extension at [employer]  - 1 x daily - 7 x weekly - 3 sets - 10 reps - Standing 3-Way Kick with Ankle Float at [employer]  - 1 x daily - 7 x weekly - 3  sets - 10 reps - Sitting Balance on Pool Noodle  - 1 x daily - 7 x weekly - 3 sets - 10 reps   GOALS: Goals reviewed with patient? Yes  SHORT TERM GOALS: Target date: 01/31/2024  Pt will be independent with initial HEP in order to build upon functional gains made in therapy. Baseline:  IND (9/3) Goal status: MET  2.  Pt will improve gait speed with LRAD to at least 2.2 ft/sec in order to demo improved community mobility.   Baseline: 17.8 seconds with RW = 1.84 ft/sec; 2.90 ft/sec w/ rollator (9/3) Goal status: MET  3.  Pt will improve TUG time to 17 seconds or less with SPC in order to demo decrease fall risk.  Baseline: 20.1 seconds with SPC; 16.29 sec w/ SPC SBA (9/3) Goal status: MET   LONG TERM GOALS: Target date: 02/21/2024  Pt will be independent with initial HEP in order to build upon functional gains made in therapy. Baseline: pt has been consistently performing HEP  Goal status: MET  2. Pt will improve BERG to at least a 46/56 in order to demo decr fall risk.  Baseline: 38/56  51/56 (9/30) Goal status: MET  3.   Pt will improve TUG time to 13.5 seconds or less with SPC/no AD in order to demo decrease fall risk. Baseline:  20.1 seconds with SPC; 16.29 sec w/ SPC SBA (9/3)  14.6 seconds with SPC (9/30) Goal status: NOT MET  4.  Pt will improve gait speed with LRAD to at least 3.1 ft/sec in order to demo improved community mobility.  Baseline: 17.8 seconds with RW = 1.84 ft/sec; 2.90 ft/sec w/ rollator (9/3)  13.9 seconds with SPC = 2.34 ft/sec  12.1 seconds  with rollator = 2.1 ft/sec Goal status: NOT MET  5.  Pt will improve 5x sit<>stand to less than or equal to 14 sec to demonstrate improved functional strength and transfer efficiency.  Baseline: 17.8 seconds with no UE support  12.1 seconds with no UE support (9/30) Goal status: MET  6.  Pt will ambulate at least 500' outdoors on unlevel surfaces with LRAD in order to demo improved community mobility.  Baseline: unable to check during session due to rainy weather  Goal status: N/A  UPDATED/REVISED GOALS FOR RE-CERT:  LONG TERM GOALS: Target date: 03/28/2024  Pt will be independent with final HEP for land/aquatic therapy in order to build upon functional gains made in therapy. Baseline: pt has been consistently performing land HEP, will benefit from further updates/revisions  Goal status: REVISED  2. Pt will improve FGA to at least a 21/30 in order to demo decr fall risk. Baseline: 16/30 03/28/2024:17/30 Goal status: NOT MET  3.   Pt will improve TUG time to 13.5 seconds or less with SPC/no AD in order to demo decrease fall risk. Baseline:  20.1 seconds with SPC; 16.29 sec w/ SPC SBA (9/3)  14.6 seconds with SPC (9/30) 03/28/2024: 13.72 seconds w/ cane, 13.56 seconds no AD Goal status: MET  4.  Pt will improve gait speed with LRAD to at least 2.6 ft/sec in order to demo improved community mobility.  Baseline: 17.8 seconds with RW = 1.84 ft/sec; 2.90 ft/sec w/ rollator (9/3)  13.9 seconds with SPC = 2.34 ft/sec 12.1 seconds with rollator = 2.1 ft/sec 03/28/2024: 13.47 seconds with SPC = 2.4 ft/sec 11.62 seconds with rollator = 2.8 ft/sec Goal status: MET     ASSESSMENT:  CLINICAL IMPRESSION: In today's session, patient made  progress during reassessment and goals checked. Pt is to be discharged at upcoming aquatic visit. He continues to have stiffness throughout the body and will mention this to physician as he hasn't noticed any difference since baclofen  was increased.  HEP was reviewed and pool recommendations were provided in order to continue exercise. Patient also attends a gym and will continue HEP at home/gym. Although patient is still at risk for falls, patient does show improvements in timed up and go and gait speed, which demonstrates reduced risk for falls. Timed up and go was performed without use of AD and SBA which shows improvement from use of SPC. Balance exercise was continued with use of compliant surface to improve dynamic tasks and balance. Patient shows improvement overall and will continue exercise at home. Continue per POC.  OBJECTIVE IMPAIRMENTS: Abnormal gait, decreased activity tolerance, decreased balance, decreased coordination, decreased mobility, difficulty walking, decreased ROM, decreased strength, impaired flexibility, impaired sensation, postural dysfunction, and pain.   ACTIVITY LIMITATIONS: carrying, lifting, bending, stairs, transfers, and locomotion level  PARTICIPATION LIMITATIONS: shopping, community activity, occupation, and yard work  PERSONAL FACTORS: Behavior pattern, Past/current experiences, Time since onset of injury/illness/exacerbation, and 3+ comorbidities: L knee arthritis, HLD, HTN, L knee replacement 2022 are also affecting patient's functional outcome.   REHAB POTENTIAL: Good  CLINICAL DECISION MAKING: Evolving/moderate complexity  EVALUATION COMPLEXITY: Moderate  PLAN:  PT FREQUENCY: 2x/week  PT DURATION: 8 weeks plus an additional 2x week for 4 weeks per re-cert   PLANNED INTERVENTIONS: [id number]- PT Re-evaluation, 97110-Therapeutic exercises, 97530- Therapeutic activity, 97112- Neuromuscular re-education, 97535- Self Care, [id number]- Manual therapy, [phone]- Gait training, [phone]- Aquatic Therapy, Patient/Family education, Balance training, Stair training, and DME instructions  PLAN FOR NEXT SESSION:  Check goals, finalize land HEP and plan to D/C at pool appt   Aquatics: Work on gait, balance, functional  strength    Emmalene Sherry, Student-PT, DPT 03/28/2024, 11:17 AM

## 2024-03-29 ENCOUNTER — Telehealth: Payer: Self-pay | Admitting: Physical Medicine and Rehabilitation

## 2024-03-29 NOTE — Telephone Encounter (Signed)
 Wrote letter for pt for disability-

## 2024-03-30 ENCOUNTER — Ambulatory Visit: Admitting: Physical Therapy

## 2024-04-02 DIAGNOSIS — L200 Besnier's prurigo: Secondary | ICD-10-CM | POA: Diagnosis not present

## 2024-04-03 ENCOUNTER — Encounter: Payer: Self-pay | Admitting: Radiology

## 2024-04-17 ENCOUNTER — Telehealth: Payer: Self-pay | Admitting: Physical Medicine and Rehabilitation

## 2024-04-17 ENCOUNTER — Encounter: Admitting: Orthopedic Surgery

## 2024-04-17 NOTE — Telephone Encounter (Signed)
 This pt came up here to check on paperwork he dropped off on 10/24. Do you have this paperwork I didn't see it up here at the front?

## 2024-04-18 ENCOUNTER — Encounter: Payer: Self-pay | Admitting: Physician Assistant

## 2024-06-12 ENCOUNTER — Encounter: Attending: Registered Nurse | Admitting: Physical Medicine and Rehabilitation

## 2024-06-12 ENCOUNTER — Encounter: Payer: Self-pay | Admitting: Physical Medicine and Rehabilitation

## 2024-06-12 VITALS — BP 147/96 | HR 104 | Ht 69.0 in | Wt 256.0 lb

## 2024-06-12 DIAGNOSIS — M4804 Spinal stenosis, thoracic region: Secondary | ICD-10-CM | POA: Insufficient documentation

## 2024-06-12 DIAGNOSIS — M4714 Other spondylosis with myelopathy, thoracic region: Secondary | ICD-10-CM | POA: Insufficient documentation

## 2024-06-12 DIAGNOSIS — Z95828 Presence of other vascular implants and grafts: Secondary | ICD-10-CM | POA: Diagnosis not present

## 2024-06-12 DIAGNOSIS — M62838 Other muscle spasm: Secondary | ICD-10-CM | POA: Diagnosis not present

## 2024-06-12 DIAGNOSIS — G992 Myelopathy in diseases classified elsewhere: Secondary | ICD-10-CM | POA: Insufficient documentation

## 2024-06-12 DIAGNOSIS — I82403 Acute embolism and thrombosis of unspecified deep veins of lower extremity, bilateral: Secondary | ICD-10-CM | POA: Diagnosis not present

## 2024-06-12 NOTE — Patient Instructions (Addendum)
 Pt is a 59 yr old male with thoracic myelopathy. Status post T3 transpedicular decompression, T1-T4 PSF 12/07/2023 per Dr. Penne Sharps neurosurgery.He also has associated myelopathy, R proximal peroneal vein DVT- has IVC filter; also has : OSA, HTN,  Here for f/u on Thoracic myelopathy  Needs to get IVC filter removed- referral to Interventional radiology- for removal.  -It's OK to come off Eliquis  for IR IVC removal.   2. Will place referral for Dopplers of LE's/legs to determine if still has clots  3. Once Dopplers come back, can stop Eliquis - has been 6 months.   4. Con't baclofen - doesn't need refills- filled 03/13/24.   5. Strength exam has stabilized-  but still by end of day, tripping more and more situations where fatigues and cannot walk as far.    6. F/U in 3 months- single appt.   7. Discussed intimacy- not an issue.  Might be worth checking with PCP about changing Paxil  because of interest.

## 2024-06-12 NOTE — Progress Notes (Signed)
 "  Subjective:    Patient ID: Corey [name], male    DOB: 05/28/1966, 59 y.o.   MRN: [id number]  HPI  Pt is a 59 yr old male with thoracic myelopathy. Status post T3 transpedicular decompression, T1-T4 PSF 12/07/2023 per Dr. Penne Sharps neurosurgery.He also has associated myelopathy, R proximal peroneal vein DVT- has IVC filter; also has : OSA, HTN,  Here for f/u on Thoracic myelopathy   Increase in Baclofen  did help - 10 mg QID.   Letter worked well- working on retirement disability- was approved for SSI.   Pain not much different than when saw me last- but was rating 4/10 last time and 5/10 now.     IVC filter-  Was placed via neck- needs to come out Still on Eliquis -   No swelling in LE's- except L knee.   Still walking with cane.  L knee giving him most of the pain-  takes pain quil-  but nothing else- doesn't work to take NSAIDs or tylenol .   [place] [facility], [facility] [place] [facility].  Has had injection in L knee, but years ago.    Can walk-  around [place]- if has cane or rolator.  Has done [employer] as well, but very tired by the end.    Pain Inventory Average Pain 5 Pain Right Now 5 My pain is intermittent, sharp, stabbing, tingling, and aching  In the last 24 hours, has pain interfered with the following? General activity 8 Relation with others 10 Enjoyment of life 8 What TIME of day is your pain at its worst? daytime Sleep (in general) Fair  Pain is worse with: walking, bending, sitting, standing, and some activites Pain improves with: rest, heat/ice, and medication Relief from Meds: 5  Family History  Problem Relation Age of Onset   Alzheimer's disease Mother    Stevens-Johnson syndrome Father    Autism Brother    Colon cancer Other    Colon polyps Neg Hx    Esophageal cancer Neg Hx    Rectal cancer Neg Hx    Stomach cancer Neg Hx    Social History   Socioeconomic History   Marital status: Married    Spouse name: Not on file   Number of  children: 1   Years of education: Not on file   Highest education level: Some college, no degree  Occupational History   Occupation: health and safety inspector  Tobacco Use   Smoking status: Never   Smokeless tobacco: Never  Vaping Use   Vaping status: Never Used  Substance and Sexual Activity   Alcohol use: Yes    Alcohol/week: 3.0 standard drinks of alcohol    Types: 3 Glasses of wine per week    Comment: occasional   Drug use: Not Currently    Types: Marijuana   Sexual activity: Yes    Partners: Female  Other Topics Concern   Not on file  Social History Narrative   Not on file   Social Drivers of Health   Tobacco Use: Low Risk (03/23/2024)   Patient History    Smoking Tobacco Use: Never    Smokeless Tobacco Use: Never    Passive Exposure: Not on file  Financial Resource Strain: Low Risk (02/28/2024)   Overall Financial Resource Strain (CARDIA)    Difficulty of Paying Living Expenses: Not hard at all  Food Insecurity: No Food Insecurity (02/28/2024)   Epic    Worried About Radiation Protection Practitioner of Food in the Last Year: Never true  Ran Out of Food in the Last Year: Never true  Transportation Needs: No Transportation Needs (02/28/2024)   Epic    Lack of Transportation (Medical): No    Lack of Transportation (Non-Medical): No  Physical Activity: Insufficiently Active (02/28/2024)   Exercise Vital Sign    Days of Exercise per Week: 3 days    Minutes of Exercise per Session: 30 min  Stress: No Stress Concern Present (02/28/2024)   [employer] of Occupational Health - Occupational Stress Questionnaire    Feeling of Stress: Only a little  Social Connections: Socially Integrated (02/28/2024)   Social Connection and Isolation Panel    Frequency of Communication with Friends and Family: More than three times a week    Frequency of Social Gatherings with Friends and Family: More than three times a week    Attends Religious Services: More than 4 times per year    Active Member of Clubs or  Organizations: Yes    Attends Banker Meetings: More than 4 times per year    Marital Status: Married  Depression (PHQ2-9): Low Risk (03/13/2024)   Depression (PHQ2-9)    PHQ-2 Score: 0  Alcohol Screen: Low Risk (02/28/2024)   Alcohol Screen    Last Alcohol Screening Score (AUDIT): 1  Housing: Low Risk (02/28/2024)   Epic    Unable to Pay for Housing in the Last Year: No    Number of Times Moved in the Last Year: 0    Homeless in the Last Year: No  Utilities: Not At Risk (12/07/2023)   Epic    Threatened with loss of utilities: No  Health Literacy: Not on file   Past Surgical History:  Procedure Laterality Date   ANKLE BONE SURGERY     APPLICATION OF INTRAOPERATIVE CT SCAN  12/07/2023   Procedure: APPLICATION OF INTRAOPERATIVE CT SCAN;  Surgeon: Claudene Penne ORN, MD;  Location: [facility];  Service: Neurosurgery;;   BACK SURGERY     COLLAR BONE SURGEY     COLONOSCOPY     FRACTURE SURGERY     HERNIA REPAIR     IR IVC FILTER PLMT / S&I /IMG GUID/MOD SED  12/12/2023   KNEE ARTHROSCOPY Left 2006   LAPAROSCOPIC APPENDECTOMY N/A 01/13/2021   Procedure: APPENDECTOMY LAPAROSCOPIC WITH LYSIS OF ADHESIONS;  Surgeon: Sheldon Standing, MD;  Location: [facility];  Service: General;  Laterality: N/A;   LUMBAR MICRODISCECTOMY     POLYPECTOMY     SKIN BIOPSY Left 08/11/2021   nodulocystic fat nercrosis   TOTAL KNEE ARTHROPLASTY Left 11/11/2020   Procedure: LEFT TOTAL KNEE ARTHROPLASTY;  Surgeon: Barbarann Oneil BROCKS, MD;  Location: [facility];  Service: Orthopedics;  Laterality: Left;   UMBILICAL HERNIA REPAIR     Past Surgical History:  Procedure Laterality Date   ANKLE BONE SURGERY     APPLICATION OF INTRAOPERATIVE CT SCAN  12/07/2023   Procedure: APPLICATION OF INTRAOPERATIVE CT SCAN;  Surgeon: Claudene Penne ORN, MD;  Location: [facility];  Service: Neurosurgery;;   BACK SURGERY     COLLAR BONE SURGEY     COLONOSCOPY     FRACTURE SURGERY     HERNIA REPAIR     IR IVC FILTER PLMT / S&I /IMG GUID/MOD  SED  12/12/2023   KNEE ARTHROSCOPY Left 2006   LAPAROSCOPIC APPENDECTOMY N/A 01/13/2021   Procedure: APPENDECTOMY LAPAROSCOPIC WITH LYSIS OF ADHESIONS;  Surgeon: Sheldon Standing, MD;  Location: [facility];  Service: General;  Laterality: N/A;   LUMBAR MICRODISCECTOMY  POLYPECTOMY     SKIN BIOPSY Left 08/11/2021   nodulocystic fat nercrosis   TOTAL KNEE ARTHROPLASTY Left 11/11/2020   Procedure: LEFT TOTAL KNEE ARTHROPLASTY;  Surgeon: Barbarann Oneil BROCKS, MD;  Location: [facility];  Service: Orthopedics;  Laterality: Left;   UMBILICAL HERNIA REPAIR     Past Medical History:  Diagnosis Date   Arthritis    knee, left   ED (erectile dysfunction)    Hyperlipidemia    Hypertension    Sleep apnea    CPAP - not using lately due to recall    BP (!) 147/96   Pulse (!) 104   Ht 5' 9 (1.753 m)   Wt 256 lb (116.1 kg)   SpO2 98%   BMI 37.80 kg/m   Opioid Risk Score:   Fall Risk Score:  `1  Depression screen [facility] 2/9     03/13/2024    9:02 AM 11/23/2023    2:36 PM 11/19/2022    1:31 PM 11/03/2021    8:24 AM 10/07/2020    8:35 AM 09/26/2018    8:20 AM 09/06/2017    8:29 AM  Depression screen PHQ 2/9  Decreased Interest 0 0 0 0 1 0 0  Down, Depressed, Hopeless 0 1 0 0 0    PHQ - 2 Score 0 1 0 0 1 0 0    Review of Systems  Musculoskeletal:  Positive for back pain and gait problem.       B/L thigh pain Left knee pain  Neurological:  Positive for weakness and numbness.  All other systems reviewed and are negative.      Objective:   Physical Exam  Awake, alert, appropriate, walking with cane, NAD Antalgic gait- favoring LLE and stiff legged MSK:  RLE- HF 4+/5; KE 4+/5; and KF 4+/5; DF 4+/5 and PF 5-/5 Same in LLE   Neuro: MAS of 1+ to 2- in hips and knees- 1+ in ankles B/L 3+ DTRs at patellae and 2+ at ankles 3-4 beats clonus B/L       Assessment & Plan:   Pt is a 59 yr old male with thoracic myelopathy. Status post T3 transpedicular decompression, T1-T4 PSF 12/07/2023 per Dr. Penne Sharps neurosurgery.He also has associated myelopathy, R proximal peroneal vein DVT- has IVC filter; also has : OSA, HTN,  Here for f/u on Thoracic myelopathy  Needs to get IVC filter removed- referral to Interventional radiology- for removal.  -It's OK to come off Eliquis  for IR IVC removal.   2. Will place referral for Dopplers of LE's/legs to determine if still has clots  3. Once Dopplers come back, can stop Eliquis - has been 6 months.   4. Con't baclofen - doesn't need refills- filled 03/13/24.   5. Strength exam has stabilized-  but still by end of day, tripping more and more situations where fatigues and cannot walk as far.    6. F/U in 3 months- single appt.   7. Discussed intimacy- not an issue. Might be worth checking with PCP to change Paxil  to something else-   I spent a total of   31  minutes on total care today- >50% coordination of care- due to d/w pt about Clots/DVTs, Pe's and removal of IVC filter- also about spasticity- decided to keep dose the same- also d/w pt about    "

## 2024-06-16 ENCOUNTER — Ambulatory Visit (HOSPITAL_COMMUNITY)
Admission: RE | Admit: 2024-06-16 | Discharge: 2024-06-16 | Disposition: A | Discharge status: Home / Self Care | Source: Ambulatory Visit | Attending: Physical Medicine and Rehabilitation | Admitting: Physical Medicine and Rehabilitation

## 2024-06-16 DIAGNOSIS — I82403 Acute embolism and thrombosis of unspecified deep veins of lower extremity, bilateral: Secondary | ICD-10-CM | POA: Insufficient documentation

## 2024-06-28 ENCOUNTER — Other Ambulatory Visit

## 2024-06-28 ENCOUNTER — Ambulatory Visit: Admitting: Orthopedic Surgery

## 2024-06-28 ENCOUNTER — Encounter: Payer: Self-pay | Admitting: Orthopedic Surgery

## 2024-06-28 DIAGNOSIS — M25562 Pain in left knee: Secondary | ICD-10-CM

## 2024-06-28 NOTE — Progress Notes (Signed)
 "  Office Visit Note   Patient: Corey [name]           Date of Birth: 04/06/1966           MRN: [id number] Visit Date: 06/28/2024 Requested by: Joyce Norleen BROCKS, MD [address] [place],  [place] [address] PCP: Joyce Norleen BROCKS, MD  Subjective: Chief Complaint  Patient presents with   Left Knee - Pain, Follow-up    HPI: Corey [name] is a 59 y.o. male who presents to the office reporting left knee pain.  Patient reports swelling off and on.  Patient states that he feels like his knee will give way at times.  Uses Voltaren and ice and heat.  He is able to go shopping at the grocery store.  He has a history of some middle back pain with myelopathy with surgery in July.  Patient underwent left total knee replacement in 2022..                ROS: All systems reviewed are negative as they relate to the chief complaint within the history of present illness.  Patient denies fevers or chills.  Assessment & Plan: Visit Diagnoses:  1. Left knee pain, unspecified chronicity     Plan: Impression is left knee stiffness with nothing really actionable at this time.  The knee has a flexion contracture which will be difficult to stretch out but it is not really rising to the level of any type of revision surgery.  Will see him back as needed.  Some of the symptoms could be related to his back issues as well.  Follow-Up Instructions: No follow-ups on file.   Orders:  Orders Placed This Encounter  Procedures   XR Knee 1-2 Views Left   No orders of the defined types were placed in this encounter.     Procedures: No procedures performed   Clinical Data: No additional findings.  Objective: Vital Signs: There were no vitals taken for this visit.  Physical Exam:  Constitutional: Patient appears well-developed HEENT:  Head: Normocephalic Eyes:EOM are normal Neck: Normal range of motion Cardiovascular: Normal rate Pulmonary/chest: Effort normal Neurologic: Patient is alert Skin:  Skin is warm Psychiatric: Patient has normal mood and affect  Ortho Exam: Ortho exam demonstrates range of motion at left knee 15-90.  Does have some decreased patella mobility but no effusion or warmth in the left knee.  Collaterals are stable to varus and valgus stress.  Pedal pulses intact.  Specialty Comments:  No specialty comments available.  Imaging: No results found.   PMFS History: Patient Active Problem List   Diagnosis Date Noted   Muscle spasticity 03/13/2024   Abnormality of gait 03/13/2024   Anxiety 01/12/2024   Chronic low back pain 01/12/2024   Acute embolism and thrombosis of unspecified deep veins of unspecified lower extremity (HCC) 01/12/2024   Coping style affecting medical condition 12/15/2023   Myelopathy concurrent with and due to spinal stenosis of thoracic region [facility]) 12/06/2023   Spinal stenosis 12/06/2023   Thoracic myelopathy 12/06/2023   Morbid obesity (HCC) 12/06/2023   Depression 12/06/2023   Radiculopathy, lumbar region 11/10/2023   Flexion contracture of knee, left 03/01/2023   Prediabetes 11/19/2022   Status post laparoscopic appendectomy 01/14/2021   H/O total knee replacement, left 11/11/2020   Leiomyoma 11/08/2020   Arthritis 09/26/2018   Onychomycosis 09/26/2018   Hyperlipidemia LDL goal <100 08/31/2016   OSA on CPAP 08/31/2016   Premature ejaculation 08/19/2011   Past Medical  History:  Diagnosis Date   Arthritis    knee, left   ED (erectile dysfunction)    Hyperlipidemia    Hypertension    Sleep apnea    CPAP - not using lately due to recall     Family History  Problem Relation Age of Onset   Alzheimer's disease Mother    Stevens-Johnson syndrome Father    Autism Brother    Colon cancer Other    Colon polyps Neg Hx    Esophageal cancer Neg Hx    Rectal cancer Neg Hx    Stomach cancer Neg Hx     Past Surgical History:  Procedure Laterality Date   ANKLE BONE SURGERY     APPLICATION OF INTRAOPERATIVE CT SCAN  12/07/2023    Procedure: APPLICATION OF INTRAOPERATIVE CT SCAN;  Surgeon: Claudene Penne ORN, MD;  Location: [facility];  Service: Neurosurgery;;   BACK SURGERY     COLLAR BONE SURGEY     COLONOSCOPY     FRACTURE SURGERY     HERNIA REPAIR     IR IVC FILTER PLMT / S&I /IMG GUID/MOD SED  12/12/2023   KNEE ARTHROSCOPY Left 2006   LAPAROSCOPIC APPENDECTOMY N/A 01/13/2021   Procedure: APPENDECTOMY LAPAROSCOPIC WITH LYSIS OF ADHESIONS;  Surgeon: Sheldon Standing, MD;  Location: [facility];  Service: General;  Laterality: N/A;   LUMBAR MICRODISCECTOMY     POLYPECTOMY     SKIN BIOPSY Left 08/11/2021   nodulocystic fat nercrosis   TOTAL KNEE ARTHROPLASTY Left 11/11/2020   Procedure: LEFT TOTAL KNEE ARTHROPLASTY;  Surgeon: Barbarann Oneil BROCKS, MD;  Location: [facility];  Service: Orthopedics;  Laterality: Left;   UMBILICAL HERNIA REPAIR     Social History   Occupational History   Occupation: health and safety inspector  Tobacco Use   Smoking status: Never   Smokeless tobacco: Never  Vaping Use   Vaping status: Never Used  Substance and Sexual Activity   Alcohol use: Yes    Alcohol/week: 3.0 standard drinks of alcohol    Types: 3 Glasses of wine per week    Comment: occasional   Drug use: Not Currently    Types: Marijuana   Sexual activity: Yes    Partners: Female        "

## 2024-09-11 ENCOUNTER — Encounter: Admitting: Physical Medicine and Rehabilitation

## 2024-11-27 ENCOUNTER — Encounter: Payer: Self-pay | Admitting: Family Medicine
